# Patient Record
Sex: Female | Born: 1985
Health system: Southern US, Community
[De-identification: ages and names within clinical notes are randomized; demographics above are authoritative.]

## PROBLEM LIST (undated history)

## (undated) DIAGNOSIS — I10 Essential (primary) hypertension: Secondary | ICD-10-CM

## (undated) DIAGNOSIS — Z9851 Tubal ligation status: Secondary | ICD-10-CM

## (undated) DIAGNOSIS — Z331 Pregnant state, incidental: Secondary | ICD-10-CM

## (undated) DIAGNOSIS — F32A Depression, unspecified: Secondary | ICD-10-CM

## (undated) DIAGNOSIS — J189 Pneumonia, unspecified organism: Secondary | ICD-10-CM

## (undated) DIAGNOSIS — L309 Dermatitis, unspecified: Secondary | ICD-10-CM

## (undated) DIAGNOSIS — N611 Abscess of the breast and nipple: Secondary | ICD-10-CM

## (undated) DIAGNOSIS — Z302 Encounter for sterilization: Secondary | ICD-10-CM

## (undated) DIAGNOSIS — Z8489 Family history of other specified conditions: Secondary | ICD-10-CM

## (undated) DIAGNOSIS — F329 Major depressive disorder, single episode, unspecified: Secondary | ICD-10-CM

## (undated) HISTORY — PX: INCISE AND DRAIN ABCESS: PRO64

## (undated) HISTORY — PX: TUBAL LIGATION: SHX77

## (undated) HISTORY — DX: Abscess of the breast and nipple: N61.1

---
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
## 2005-04-04 ENCOUNTER — Ambulatory Visit: Payer: Self-pay | Admitting: Family Medicine

## 2005-05-23 ENCOUNTER — Ambulatory Visit: Payer: Self-pay | Admitting: Family Medicine

## 2005-10-02 ENCOUNTER — Ambulatory Visit: Payer: Self-pay | Admitting: Family Medicine

## 2006-02-09 ENCOUNTER — Emergency Department (HOSPITAL_COMMUNITY): Admission: EM | Admit: 2006-02-09 | Discharge: 2006-02-10 | Payer: Self-pay | Admitting: Emergency Medicine

## 2006-03-01 DIAGNOSIS — L309 Dermatitis, unspecified: Secondary | ICD-10-CM

## 2006-03-01 DIAGNOSIS — J45909 Unspecified asthma, uncomplicated: Secondary | ICD-10-CM

## 2006-03-01 DIAGNOSIS — L2084 Intrinsic (allergic) eczema: Secondary | ICD-10-CM | POA: Insufficient documentation

## 2006-03-02 ENCOUNTER — Encounter (INDEPENDENT_AMBULATORY_CARE_PROVIDER_SITE_OTHER): Payer: Self-pay | Admitting: *Deleted

## 2006-04-04 ENCOUNTER — Ambulatory Visit: Payer: Self-pay | Admitting: Sports Medicine

## 2006-04-04 DIAGNOSIS — M2240 Chondromalacia patellae, unspecified knee: Secondary | ICD-10-CM

## 2006-04-04 DIAGNOSIS — M25569 Pain in unspecified knee: Secondary | ICD-10-CM | POA: Insufficient documentation

## 2006-07-04 ENCOUNTER — Emergency Department: Payer: Self-pay

## 2006-07-28 ENCOUNTER — Emergency Department: Payer: Self-pay

## 2006-10-24 ENCOUNTER — Telehealth (INDEPENDENT_AMBULATORY_CARE_PROVIDER_SITE_OTHER): Payer: Self-pay | Admitting: *Deleted

## 2006-12-14 ENCOUNTER — Telehealth: Payer: Self-pay | Admitting: *Deleted

## 2007-06-18 ENCOUNTER — Emergency Department (HOSPITAL_COMMUNITY): Admission: EM | Admit: 2007-06-18 | Discharge: 2007-06-18 | Payer: Self-pay | Admitting: Emergency Medicine

## 2007-07-19 ENCOUNTER — Telehealth: Payer: Self-pay | Admitting: Family Medicine

## 2007-08-05 ENCOUNTER — Ambulatory Visit: Payer: Self-pay | Admitting: Family Medicine

## 2008-08-22 ENCOUNTER — Emergency Department (HOSPITAL_COMMUNITY): Admission: EM | Admit: 2008-08-22 | Discharge: 2008-08-22 | Payer: Self-pay | Admitting: Emergency Medicine

## 2009-03-07 ENCOUNTER — Emergency Department (HOSPITAL_COMMUNITY): Admission: EM | Admit: 2009-03-07 | Discharge: 2009-03-07 | Payer: Self-pay | Admitting: Emergency Medicine

## 2009-09-01 ENCOUNTER — Encounter: Payer: Self-pay | Admitting: Family Medicine

## 2010-02-03 ENCOUNTER — Encounter: Payer: Self-pay | Admitting: *Deleted

## 2010-02-03 NOTE — Miscellaneous (Signed)
   Clinical Lists Changes  Problems: Changed problem from ASTHMA, UNSPECIFIED (ICD-493.90) to ASTHMA, INTERMITTENT (ICD-493.90) 

## 2010-03-21 ENCOUNTER — Ambulatory Visit (INDEPENDENT_AMBULATORY_CARE_PROVIDER_SITE_OTHER): Payer: Self-pay | Admitting: Family Medicine

## 2010-03-21 ENCOUNTER — Encounter: Payer: Self-pay | Admitting: Family Medicine

## 2010-03-21 DIAGNOSIS — J45909 Unspecified asthma, uncomplicated: Secondary | ICD-10-CM

## 2010-03-21 MED ORDER — ALBUTEROL SULFATE HFA 108 (90 BASE) MCG/ACT IN AERS: 2.0000 | INHALATION_SPRAY | Freq: Four times a day (QID) | RESPIRATORY_TRACT | Status: DC | PRN

## 2010-03-21 NOTE — Patient Instructions (Addendum)
If you are using your inhaler every day you should call us for a prescription for a controller You need a Pap smear to check for cervical cancer.  Often hospitals have free screenings You can lower your risk of heart disease by controlling your weight and with regular exercise

## 2010-03-21 NOTE — Assessment & Plan Note (Signed)
Currently seems to be mild intermittent,  Use rescue inhaler prn.   Discussed when might need prevention meds

## 2010-03-21 NOTE — Progress Notes (Signed)
  Subjective:    Patient ID: [name] [name], female    DOB: 03/12/1985, 25 y.o.   MRN: [id number]  HPI  Asthma Using Primatene mist a few times a week.  Has topped smoking.  Symptoms are mainly in early AM.  No exercise intolerance or problems sleeping No edema   Review of Systems see above      Objective:   Physical Exam   Lungs:  Normal respiratory effort, chest expands symmetrically. Lungs are clear to auscultation, no crackles or wheezes. Heart:  Normal rate and regular rhythm. S1 and S2 normal without gallop, murmur, click, rub or other extra sounds Extremities:  No cyanosis, edema, or deformity noted with good range of motion of all major joints.       Assessment & Plan:

## 2010-04-09 LAB — POCT I-STAT, CHEM 8
BUN: <3 mg/dL — ABNORMAL LOW (ref 6–23)
Chloride: 103 mEq/L (ref 96–112)
Creatinine, Ser: 0.7 mg/dL (ref 0.4–1.2)
Sodium: 139 mEq/L (ref 135–145)
TCO2: 24 mmol/L (ref 0–100)

## 2010-04-09 LAB — URINALYSIS, ROUTINE W REFLEX MICROSCOPIC
Glucose, UA: NEGATIVE mg/dL
Ketones, ur: NEGATIVE mg/dL
Specific Gravity, Urine: 1.011 (ref 1.005–1.030)
pH: 7 (ref 5.0–8.0)

## 2010-04-09 LAB — URINE MICROSCOPIC-ADD ON

## 2010-05-20 NOTE — Discharge Summary (Signed)
[name] [name], [name] [name]              ACCOUNT NO.:  [id number]   MEDICAL RECORD NO.:  [id number]          PATIENT TYPE:  INP   LOCATION:  5707                         FACILITY:  [facility]   PHYSICIAN:  Santiago Bumpers. Hensel, M.D.DATE OF BIRTH:  Oct 23, 1985   DATE OF ADMISSION:  11/19/2004  DATE OF DISCHARGE:  11/21/2004                                 DISCHARGE SUMMARY   DISCHARGE DIAGNOSES:  1.  Community acquired pneumonia.  2.  Asthma.   DISCHARGE MEDICATIONS:  1.  Avelox 400 mg one tablet p.o. daily for 10 days.  2.  Prednisone 50 mg one tablet p.o. daily for 6 days.  3.  Albuterol metered dose inhaler 2 puffs every 4 hours for two days, then      two puffs every 6 hours for one day and then two puffs q.4h. PRN.  4.  Tussionex 5 mL q.12h.  5.  Motrin 600 mg one tablet p.o. q.6h. PRN.  6.  Singulair 10 mg one tablet p.o. daily.  7.  Flovent 110 mcg/spray, one puff b.i.d.   FOLLOW UP:  The patient has a follow up appointment with Dr. Deirdre Priest of  [facility].   BRIEF HISTORY, PHYSICAL EXAMINATION AND LABORATORY DATA:  On admission, 25-  year-old white female with history of asthma, atopic dermatitis who came to  the emergency department complaining of increased shortness of breath/work  of breathing and wheezing.  The patient stated that she started with upper  respiratory tract symptoms, (sore throat, runny nose) on November 15, 2004.  By November 16, 2004 the patient was requiring albuterol every 2 to 3 hours  with no improvement of shortness of breath and wheezing.  The patient went  to Urgent Care and received respiratory treatments, oral prednisone was  prescribed and she was discharged hoe.  24 hours later, symptoms worsened.  The patient returned to Urgent Care and a chest x-ray showed right lower  lobe pneumonia.  The patient was placed on Cipro. On the day of admission  the patient had not improved and was complaining of increased shortness of  breath/work  of breathing.  The patient stated on the last admission for  acute exacerbation of asthma was approximately 5 years ago.  The patient has  no medical insurance until two months ago that she qualify for [employer] [facility].  For the last 10 months the patient has not been using steroid inhaler  secondary to not being able to afford it.  After she qualified for [employer], the patient was prescribed Singulair but not prescribed Advair which,  per patient and the patient's mother, was the best treatment to control this  patient's asthma.   On physical examination on admission the patient was afebrile, pulse 122,  respiratory rate 28, blood pressure 124/72, oxygen saturation 91% on room  air.  The patient was in no acute distress.  On respiratory examination the  patient presented decreased breath sounds on right lower lobe and left lower  lobe (right greater than left), bibasilar crackles, no wheezing, increased  expiratory time.  Cardiovascular tachycardic.  The rest of  the patient's  physical examination was unremarkable   Laboratory data on admission showed chest x-ray with worsening infiltrates  on right lower lobes and bilateral upper lobes.  White blood cell count was  20.8, hemoglobin 14.6, hematocrit 42.5, platelet count 365,000.  Basic  metabolic profile showed sodium 139, potassium 4.1, chloride 107, pCO2 28,  BUN 8, creatinine 0.8, glucose 79.   HOSPITAL COURSE:  PROBLEM #1:  PNEUMONIA:  Cipro was discontinued.  The  patient was started on Rocephin 1 gram intravenously q.24h. and Zithromax  500 mg intravenously x1, then 250 mg daily for 4 days.  Throughout the rest  of her hospitalization her shortness of breath and increased work of  breathing improved remarkably.  Oxygen saturations were stable ranging from  92% to 94% on room air.  By the day of discharge the patient was walking in  the hall.  Oxygen saturations were checked and peak flow's before and after  respiratory  treatments were recorded.  By November 21, 2004 in the morning  the patient is clinically stable.  Peak flow's will be checked before and  after treatments during the afternoon and the patient will walk in the hall  with checks of oxygen saturation's.  If the patient is stable, discharge  home on Avelox 400 mg one tablet p.o. daily to complete a course of  antibiotics of 10 days.  The patient has a follow up appointment with Dr.  Deirdre Priest on December 01, 2004.  A repeat chest x-ray will be performed in  four to six weeks for follow up.   PROBLEM #2:  ASTHMA, MODERATELY PERSISTENT:  The patient was treated with  albuterol q.4h./q.2h. PRN nebulization's.  The patient received 24 hours of  Solu-Medrol, 60 mg intravenously q.6h. for 24 hours, then intravenously  steroids were discontinued and the patient was started on prednisone 40 mg  p.o. daily. The patient is to complete 10 day course of steroids.  Her  shortness of breath and work of breathing improved remarkably.  On physical  examination still wheezes are noticed.  The patient's oxygen saturation's  were stable.  Peak flow's before and after respiratory treatments were  ordered and the patient will walk in the halls with checks of oxygen  saturation's.  If Peak flow shows improvement and oxygen saturation's are  stable, the patient will be discharged home this afternoon.  The patient  agrees with this plan.  The patient received asthma teaching.  The patient  has a follow up appointment with Dr. Deirdre Priest of [facility].   PROBLEM #3:  SOCIAL:  The patient did not have insurance until finally she  qualified for [employer] two months ago.  Per the patient and the  patient's mother, they state that Flovent or Advair help very well to  control her asthma, but the patient was unable to afford it.  Since the  patient qualified for [employer] [facility] she has not been prescribed either of these medications.  I have ordered  asthma teaching and Flovent 110  mcg/spray.  The patient is going to be able to take home a sample of  Flovent; Social worker for discharge medications.  This issue to be followed  by primary physician.   NOTE:  This patient's asthma is triggered by cold weather.  In general, the  patient has acute exacerbation of  her asthma during the month's of  November, December and January.  Given the fact that this patient is unable  to  afford her medications, she is not being prescribed Flovent or Advair at  [facility] [place], she is not able to afford these medications.  This has been  since the last 10 months.  For the last 10 months the patient has had two  visits to the Urgent Care secondary to asthma exacerbation's.  The patient  left the hospital with a sample of Flovent.  This issue is to be followed by  her primary physician's.      Henri Medal, MD    ______________________________  Santiago Bumpers Leveda Anna, [name]    Lendon Colonel  D:  11/21/2004  T:  11/21/2004  Job:  [id number]   cc:   Pearlean Brownie, [name]  Fax: [phone]

## 2010-06-10 ENCOUNTER — Emergency Department (HOSPITAL_COMMUNITY)
Admission: EM | Admit: 2010-06-10 | Discharge: 2010-06-11 | Disposition: A | Discharge status: Home / Self Care | Payer: Medicaid Other | Attending: Emergency Medicine | Admitting: Emergency Medicine

## 2010-06-10 DIAGNOSIS — J45909 Unspecified asthma, uncomplicated: Secondary | ICD-10-CM | POA: Insufficient documentation

## 2010-06-10 DIAGNOSIS — R05 Cough: Secondary | ICD-10-CM | POA: Insufficient documentation

## 2010-06-10 DIAGNOSIS — O99891 Other specified diseases and conditions complicating pregnancy: Secondary | ICD-10-CM | POA: Insufficient documentation

## 2010-06-10 DIAGNOSIS — R059 Cough, unspecified: Secondary | ICD-10-CM | POA: Insufficient documentation

## 2010-06-10 DIAGNOSIS — R000 Tachycardia, unspecified: Secondary | ICD-10-CM | POA: Insufficient documentation

## 2010-06-10 LAB — POCT PREGNANCY, URINE: Preg Test, Ur: POSITIVE

## 2010-06-11 ENCOUNTER — Emergency Department (HOSPITAL_COMMUNITY)
Admission: EM | Admit: 2010-06-11 | Discharge: 2010-06-12 | Disposition: A | Discharge status: Home / Self Care | Payer: Medicaid Other | Attending: Emergency Medicine | Admitting: Emergency Medicine

## 2010-06-11 DIAGNOSIS — J45909 Unspecified asthma, uncomplicated: Secondary | ICD-10-CM | POA: Insufficient documentation

## 2010-06-11 DIAGNOSIS — R0609 Other forms of dyspnea: Secondary | ICD-10-CM | POA: Insufficient documentation

## 2010-06-11 DIAGNOSIS — J40 Bronchitis, not specified as acute or chronic: Secondary | ICD-10-CM | POA: Insufficient documentation

## 2010-06-11 DIAGNOSIS — R0989 Other specified symptoms and signs involving the circulatory and respiratory systems: Secondary | ICD-10-CM | POA: Insufficient documentation

## 2010-06-11 DIAGNOSIS — R05 Cough: Secondary | ICD-10-CM | POA: Insufficient documentation

## 2010-06-11 DIAGNOSIS — R079 Chest pain, unspecified: Secondary | ICD-10-CM | POA: Insufficient documentation

## 2010-06-11 DIAGNOSIS — O9989 Other specified diseases and conditions complicating pregnancy, childbirth and the puerperium: Secondary | ICD-10-CM | POA: Insufficient documentation

## 2010-06-11 DIAGNOSIS — R0682 Tachypnea, not elsewhere classified: Secondary | ICD-10-CM | POA: Insufficient documentation

## 2010-06-11 DIAGNOSIS — R059 Cough, unspecified: Secondary | ICD-10-CM | POA: Insufficient documentation

## 2010-06-11 LAB — URINALYSIS, ROUTINE W REFLEX MICROSCOPIC
Ketones, ur: NEGATIVE mg/dL
Leukocytes, UA: NEGATIVE
Nitrite: NEGATIVE
Protein, ur: NEGATIVE mg/dL
Urobilinogen, UA: 1 mg/dL (ref 0.0–1.0)
pH: 6 (ref 5.0–8.0)

## 2010-08-09 LAB — ABO/RH

## 2010-08-09 LAB — HIV ANTIBODY (ROUTINE TESTING W REFLEX): HIV: NONREACTIVE

## 2010-08-09 LAB — GC/CHLAMYDIA PROBE AMP, GENITAL: Gonorrhea: NEGATIVE

## 2010-08-09 LAB — STREP B DNA PROBE: GBS: POSITIVE

## 2010-08-09 LAB — RPR: RPR: NONREACTIVE

## 2010-08-09 LAB — ANTIBODY SCREEN: Antibody Screen: NEGATIVE

## 2010-08-10 ENCOUNTER — Other Ambulatory Visit (HOSPITAL_COMMUNITY): Payer: Self-pay | Admitting: Obstetrics and Gynecology

## 2010-08-10 DIAGNOSIS — Z3682 Encounter for antenatal screening for nuchal translucency: Secondary | ICD-10-CM

## 2010-08-15 ENCOUNTER — Ambulatory Visit (HOSPITAL_COMMUNITY)
Admission: RE | Admit: 2010-08-15 | Discharge: 2010-08-15 | Disposition: A | Discharge status: Home / Self Care | Payer: Medicaid Other | Source: Ambulatory Visit | Attending: Obstetrics and Gynecology | Admitting: Obstetrics and Gynecology

## 2010-08-15 ENCOUNTER — Encounter (HOSPITAL_COMMUNITY): Payer: Self-pay

## 2010-08-15 DIAGNOSIS — Z3682 Encounter for antenatal screening for nuchal translucency: Secondary | ICD-10-CM

## 2010-08-15 DIAGNOSIS — O351XX0 Maternal care for (suspected) chromosomal abnormality in fetus, not applicable or unspecified: Secondary | ICD-10-CM | POA: Insufficient documentation

## 2010-08-15 DIAGNOSIS — Z3689 Encounter for other specified antenatal screening: Secondary | ICD-10-CM | POA: Insufficient documentation

## 2010-08-15 DIAGNOSIS — O3510X0 Maternal care for (suspected) chromosomal abnormality in fetus, unspecified, not applicable or unspecified: Secondary | ICD-10-CM | POA: Insufficient documentation

## 2010-08-31 ENCOUNTER — Other Ambulatory Visit: Payer: Self-pay | Admitting: Maternal and Fetal Medicine

## 2010-08-31 ENCOUNTER — Ambulatory Visit (HOSPITAL_COMMUNITY)
Admission: RE | Admit: 2010-08-31 | Discharge: 2010-08-31 | Disposition: A | Discharge status: Home / Self Care | Payer: Medicaid Other | Source: Ambulatory Visit | Attending: Obstetrics and Gynecology | Admitting: Obstetrics and Gynecology

## 2010-08-31 DIAGNOSIS — O3510X0 Maternal care for (suspected) chromosomal abnormality in fetus, unspecified, not applicable or unspecified: Secondary | ICD-10-CM | POA: Insufficient documentation

## 2010-08-31 DIAGNOSIS — O351XX0 Maternal care for (suspected) chromosomal abnormality in fetus, not applicable or unspecified: Secondary | ICD-10-CM | POA: Insufficient documentation

## 2010-10-05 ENCOUNTER — Ambulatory Visit (INDEPENDENT_AMBULATORY_CARE_PROVIDER_SITE_OTHER): Payer: Medicaid Other | Admitting: Family Medicine

## 2010-10-05 ENCOUNTER — Encounter: Payer: Self-pay | Admitting: Family Medicine

## 2010-10-05 ENCOUNTER — Telehealth: Payer: Self-pay | Admitting: Family Medicine

## 2010-10-05 ENCOUNTER — Other Ambulatory Visit: Payer: Self-pay | Admitting: Family Medicine

## 2010-10-05 DIAGNOSIS — J45909 Unspecified asthma, uncomplicated: Secondary | ICD-10-CM

## 2010-10-05 MED ORDER — ALBUTEROL 90 MCG/ACT IN AERS: 2.0000 | INHALATION_SPRAY | Freq: Four times a day (QID) | RESPIRATORY_TRACT | Status: DC | PRN

## 2010-10-05 MED ORDER — PREDNISONE 20 MG PO TABS: 40.0000 mg | ORAL_TABLET | Freq: Every day | ORAL | Status: AC

## 2010-10-05 NOTE — Telephone Encounter (Signed)
Pt's cough/asthma was getting worse so she called back and is coming in at 1:30.

## 2010-10-05 NOTE — Telephone Encounter (Signed)
Refill request

## 2010-10-05 NOTE — Patient Instructions (Signed)
Great job on quitting smoking!!!!!! Benadryl or claritin or zyrtec are safe allergy medicines to usei n pregnancy  Asthma, Adult Asthma is caused by narrowing of the air passages in the lungs. It may be triggered by pollen, dust, animal dander, molds, some foods, respiratory infections, exposure to smoke, exercise, emotional stress or other allergens (things that cause allergic reactions or allergies). Repeat attacks are common. HOME CARE INSTRUCTIONS  Use prescription medications as ordered by your caregiver.   Avoid pollen, dust, animal dander, molds, smoke and other things that cause attacks at home and at work.   You may have fewer attacks if you decrease dust in your home. Electrostatic air cleaners may help.   It may help to replace your pillows or mattress with materials less likely to cause allergies.   Talk to your caregiver about an action plan for managing asthma attacks at home, including, the use of a peak flow meter which measures the severity of your asthma attack. An action plan can help minimize or stop the attack without having to seek medical care.   If you are not on a fluid restriction, drink 8 to 10 glasses of water each day.   Always have a plan prepared for seeking medical attention, including, calling your physician, accessing local emergency care, and calling 911 (in the [place] for a severe attack.   Discuss possible exercise routines with your caregiver.   If animal dander is the cause of asthma, you may need to get rid of pets.  SEEK MEDICAL CARE IF:  You have wheezing and shortness of breath even if taking medicine to prevent attacks.   An oral temperature above 101 develops.   You have muscle aches, chest pain or thickening of sputum.   Your sputum changes from clear or white to yellow, green, gray or bloody.   You have any problems that may be related to the medicine you are taking (such as a rash, itching, swelling or trouble breathing).  SEEK  IMMEDIATE MEDICAL CARE IF:  Your usual medicines do not stop your wheezing or there is increased coughing and/or shortness of breath.   You have increased difficulty breathing.   You have an oral temperature above 101, not controlled by medicine.  MAKE SURE YOU:  Understand these instructions.   Will watch your condition.   Will get help right away if you are not doing well or get worse.  Document Released: 12/19/2004 Document Re-Released: 01/10/2009 [facility] Patient Information 2011 [place], [place].

## 2010-10-05 NOTE — Progress Notes (Signed)
  Subjective:    Patient ID: Dominik Lauricella, female    DOB: 04/28/1985, 25 y.o.   MRN: [id number]  HPI  25 yo @ 20 weeks of gestation here for work in appt for 1-2 days of dyspnea and wheezing.  Has asthma, intermittent.  This is her typical season for flares.  Last one 5 months ago- got prednisone.  No intubation, hospitalizations.  Has not needed any albuterol in between.  For past 1-2 days,has been usuing albuterol every few hours.  No fever, no sputum.  + cough, rhinorrhea, dyspnea. Review of Systems     Objective:   Physical Exam GEN: NAD, gravid. CV:  Regular Rate & Rhythm, no murmur Respiratory:  Normal work of breathing, wheezes throughout.  Fair air movement. HEENT: [place]/AT. EOMI, PERRLA, no conjunctival injection or scleral icterus.  Bilateral tympanic membranes intact without erythema or effusion.  .  Nares without edema or rhinorrhea.  Oropharynx is without erythema or exudates.  No anterior or posterior cervical lymphadenopathy.         Assessment & Plan:

## 2010-10-05 NOTE — Assessment & Plan Note (Signed)
Will refill albuterol, 5 day course of prednisone.   Pregnancy has not had much impact on her asthma thus far.  Discussed need for return if no improvement from this acute episode (likely triggered by viral uri) or if notes increase in baseline dyspnea and needing to use albuterol more frequently.

## 2010-10-05 NOTE — Telephone Encounter (Signed)
Is in need of her inhaler (albuterol) and when she got it last time, she could only afford 1 weeks worth and now they are telling her that she will need a new rx.  She is having hard time breathing today and is asking for it asap. [employer], [address] [employer]

## 2010-10-06 ENCOUNTER — Ambulatory Visit (INDEPENDENT_AMBULATORY_CARE_PROVIDER_SITE_OTHER): Payer: Medicaid Other | Admitting: Family Medicine

## 2010-10-06 ENCOUNTER — Telehealth: Payer: Self-pay | Admitting: Family Medicine

## 2010-10-06 VITALS — BP 120/76 | HR 110 | Temp 98.7°F | Wt 158.6 lb

## 2010-10-06 DIAGNOSIS — J45901 Unspecified asthma with (acute) exacerbation: Secondary | ICD-10-CM | POA: Insufficient documentation

## 2010-10-06 MED ORDER — CETIRIZINE HCL 10 MG PO TABS: 10.0000 mg | ORAL_TABLET | Freq: Every day | ORAL | Status: DC

## 2010-10-06 NOTE — Assessment & Plan Note (Addendum)
Although patient states that her symptoms do not seem to be improving, lungs clearer on physical exam. Patient not in any acute distress. Pulse ox 98% on room air. Good air movement to bases bilateral-with a few scattered wheezes on expiration. Patient to continue prednisone as directed, patient to use albuterol as directed-every 2-4 hours for the first 24 hours, then every 6 hours for the next 24 hours, then as needed. Patient also to take Zyrtec daily. Heart rate 110 today-this is most likely from albuterol. Patient to drink lots of fluids. Patient to return tomorrow for recheck before the weekend.  Discuss with patient that sometimes asthma is worsened while pregnant. We'll need to watch patient closely. Patient states understanding of plan.

## 2010-10-06 NOTE — Telephone Encounter (Signed)
Advised patient to come to office now.

## 2010-10-06 NOTE — Progress Notes (Addendum)
  Subjective:    Patient ID: [name] [name], female    DOB: 1985/03/21, 25 y.o.   MRN: [id number]  HPI Asthma exacerbation: Patient states she was seen yesterday for asthma exacerbation, she states she is not feeling much better. Came in today for recheck. Patient states she is using albuterol inhaler every 2 hours. Was waking up at night every 2-3 hours to use albuterol inhaler. Symptoms seem to be worse at night. Also took a dose of Benadryl last night and this helps nasal congestion. Took first dose of prednisone yesterday at 1:30 PM, therefore has had less than 24 hours since first dose of prednisone. No fever. Runny nose improved. No nausea vomiting. Positive sore throat. No ear pain. Positive body aches.  Review of Systems As per above. Baby moving well. No vaginal bleeding. No abdominal pain    Objective:   Physical Exam  Constitutional: She is oriented to person, place, and time. She appears well-developed and well-nourished. No distress.  HENT:  Head: Normocephalic and atraumatic.  Eyes: Right eye exhibits no discharge. Left eye exhibits no discharge.  Pulmonary/Chest: Effort normal. No respiratory distress. She has wheezes.       Good air movement bilateral, a few scattered wheezes on expiration, but good air movement to bases.  Musculoskeletal: She exhibits no edema.  Neurological: She is alert and oriented to person, place, and time.  Skin: No rash noted.  Psychiatric: She has a normal mood and affect. Her behavior is normal.          Assessment & Plan:

## 2010-10-06 NOTE — Telephone Encounter (Signed)
Pt was here yesterday and states that her condition is worse.  Wants to know what she should do.

## 2010-10-06 NOTE — Patient Instructions (Addendum)
Take zyrtec daily- you can by over the counter. Albuterol every 2-4 hours for the next 24 hours, then every 6 hours for the next 24 hours, then as needed.  Continue to use prednisone. Drink lots of fluids.  Come back tomorrow for recheck

## 2010-10-07 ENCOUNTER — Ambulatory Visit: Payer: Medicaid Other | Admitting: Family Medicine

## 2010-10-08 ENCOUNTER — Telehealth: Payer: Self-pay | Admitting: Family Medicine

## 2010-10-08 ENCOUNTER — Inpatient Hospital Stay (HOSPITAL_COMMUNITY)
Admission: RE | Admit: 2010-10-08 | Discharge: 2010-10-08 | Disposition: A | Discharge status: Home / Self Care | Payer: Medicaid Other | Source: Ambulatory Visit | Attending: Family Medicine | Admitting: Family Medicine

## 2010-10-08 NOTE — Telephone Encounter (Signed)
Called because not feeling better from recent asthma exacerbation. Total 6 days of symptoms still feeling quite SOB when walking across room. Using albuterol q2 hours. Finished prednisone 2 days ago. Nonproductive cough, wheezing. Denies fever, CP, sputum. Last hospitalization for asthma was 10 years ago. Discussed options of being evaluated at [facility] or waiting for work in appointment Monday morning, but since I am unable to evaluate her lung exam, I advised being seen. May need a prolonged course of prednisone vs antibiotic depending on exam (?bronchitis, atypical pna?). [facility]. Advised her to follow up on Monday even if she goes to urgent care today.

## 2010-11-07 ENCOUNTER — Emergency Department (INDEPENDENT_AMBULATORY_CARE_PROVIDER_SITE_OTHER)
Admission: EM | Admit: 2010-11-07 | Discharge: 2010-11-07 | Disposition: A | Discharge status: Home / Self Care | Payer: Medicaid Other | Source: Home / Self Care | Attending: Emergency Medicine | Admitting: Emergency Medicine

## 2010-11-07 ENCOUNTER — Encounter (HOSPITAL_COMMUNITY): Payer: Self-pay

## 2010-11-07 DIAGNOSIS — L309 Dermatitis, unspecified: Secondary | ICD-10-CM

## 2010-11-07 DIAGNOSIS — L259 Unspecified contact dermatitis, unspecified cause: Secondary | ICD-10-CM

## 2010-11-07 DIAGNOSIS — H01139 Eczematous dermatitis of unspecified eye, unspecified eyelid: Secondary | ICD-10-CM

## 2010-11-07 HISTORY — DX: Pregnant state, incidental: Z33.1

## 2010-11-07 MED ORDER — PREDNISONE (PAK) 10 MG PO TABS: 10.0000 mg | ORAL_TABLET | ORAL | Status: DC

## 2010-11-07 MED ORDER — PREDNISONE (PAK) 10 MG PO TABS: 10.0000 mg | ORAL_TABLET | Freq: Four times a day (QID) | ORAL | Status: DC

## 2010-11-07 MED ORDER — PREDNISONE (PAK) 10 MG PO TABS: 10.0000 mg | ORAL_TABLET | Freq: Three times a day (TID) | ORAL | Status: DC

## 2010-11-07 MED ORDER — TRIAMCINOLONE ACETONIDE 0.1 % EX CREA: TOPICAL_CREAM | Freq: Two times a day (BID) | CUTANEOUS | Status: DC

## 2010-11-07 MED ORDER — PREDNISONE (PAK) 10 MG PO TABS: 20.0000 mg | ORAL_TABLET | Freq: Every evening | ORAL | Status: DC

## 2010-11-07 MED ORDER — PREDNISONE 10 MG PO TABS: 20.0000 mg | ORAL_TABLET | Freq: Every day | ORAL | Status: AC

## 2010-11-07 MED ORDER — PREDNISONE 20 MG PO TABS: 20.0000 mg | ORAL_TABLET | Freq: Two times a day (BID) | ORAL | Status: DC

## 2010-11-07 MED ORDER — PREDNISONE (PAK) 10 MG PO TABS: 20.0000 mg | ORAL_TABLET | Freq: Every morning | ORAL | Status: DC

## 2010-11-07 NOTE — ED Provider Notes (Addendum)
History     CSN: [id number] Arrival date & time: 11/07/2010  3:01 PM   First MD Initiated Contact with Patient 11/07/10 1608      Chief Complaint  Patient presents with  . Rash    (Consider location/radiation/quality/duration/timing/severity/associated sxs/prior treatment) Patient is a 25 y.o. female presenting with rash.  Rash  The current episode started more than 1 week ago. The problem has not changed since onset.The problem is associated with an unknown factor. There has been no fever. The rash is present on the face, right arm and left arm. The patient is experiencing no pain. The pain has been worsening since onset. Associated symptoms include itching. Pertinent negatives include no blisters. She has tried antihistamines for the symptoms. The treatment provided no relief.    Past Medical History  Diagnosis Date  . Asthma   . Pregnant state, incidental     History reviewed. No pertinent past surgical history.  Family History  Problem Relation Age of Onset  . Healthy Mother   . Healthy Father     History  Substance Use Topics  . Smoking status: Former Games developer  . Smokeless tobacco: Not on file  . Alcohol Use: No    OB History    Grav Para Term Preterm Abortions TAB SAB Ect Mult Living   1               Review of Systems  Constitutional: Negative for activity change.  HENT: Negative for sore throat.   Eyes: Positive for redness and itching.  Respiratory: Negative for cough and shortness of breath.   Skin: Positive for itching and rash.    Allergies  Review of patient's allergies indicates no known allergies.  Home Medications   Current Outpatient Rx  Name Route Sig Dispense Refill  . ALBUTEROL 90 MCG/ACT IN AERS Inhalation Inhale 2 puffs into the lungs every 6 (six) hours as needed for wheezing. 17 g 2  . CETIRIZINE HCL 10 MG PO TABS Oral Take 1 tablet (10 mg total) by mouth daily. 30 tablet   . PREDNISONE 10 MG PO TABS Oral Take 2 tablets (20 mg  total) by mouth daily. 15 tablet 0  . PRENATAL VITAMINS (DIS) PO TABS Oral Take by mouth.      . TRIAMCINOLONE ACETONIDE 0.1 % EX CREA Topical Apply topically 2 (two) times daily. 30 g 0    BP 130/59  Pulse 102  Temp(Src) 98.7 F (37.1 C) (Oral)  Resp 16  SpO2 99%  LMP 04/26/2010  Physical Exam  Skin: Skin is dry. Rash noted. No abrasion noted. Rash is papular and urticarial. There is erythema.       ED Course  Procedures (including critical care time)  Labs Reviewed - No data to display No results found.   1. Eczema of eyelid   2. Eczematous dermatitis       MDM  Marked eczema- patioent with history and predominanatly periorbital rash        Jimmie Molly 11/07/10 1645  Jakelin Taussig 11/07/10 1648

## 2010-11-07 NOTE — ED Notes (Signed)
C/o rash inner lip for 1 week, now has rash legs, face; c/o itching all over, minimal relief w benadryl ; [redacted] wk PREGNANT

## 2010-11-19 ENCOUNTER — Other Ambulatory Visit: Payer: Self-pay | Admitting: Family Medicine

## 2010-11-21 NOTE — Telephone Encounter (Signed)
Refill request

## 2010-11-30 ENCOUNTER — Ambulatory Visit (INDEPENDENT_AMBULATORY_CARE_PROVIDER_SITE_OTHER): Payer: Medicaid Other | Admitting: Family Medicine

## 2010-11-30 ENCOUNTER — Telehealth: Payer: Self-pay | Admitting: *Deleted

## 2010-11-30 ENCOUNTER — Encounter: Payer: Self-pay | Admitting: Family Medicine

## 2010-11-30 VITALS — BP 126/75 | HR 125 | Temp 98.4°F | Ht 60.0 in | Wt 167.8 lb

## 2010-11-30 DIAGNOSIS — J45901 Unspecified asthma with (acute) exacerbation: Secondary | ICD-10-CM

## 2010-11-30 MED ORDER — BUDESONIDE 180 MCG/ACT IN AEPB: 1.0000 | INHALATION_SPRAY | Freq: Two times a day (BID) | RESPIRATORY_TRACT | Status: DC

## 2010-11-30 NOTE — Telephone Encounter (Signed)
Ms. Gracelyn Nurse is calling about whether she will be able to get her medication today.

## 2010-11-30 NOTE — Telephone Encounter (Signed)
PA required for [employer]. Form filled out by MD and faxed to Medicaid.

## 2010-11-30 NOTE — Progress Notes (Signed)
  Subjective:    Patient ID: [name] [name], female    DOB: 03-30-1985, 25 y.o.   MRN: [id number]  HPI  URI Asthma For last week dry cough, runny nose, wheezing and mild shortness of breath.  No fever or leg swelling. No pregnancy problems so far.  Using albuterol qid at least.  Had a prednisone burst last time in ER about 2 weeks ago.  Has not been on inhaled steroid for long time.     Review of Symptoms - see HPI  PMH - Smoking status noted.   No recent hospitilizations for asthma    Review of Systems     Objective:   Physical Exam  Alert no acute distress Lungs:  Normal respiratory effort, chest expands symmetrically bilateral mild wheeze No dullness Heart - Regular rate and rhythm.  No murmurs, gallops or rubs.    Abdomen - gravid Extremities:  No cyanosis, edema, or deformity noted with good range of motion of all major joints.   Skin - dry no rash       Assessment & Plan:

## 2010-11-30 NOTE — Patient Instructions (Signed)
Use the new inhaler 2 puffs twice daily with a spacer for 1 week then 1 puff twice daily Wash mouth well after using  If are feeling worse then call   If not better in one week call  Use the albuterol as needed

## 2010-11-30 NOTE — Assessment & Plan Note (Signed)
Recurrent but not severe.  Will start inhaled steroid at high dose then resume normal dose. Budesonide is pregnancy category B.   Watch closely for worsening.

## 2010-11-30 NOTE — Telephone Encounter (Signed)
Advised that it will probably be a couple of days before we hear back from [facility].  Possibly tomorrow. She asks if Dr. Deirdre Priest will give her prednisone to get her thru until this is approved.  Will check with Dr. Deirdre Priest tomorrow AM.

## 2010-12-01 MED ORDER — PREDNISONE 10 MG PO TABS: 30.0000 mg | ORAL_TABLET | Freq: Every day | ORAL | Status: AC

## 2010-12-01 NOTE — Telephone Encounter (Signed)
LMOVM for a callback. [name] [name], [name] [name]

## 2010-12-01 NOTE — Telephone Encounter (Signed)
Risk of worsening asthma outweighs risk of prednisone in pregnancy so sent in po prednisone  Please call her and let her know to start the oral prednisone and get the inhaler asap  Thanks  LC

## 2010-12-01 NOTE — Telephone Encounter (Signed)
Pt informed. [name] [name]  

## 2010-12-02 NOTE — Telephone Encounter (Addendum)
Approval received for Pulmicort , pharmacy and patient  notified.

## 2010-12-07 ENCOUNTER — Ambulatory Visit: Payer: Medicaid Other | Admitting: Family Medicine

## 2010-12-14 ENCOUNTER — Ambulatory Visit: Payer: Medicaid Other | Admitting: Family Medicine

## 2010-12-18 ENCOUNTER — Emergency Department (HOSPITAL_COMMUNITY)
Admission: EM | Admit: 2010-12-18 | Discharge: 2010-12-18 | Disposition: A | Discharge status: Home / Self Care | Payer: Medicaid Other | Attending: Emergency Medicine | Admitting: Emergency Medicine

## 2010-12-18 ENCOUNTER — Encounter (HOSPITAL_COMMUNITY): Payer: Self-pay | Admitting: *Deleted

## 2010-12-18 DIAGNOSIS — J45909 Unspecified asthma, uncomplicated: Secondary | ICD-10-CM | POA: Insufficient documentation

## 2010-12-18 DIAGNOSIS — O26899 Other specified pregnancy related conditions, unspecified trimester: Secondary | ICD-10-CM | POA: Insufficient documentation

## 2010-12-18 DIAGNOSIS — N644 Mastodynia: Secondary | ICD-10-CM | POA: Insufficient documentation

## 2010-12-18 LAB — POCT I-STAT, CHEM 8
Chloride: 107 mEq/L (ref 96–112)
Glucose, Bld: 80 mg/dL (ref 70–99)
HCT: 38 % (ref 36.0–46.0)
Hemoglobin: 12.9 g/dL (ref 12.0–15.0)
Potassium: 4.1 mEq/L (ref 3.5–5.1)
Sodium: 138 mEq/L (ref 135–145)

## 2010-12-18 LAB — CBC
Hemoglobin: 13.1 g/dL (ref 12.0–15.0)
MCH: 33.7 pg (ref 26.0–34.0)
MCHC: 34.5 g/dL (ref 30.0–36.0)
Platelets: 357 10*3/uL (ref 150–400)
RDW: 13.5 % (ref 11.5–15.5)

## 2010-12-18 LAB — DIFFERENTIAL
Basophils Absolute: 0.1 10*3/uL (ref 0.0–0.1)
Basophils Relative: 1 % (ref 0–1)
Eosinophils Absolute: 0.6 10*3/uL (ref 0.0–0.7)
Neutro Abs: 12.5 10*3/uL — ABNORMAL HIGH (ref 1.7–7.7)
Neutrophils Relative %: 74 % (ref 43–77)

## 2010-12-18 MED ORDER — CEFTRIAXONE SODIUM 1 G IJ SOLR
1.0000 g | Freq: Once | INTRAMUSCULAR | Status: AC
  Administered 2010-12-18: 1 g via INTRAMUSCULAR
  Filled 2010-12-18: qty 10

## 2010-12-18 MED ORDER — OXYCODONE-ACETAMINOPHEN 5-325 MG PO TABS
2.0000 | ORAL_TABLET | Freq: Once | ORAL | Status: AC
  Administered 2010-12-18: 2 via ORAL
  Filled 2010-12-18: qty 2

## 2010-12-18 MED ORDER — OXYCODONE-ACETAMINOPHEN 5-325 MG PO TABS: 2.0000 | ORAL_TABLET | Freq: Four times a day (QID) | ORAL | Status: DC | PRN

## 2010-12-18 MED ORDER — OXYCODONE-ACETAMINOPHEN 5-325 MG PO TABS: 2.0000 | ORAL_TABLET | ORAL | Status: AC | PRN

## 2010-12-18 MED ORDER — LIDOCAINE HCL (PF) 1 % IJ SOLN
INTRAMUSCULAR | Status: AC
  Filled 2010-12-18: qty 5

## 2010-12-18 NOTE — ED Notes (Signed)
MD at bedside. 

## 2010-12-18 NOTE — ED Provider Notes (Signed)
History     CSN: [id number] Arrival date & time: 12/18/2010  1:01 PM   First MD Initiated Contact with Patient 12/18/10 1408      Chief Complaint  Patient presents with  . Breast Pain    (Consider location/radiation/quality/duration/timing/severity/associated sxs/prior treatment) HPI Reports having left nipple pain x 4 days, went to pcp and started on antibiotics with no relief.  Past Medical History  Diagnosis Date  . Asthma   . Pregnant state, incidental     History reviewed. No pertinent past surgical history.  Family History  Problem Relation Age of Onset  . Healthy Mother   . Healthy Father     History  Substance Use Topics  . Smoking status: Former Games developer  . Smokeless tobacco: Not on file  . Alcohol Use: No    OB History    Grav Para Term Preterm Abortions TAB SAB Ect Mult Living   1               Review of Systems  All other systems reviewed and are negative.    Allergies  Review of patient's allergies indicates no known allergies.  Home Medications   Current Outpatient Rx  Name Route Sig Dispense Refill  . ALBUTEROL SULFATE HFA 108 (90 BASE) MCG/ACT IN AERS Inhalation Inhale 2 puffs into the lungs every 6 (six) hours as needed. For wheezing and shortness of breath.     . BUDESONIDE 180 MCG/ACT IN AEPB Inhalation Inhale 1 puff into the lungs 2 (two) times daily.      Marland Kitchen PRENATAL VITAMINS (DIS) PO TABS Oral Take 1 tablet by mouth daily.     . TRIAMCINOLONE ACETONIDE 0.1 % EX CREA Topical Apply 1 application topically 2 (two) times daily as needed. For eczema.       BP 138/57  Pulse 120  Temp(Src) 98.1 F (36.7 C) (Oral)  Resp 18  SpO2 99%  LMP 04/26/2010  Physical Exam  Nursing note and vitals reviewed. Constitutional: She is oriented to person, place, and time. She appears well-developed and well-nourished. No distress.  HENT:  Head: Normocephalic and atraumatic.  Eyes: Pupils are equal, round, and reactive to light.  Neck: Normal  range of motion.  Cardiovascular: Normal rate and intact distal pulses.   Pulmonary/Chest: No respiratory distress.    Abdominal: Normal appearance. She exhibits no distension.  Musculoskeletal: Normal range of motion.  Neurological: She is alert and oriented to person, place, and time. No cranial nerve deficit.  Skin: Skin is warm and dry. No rash noted.  Psychiatric: She has a normal mood and affect. Her behavior is normal.    ED Course  Procedures (including critical care time)  Labs Reviewed  CBC - Abnormal; Notable for the following:    WBC 16.9 (*)    All other components within normal limits  DIFFERENTIAL - Abnormal; Notable for the following:    Neutro Abs 12.5 (*)    All other components within normal limits  POCT I-STAT, CHEM 8 - Abnormal; Notable for the following:    BUN 5 (*)    All other components within normal limits  I-STAT, CHEM 8   No results found.   1. Breast pain in pregnancy       MDM  Family Practice consulted for possible admission.  Seen by FP.  Will order ceftriaxone and have close f/u in AM       Nelia Shi, MD 12/18/10 1615

## 2010-12-18 NOTE — ED Notes (Signed)
Reports having left nipple pain x 4 days, went to pcp and started on antibiotics with no relief.

## 2010-12-18 NOTE — Discharge Summary (Signed)
Physician Discharge Summary  Patient ID: [name] [name] MRN: [id number] DOB/AGE: May 29, 1985 25 y.o.  Emergency Department Discharge date: 12/18/2010  Diagnoses:  Active Problems:  Breast pain in pregnancy   Discharged Condition: good  Emergency Department Course: 25 year-old female presents to the ED with 2 day history of worsening of left nipple pain. She presented to her OB agrees her OB/GYN on Friday, December 14. She started on Keflex 500 mg by mouth every 6 hours. Instructed to take Tylenol as needed for pain. Is compliant with her Keflex. She's been afebrile. She she states that her left. He nipple pain is worse. Associated with the white and yellow nipple discharge she does express during showers. The pain is slightly improved with Tylenol and warm compresses. She states that the pain is 8/10 in severity. She denies any fevers, chills, sweats, malaise.  Consults: none  Significant Diagnostic Studies: labs: White Blood cell 16.9, ANC 12.5  Treatments: Patient given 1 g of IM ceftriaxone. And Percocet 10/325 mg by mouth times one.   Discharge Exam: Blood pressure 138/57, pulse 120, temperature 98.1 F (36.7 C), temperature source Oral, resp. rate 18, last menstrual period 04/26/2010, SpO2 99.00%. General appearance: alert, cooperative and mild distress Resp: clear to auscultation bilaterally Breasts: normal appearance, no masses or tenderness, No nipple retraction or dimpling, No nipple discharge or bleeding, No axillary or supraclavicular adenopathy, positive findings: 2 cm of circumferential area with induration around left nipple. There is no skin dimpling or cellulitis. Retired left breast is slightly tender to palpation with the worse tenderness around the nipple. I am unable to express nipple discharge. The right breast is normal Cardio: regular rate and rhythm, S1, S2 normal, no murmur, click, rub or gallop GI: Gravid. Lymph nodes: Cervical, supraclavicular, and axillary  nodes normal.   Assessment and plan: A: Nipple mass of unknown etiology. Suspect mammary ducts obstruction. Physical exam not consistent with cellulitis or mastitis. Malignancy unlikely, but possible.   P:After discussing the case with the on-call midwife at [facility] and my attending physician,  the plan is to discharge patient to home with pain medication. Reviewed with Dr. Pattricia Boss return to care including, fever, worsening pain, malaise. The patient agrees with this plan and is understanding. Patient is  to followup at [facility] tomorrow. She is instructed to call for a work in appointment for referral for diagnostic ultrasound, and referral to Gen. Surgery.   Disposition: Home or Self Care  Discharge Orders    Future Appointments: Provider: Department: Dept Phone: Center:   01/09/2011 2:15 PM Carney Living, MD [facility] [phone] [facility]     Discharge Medication List as of 12/18/2010  4:24 PM    CONTINUE these medications which have CHANGED   Details  oxyCODONE-acetaminophen (PERCOCET) 5-325 MG per tablet Take 2 tablets by mouth every 6 (six) hours as needed for pain., Starting 12/18/2010, Until Wed 12/28/10, Print      CONTINUE these medications which have NOT CHANGED   Details  albuterol (PROVENTIL HFA;VENTOLIN HFA) 108 (90 BASE) MCG/ACT inhaler Inhale 2 puffs into the lungs every 6 (six) hours as needed. For wheezing and shortness of breath. , Until Discontinued, Historical Med    budesonide (PULMICORT) 180 MCG/ACT inhaler Inhale 1 puff into the lungs 2 (two) times daily.  , Starting 11/30/2010, Until Thu 11/30/11, Historical Med    Prenatal Vitamins (DIS) TABS Take 1 tablet by mouth daily. , Until Discontinued, Historical Med    triamcinolone cream (KENALOG)  0.1 % Apply 1 application topically 2 (two) times daily as needed. For eczema. , Starting 11/07/2010, Until Tue 11/07/11, Historical Med       Follow-up Information    [facility] Call on 12/19/2010. (Call for same day ED f/u appointment for referral to General Surgery and  breast  diagnositic ultrasound)    Contact information:   [address] [place] [address]       Follow up with Carney Living, MD .         Signed: Dessa Phi 12/18/2010, 10:59 PM

## 2010-12-19 NOTE — Discharge Summary (Signed)
FMTS Attending Note: Denny Levy MD [phone] pager office [phone] I have discussed this patient with the resident and reviewed the assessment and plan as documented above at the the time of her Emergency Room visit. I agree wit the resident's findings and plan.

## 2010-12-20 ENCOUNTER — Ambulatory Visit (INDEPENDENT_AMBULATORY_CARE_PROVIDER_SITE_OTHER): Payer: Medicaid Other | Admitting: Family Medicine

## 2010-12-20 ENCOUNTER — Other Ambulatory Visit: Payer: Self-pay | Admitting: Family Medicine

## 2010-12-20 ENCOUNTER — Encounter: Payer: Self-pay | Admitting: Family Medicine

## 2010-12-20 DIAGNOSIS — N61 Mastitis without abscess: Secondary | ICD-10-CM

## 2010-12-20 DIAGNOSIS — N611 Abscess of the breast and nipple: Secondary | ICD-10-CM

## 2010-12-20 DIAGNOSIS — L0291 Cutaneous abscess, unspecified: Secondary | ICD-10-CM

## 2010-12-20 DIAGNOSIS — L0390 Cellulitis, unspecified: Secondary | ICD-10-CM

## 2010-12-20 NOTE — Patient Instructions (Addendum)
Please go to the [facility] for imaging and treatment. Continue antibiotic and Percocet.  May take OTC Tylenol 500 as needed for pain. Follow up with OB/GYN as scheduled for prenatal care. Call MD or go to MAU if you develop fevers, chills, night sweats, nausea/vomiting, or symptoms become worse.  Abscess An abscess (boil or furuncle) is an infected area that contains a collection of pus.  SYMPTOMS Signs and symptoms of an abscess include pain, tenderness, redness, or hardness. You may feel a moveable soft area under your skin. An abscess can occur anywhere in the body.  TREATMENT  A surgical cut (incision) may be made over your abscess to drain the pus. Gauze may be packed into the space or a drain may be looped through the abscess cavity (pocket). This provides a drain that will allow the cavity to heal from the inside outwards. The abscess may be painful for a few days, but should feel much better if it was drained.  Your abscess, if seen early, may not have localized and may not have been drained. If not, another appointment may be required if it does not get better on its own or with medications. HOME CARE INSTRUCTIONS   Only take over-the-counter or prescription medicines for pain, discomfort, or fever as directed by your caregiver.   Take your antibiotics as directed if they were prescribed. Finish them even if you start to feel better.   Keep the skin and clothes clean around your abscess.   If the abscess was drained, you will need to use gauze dressing to collect any draining pus. Dressings will typically need to be changed 3 or more times a day.   The infection may spread by skin contact with others. Avoid skin contact as much as possible.   Practice good hygiene. This includes regular hand washing, cover any draining skin lesions, and do not share personal care items.   If you participate in sports, do not share athletic equipment, towels, whirlpools, or  personal care items. Shower after every practice or tournament.   If a draining area cannot be adequately covered:   Do not participate in sports.   Children should not participate in day care until the wound has healed or drainage stops.   If your caregiver has given you a follow-up appointment, it is very important to keep that appointment. Not keeping the appointment could result in a much worse infection, chronic or permanent injury, pain, and disability. If there is any problem keeping the appointment, you must call back to this facility for assistance.  SEEK MEDICAL CARE IF:   You develop increased pain, swelling, redness, drainage, or bleeding in the wound site.   You develop signs of generalized infection including muscle aches, chills, fever, or a general ill feeling.   You have an oral temperature above 102 F (38.9 C).  MAKE SURE YOU:   Understand these instructions.   Will watch your condition.   Will get help right away if you are not doing well or get worse.  Document Released: 09/28/2004 Document Revised: 08/31/2010 Document Reviewed: 07/23/2007 [facility] Patient Information 2012 [place], [place].

## 2010-12-20 NOTE — Progress Notes (Signed)
  Subjective:    Patient ID: [name] [name], female    DOB: 04/24/1985, 25 y.o.   MRN: [id number]  HPI  Patient presents to clinic with left breast pain. Pain started 2 weeks ago but has become progressively worse in the last 2 days. Patient was seen by her OB physician 5 days ago and was started on cephalexin. 2 days ago, patient went to the ED because pain was unbearable. She was given 20 tablets of Percocet and was advised to followup with her PCP for general surgery referral. Patient complains of tenderness around the areola, and at times will notice pus draining from lesion in the shower.   Of note, patient is [redacted] weeks pregnant and goes to [facility] for prenatal care. Denies any fevers at home, chills, nausea or vomiting.   Review of Systems Per history of present illness    Objective:   Physical Exam  Constitutional: No distress.       tearful  Cardiovascular: Normal heart sounds.   Pulmonary/Chest: Effort normal and breath sounds normal.  Skin:       Left breast appears more red and swollen compared to right.  Induration along left areola and inferior areola.  Mild erythema located below areola.  Tender on palpation of left breast and areola.  No active bleeding or drainage appreciated.          Assessment & Plan:

## 2010-12-20 NOTE — Assessment & Plan Note (Signed)
Breast pain likely secondary to breast abscess. Will send patient to [facility] Center [facility] for imaging and possible drainage. Continue to take antibiotics for ten-day course. Followup with OB/GYN as scheduled.

## 2010-12-21 ENCOUNTER — Other Ambulatory Visit (INDEPENDENT_AMBULATORY_CARE_PROVIDER_SITE_OTHER): Payer: Self-pay | Admitting: Surgery

## 2010-12-21 ENCOUNTER — Ambulatory Visit (INDEPENDENT_AMBULATORY_CARE_PROVIDER_SITE_OTHER): Payer: Medicaid Other | Admitting: Surgery

## 2010-12-21 ENCOUNTER — Ambulatory Visit
Admission: RE | Admit: 2010-12-21 | Discharge: 2010-12-21 | Disposition: A | Discharge status: Home / Self Care | Payer: Medicaid Other | Source: Ambulatory Visit | Attending: Family Medicine | Admitting: Family Medicine

## 2010-12-21 ENCOUNTER — Encounter (INDEPENDENT_AMBULATORY_CARE_PROVIDER_SITE_OTHER): Payer: Self-pay | Admitting: Surgery

## 2010-12-21 VITALS — BP 122/68 | HR 66 | Temp 97.6°F | Resp 18 | Ht 60.0 in | Wt 176.0 lb

## 2010-12-21 DIAGNOSIS — L0291 Cutaneous abscess, unspecified: Secondary | ICD-10-CM

## 2010-12-21 DIAGNOSIS — N611 Abscess of the breast and nipple: Secondary | ICD-10-CM

## 2010-12-21 DIAGNOSIS — N61 Mastitis without abscess: Secondary | ICD-10-CM

## 2010-12-21 NOTE — Progress Notes (Signed)
The patient was referred down from the breast center. She's [redacted] weeks pregnant and for the last 10 days has had a painful left breast. She was started on Keflex by her OB and continues to take. She was also given oxycodone to take for pain. She is almost out of those we will refill less. The indurated area in the left breast was in the one to 3:00 positions. I went ahead and went into the center of this with a needle and then had numbness with 1% lidocaine with Neut and after using the needle guide and he went and cut down on this with 11 blade getting into the breast and this abscess whereupon a flow of purulent material came forth. This was not foul-smelling and was more yellow creamy pus. Cultures were obtained.  Plan I promote breast drainage and return to see [place] in 7-10 days. Check on culture as if this is MRSA he may need to switch to doxycycline.

## 2010-12-21 NOTE — Patient Instructions (Signed)
Take warm showers and with compression try to expel this purulence from your left breast. Call the office Friday to check culture Followup in 7-10 days

## 2010-12-21 NOTE — Assessment & Plan Note (Signed)
I & D with culture 12/21/10

## 2010-12-24 LAB — WOUND CULTURE: Organism ID, Bacteria: NO GROWTH

## 2011-01-03 NOTE — L&D Delivery Note (Signed)
Delivery Note Pt pushed great about 3 times and at 8:08 PM a viable female was delivered via Vaginal, Spontaneous Delivery (Presentation: LOA).  APGAR: 8,8 weight 5#11oz   Placenta status:delivered spontaneously .  Cord:  with the following complications: none  Anesthesia: epidural  Episiotomy: n/a Lacerations:none  Suture Repair: n/a Est. Blood Loss (mL): 350cc  Mom to postpartum.  Baby to nursery-stable.  Oliver Pila 01/23/2011, 8:22 PM

## 2011-01-09 ENCOUNTER — Ambulatory Visit: Payer: Medicaid Other | Admitting: Family Medicine

## 2011-01-11 ENCOUNTER — Ambulatory Visit: Payer: Self-pay | Admitting: Family Medicine

## 2011-01-22 ENCOUNTER — Encounter (HOSPITAL_COMMUNITY): Payer: Self-pay | Admitting: *Deleted

## 2011-01-22 ENCOUNTER — Inpatient Hospital Stay (HOSPITAL_COMMUNITY)
Admission: AD | Admit: 2011-01-22 | Discharge: 2011-01-22 | Disposition: A | Discharge status: Home / Self Care | Payer: Medicaid Other | Source: Ambulatory Visit | Attending: Obstetrics and Gynecology | Admitting: Obstetrics and Gynecology

## 2011-01-22 DIAGNOSIS — Z36 Encounter for antenatal screening of mother: Secondary | ICD-10-CM

## 2011-01-22 DIAGNOSIS — O368190 Decreased fetal movements, unspecified trimester, not applicable or unspecified: Secondary | ICD-10-CM | POA: Insufficient documentation

## 2011-01-22 DIAGNOSIS — Z3689 Encounter for other specified antenatal screening: Secondary | ICD-10-CM

## 2011-01-22 NOTE — ED Provider Notes (Signed)
History     Chief Complaint  Patient presents with  . Decreased Fetal Movement   HPI  Pt states, " I only felt my baby move two times on Saturday. I called Dr Ambrose Mantle and he sent me in."  Pt denies vaginal bleeding or contractions.     Past Medical History  Diagnosis Date  . Asthma   . Pregnant state, incidental   . Breast abscess     Past Surgical History  Procedure Date  . No past surgeries     Family History  Problem Relation Age of Onset  . Healthy Mother   . Healthy Father     History  Substance Use Topics  . Smoking status: Former Smoker    Quit date: 06/22/2010  . Smokeless tobacco: Never Used  . Alcohol Use: No    Allergies: No Known Allergies  Prescriptions prior to admission  Medication Sig Dispense Refill  . acetaminophen (TYLENOL) 325 MG tablet Take 650 mg by mouth every 6 (six) hours as needed. For headache or pain      . albuterol (PROVENTIL HFA;VENTOLIN HFA) 108 (90 BASE) MCG/ACT inhaler Inhale 2 puffs into the lungs every 6 (six) hours as needed. For wheezing and shortness of breath.       . budesonide (PULMICORT) 180 MCG/ACT inhaler Inhale 1 puff into the lungs 2 (two) times daily.        . Calcium Carbonate Antacid (TUMS PO) Take 1 tablet by mouth daily.      . Prenatal Vit-Fe Fumarate-FA (PRENATAL MULTIVITAMIN) TABS Take 1 tablet by mouth daily.        Review of Systems  Constitutional:       Decreased fetal movement  All other systems reviewed and are negative.   Physical Exam   Blood pressure 132/73, pulse 127, temperature 98.9 F (37.2 C), temperature source Oral, resp. rate 20, height 5' (1.524 m), weight 82.101 kg (181 lb), last menstrual period 04/26/2010.  Physical Exam  Constitutional: She is oriented to person, place, and time. She appears well-developed and well-nourished. No distress.  HENT:  Head: Normocephalic.  Neck: Normal range of motion. Neck supple.  Cardiovascular: Normal rate, regular rhythm and normal heart  sounds.   Respiratory: Effort normal and breath sounds normal.  GI: Soft. There is no tenderness.  Genitourinary: No bleeding around the vagina. Vaginal discharge (mucusy) found.  Neurological: She is alert and oriented to person, place, and time.  Skin: Skin is warm and dry.  FHR 130's, +accel, reactive Toco - irregular (not felt by pt) Cervix - 1-2/50/-1  MAU Course  Procedures  Consult with Dr. Ambrose Mantle regarding pt exam and NST > may discharge home Assessment and Plan  Reactive Non-Stress Test  Plan: Discharge to home Reviewed fetal movement Keep scheduled appointment for this week  [facility] 01/22/2011, 1:49 AM

## 2011-01-22 NOTE — Progress Notes (Signed)
Pt states, " I only felt my baby move two times on Saturday. I called Dr Ambrose Mantle and he sent me in."

## 2011-01-23 ENCOUNTER — Encounter (INDEPENDENT_AMBULATORY_CARE_PROVIDER_SITE_OTHER): Payer: Medicaid Other | Admitting: Surgery

## 2011-01-23 ENCOUNTER — Inpatient Hospital Stay (HOSPITAL_COMMUNITY)
Admission: AD | Admit: 2011-01-23 | Discharge: 2011-01-25 | DRG: 774 | Disposition: A | Discharge status: Home / Self Care | Payer: Medicaid Other | Source: Ambulatory Visit | Attending: Obstetrics and Gynecology | Admitting: Obstetrics and Gynecology

## 2011-01-23 ENCOUNTER — Encounter (HOSPITAL_COMMUNITY): Payer: Self-pay | Admitting: Anesthesiology

## 2011-01-23 ENCOUNTER — Encounter (HOSPITAL_COMMUNITY): Payer: Self-pay | Admitting: *Deleted

## 2011-01-23 ENCOUNTER — Inpatient Hospital Stay (HOSPITAL_COMMUNITY): Payer: Medicaid Other | Admitting: Anesthesiology

## 2011-01-23 DIAGNOSIS — Z22330 Carrier of Group B streptococcus: Secondary | ICD-10-CM

## 2011-01-23 DIAGNOSIS — O91019 Infection of nipple associated with pregnancy, unspecified trimester: Secondary | ICD-10-CM | POA: Diagnosis present

## 2011-01-23 DIAGNOSIS — O99892 Other specified diseases and conditions complicating childbirth: Principal | ICD-10-CM | POA: Diagnosis present

## 2011-01-23 LAB — CBC
HCT: 38.4 % (ref 36.0–46.0)
Hemoglobin: 13.1 g/dL (ref 12.0–15.0)
MCHC: 34.1 g/dL (ref 30.0–36.0)
RDW: 14 % (ref 11.5–15.5)
WBC: 28.1 10*3/uL — ABNORMAL HIGH (ref 4.0–10.5)

## 2011-01-23 MED ORDER — PENICILLIN G POTASSIUM 5000000 UNITS IJ SOLR
2.5000 10*6.[IU] | INTRAMUSCULAR | Status: DC
  Administered 2011-01-23 (×2): 2.5 10*6.[IU] via INTRAVENOUS
  Filled 2011-01-23 (×5): qty 2.5

## 2011-01-23 MED ORDER — EPHEDRINE 5 MG/ML INJ
10.0000 mg | INTRAVENOUS | Status: DC | PRN
  Filled 2011-01-23: qty 4

## 2011-01-23 MED ORDER — CITRIC ACID-SODIUM CITRATE 334-500 MG/5ML PO SOLN: 30.0000 mL | ORAL | Status: DC | PRN

## 2011-01-23 MED ORDER — IBUPROFEN 600 MG PO TABS: 600.0000 mg | ORAL_TABLET | Freq: Four times a day (QID) | ORAL | Status: DC | PRN

## 2011-01-23 MED ORDER — OXYTOCIN BOLUS FROM INFUSION
500.0000 mL | Freq: Once | INTRAVENOUS | Status: DC
  Filled 2011-01-23: qty 500

## 2011-01-23 MED ORDER — LACTATED RINGERS IV SOLN: 500.0000 mL | INTRAVENOUS | Status: DC | PRN

## 2011-01-23 MED ORDER — OXYTOCIN 20 UNITS IN LACTATED RINGERS INFUSION - SIMPLE: 125.0000 mL/h | Freq: Once | INTRAVENOUS | Status: DC

## 2011-01-23 MED ORDER — PRENATAL MULTIVITAMIN CH
1.0000 | ORAL_TABLET | Freq: Every day | ORAL | Status: DC
  Administered 2011-01-24 – 2011-01-25 (×2): 1 via ORAL
  Filled 2011-01-23 (×2): qty 1

## 2011-01-23 MED ORDER — SODIUM BICARBONATE 8.4 % IV SOLN
INTRAVENOUS | Status: DC | PRN
  Administered 2011-01-23: 4 mL via EPIDURAL

## 2011-01-23 MED ORDER — PHENYLEPHRINE 40 MCG/ML (10ML) SYRINGE FOR IV PUSH (FOR BLOOD PRESSURE SUPPORT)
80.0000 ug | PREFILLED_SYRINGE | INTRAVENOUS | Status: DC | PRN
  Filled 2011-01-23: qty 5

## 2011-01-23 MED ORDER — EPHEDRINE 5 MG/ML INJ: 10.0000 mg | INTRAVENOUS | Status: DC | PRN

## 2011-01-23 MED ORDER — FLUTICASONE PROPIONATE HFA 44 MCG/ACT IN AERO
2.0000 | INHALATION_SPRAY | Freq: Two times a day (BID) | RESPIRATORY_TRACT | Status: DC
  Administered 2011-01-23: 2 via RESPIRATORY_TRACT
  Filled 2011-01-23: qty 10.6

## 2011-01-23 MED ORDER — LACTATED RINGERS IV SOLN
500.0000 mL | Freq: Once | INTRAVENOUS | Status: AC
  Administered 2011-01-23: 600 mL via INTRAVENOUS

## 2011-01-23 MED ORDER — FENTANYL 2.5 MCG/ML BUPIVACAINE 1/10 % EPIDURAL INFUSION (WH - ANES)
INTRAMUSCULAR | Status: DC | PRN
  Administered 2011-01-23: 12 mL/h via EPIDURAL

## 2011-01-23 MED ORDER — TERBUTALINE SULFATE 1 MG/ML IJ SOLN: 0.2500 mg | Freq: Once | INTRAMUSCULAR | Status: DC | PRN

## 2011-01-23 MED ORDER — ONDANSETRON HCL 4 MG/2ML IJ SOLN: 4.0000 mg | Freq: Four times a day (QID) | INTRAMUSCULAR | Status: DC | PRN

## 2011-01-23 MED ORDER — OXYCODONE-ACETAMINOPHEN 5-325 MG PO TABS
1.0000 | ORAL_TABLET | ORAL | Status: DC | PRN
  Administered 2011-01-25 (×2): 1 via ORAL
  Filled 2011-01-23 (×2): qty 1

## 2011-01-23 MED ORDER — ACETAMINOPHEN 325 MG PO TABS: 650.0000 mg | ORAL_TABLET | ORAL | Status: DC | PRN

## 2011-01-23 MED ORDER — PHENYLEPHRINE 40 MCG/ML (10ML) SYRINGE FOR IV PUSH (FOR BLOOD PRESSURE SUPPORT): 80.0000 ug | PREFILLED_SYRINGE | INTRAVENOUS | Status: DC | PRN

## 2011-01-23 MED ORDER — OXYTOCIN 20 UNITS IN LACTATED RINGERS INFUSION - SIMPLE
1.0000 m[IU]/min | INTRAVENOUS | Status: DC
  Administered 2011-01-23: 2 m[IU]/min via INTRAVENOUS
  Filled 2011-01-23: qty 1000

## 2011-01-23 MED ORDER — LANOLIN HYDROUS EX OINT: TOPICAL_OINTMENT | CUTANEOUS | Status: DC | PRN

## 2011-01-23 MED ORDER — PENICILLIN G POTASSIUM 5000000 UNITS IJ SOLR
5.0000 10*6.[IU] | Freq: Once | INTRAVENOUS | Status: AC
  Administered 2011-01-23: 5 10*6.[IU] via INTRAVENOUS
  Filled 2011-01-23: qty 5

## 2011-01-23 MED ORDER — FENTANYL 2.5 MCG/ML BUPIVACAINE 1/10 % EPIDURAL INFUSION (WH - ANES)
14.0000 mL/h | INTRAMUSCULAR | Status: DC
  Administered 2011-01-23 (×3): 14 mL/h via EPIDURAL
  Filled 2011-01-23 (×4): qty 60

## 2011-01-23 MED ORDER — WITCH HAZEL-GLYCERIN EX PADS: 1.0000 "application " | MEDICATED_PAD | CUTANEOUS | Status: DC | PRN

## 2011-01-23 MED ORDER — LIDOCAINE HCL (PF) 1 % IJ SOLN
30.0000 mL | INTRAMUSCULAR | Status: DC | PRN
  Filled 2011-01-23: qty 30

## 2011-01-23 MED ORDER — TETANUS-DIPHTH-ACELL PERTUSSIS 5-2.5-18.5 LF-MCG/0.5 IM SUSP: 0.5000 mL | Freq: Once | INTRAMUSCULAR | Status: DC

## 2011-01-23 MED ORDER — SENNOSIDES-DOCUSATE SODIUM 8.6-50 MG PO TABS
2.0000 | ORAL_TABLET | Freq: Every day | ORAL | Status: DC
  Administered 2011-01-24: 2 via ORAL

## 2011-01-23 MED ORDER — ONDANSETRON HCL 4 MG/2ML IJ SOLN: 4.0000 mg | INTRAMUSCULAR | Status: DC | PRN

## 2011-01-23 MED ORDER — BENZOCAINE-MENTHOL 20-0.5 % EX AERO
1.0000 "application " | INHALATION_SPRAY | CUTANEOUS | Status: DC | PRN
  Administered 2011-01-24: 1 via TOPICAL

## 2011-01-23 MED ORDER — IBUPROFEN 600 MG PO TABS
600.0000 mg | ORAL_TABLET | Freq: Four times a day (QID) | ORAL | Status: DC
  Administered 2011-01-23 – 2011-01-25 (×7): 600 mg via ORAL
  Filled 2011-01-23 (×8): qty 1

## 2011-01-23 MED ORDER — FLEET ENEMA 7-19 GM/118ML RE ENEM: 1.0000 | ENEMA | RECTAL | Status: DC | PRN

## 2011-01-23 MED ORDER — ZOLPIDEM TARTRATE 5 MG PO TABS: 5.0000 mg | ORAL_TABLET | Freq: Every evening | ORAL | Status: DC | PRN

## 2011-01-23 MED ORDER — ONDANSETRON HCL 4 MG PO TABS: 4.0000 mg | ORAL_TABLET | ORAL | Status: DC | PRN

## 2011-01-23 MED ORDER — DIPHENHYDRAMINE HCL 25 MG PO CAPS: 25.0000 mg | ORAL_CAPSULE | Freq: Four times a day (QID) | ORAL | Status: DC | PRN

## 2011-01-23 MED ORDER — ALBUTEROL SULFATE HFA 108 (90 BASE) MCG/ACT IN AERS: 2.0000 | INHALATION_SPRAY | Freq: Four times a day (QID) | RESPIRATORY_TRACT | Status: DC | PRN

## 2011-01-23 MED ORDER — OXYCODONE-ACETAMINOPHEN 5-325 MG PO TABS
2.0000 | ORAL_TABLET | ORAL | Status: DC | PRN
Start: 2011-01-23 — End: 2011-01-23

## 2011-01-23 MED ORDER — SIMETHICONE 80 MG PO CHEW: 80.0000 mg | CHEWABLE_TABLET | ORAL | Status: DC | PRN

## 2011-01-23 MED ORDER — LACTATED RINGERS IV SOLN
INTRAVENOUS | Status: DC
  Administered 2011-01-23: 125 mL/h via INTRAVENOUS
  Administered 2011-01-23: 14:00:00 via INTRAVENOUS

## 2011-01-23 MED ORDER — DIPHENHYDRAMINE HCL 50 MG/ML IJ SOLN: 12.5000 mg | INTRAMUSCULAR | Status: DC | PRN

## 2011-01-23 MED ORDER — DIBUCAINE 1 % RE OINT: 1.0000 "application " | TOPICAL_OINTMENT | RECTAL | Status: DC | PRN

## 2011-01-23 NOTE — ED Notes (Signed)
Dr. Ambrose Mantle notified patient c/o of contractions all night that have worsened in the last several hours. Contractions about every 4 minutes on the monitor, fetal tracing reassuring however not reactive. SVE 2/90/-3. Orders to continue to monitor.

## 2011-01-23 NOTE — Anesthesia Preprocedure Evaluation (Addendum)
Anesthesia Evaluation  Patient identified by MRN, date of birth, ID band Patient awake    Reviewed: Allergy & Precautions, H&P , Patient's Chart, lab work & pertinent test results  Airway Mallampati: II TM Distance: >3 FB Neck ROM: full    Dental  (+) Teeth Intact   Pulmonary asthma (no steroids for several weeksd, uses inhaler, chest clear) ,  clear to auscultation        Cardiovascular regular Normal    Neuro/Psych    GI/Hepatic   Endo/Other  Morbid obesity  Renal/GU      Musculoskeletal   Abdominal   Peds  Hematology   Anesthesia Other Findings       Reproductive/Obstetrics (+) Pregnancy                          Anesthesia Physical Anesthesia Plan  ASA: III  Anesthesia Plan: Epidural   Post-op Pain Management:    Induction:   Airway Management Planned:   Additional Equipment:   Intra-op Plan:   Post-operative Plan:   Informed Consent: I have reviewed the patients History and Physical, chart, labs and discussed the procedure including the risks, benefits and alternatives for the proposed anesthesia with the patient or authorized representative who has indicated his/her understanding and acceptance.   Dental Advisory Given  Plan Discussed with:   Anesthesia Plan Comments: (Labs checked- platelets confirmed with RN in room. Fetal heart tracing, per RN, reported to be stable enough for sitting procedure. Discussed epidural, and patient consents to the procedure:  included risk of possible headache,backache, failed block, allergic reaction, and nerve injury. This patient was asked if she had any questions or concerns before the procedure started. )        Anesthesia Quick Evaluation

## 2011-01-23 NOTE — Progress Notes (Signed)
MD notified FHR reactive since SVE, pt very uncomfortable, requesting pain medication... MD states she will put in admit orders.

## 2011-01-23 NOTE — Anesthesia Procedure Notes (Signed)

## 2011-01-23 NOTE — Progress Notes (Signed)
Contractions all day, worsening about 6 hours ago, spotting. Reports good fetal movement. Denies problems with preg.

## 2011-01-23 NOTE — Progress Notes (Signed)
   Subjective: Pt comfortable with epidural  Objective: BP 118/77  Pulse 115  Temp(Src) 98.2 F (36.8 C) (Oral)  Resp 20  Ht 5' (1.524 m)  Wt 83.008 kg (183 lb)  BMI 35.74 kg/m2  SpO2 97%  LMP 04/26/2010      FHT:  FHR: 135 bpm, variability: moderate,  accelerations:  Present,  decelerations:  Absent UC:   regular, every 3-5 minutes SVE: 90/4/-1  AROM clear  IUPC placed to adjust pitocin Labs: Lab Results  Component Value Date   WBC 28.1* 01/23/2011   HGB 13.1 01/23/2011   HCT 38.4 01/23/2011   MCV 97.0 01/23/2011   PLT 420* 01/23/2011    Assessment / Plan: Pt will be augmented with ptiocin as needed, making slow steady progress c/w early labor Tyrae Alcoser W 01/23/2011, 1:41 PM

## 2011-01-23 NOTE — H&P (Signed)
[name] [name] is a 26 y.o. female G1P0 at 36+ weeks (EDD 02/17/11 by 6 week [place]) presented to MAU with painful contractions.  Has been observed for 3 hours with cervical change to 90/3/-1.  In office last week was 50/1/-2.  Prenatal care uneventful except GBS positive.  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  She was having some tension HA's and saw neuro--has been ok off meds.  She has had a breast abscess treated with keflex and heat.    Maternal Medical History:  Reason for admission: Reason for admission: contractions.  Contractions: Onset was 3-5 hours ago.   Frequency: regular.   Perceived severity is strong.    Fetal activity: Perceived fetal activity is normal.      OB History    Grav Para Term Preterm Abortions TAB SAB Ect Mult Living   1              Past Medical History  Diagnosis Date  . Asthma   . Pregnant state, incidental   . Breast abscess    Past Surgical History  Procedure Date  . No past surgeries    Family History: family history includes Healthy in her father and mother. Social History:  reports that she quit smoking about 7 months ago. She has never used smokeless tobacco. She reports that she does not drink alcohol or use illicit drugs.  ROS  Dilation: 2.5 Effacement (%): 90 Station: -1 Exam by:: Dorrene German RN Blood pressure 126/59, pulse 114, temperature 98.4 F (36.9 C), resp. rate 20, height 5' (1.524 m), weight 83.008 kg (183 lb), last menstrual period 04/26/2010. Maternal Exam:  Uterine Assessment: Contraction strength is firm.  Contraction frequency is regular.   Abdomen: Patient reports no abdominal tenderness. Fetal presentation: vertex  Introitus: Normal vulva. Normal vagina.    Physical Exam  Constitutional: She is oriented to person, place, and time. She appears well-developed and well-nourished.  Cardiovascular: Normal rate and regular rhythm.   Respiratory: Effort normal and breath sounds normal.  GI: Soft. Bowel sounds are normal.    Genitourinary: Vagina normal and uterus normal.       90/3/-1 per RN  Neurological: She is alert and oriented to person, place, and time.  Psychiatric: She has a normal mood and affect. Her behavior is normal.    Prenatal labs: ABO, Rh:  A positive Antibody:  negative Rubella:  Immune RPR:   NR HBsAg:   Neg HIV:   NR GBS:   Positive One hour GTT 105 Integrated screen WNL per pt Assessment/Plan: Pt miserable in early labor.  Will admit and begin PCN for +GBS and allow epidural. AROM after PCN on board.   Oliver Pila 01/23/2011, 8:34 AM

## 2011-01-24 LAB — CBC
HCT: 34.1 % — ABNORMAL LOW (ref 36.0–46.0)
MCHC: 33.4 g/dL (ref 30.0–36.0)
Platelets: 329 10*3/uL (ref 150–400)
RDW: 14.3 % (ref 11.5–15.5)
WBC: 30.9 10*3/uL — ABNORMAL HIGH (ref 4.0–10.5)

## 2011-01-24 LAB — RPR: RPR Ser Ql: NONREACTIVE

## 2011-01-24 MED ORDER — BENZOCAINE-MENTHOL 20-0.5 % EX AERO
INHALATION_SPRAY | CUTANEOUS | Status: AC
  Administered 2011-01-24: 1 via TOPICAL
  Filled 2011-01-24: qty 56

## 2011-01-24 NOTE — Consult Note (Signed)
LACTATION CONSULT FOR LEFT BREAST ABSCESS under left portion of aerolo . Please see Lactation note on Baby's chart , ( Epic would not allow me to copy on to mom's chart ) .

## 2011-01-24 NOTE — Progress Notes (Signed)
UR chart review completed.  

## 2011-01-24 NOTE — Progress Notes (Signed)
Post Partum Day 1 Subjective: no complaints and tolerating PO  Objective: Blood pressure 95/66, pulse 102, temperature 97.5 F (36.4 C), temperature source Oral, resp. rate 18, height 5' (1.524 m), weight 83.008 kg (183 lb), last menstrual period 04/26/2010, SpO2 97.00%, unknown if currently breastfeeding.  Physical Exam:  General: alert Lochia: appropriate Uterine Fundus: firm I  Assessment/Plan: Plan for discharge tomorrow   LOS: 1 day   [name] [name] W 01/24/2011, 8:32 AM

## 2011-01-25 MED ORDER — OXYCODONE-ACETAMINOPHEN 5-325 MG PO TABS: 1.0000 | ORAL_TABLET | ORAL | Status: AC | PRN

## 2011-01-25 MED ORDER — IBUPROFEN 600 MG PO TABS: 600.0000 mg | ORAL_TABLET | Freq: Four times a day (QID) | ORAL | Status: AC

## 2011-01-25 NOTE — Progress Notes (Signed)
Post Partum Day 2 Subjective: Pt had breast abscess drained antepartum and has started to recur.  No fevers, just tender about 3-4 cm and fluctuant to left of right nipple.  Saw [facility] for it and was scheduled to go in the day she delivered to recheck.  Bottlefeeding now per her wishes.  Other pain controlled  Objective: Blood pressure 103/68, pulse 89, temperature 97.8 F (36.6 C), temperature source Oral, resp. rate 18, height 5' (1.524 m), weight 83.008 kg (183 lb), last menstrual period 04/26/2010, SpO2 97.00%, unknown if currently breastfeeding.  Physical Exam:  General: alert Lochia: appropriate Uterine Fundus: firm Left breast with 3-4 cm indurated and fluctuant abscess to left of nipple, slight erythema   Basename 01/24/11 0530 01/23/11 0858  HGB 11.4* 13.1  HCT 34.1* 38.4    Assessment/Plan: Discharge home Motrin, percocet Pt has h/o recurrent skin abscesses and this one on left nipple recurring.  Advised to call [facility] today and be seen in next 2 days to see if needs to be re-drained.  No fevers or systemic symptoms and seems fairly localized.     LOS: 2 days   Terel Bann W 01/25/2011, 8:44 AM

## 2011-01-25 NOTE — Discharge Summary (Signed)
Obstetric Discharge Summary Reason for Admission: onset of labor Prenatal Procedures: none Intrapartum Procedures: spontaneous vaginal delivery Postpartum Procedures: none Complications-Operative and Postpartum: pt has recurrent breast abscess (localized) Hemoglobin  Date Value Range Status  01/24/2011 11.4* 12.0-15.0 (g/dL) Final     HCT  Date Value Range Status  01/24/2011 34.1* 36.0-46.0 (%) Final    Discharge Diagnoses: Preterm pregnancy delivered at 36+ weeks  Discharge Information: Date: 01/25/2011 Activity: pelvic rest Diet: routine Medications: Ibuprofen and Percocet Condition: stable Instructions: refer to practice specific booklet Discharge to: home   Newborn Data: Live born female  Birth Weight: 5 lb 11 oz (2580 g) APGAR: 8, 8  Home with mother.  Oliver Pila 01/25/2011, 8:49 AM

## 2011-01-25 NOTE — Addendum Note (Signed)
Addendum  created 01/25/11 0718 by Jiles Garter, MD   Modules edited:Notes Section

## 2011-01-25 NOTE — Consult Note (Signed)
Lactation note : Per report from RN and central [facility] pt has switched to bottle , confirmed with mom at visit . Per mom plans to call [facility] for F/U of recurrent breast abscess under the left aerolo . Discussed with mom the importance of F/u in the next 2 days ( per Dr. Berenda Morale recommendation ) ,also due to potential challenges when her milk comes in . Also discussed using cold cabbage leaves on the right breast to dry up milk , and to wait for instructions from the surgeon after the aspiration on the left breast dur to the abscess.

## 2011-01-25 NOTE — Anesthesia Postprocedure Evaluation (Signed)
  Anesthesia Post-op Note  Patient: [name] [name] This patient has recovered from her labor epidural, and I am not aware of any complications or problems.

## 2011-01-31 ENCOUNTER — Other Ambulatory Visit: Payer: Self-pay | Admitting: Family Medicine

## 2011-01-31 NOTE — Telephone Encounter (Signed)
Refill request

## 2011-02-01 ENCOUNTER — Encounter (INDEPENDENT_AMBULATORY_CARE_PROVIDER_SITE_OTHER): Payer: Self-pay | Admitting: Surgery

## 2011-02-14 ENCOUNTER — Encounter (INDEPENDENT_AMBULATORY_CARE_PROVIDER_SITE_OTHER): Payer: Self-pay | Admitting: General Surgery

## 2011-02-14 ENCOUNTER — Ambulatory Visit (INDEPENDENT_AMBULATORY_CARE_PROVIDER_SITE_OTHER): Payer: Medicaid Other | Admitting: General Surgery

## 2011-02-14 VITALS — BP 120/72 | HR 94 | Temp 98.0°F | Resp 16 | Ht 60.0 in | Wt 160.0 lb

## 2011-02-14 DIAGNOSIS — N61 Mastitis without abscess: Secondary | ICD-10-CM

## 2011-02-14 DIAGNOSIS — N611 Abscess of the breast and nipple: Secondary | ICD-10-CM

## 2011-02-14 MED ORDER — DOXYCYCLINE HYCLATE 100 MG PO TABS: 100.0000 mg | ORAL_TABLET | Freq: Two times a day (BID) | ORAL | Status: DC

## 2011-02-14 NOTE — Progress Notes (Signed)
Subjective:     Patient ID: [name] [name], female   DOB: 1985-03-09, 26 y.o.   MRN: [id number]  HPI 2 yof who was seen by Dr. Daphine Deutscher in December for breast abscess when she was [redacted] weeks pregnant.  She underwent I and D after that.  She still had a knot there after this healed and called our office.  It has increased in size for the last month and has become increasingly tender.  She denies fevers or drainage.  She has not breast fed due to this.  She comes in today to have this evaluated.  Review of Systems LEFT BREAST ULTRASOUND  Comparison: None  On physical exam, the areola and nipple are erythematous, firm, and  tender. I palpate a firm thickening from the 9 o'clock through the  3 o'clock location of the left breast, inclusive of the nipple.  The left nipple and areola are erythematous compared to the right.  No ulcerations or visible lesions.  Findings: Ultrasound is performed, showing a superficial fluid  collection in the 1 o'clock location of the left breast, 2 cm from  the nipple. This measures 3.0 x 1.3 x 3.1 cm. The periphery of  the collection shows increased blood flow. There is mobile  internal debris within the collection.  IMPRESSION:  Superficial breast abscess in the 1 o'clock location of the left  breast, 2 cm from the nipple. Clinically, this is not improving on  antibiotics by history. Surgical consultation has been arranged  with [facility] today.  BI-RADS CATEGORY 2: Benign finding(s).     Objective:   Physical Exam  Vitals reviewed. Constitutional: She appears well-developed and well-nourished.  Pulmonary/Chest: Right breast exhibits mass and tenderness. Right breast exhibits no inverted nipple and no nipple discharge.         Assessment:     Chronic right breast abscess   Plan:     This appears to be a chronic abscess.  I discussed options including antibiotics, attempt at aspiration versus operative drainage.  I think this is so  chronic at this time that it will likely have to go to the OR and be evacuated.  She would like to attempt a course of antibiotics which I think is ok as there is not really an active infection there.  I will put her on doxy and have her return in 2 weeks.  We discussed wearing sports bra, anti-inflammatories, hot compresses.

## 2011-02-17 ENCOUNTER — Encounter (INDEPENDENT_AMBULATORY_CARE_PROVIDER_SITE_OTHER): Payer: Self-pay | Admitting: General Surgery

## 2011-02-17 ENCOUNTER — Ambulatory Visit (INDEPENDENT_AMBULATORY_CARE_PROVIDER_SITE_OTHER): Payer: Medicaid Other | Admitting: General Surgery

## 2011-02-17 VITALS — BP 120/78 | HR 98 | Temp 97.8°F | Resp 12 | Ht 60.0 in | Wt 161.0 lb

## 2011-02-17 DIAGNOSIS — N611 Abscess of the breast and nipple: Secondary | ICD-10-CM

## 2011-02-17 DIAGNOSIS — N61 Mastitis without abscess: Secondary | ICD-10-CM

## 2011-02-17 MED ORDER — TRAMADOL HCL 50 MG PO TABS: 50.0000 mg | ORAL_TABLET | Freq: Four times a day (QID) | ORAL | Status: DC | PRN

## 2011-02-17 NOTE — Progress Notes (Signed)
Patient ID: [name] [name], female   DOB: 06/27/1985, 26 y.o.   MRN: [id number]  Chief Complaint  Patient presents with  . Abscess    eval left breast abscess    HPI [name] [name] is a 26 y.o. female.   HPI 26-year-old white female comes in because of worsening left breast pain. She has a history of a left breast abscess. She underwent incision and drainage in our office in December 2012. She just saw Dr. Wakefield a few days ago. They discussed several options. They agreed on a trial of antibiotics. She states that she has been taking the antibiotics. However, she states that she is having more tenderness and soreness in her left breast just above her nipple at the 1:00 position. She denies any fevers or chills. She denies any drainage from her nipple. She states that she is not smoking. She is currently not breast-feeding.   Past Medical History  Diagnosis Date  . Asthma   . Pregnant state, incidental   . Breast abscess     Past Surgical History  Procedure Date  . No past surgeries   . Incise and drain abcess     left breast    Family History  Problem Relation Age of Onset  . Healthy Mother   . Healthy Father   . Stroke Maternal Grandmother   . Stroke Paternal Grandfather     Social History History  Substance Use Topics  . Smoking status: Former Smoker    Quit date: 06/22/2010  . Smokeless tobacco: Never Used  . Alcohol Use: No    No Known Allergies  Current Outpatient Prescriptions  Medication Sig Dispense Refill  . albuterol (PROVENTIL HFA;VENTOLIN HFA) 108 (90 BASE) MCG/ACT inhaler Inhale 2 puffs into the lungs every 6 (six) hours as needed. For wheezing and shortness of breath.       . budesonide (PULMICORT) 180 MCG/ACT inhaler Inhale 1 puff into the lungs 2 (two) times daily.        . Calcium Carbonate Antacid (TUMS PO) Take 1 tablet by mouth daily.      . doxycycline (ADOXA) 100 MG tablet Take 100 mg by mouth 2 (two) times daily.      . Prenatal Vit-Fe  Fumarate-FA (PRENATAL MULTIVITAMIN) TABS Take 1 tablet by mouth daily.      . triamcinolone cream (KENALOG) 0.1 % APPLY TOPICALLY TWICE DAILY  30 g  0  . traMADol (ULTRAM) 50 MG tablet Take 1 tablet (50 mg total) by mouth every 6 (six) hours as needed for pain.  30 tablet  0    Review of Systems Review of Systems  Constitutional: Negative for fever, activity change, appetite change and unexpected weight change.  HENT: Negative for neck pain and neck stiffness.   Eyes: Negative for photophobia and visual disturbance.  Respiratory: Negative for apnea, chest tightness and shortness of breath.   Cardiovascular: Negative for chest pain.  Gastrointestinal: Negative for abdominal pain.  Genitourinary: Negative for difficulty urinating.  Musculoskeletal: Negative.   Neurological: Negative for light-headedness and headaches.  Hematological: Negative.   Psychiatric/Behavioral: Negative.     Blood pressure 120/78, pulse 98, temperature 97.8 F (36.6 C), temperature source Temporal, resp. rate 12, height 5' (1.524 m), weight 161 lb (73.029 kg).  Physical Exam Physical Exam  Vitals reviewed. Constitutional: She is oriented to person, place, and time. She appears well-developed and well-nourished. No distress.       obese  HENT:  Head: Normocephalic and atraumatic.  Right Ear:   External ear normal.  Left Ear: External ear normal.  Nose: Nose normal.  Eyes: Conjunctivae are normal. No scleral icterus.  Neck: Normal range of motion. Neck supple. No tracheal deviation present. No thyromegaly present.  Cardiovascular: Normal rate, regular rhythm and normal heart sounds.   Pulmonary/Chest: Effort normal and breath sounds normal. No respiratory distress. She has no wheezes. Right breast exhibits no inverted nipple, no nipple discharge, no skin change and no tenderness. Left breast exhibits tenderness. Left breast exhibits no inverted nipple, no nipple discharge and no skin change.         No  cellulitis. Some TTP in area. Chronic induration. Area is about 3 x3 cm about 2cm from nipple  Abdominal: Soft. Bowel sounds are normal. She exhibits no distension. There is no tenderness.  Musculoskeletal: Normal range of motion.  Neurological: She is alert and oriented to person, place, and time.  Skin: Skin is warm and dry. She is not diaphoretic.  Psychiatric: She has a normal mood and affect. Her behavior is normal. Judgment and thought content normal.    Data Reviewed Dr Wakefield's note Most recent breast ultrasound  LEFT BREAST ULTRASOUND 12/20/10 Comparison: None  On physical exam, the areola and nipple are erythematous, firm, and  tender. I palpate a firm thickening from the 9 o'clock through the  3 o'clock location of the left breast, inclusive of the nipple.  The left nipple and areola are erythematous compared to the right.  No ulcerations or visible lesions.  Findings: Ultrasound is performed, showing a superficial fluid  collection in the 1 o'clock location of the left breast, 2 cm from  the nipple. This measures 3.0 x 1.3 x 3.1 cm. The periphery of  the collection shows increased blood flow. There is mobile  internal debris within the collection.  IMPRESSION:  Superficial breast abscess in the 1 o'clock location of the left  breast, 2 cm from the nipple. Clinically, this is not improving on  antibiotics by history.    Assessment    Chronic left breast abscess    Plan    It appears the patient has failed nonsurgical management for her chronic left breast abscess. I do not believe there were be any benefit from aspiration. I recommended that the area be surgically incised and debrided in the operating room. We discussed the risk and benefits of surgery including but not limited to bleeding, infection, hematoma formation, seroma formation, scarring, cosmetic issues such as dimpling and, etc.; blood clot formation, nipple sensitivity issues, recurrence, and the  probable need to leave the wound open.  She has elected to proceed to the operating room. She was given a prescription for Ultram.  Rand Boller M. Lindy Pennisi, MD, FACS General, Bariatric, & Minimally Invasive Surgery [facility]        Shakirah Kirkey M 02/17/2011, 4:30 PM    

## 2011-02-22 ENCOUNTER — Encounter (HOSPITAL_COMMUNITY): Payer: Self-pay | Admitting: Pharmacy Technician

## 2011-02-23 ENCOUNTER — Ambulatory Visit (HOSPITAL_COMMUNITY)
Admission: RE | Admit: 2011-02-23 | Discharge: 2011-02-23 | Disposition: A | Discharge status: Home / Self Care | Payer: Medicaid Other | Source: Ambulatory Visit | Attending: General Surgery | Admitting: General Surgery

## 2011-02-23 ENCOUNTER — Encounter (HOSPITAL_COMMUNITY): Payer: Self-pay

## 2011-02-23 ENCOUNTER — Telehealth (INDEPENDENT_AMBULATORY_CARE_PROVIDER_SITE_OTHER): Payer: Self-pay

## 2011-02-23 ENCOUNTER — Encounter (HOSPITAL_COMMUNITY)
Admission: RE | Admit: 2011-02-23 | Discharge: 2011-02-23 | Disposition: A | Discharge status: Home / Self Care | Payer: Medicaid Other | Source: Ambulatory Visit | Attending: General Surgery | Admitting: General Surgery

## 2011-02-23 DIAGNOSIS — J45909 Unspecified asthma, uncomplicated: Secondary | ICD-10-CM | POA: Insufficient documentation

## 2011-02-23 DIAGNOSIS — Z01812 Encounter for preprocedural laboratory examination: Secondary | ICD-10-CM | POA: Insufficient documentation

## 2011-02-23 DIAGNOSIS — Z01818 Encounter for other preprocedural examination: Secondary | ICD-10-CM | POA: Insufficient documentation

## 2011-02-23 DIAGNOSIS — N61 Mastitis without abscess: Secondary | ICD-10-CM | POA: Insufficient documentation

## 2011-02-23 LAB — DIFFERENTIAL
Eosinophils Absolute: 0.7 10*3/uL (ref 0.0–0.7)
Eosinophils Relative: 6 % — ABNORMAL HIGH (ref 0–5)
Lymphs Abs: 4.1 10*3/uL — ABNORMAL HIGH (ref 0.7–4.0)
Monocytes Relative: 5 % (ref 3–12)

## 2011-02-23 LAB — CBC
Hemoglobin: 14 g/dL (ref 12.0–15.0)
MCH: 32.3 pg (ref 26.0–34.0)
MCHC: 33.7 g/dL (ref 30.0–36.0)
MCV: 95.9 fL (ref 78.0–100.0)
RBC: 4.34 MIL/uL (ref 3.87–5.11)

## 2011-02-23 NOTE — Patient Instructions (Signed)
20 [name] [name]  02/23/2011   Your procedure is scheduled on:  02/28/11 0730am-0830am  Report to [facility] at 0530 AM.  Call this number if you have problems the morning of surgery: [phone]   Remember:   Do not eat food:After Midnight.  May have clear liquids:until Midnight .    Take these medicines the morning of surgery with A SIP OF WATER:    Do not wear jewelry, make-up or nail polish.  Do not wear lotions, powders, or perfumes.   Do not shave 48 hours prior to surgery.  Do not bring valuables to the hospital.  Contacts, dentures or bridgework may not be worn into surgery.  Leave suitcase in the car. After surgery it may be brought to your room.  For patients admitted to the hospital, checkout time is 11:00 AM the day of discharge.      Special Instructions: CHG Shower Use Special Wash: 1/2 bottle night before surgery and 1/2 bottle morning of surgery. Shower chin to toes with CHG.  Wash face and private parts with regular soap.    Please read over the following fact sheets that you were given: MRSA Information, coughing and deep breathing exercises, leg exercises

## 2011-02-23 NOTE — Telephone Encounter (Signed)
Norco 5/325 #30 was called in to [facility] and ok'd by Dr. Gaynelle Adu.

## 2011-02-28 ENCOUNTER — Encounter (HOSPITAL_COMMUNITY): Payer: Self-pay | Admitting: Anesthesiology

## 2011-02-28 ENCOUNTER — Ambulatory Visit (HOSPITAL_COMMUNITY)
Admission: RE | Admit: 2011-02-28 | Discharge: 2011-02-28 | Disposition: A | Discharge status: Home / Self Care | Payer: Medicaid Other | Source: Ambulatory Visit | Attending: General Surgery | Admitting: General Surgery

## 2011-02-28 ENCOUNTER — Encounter (HOSPITAL_COMMUNITY)
Admission: RE | Disposition: A | Discharge status: Home / Self Care | Payer: Self-pay | Source: Ambulatory Visit | Attending: General Surgery

## 2011-02-28 ENCOUNTER — Ambulatory Visit (HOSPITAL_COMMUNITY): Payer: Medicaid Other | Admitting: Anesthesiology

## 2011-02-28 ENCOUNTER — Encounter (HOSPITAL_COMMUNITY): Payer: Self-pay | Admitting: *Deleted

## 2011-02-28 DIAGNOSIS — Z79899 Other long term (current) drug therapy: Secondary | ICD-10-CM | POA: Insufficient documentation

## 2011-02-28 DIAGNOSIS — N61 Mastitis without abscess: Secondary | ICD-10-CM

## 2011-02-28 DIAGNOSIS — J45909 Unspecified asthma, uncomplicated: Secondary | ICD-10-CM | POA: Insufficient documentation

## 2011-02-28 SURGERY — INCISION AND DRAINAGE, ABSCESS
Anesthesia: General | Site: Breast | Laterality: Left | Wound class: Dirty or Infected

## 2011-02-28 MED ORDER — ONDANSETRON HCL 4 MG/2ML IJ SOLN: 4.0000 mg | Freq: Four times a day (QID) | INTRAMUSCULAR | Status: DC | PRN

## 2011-02-28 MED ORDER — SODIUM CHLORIDE 0.9 % IJ SOLN: 3.0000 mL | INTRAMUSCULAR | Status: DC | PRN

## 2011-02-28 MED ORDER — SODIUM CHLORIDE 0.9 % IV SOLN: 250.0000 mL | INTRAVENOUS | Status: DC | PRN

## 2011-02-28 MED ORDER — BUPIVACAINE LIPOSOME 1.3 % IJ SUSP
20.0000 mL | Freq: Once | INTRAMUSCULAR | Status: AC
  Administered 2011-02-28: 20 mL
  Filled 2011-02-28: qty 20

## 2011-02-28 MED ORDER — OXYCODONE HCL 5 MG PO TABS: 5.0000 mg | ORAL_TABLET | ORAL | Status: DC | PRN

## 2011-02-28 MED ORDER — LACTATED RINGERS IV SOLN
INTRAVENOUS | Status: DC
  Administered 2011-02-28: 09:00:00 via INTRAVENOUS

## 2011-02-28 MED ORDER — HYDROMORPHONE HCL PF 1 MG/ML IJ SOLN
0.2500 mg | INTRAMUSCULAR | Status: DC | PRN
  Administered 2011-02-28 (×2): 0.5 mg via INTRAVENOUS

## 2011-02-28 MED ORDER — ACETAMINOPHEN 650 MG RE SUPP
650.0000 mg | RECTAL | Status: DC | PRN
  Filled 2011-02-28: qty 1

## 2011-02-28 MED ORDER — PROMETHAZINE HCL 25 MG/ML IJ SOLN: 6.2500 mg | INTRAMUSCULAR | Status: DC | PRN

## 2011-02-28 MED ORDER — OXYCODONE HCL 5 MG PO TABS
ORAL_TABLET | ORAL | Status: AC
  Administered 2011-02-28: 5 mg
  Filled 2011-02-28: qty 1

## 2011-02-28 MED ORDER — CEFAZOLIN SODIUM 1-5 GM-% IV SOLN
INTRAVENOUS | Status: AC
  Filled 2011-02-28: qty 50

## 2011-02-28 MED ORDER — 0.9 % SODIUM CHLORIDE (POUR BTL) OPTIME
TOPICAL | Status: DC | PRN
  Administered 2011-02-28: 1000 mL

## 2011-02-28 MED ORDER — PROMETHAZINE HCL 25 MG/ML IJ SOLN: 12.5000 mg | Freq: Four times a day (QID) | INTRAMUSCULAR | Status: DC | PRN

## 2011-02-28 MED ORDER — ONDANSETRON HCL 4 MG/2ML IJ SOLN
INTRAMUSCULAR | Status: DC | PRN
  Administered 2011-02-28: 4 mg via INTRAVENOUS

## 2011-02-28 MED ORDER — FENTANYL CITRATE 0.05 MG/ML IJ SOLN
INTRAMUSCULAR | Status: DC | PRN
  Administered 2011-02-28 (×5): 50 ug via INTRAVENOUS

## 2011-02-28 MED ORDER — LIDOCAINE HCL (CARDIAC) 20 MG/ML IV SOLN
INTRAVENOUS | Status: DC | PRN
  Administered 2011-02-28: 50 mg via INTRAVENOUS

## 2011-02-28 MED ORDER — OXYCODONE-ACETAMINOPHEN 5-325 MG PO TABS: 1.0000 | ORAL_TABLET | ORAL | Status: AC | PRN

## 2011-02-28 MED ORDER — LACTATED RINGERS IV SOLN
INTRAVENOUS | Status: DC | PRN
  Administered 2011-02-28: 07:00:00 via INTRAVENOUS

## 2011-02-28 MED ORDER — PHENYLEPHRINE HCL 10 MG/ML IJ SOLN
INTRAMUSCULAR | Status: DC | PRN
  Administered 2011-02-28 (×3): 50 ug via INTRAVENOUS

## 2011-02-28 MED ORDER — HYDROMORPHONE HCL PF 1 MG/ML IJ SOLN
INTRAMUSCULAR | Status: AC
  Filled 2011-02-28: qty 2

## 2011-02-28 MED ORDER — MIDAZOLAM HCL 5 MG/5ML IJ SOLN
INTRAMUSCULAR | Status: DC | PRN
  Administered 2011-02-28: 2 mg via INTRAVENOUS

## 2011-02-28 MED ORDER — SODIUM CHLORIDE 0.9 % IJ SOLN: 3.0000 mL | Freq: Two times a day (BID) | INTRAMUSCULAR | Status: DC

## 2011-02-28 MED ORDER — ACETAMINOPHEN 325 MG PO TABS: 650.0000 mg | ORAL_TABLET | ORAL | Status: DC | PRN

## 2011-02-28 MED ORDER — MORPHINE SULFATE 10 MG/ML IJ SOLN: 1.0000 mg | INTRAMUSCULAR | Status: DC | PRN

## 2011-02-28 MED ORDER — CEFAZOLIN SODIUM-DEXTROSE 2-3 GM-% IV SOLR
2.0000 g | INTRAVENOUS | Status: AC
  Administered 2011-02-28: 1 g via INTRAVENOUS

## 2011-02-28 MED ORDER — PROPOFOL 10 MG/ML IV BOLUS
INTRAVENOUS | Status: DC | PRN
  Administered 2011-02-28: 150 mg via INTRAVENOUS
  Administered 2011-02-28: 30 mg via INTRAVENOUS
  Administered 2011-02-28 (×4): 20 mg via INTRAVENOUS

## 2011-02-28 SURGICAL SUPPLY — 35 items
BANDAGE GAUZE ELAST BULKY 4 IN (GAUZE/BANDAGES/DRESSINGS) IMPLANT
BLADE SURG 15 STRL LF DISP TIS (BLADE) ×1 IMPLANT
BLADE SURG 15 STRL SS (BLADE) ×2
CANISTER SUCTION 2500CC (MISCELLANEOUS) ×2 IMPLANT
CLOTH BEACON ORANGE TIMEOUT ST (SAFETY) ×2 IMPLANT
COVER SURGICAL LIGHT HANDLE (MISCELLANEOUS) ×2 IMPLANT
DECANTER SPIKE VIAL GLASS SM (MISCELLANEOUS) IMPLANT
DRAPE LAPAROSCOPIC ABDOMINAL (DRAPES) IMPLANT
DRAPE LAPAROTOMY TRNSV 102X78 (DRAPE) ×1 IMPLANT
DRSG PAD ABDOMINAL 8X10 ST (GAUZE/BANDAGES/DRESSINGS) IMPLANT
ELECT CAUTERY BLADE 6.4 (BLADE) ×2 IMPLANT
ELECT REM PT RETURN 9FT ADLT (ELECTROSURGICAL) ×2
ELECTRODE REM PT RTRN 9FT ADLT (ELECTROSURGICAL) ×1 IMPLANT
GAUZE PACKING IODOFORM 1/4X5 (PACKING) ×1 IMPLANT
GLOVE BIO SURGEON STRL SZ7.5 (GLOVE) ×2 IMPLANT
GOWN STRL NON-REIN LRG LVL3 (GOWN DISPOSABLE) ×4 IMPLANT
KIT BASIN OR (CUSTOM PROCEDURE TRAY) ×2 IMPLANT
NDL HYPO 25X1 1.5 SAFETY (NEEDLE) IMPLANT
NEEDLE HYPO 25X1 1.5 SAFETY (NEEDLE) IMPLANT
NS IRRIG 1000ML POUR BTL (IV SOLUTION) ×2 IMPLANT
PACK GENERAL/GYN (CUSTOM PROCEDURE TRAY) ×1 IMPLANT
PENCIL BUTTON HOLSTER BLD 10FT (ELECTRODE) ×2 IMPLANT
SPONGE GAUZE 4X4 12PLY (GAUZE/BANDAGES/DRESSINGS) ×1 IMPLANT
SPONGE LAP 18X18 X RAY DECT (DISPOSABLE) ×2 IMPLANT
SUT MNCRL AB 4-0 PS2 18 (SUTURE) IMPLANT
SUT VIC AB 3-0 SH 27 (SUTURE)
SUT VIC AB 3-0 SH 27XBRD (SUTURE) IMPLANT
SWAB COLLECTION DEVICE MRSA (MISCELLANEOUS) ×1 IMPLANT
SYR BULB 3OZ (MISCELLANEOUS) ×2 IMPLANT
SYR CONTROL 10ML LL (SYRINGE) ×1 IMPLANT
TAPE CLOTH SURG 4X10 WHT LF (GAUZE/BANDAGES/DRESSINGS) ×1 IMPLANT
TOWEL OR 17X26 10 PK STRL BLUE (TOWEL DISPOSABLE) ×2 IMPLANT
TUBE ANAEROBIC SPECIMEN COL (MISCELLANEOUS) IMPLANT
WATER STERILE IRR 1000ML POUR (IV SOLUTION) IMPLANT
YANKAUER SUCT BULB TIP NO VENT (SUCTIONS) ×2 IMPLANT

## 2011-02-28 NOTE — Anesthesia Postprocedure Evaluation (Signed)
  Anesthesia Post-op Note  Patient: [name] [name]  Procedure(s) Performed: Procedure(s) (LRB): INCISION AND DRAINAGE ABSCESS (Left)  Patient Location: PACU  Anesthesia Type: General  Level of Consciousness: oriented and sedated  Airway and Oxygen Therapy: Patient Spontanous Breathing  Post-op Pain: mild  Post-op Assessment: Post-op Vital signs reviewed, Patient's Cardiovascular Status Stable, Respiratory Function Stable and Patent Airway  Post-op Vital Signs: stable  Complications: No apparent anesthesia complications 

## 2011-02-28 NOTE — H&P (View-Only) (Signed)
Patient ID: [name] [name], female   DOB: 05-Mar-1985, 25 y.o.   MRN: [id number]  Chief Complaint  Patient presents with  . Abscess    eval left breast abscess    HPI [name] [name] is a 26 y.o. female.   HPI 26 year old white female comes in because of worsening left breast pain. She has a history of a left breast abscess. She underwent incision and drainage in our office in December 2012. She just saw Dr. Dwain Sarna a few days ago. They discussed several options. They agreed on a trial of antibiotics. She states that she has been taking the antibiotics. However, she states that she is having more tenderness and soreness in her left breast just above her nipple at the 1:00 position. She denies any fevers or chills. She denies any drainage from her nipple. She states that she is not smoking. She is currently not breast-feeding.   Past Medical History  Diagnosis Date  . Asthma   . Pregnant state, incidental   . Breast abscess     Past Surgical History  Procedure Date  . No past surgeries   . Incise and drain abcess     left breast    Family History  Problem Relation Age of Onset  . Healthy Mother   . Healthy Father   . Stroke Maternal Grandmother   . Stroke Paternal Grandfather     Social History History  Substance Use Topics  . Smoking status: Former Smoker    Quit date: 06/22/2010  . Smokeless tobacco: Never Used  . Alcohol Use: No    No Known Allergies  Current Outpatient Prescriptions  Medication Sig Dispense Refill  . albuterol (PROVENTIL HFA;VENTOLIN HFA) 108 (90 BASE) MCG/ACT inhaler Inhale 2 puffs into the lungs every 6 (six) hours as needed. For wheezing and shortness of breath.       . budesonide (PULMICORT) 180 MCG/ACT inhaler Inhale 1 puff into the lungs 2 (two) times daily.        . Calcium Carbonate Antacid (TUMS PO) Take 1 tablet by mouth daily.      Marland Kitchen doxycycline (ADOXA) 100 MG tablet Take 100 mg by mouth 2 (two) times daily.      . Prenatal Vit-Fe  Fumarate-FA (PRENATAL MULTIVITAMIN) TABS Take 1 tablet by mouth daily.      Marland Kitchen triamcinolone cream (KENALOG) 0.1 % APPLY TOPICALLY TWICE DAILY  30 g  0  . traMADol (ULTRAM) 50 MG tablet Take 1 tablet (50 mg total) by mouth every 6 (six) hours as needed for pain.  30 tablet  0    Review of Systems Review of Systems  Constitutional: Negative for fever, activity change, appetite change and unexpected weight change.  HENT: Negative for neck pain and neck stiffness.   Eyes: Negative for photophobia and visual disturbance.  Respiratory: Negative for apnea, chest tightness and shortness of breath.   Cardiovascular: Negative for chest pain.  Gastrointestinal: Negative for abdominal pain.  Genitourinary: Negative for difficulty urinating.  Musculoskeletal: Negative.   Neurological: Negative for light-headedness and headaches.  Hematological: Negative.   Psychiatric/Behavioral: Negative.     Blood pressure 120/78, pulse 98, temperature 97.8 F (36.6 C), temperature source Temporal, resp. rate 12, height 5' (1.524 m), weight 161 lb (73.029 kg).  Physical Exam Physical Exam  Vitals reviewed. Constitutional: She is oriented to person, place, and time. She appears well-developed and well-nourished. No distress.       obese  HENT:  Head: Normocephalic and atraumatic.  Right Ear:  External ear normal.  Left Ear: External ear normal.  Nose: Nose normal.  Eyes: Conjunctivae are normal. No scleral icterus.  Neck: Normal range of motion. Neck supple. No tracheal deviation present. No thyromegaly present.  Cardiovascular: Normal rate, regular rhythm and normal heart sounds.   Pulmonary/Chest: Effort normal and breath sounds normal. No respiratory distress. She has no wheezes. Right breast exhibits no inverted nipple, no nipple discharge, no skin change and no tenderness. Left breast exhibits tenderness. Left breast exhibits no inverted nipple, no nipple discharge and no skin change.         No  cellulitis. Some TTP in area. Chronic induration. Area is about 3 x3 cm about 2cm from nipple  Abdominal: Soft. Bowel sounds are normal. She exhibits no distension. There is no tenderness.  Musculoskeletal: Normal range of motion.  Neurological: She is alert and oriented to person, place, and time.  Skin: Skin is warm and dry. She is not diaphoretic.  Psychiatric: She has a normal mood and affect. Her behavior is normal. Judgment and thought content normal.    Data Reviewed Dr Doreen Salvage note Most recent breast ultrasound  LEFT BREAST ULTRASOUND 12/20/10 Comparison: None  On physical exam, the areola and nipple are erythematous, firm, and  tender. I palpate a firm thickening from the 9 o'clock through the  3 o'clock location of the left breast, inclusive of the nipple.  The left nipple and areola are erythematous compared to the right.  No ulcerations or visible lesions.  Findings: Ultrasound is performed, showing a superficial fluid  collection in the 1 o'clock location of the left breast, 2 cm from  the nipple. This measures 3.0 x 1.3 x 3.1 cm. The periphery of  the collection shows increased blood flow. There is mobile  internal debris within the collection.  IMPRESSION:  Superficial breast abscess in the 1 o'clock location of the left  breast, 2 cm from the nipple. Clinically, this is not improving on  antibiotics by history.    Assessment    Chronic left breast abscess    Plan    It appears the patient has failed nonsurgical management for her chronic left breast abscess. I do not believe there were be any benefit from aspiration. I recommended that the area be surgically incised and debrided in the operating room. We discussed the risk and benefits of surgery including but not limited to bleeding, infection, hematoma formation, seroma formation, scarring, cosmetic issues such as dimpling and, etc.; blood clot formation, nipple sensitivity issues, recurrence, and the  probable need to leave the wound open.  She has elected to proceed to the operating room. She was given a prescription for Ultram.  Mary Sella. Andrey Campanile, MD, FACS General, Bariatric, & Minimally Invasive Surgery [facility], [place]        [facility] M 02/17/2011, 4:30 PM

## 2011-02-28 NOTE — Anesthesia Preprocedure Evaluation (Signed)
Anesthesia Evaluation  Patient identified by MRN, date of birth, ID band Patient awake    Reviewed: Allergy & Precautions, H&P , NPO status , Patient's Chart, lab work & pertinent test results, reviewed documented beta blocker date and time   Airway Mallampati: II  Neck ROM: Full    Dental  (+) Teeth Intact   Pulmonary asthma ,  clear to auscultation        Cardiovascular neg cardio ROS Regular Normal    Neuro/Psych Negative Neurological ROS  Negative Psych ROS   GI/Hepatic negative GI ROS, Neg liver ROS,   Endo/Other  Negative Endocrine ROS  Renal/GU negative Renal ROS  Genitourinary negative   Musculoskeletal negative musculoskeletal ROS (+)   Abdominal   Peds negative pediatric ROS (+)  Hematology negative hematology ROS (+)   Anesthesia Other Findings   Reproductive/Obstetrics negative OB ROS                           Anesthesia Physical Anesthesia Plan  ASA: II  Anesthesia Plan: General   Post-op Pain Management:    Induction: Intravenous  Airway Management Planned: LMA  Additional Equipment:   Intra-op Plan:   Post-operative Plan: Extubation in OR  Informed Consent: I have reviewed the patients History and Physical, chart, labs and discussed the procedure including the risks, benefits and alternatives for the proposed anesthesia with the patient or authorized representative who has indicated his/her understanding and acceptance.     Plan Discussed with: CRNA and Surgeon  Anesthesia Plan Comments:         Anesthesia Quick Evaluation

## 2011-02-28 NOTE — Brief Op Note (Signed)
02/28/2011  8:15 AM  PATIENT:  [name] [name]  26 y.o. female  PRE-OPERATIVE DIAGNOSIS:  chronic left breast abscess  POST-OPERATIVE DIAGNOSIS:  chronic left breast abscess  PROCEDURE:  Procedure(s) (LRB): INCISION AND DRAINAGE BREAST ABSCESS (Left)  SURGEON:  Surgeon(s) and Role:    * Atilano Ina, MD,FACS - Primary  PHYSICIAN ASSISTANT: none  ASSISTANTS: none   ANESTHESIA:   general  EBL:     BLOOD ADMINISTERED:none  DRAINS: none   LOCAL MEDICATIONS USED:  OTHER 20cc exparel  SPECIMEN:  Source of Specimen:  left breast culture  DISPOSITION OF SPECIMEN:  micro  COUNTS:  YES  TOURNIQUET:  * No tourniquets in log *  DICTATION: .Other Dictation: Dictation Number [id number]  PLAN OF CARE: Discharge to home after PACU  PATIENT DISPOSITION:  PACU - hemodynamically stable.   Delay start of Pharmacological VTE agent (>24hrs) due to surgical blood loss or risk of bleeding: not applicable  Mary Sella. Andrey Campanile, MD, FACS General, Bariatric, & Minimally Invasive Surgery [facility], [place]

## 2011-02-28 NOTE — Interval H&P Note (Signed)
History and Physical Interval Note:  02/28/2011 7:15 AM  [name] [name]  has presented today for surgery, with the diagnosis of chronic left breast abscess  The various methods of treatment have been discussed with the patient and family. After consideration of risks, benefits and other options for treatment, the patient has consented to  Procedure(s) (LRB): INCISION AND DRAINAGE ABSCESS (Left) as a surgical intervention .  The patients' history has been reviewed, patient examined, no change in status, stable for surgery.  I have reviewed the patients' chart and labs.  Questions were answered to the patient's satisfaction.    Mary Sella. Andrey Campanile, MD, FACS General, Bariatric, & Minimally Invasive Surgery [facility], [place]   [facility] M

## 2011-02-28 NOTE — Transfer of Care (Signed)
Immediate Anesthesia Transfer of Care Note  Patient: [name] [name]  Procedure(s) Performed: Procedure(s) (LRB): INCISION AND DRAINAGE ABSCESS (Left)  Patient Location: PACU  Anesthesia Type: General  Level of Consciousness: sedated, patient cooperative and responds to stimulaton  Airway & Oxygen Therapy: Patient Spontanous Breathing and Patient connected to face mask oxgen  Post-op Assessment: Report given to PACU RN and Post -op Vital signs reviewed and stable  Post vital signs: Reviewed and stable  Complications: No apparent anesthesia complications

## 2011-02-28 NOTE — Discharge Instructions (Signed)
Abscess Care After An abscess (also called a boil or furuncle) is an infected area that contains a collection of pus. Signs and symptoms of an abscess include pain, tenderness, redness, or hardness, or you may feel a moveable soft area under your skin. An abscess can occur anywhere in the body. The infection may spread to surrounding tissues causing cellulitis. A cut (incision) by the surgeon was made over your abscess and the pus was drained out. Gauze may have been packed into the space to provide a drain that will allow the cavity to heal from the inside outwards. The boil may be painful for 5 to 7 days. Most people with a boil do not have high fevers. Your abscess, if seen early, may not have localized, and may not have been lanced. If not, another appointment may be required for this if it does not get better on its own or with medications.  HOME CARE INSTRUCTIONS   Only take over-the-counter or prescription medicines for pain, discomfort, or fever as directed by your caregiver.   The wound is packed with a narrow strip of packing gauze then covered with gauze and tape  Leave the bandage on today  On Wednesday when you take a shower, let the warm water loosen the bandage then remove gauze and packing strip. You may then wash the wound gently with mild soapy water. Cover with gauze and some tape.   Change gauze and clean the wound at least once daily.  Expect some bloody drainage today and over the next several days.   SEEK IMMEDIATE MEDICAL CARE IF:   You develop increased pain, swelling, redness, drainage, or bleeding in the wound site.   You develop signs of generalized infection including muscle aches, chills, fever, or a general ill feeling.   An oral temperature above 102 F (38.9 C) develops, not controlled by medication.  See your caregiver for a recheck if you develop any of the symptoms described above. If medications (antibiotics) were prescribed, take them as  directed.  [facility] Office Phone Number [phone]  POST OP INSTRUCTIONS  Always review your discharge instruction sheet given to you by the facility where your surgery was performed.  The clinic staff is available to answer your questions during regular business hours.  Please don't hesitate to call and ask to speak to one of the nurses for clinical concerns.  If you have a medical emergency, go to the nearest emergency room or call 911.  A surgeon from [facility] is always on call at the hospital.  For further questions, please visit [email or web address]

## 2011-02-28 NOTE — Anesthesia Postprocedure Evaluation (Signed)
  Anesthesia Post-op Note  Patient: [name] [name]  Procedure(s) Performed: Procedure(s) (LRB): INCISION AND DRAINAGE ABSCESS (Left)  Patient Location: PACU  Anesthesia Type: General  Level of Consciousness: oriented and sedated  Airway and Oxygen Therapy: Patient Spontanous Breathing  Post-op Pain: mild  Post-op Assessment: Post-op Vital signs reviewed, Patient's Cardiovascular Status Stable, Respiratory Function Stable and Patent Airway  Post-op Vital Signs: stable  Complications: No apparent anesthesia complications

## 2011-03-01 ENCOUNTER — Telehealth (INDEPENDENT_AMBULATORY_CARE_PROVIDER_SITE_OTHER): Payer: Self-pay | Admitting: General Surgery

## 2011-03-01 NOTE — Telephone Encounter (Signed)
Called patient re: po appt. Appt made for 03/08/11 @ 10:15am

## 2011-03-01 NOTE — Telephone Encounter (Signed)
Message copied by Liliana Cline on Wed Mar 01, 2011 11:48 AM ------      Message from: Andrey Campanile, ERIC M      Created: Tue Feb 28, 2011  5:47 PM       That's fine. Just remind me.            Wilson            ----- Message -----         From: Liliana Cline, CMA         Sent: 02/28/2011   3:46 PM           To: Atilano Ina, MD,FACS            Want to start at 8:45 next Wednesday?            ----- Message -----         From: Atilano Ina, MD,FACS         Sent: 02/28/2011   3:35 PM           To: Liliana Cline, CMA            Have pt see me in 1 weeks or so.            Thanks,            Minerva Areola

## 2011-03-01 NOTE — Op Note (Signed)
[name], [name] [name] NO.:  [id number]  MEDICAL RECORD NO.:  [id number]  LOCATION:  [facility]                         FACILITY:  [facility]  PHYSICIAN:  Mary Sella. Andrey Campanile, MD, FACSDATE OF BIRTH:  May 16, 1985  DATE OF PROCEDURE:  02/28/2011 DATE OF DISCHARGE:  02/28/2011                              OPERATIVE REPORT   PREOPERATIVE DIAGNOSIS:  Chronic left breast abscess.  POSTOPERATIVE DIAGNOSIS:  Chronic left breast abscess.  PROCEDURE:  Incision and drainage of chronic left breast abscess.  SURGEON:  Mary Sella. Andrey Campanile, MD, FACS.  ANESTHESIA:  General plus 20 cc of Exparel.  SPECIMEN:  Left breast abscess culture including Gram stain.  ESTIMATED BLOOD LOSS:  Minimal.  INDICATIONS FOR PROCEDURE:  The patient is a very pleasant 26 year old Caucasian female, who had a history of a left breast abscess.  She underwent a small incision and drainage in our office in December 2012. She had had an ultrasound of her breast in mid December, which showed a superficial breast abscess at the 1 o'clock position approximately 2 cm from the nipple.  After she underwent incision and drainage in the office, it appeared that she healed well, but she re-presented to our office in early February 2013, complaining of worsening tenderness and soreness in her left breast around her nipple at the 1 o'clock position. She had palpable induration because this was felt to be a chronic abscess and she was very tender in the office.  I did not feel that she could tolerate an incision and drainage in the office, so I recommended proceeding to the operating room.  We discussed the risks and benefits of surgery, including, but not limited to, bleeding, infection, injury to surrounding structures, decrease in nipple sensitivity, scarring, hematoma formation, seroma formation, breast abscess recurrence, and anesthesia complications.  We discussed typical postoperative care including leaving the incision  open to be healed by secondary intention.  She elected to proceed to the operating room.  DESCRIPTION OF PROCEDURE:  After obtaining informed consent and marking the left breast in the holding area with the patient confirming the operative site, she was taken back to the operating room, placed supine on the operating table.  General endotracheal anesthesia was established.  Sequential compression devices were placed.  Her left chest and breast were prepped in usual standard surgical fashion.  A surgical time-out was performed.  She received antibiotics prior to skin incision.  She had an area of fluctuance and induration in the left nipple-areolar complex.  From the 12 o'clock to 2 o'clock position using a #15 blade, I made a curvilinear incision from the 12 o'clock to 2 o'clock position.  There was gross spillage of pus.  Cultures were obtained.  This pocket of pus was superficial.  However, I did incise the underlying breast tissue to ensure that it did not track deeply and it did not.  I probed the wound to make sure it did not track any further laterally or inferiorly and it did not.  Hemostasis was achieved with electrocautery.  The wound was irrigated.  20 cc of Exparel was injected in the field block.  Quarter inch iodoform gauze was packed into the cavity covered  by 4x4's and paper tape.  The patient tolerated the procedure well.  There were no immediate complications.  All needle, instrument, and sponge counts were correct x2.     Mary Sella. Andrey Campanile, MD, FACS     EMW/MEDQ  D:  02/28/2011  T:  03/01/2011  Job:  [id number]  cc:   Pearlean Brownie, [name]

## 2011-03-03 LAB — CULTURE, ROUTINE-ABSCESS

## 2011-03-06 ENCOUNTER — Other Ambulatory Visit: Payer: Self-pay | Admitting: Family Medicine

## 2011-03-07 ENCOUNTER — Encounter (INDEPENDENT_AMBULATORY_CARE_PROVIDER_SITE_OTHER): Payer: Medicaid Other | Admitting: General Surgery

## 2011-03-07 NOTE — Telephone Encounter (Signed)
Refill request

## 2011-03-08 ENCOUNTER — Encounter (INDEPENDENT_AMBULATORY_CARE_PROVIDER_SITE_OTHER): Payer: Medicaid Other | Admitting: General Surgery

## 2011-03-10 ENCOUNTER — Ambulatory Visit (INDEPENDENT_AMBULATORY_CARE_PROVIDER_SITE_OTHER): Payer: Medicaid Other | Admitting: General Surgery

## 2011-03-10 ENCOUNTER — Encounter (INDEPENDENT_AMBULATORY_CARE_PROVIDER_SITE_OTHER): Payer: Self-pay | Admitting: General Surgery

## 2011-03-10 VITALS — BP 116/76 | HR 92 | Temp 97.8°F | Resp 18 | Ht 60.0 in | Wt 157.6 lb

## 2011-03-10 DIAGNOSIS — Z09 Encounter for follow-up examination after completed treatment for conditions other than malignant neoplasm: Secondary | ICD-10-CM

## 2011-03-10 NOTE — Patient Instructions (Signed)
Place the corner of a gauze between the skin edges and cover with a dry gauze. Changed daily. Wash and clean the wound daily with soap and water.

## 2011-03-10 NOTE — Progress Notes (Signed)
Chief complaint: Postop  Procedure: Status post incision and drainage of left breast superficial abscess February 26  History of Present Ilness: 26 year old Caucasian female comes in today for her first postoperative appointment.  She reports that she is doing much better after surgery. She denies any fevers or chills. She states that she still has some tenderness in the breast. She reports some intermittent yellow tinge drainage. She is not packing the wound.  Physical Exam: BP 116/76  Pulse 92  Temp(Src) 97.8 F (36.6 C) (Temporal)  Resp 18  Ht 5' (1.524 m)  Wt 157 lb 9.6 oz (71.487 kg)  BMI 30.78 kg/m2  L breast - skin incision from 12-2, no cellulitis, no induration, no necrotic tissue, no fluctuance. Depth about 4mm.  Pathology: No growth from cultures  Assessment and Plan: Status post incision and drainage of left breast superficial abscess  I think overall the wound looks fine. There is no sign of infection. I encouraged her to wick the skin edges with a corner of a gauze. She was given wound care instructions. Followup 4 weeks  Mary Sella. Andrey Campanile, MD, FACS General, Bariatric, & Minimally Invasive Surgery [facility] [place]

## 2011-03-15 ENCOUNTER — Telehealth: Payer: Self-pay | Admitting: Family Medicine

## 2011-03-15 NOTE — Telephone Encounter (Addendum)
PATIENTS NEEDS A WRITTEN SCRIPT FOR HER INHALER TO SEND IN TO THE MEDICINE ASSISTANCE COMP.

## 2011-03-17 ENCOUNTER — Encounter (INDEPENDENT_AMBULATORY_CARE_PROVIDER_SITE_OTHER): Payer: Medicaid Other | Admitting: General Surgery

## 2011-03-21 ENCOUNTER — Other Ambulatory Visit: Payer: Self-pay | Admitting: Family Medicine

## 2011-03-21 MED ORDER — ALBUTEROL SULFATE HFA 108 (90 BASE) MCG/ACT IN AERS: 2.0000 | INHALATION_SPRAY | Freq: Four times a day (QID) | RESPIRATORY_TRACT | Status: DC | PRN

## 2011-03-21 NOTE — Telephone Encounter (Signed)
LMOVM informing pt...............................Olivette Beckmann, CMA  

## 2011-03-21 NOTE — Telephone Encounter (Signed)
I printed RX and left in envelope up front Please let her know Thanks LC

## 2011-03-24 ENCOUNTER — Encounter: Payer: Self-pay | Admitting: Family Medicine

## 2011-03-24 ENCOUNTER — Ambulatory Visit (INDEPENDENT_AMBULATORY_CARE_PROVIDER_SITE_OTHER): Payer: Medicaid Other | Admitting: Family Medicine

## 2011-03-24 VITALS — BP 110/72 | HR 118 | Temp 98.1°F | Ht 60.0 in | Wt 155.0 lb

## 2011-03-24 DIAGNOSIS — M654 Radial styloid tenosynovitis [de Quervain]: Secondary | ICD-10-CM | POA: Insufficient documentation

## 2011-03-24 DIAGNOSIS — F32A Depression, unspecified: Secondary | ICD-10-CM | POA: Insufficient documentation

## 2011-03-24 DIAGNOSIS — F329 Major depressive disorder, single episode, unspecified: Secondary | ICD-10-CM | POA: Insufficient documentation

## 2011-03-24 DIAGNOSIS — F53 Postpartum depression: Secondary | ICD-10-CM

## 2011-03-24 DIAGNOSIS — IMO0002 Reserved for concepts with insufficient information to code with codable children: Secondary | ICD-10-CM

## 2011-03-24 MED ORDER — MISC. DEVICES MISC: Status: DC

## 2011-03-24 MED ORDER — CITALOPRAM HYDROBROMIDE 20 MG PO TABS: 20.0000 mg | ORAL_TABLET | Freq: Every day | ORAL | Status: DC

## 2011-03-24 NOTE — Patient Instructions (Addendum)
For your thumb--try the splint and motrin 2-3 times a day You should also try icing it for about 10 mins a few times a day for the next few days  Let Dr. Deirdre Priest reexamine it at your next appt  For the mood-I am giving you a handout about [facility] resources. [facility] also has some resources. We will start celexa today CALL or go to the ER if you have thoughts of hurting yourself or others

## 2011-03-26 NOTE — Assessment & Plan Note (Signed)
Pt with 2 days of s/s consistent with de quervains 8 weeks postpartum. Advised thumb spica, motrin.  See PCP in 2 weeks for recheck.  Consider injection if not improved.

## 2011-03-26 NOTE — Assessment & Plan Note (Signed)
2 weeks of increasingly worse mood, irritability.  phq 9 score is 19.  Denies SI/HI.  8 weeks postpartum.  Gave info for [facility] support group.  Start paxil today.  Gave red flags to call or go to ED.  See back with PCP in 2 weeks.

## 2011-03-26 NOTE — Progress Notes (Signed)
  Subjective:    Patient ID: [name] [name], female    DOB: 02/02/1985, 26 y.o.   MRN: [id number]  HPI Thumb pain- pt with 2 days of increasing pain in her left thumb and wrist.  She is 8 weeks postpartum and frequently lifts the baby.  She has noticed pain with opening jars or any movement on that hand especially picking up baby. No swelling.  No injury.  1 year ago had similar pain lasting 2 weeks resolved without treatment. No other joint pain, no fever.   Mood- pt with 2 weeks of increasing irritability, depressed mood.  Snaps at fiance and child.  Was fine for first 6 weeks pp.  Sister had episode of major depression after childbirth 7 years ago.  No personal hx of depression.  Feels she has support at home.  No new BC method.  No SI/HI.  Feels safe at home.     Review of Systems Denies CP, SOB, HA, N/V/D, fever   Objective:   Physical Exam  Vital signs reviewed General appearance - alert, well appearing, and in no distress MSK- finkelstein positive on left. No swelling or erythema.  Full strength at fingers and wrist.  Neurovascularly intact PSYCH- normal speech, thought patterns.  Well groomed.  Not tearful or anxious appearing.      Assessment & Plan:

## 2011-03-29 ENCOUNTER — Ambulatory Visit: Payer: Medicaid Other | Admitting: Family Medicine

## 2011-04-04 ENCOUNTER — Encounter (INDEPENDENT_AMBULATORY_CARE_PROVIDER_SITE_OTHER): Payer: Medicaid Other | Admitting: General Surgery

## 2011-04-05 ENCOUNTER — Encounter (INDEPENDENT_AMBULATORY_CARE_PROVIDER_SITE_OTHER): Payer: Self-pay | Admitting: General Surgery

## 2011-04-10 ENCOUNTER — Telehealth (INDEPENDENT_AMBULATORY_CARE_PROVIDER_SITE_OTHER): Payer: Self-pay | Admitting: General Surgery

## 2011-04-10 NOTE — Telephone Encounter (Signed)
Pt calling for appt.  She had breast abscess removed in February by Dr. Andrey Campanile.  The abscess has reoccurred and she needs to be worked into his schedule, please.

## 2011-04-10 NOTE — Telephone Encounter (Signed)
If she has an active abscess, she needs to be worked into urgent office.

## 2011-04-11 ENCOUNTER — Encounter (INDEPENDENT_AMBULATORY_CARE_PROVIDER_SITE_OTHER): Payer: Self-pay | Admitting: General Surgery

## 2011-04-11 ENCOUNTER — Ambulatory Visit
Admission: RE | Admit: 2011-04-11 | Discharge: 2011-04-11 | Disposition: A | Discharge status: Home / Self Care | Payer: Medicaid Other | Source: Ambulatory Visit | Attending: General Surgery | Admitting: General Surgery

## 2011-04-11 ENCOUNTER — Ambulatory Visit (INDEPENDENT_AMBULATORY_CARE_PROVIDER_SITE_OTHER): Payer: Medicaid Other | Admitting: General Surgery

## 2011-04-11 VITALS — BP 116/74 | HR 68 | Temp 97.4°F | Resp 18 | Ht 60.0 in | Wt 157.2 lb

## 2011-04-11 DIAGNOSIS — N644 Mastodynia: Secondary | ICD-10-CM

## 2011-04-11 DIAGNOSIS — N611 Abscess of the breast and nipple: Secondary | ICD-10-CM

## 2011-04-11 DIAGNOSIS — N61 Mastitis without abscess: Secondary | ICD-10-CM

## 2011-04-11 NOTE — Progress Notes (Signed)
Subjective:     Patient ID: [name] [name], female   DOB: 02-13-1985, 26 y.o.   MRN: [id number]  HPI This 26 year old Caucasian female returns for evaluation of recurrent pain and swelling in the left breast.  She has a past history of left breast abscess, including some difficulty with eradication. Dr. Andrey Campanile took her to the operating room on February 28, 2011 and incision and drainage of left breast abscess through a circumareolar incision in the upper outer quadrant. He drained gross pus. Unfortunately, the culture showed no growth. She did get better and the skin healed over.  She saw Dr. Andrey Campanile on March 8 and she was doing well. She now presents with a two-week history of tenderness and one week history of swelling. She has not had any spontaneous draining at this time. She has never had spontaneous drainage from her breast skin.  Review of Systems     Objective:   Physical Exam A pleasant young woman. She has her newborn infant with her.  Breast: Left breast is examined. The incision in the upper outer circumareolar area is well healed. There is tenderness and fullness inferiorly in the subareolar area but minimal erythema. This is not ready fluctuant.    Assessment:     Left breast pain. Suspect she is developing another bacterial infection.  History recent incision and drainage left breast abscess, February 28, 2011.    Plan:     I discussed my clinical impression with her.  Start clindamycin 600 mg p.o. q.6 hours  Ultrasound left breast today  Return to office tomorrow to see Dr. Andrey Campanile for definitive management. I told her that there was a fluid collection that another drainage procedure would  Probably be required.   Angelia Mould. Derrell Lolling, [name], [facility] General and Minimally invasive Surgery Breast and Colorectal Surgery Office:   [phone] Pager:   [phone]

## 2011-04-11 NOTE — Patient Instructions (Signed)
I suspect you are developing recurrent infection in the left breast. Take the antibiotics that I gave you starting tonight. You will be scheduled for an ultrasound of the left breast.  He will be scheduled to see Dr. Gaynelle Adu tomorrow in followup.

## 2011-04-12 ENCOUNTER — Telehealth (INDEPENDENT_AMBULATORY_CARE_PROVIDER_SITE_OTHER): Payer: Self-pay

## 2011-04-12 ENCOUNTER — Telehealth (INDEPENDENT_AMBULATORY_CARE_PROVIDER_SITE_OTHER): Payer: Self-pay | Admitting: General Surgery

## 2011-04-12 ENCOUNTER — Ambulatory Visit (INDEPENDENT_AMBULATORY_CARE_PROVIDER_SITE_OTHER): Payer: Medicaid Other | Admitting: General Surgery

## 2011-04-12 ENCOUNTER — Encounter (INDEPENDENT_AMBULATORY_CARE_PROVIDER_SITE_OTHER): Payer: Self-pay | Admitting: General Surgery

## 2011-04-12 VITALS — BP 118/70 | HR 100 | Resp 16 | Ht 60.0 in | Wt 155.6 lb

## 2011-04-12 DIAGNOSIS — N61 Mastitis without abscess: Secondary | ICD-10-CM

## 2011-04-12 MED ORDER — IBUPROFEN 800 MG PO TABS: 800.0000 mg | ORAL_TABLET | Freq: Three times a day (TID) | ORAL | Status: DC | PRN

## 2011-04-12 MED ORDER — HYDROCODONE-ACETAMINOPHEN 5-500 MG PO TABS: 1.0000 | ORAL_TABLET | Freq: Every day | ORAL | Status: DC

## 2011-04-12 NOTE — Telephone Encounter (Signed)
Appt made 04/26/11 @ 12:00 pm.

## 2011-04-12 NOTE — Telephone Encounter (Signed)
The patient called and stated the Vicodin is causing nausea and vomiting.  She would like something different like Percocet.  Dr Andrey Campanile is off so I got an order from Dr Biagio Quint for Percocet 5/325 1po q4-6hrs prn pain #30 no refill.  The pt will pick up the rx.  I told her about her follow up appt on 4/24 at 12pm.

## 2011-04-12 NOTE — Progress Notes (Signed)
Subjective:     Patient ID: [name] [name], female   DOB: 02-17-1985, 26 y.o.   MRN: [id number]  HPI 26 yo WF s/p I & D Of left breast nipple areolar abscess on February 26. I last saw her on March 8. She missed her followup appointment with me last week. She saw one of my partners yesterday in our urgent office. Apparently, she has had 2 weeks of left breast pain and one week of left breast swelling. The tenderness and soreness is mainly in the inferior aspect of her breast. She denies any fevers. She does endorse some chills. She denies any drainage. She has not been breast-feeding or lactating. She had a left breast ultrasound performed yesterday.  Review of Systems     Objective:   Physical Exam BP 118/70  Pulse 100  Resp 16  Ht 5' (1.524 m)  Wt 155 lb 9.6 oz (70.58 kg)  BMI 30.39 kg/m2  LMP 03/24/2011 Wd, wn WF in nad Obese Left breast - scar in left breast NAC  From 1-3; some scattered splotchy erythema on left breast mainly in upper quad. Has tenderness, firmness, induration of NAC from 5-7. No cellulitis. No fluctuance.     Assessment:     Left breast mastitis    Plan:     U/s showed no abscess just skin thickening, induration. Dr Derrell Lolling gave her a 10 rx of clindamycin. She hasn't started that yet. I told her to get rx filled. She also states motrin 800 is helping with the discomfort. I gave her a rx for vicodin and a refill on motrin. i encouraged her to use the motrin for anti-inflammation. She was given educational material regarding mastitis. F/u 2 weeks.  Mary Sella. Andrey Campanile, MD, FACS General, Bariatric, & Minimally Invasive Surgery [facility], [place]

## 2011-04-12 NOTE — Patient Instructions (Signed)
Mastitis   Mastitis is a bacterial infection of the breast tissue.  CAUSES   Bacteria causes infection by entering the breast tissue through cuts or openings in the skin. Typically, this occurs with breastfeeding due to cracked or irritated skin. It can be associated with plugged ducts. Nipple piercing can also lead to mastitis.  SYMPTOMS   In mastitis, an area of the breast becomes swollen, red, tender, and painful. You may notice you have a fever and swelling of the glands under your arm on that side. If the infection is allowed to progress, a collection of pus (abscess) may develop.  DIAGNOSIS   Your caregiver can diagnose mastitis based on your symptoms and upon examination. The diagnosis can be confirmed if pus can be expressed from the breast. This pus can be examined in the lab to determine which bacteria are present. If an abscess has developed, the fluid in the abscess can be removed with a needle. This is used to confirm the diagnosis and determine the bacteria present. In most cases, pus will not be present. Blood tests can be done to determine if your body is fighting a bacterial infection. Sometimes, a mammogram or ultrasound will be recommended to exclude other breast diseases including cancer.  Other rare forms of mastitis:   Tuberculosis mastitis is rare. The TB germ can affect the breast if it is present in some other part of the body. The breast may be slightly tender with a mass, but not tender or painful.   Syphilis of the nipple usually has an ulcer that is not tender.   Actinomycosis is a very rare bacterial infection of the breast that presents as a mass in the breast that is not tender or painful.   Phlebitis (inflammation of blood vessels) of the breast is an inflammation of the veins in the breast. It may be caused by tight fitting bras, surgery, or trauma to the breast.   Inflammatory carcinoma of the breast looks like mastitis because the breasts are red, swollen, or tender, but it  is a rare form of breast cancer.  TREATMENT   Antibiotic medication is used to treat the bacterial infection. Your caregiver will determine which bacteria are most likely to be causing the infection and select an antibiotic. This is sometimes changed based on the results of cultures, or if there is no response to the antibiotic selected. Antibiotics are usually given by mouth. If you are breastfeeding, it is important to continue to empty the breast. Your caregiver can tell you whether or not this milk is safe for your infant, or needs to be thrown away. Pain can usually be treated with medication.  HOME CARE INSTRUCTIONS    Take your antibiotics as directed. Finish them even if you start to feel better.   Only take over-the-counter or prescription medication for pain, discomfort, or fever as directed by your caregiver.   If breastfeeding, keep your nipples clean and dry. Your caregiver may tell you to stop nursing until he or she feels it is safe for your baby. Use a breast pump as instructed if forced to stop nursing.   Do not wear a tight bra. Wear a good support bra.   Empty the first breast completely before going to the other breast. If your baby is not emptying your breasts completely for some reason, use a breast pump to empty your breasts.   If you go back to work, pump your breasts while at work to stay in time   with your nursing schedule.   Increase your fluid intake especially if you have a fever.   Avoid having your breasts get overly filled with milk (engorged).  SEEK MEDICAL CARE IF:    You develop pus-like (purulent) discharge from the breast.   Your symptoms get worse.   You do not seem to be responding to your treatment within 2 days.  SEEK IMMEDIATE MEDICAL CARE IF:    You have a fever.   Your pain and swelling is getting worse.   You develop pain that is not controlled with medicine.   You develop a red line extending from the breast toward your armpit.  Document Released:  12/19/2004 Document Revised: 12/08/2010 Document Reviewed: 08/09/2007  [employer] Patient Information 2012 [employer].

## 2011-04-14 ENCOUNTER — Telehealth (INDEPENDENT_AMBULATORY_CARE_PROVIDER_SITE_OTHER): Payer: Self-pay | Admitting: General Surgery

## 2011-04-14 NOTE — Telephone Encounter (Signed)
SPoke with patient and made aware of below instructions. SHe will keep follow up in two weeks and call with any problems prior.

## 2011-04-14 NOTE — Telephone Encounter (Signed)
Per Dr Andrey Campanile, if incision has opened up she does need to begin repacking, just enough to keep skin edges separated. Change once daily. If nipple discharge we will re-discuss with Dr Andrey Campanile. Called patient, left message for her to call me back.

## 2011-04-14 NOTE — Telephone Encounter (Signed)
Pt calling to report she woke today draining "bloody, white stuff" through her sports bra and nightshirt.  She is on antibiotics, but is asking if she needs to begin packing the area again.  Instructed her to cover with dry dressing for now and will ask physician about the packing.  Call her at [phone].

## 2011-04-26 ENCOUNTER — Encounter (INDEPENDENT_AMBULATORY_CARE_PROVIDER_SITE_OTHER): Payer: Self-pay | Admitting: General Surgery

## 2011-04-26 ENCOUNTER — Ambulatory Visit (INDEPENDENT_AMBULATORY_CARE_PROVIDER_SITE_OTHER): Payer: Medicaid Other | Admitting: General Surgery

## 2011-04-26 VITALS — BP 124/76 | HR 118 | Temp 98.2°F | Ht 60.0 in | Wt 153.6 lb

## 2011-04-26 DIAGNOSIS — Z09 Encounter for follow-up examination after completed treatment for conditions other than malignant neoplasm: Secondary | ICD-10-CM

## 2011-04-26 DIAGNOSIS — N61 Mastitis without abscess: Secondary | ICD-10-CM

## 2011-04-26 DIAGNOSIS — N611 Abscess of the breast and nipple: Secondary | ICD-10-CM

## 2011-04-26 NOTE — Progress Notes (Signed)
Chief complaint: Postop  Procedure: Status post incision and drainage left breast abscess February 26  History of Present Ilness: 26 year old Caucasian female comes in for followup. I last saw her on April 10. At that time her abscess had resolved; however, she had really bad mastitis. She had had a repeat ultrasound which demonstrated just induration and edema without any focal fluid collection. She had been placed on clindamycin. 2 days after her visit, she states that she workup with a large amount of drainage from her old incision. After it drained spontaneously her breast pain dramatically improved and actually resolved. She denies any fevers or chills. She denies any breast tenderness or pain. She is no longer taking any narcotics. She is no longer taking the antibiotics.  Physical Exam: BP 124/76  Pulse 118  Temp(Src) 98.2 F (36.8 C) (Temporal)  Ht 5' (1.524 m)  Wt 153 lb 9.6 oz (69.673 kg)  BMI 30.00 kg/m2  SpO2 97%  Alert, nad, smiling Left breast - healed incision at NAC from 1-3. No induration, cellulitis, or fluctuance.  nontender on exam.   Assessment and Plan: 26 year old female status post incision and drainage of left breast abscess and mastitis  There is no signs of active infection. It appears to have resolved. I advised her that she is at risk for possible recurrence sometime in the future. Followup p.r.n.  Mary Sella. Andrey Campanile, MD, FACS General, Bariatric, & Minimally Invasive Surgery [facility], [place]

## 2011-04-29 ENCOUNTER — Other Ambulatory Visit: Payer: Self-pay | Admitting: Family Medicine

## 2011-05-04 ENCOUNTER — Telehealth: Payer: Self-pay | Admitting: Family Medicine

## 2011-05-04 NOTE — Telephone Encounter (Signed)
Think that the BCP are "messing" with the antidepressant meds that she was given.  Not sure what to do and wants to speak with nurse.  pls call on Friday morning.

## 2011-05-05 NOTE — Telephone Encounter (Signed)
Pt states that since she started the OCP she feels her mood swings are worse.  Spoke with Dr. Deirdre Priest he would like to see her.  LMOVM for pt that I scheduled appt but for her to call back if that appt does not work for her. Haedyn Breau, [name]

## 2011-05-05 NOTE — Telephone Encounter (Signed)
Pt returned call

## 2011-05-05 NOTE — Telephone Encounter (Signed)
LMOVM asking pt to call back. [name] [name], [name]

## 2011-05-08 ENCOUNTER — Ambulatory Visit (INDEPENDENT_AMBULATORY_CARE_PROVIDER_SITE_OTHER): Payer: Medicaid Other | Admitting: Family Medicine

## 2011-05-08 ENCOUNTER — Encounter: Payer: Self-pay | Admitting: Family Medicine

## 2011-05-08 VITALS — BP 134/75 | HR 93 | Temp 98.4°F | Ht 60.0 in | Wt 154.0 lb

## 2011-05-08 DIAGNOSIS — IMO0002 Reserved for concepts with insufficient information to code with codable children: Secondary | ICD-10-CM

## 2011-05-08 DIAGNOSIS — O99345 Other mental disorders complicating the puerperium: Secondary | ICD-10-CM

## 2011-05-08 NOTE — Assessment & Plan Note (Signed)
Worsened.  Perhaps due to OCPs.  Discussed options.  Decided to go with increased citalopram and continue the OCP to see if possible related depression improves as she finishes one pack.  Will check TSH since has not had one since delivery

## 2011-05-08 NOTE — Patient Instructions (Signed)
Take 1.5 tablets of celexa every day  Keep taking the sprintec one a day  Come back in 1 month  I will call you if your lab tests are not normal.  Otherwise we will discuss them at your next visit.  If you are feeling a lot worse then call me

## 2011-05-08 NOTE — Progress Notes (Signed)
  Subjective:    Patient ID: [name] [name], female    DOB: 19-Dec-1985, 26 y.o.   MRN: [id number]  HPI  Depression Worsened over the last 2 weeks.  She relates this to starting Sprintec for [facility].   Mainly irritable and fatigued.  No homicidal or suidical ideation.  Appetite down a little.  Sleepign well.  Taking celexa 20 mg daily which really helped when first started.  Now feels like it just stopped working  PMH - has been on multiple forms of BC.  Recently a mirena fell out.  Depo made her irritable and very irregular periods.  Was on OCP years ago does not remember mood effects   Review of Systems     Objective:   Physical Exam  Alert interactive.  Normals thoughts, well groomed      Assessment & Plan:

## 2011-05-09 ENCOUNTER — Encounter: Payer: Self-pay | Admitting: Family Medicine

## 2011-05-24 ENCOUNTER — Other Ambulatory Visit: Payer: Self-pay | Admitting: Family Medicine

## 2011-06-07 ENCOUNTER — Encounter: Payer: Self-pay | Admitting: Family Medicine

## 2011-06-07 ENCOUNTER — Ambulatory Visit (INDEPENDENT_AMBULATORY_CARE_PROVIDER_SITE_OTHER): Payer: Medicaid Other | Admitting: Family Medicine

## 2011-06-07 VITALS — BP 129/81 | HR 108 | Temp 98.4°F | Ht 60.0 in | Wt 153.0 lb

## 2011-06-07 DIAGNOSIS — IMO0002 Reserved for concepts with insufficient information to code with codable children: Secondary | ICD-10-CM

## 2011-06-07 DIAGNOSIS — F53 Postpartum depression: Secondary | ICD-10-CM

## 2011-06-07 NOTE — Assessment & Plan Note (Signed)
Unchanged.  Will continue to increase citalopram since it does seem to help

## 2011-06-07 NOTE — Progress Notes (Signed)
  Subjective:    Patient ID: [name] [name], female    DOB: March 12, 1985, 26 y.o.   MRN: [id number]  HPI Depression Did not notice any improvement with increased dose of citalopram.  Does feel worse when misses it however.  No longer on OCPs had nexaplanon placed 2 weeks or so ago.  No crying spells or suicide thoughts but is more crabby than usual.  No weight losss or skin changes   Review of Symptoms - see HPI  PMH - Smoking status noted.     Review of Systems     Objective:   Physical Exam Alert well groomed Holding Arthur her infant good interactions        Assessment & Plan:

## 2011-06-07 NOTE — Patient Instructions (Signed)
Increase celexa to 40 mg daily  If in 4 weeks (July 5) you are feeling better then take the 40 mg for 2 more months then start to decrease.   If you are not feeling better in 4 weeks then call and we will switch to another medication

## 2011-06-14 ENCOUNTER — Encounter (HOSPITAL_COMMUNITY): Payer: Self-pay | Admitting: Emergency Medicine

## 2011-06-14 ENCOUNTER — Emergency Department (HOSPITAL_COMMUNITY)
Admission: EM | Admit: 2011-06-14 | Discharge: 2011-06-15 | Disposition: A | Discharge status: Home / Self Care | Payer: Medicaid Other | Attending: Emergency Medicine | Admitting: Emergency Medicine

## 2011-06-14 ENCOUNTER — Emergency Department (HOSPITAL_COMMUNITY): Payer: Medicaid Other

## 2011-06-14 ENCOUNTER — Ambulatory Visit: Payer: Medicaid Other | Admitting: Family Medicine

## 2011-06-14 DIAGNOSIS — R509 Fever, unspecified: Secondary | ICD-10-CM | POA: Insufficient documentation

## 2011-06-14 DIAGNOSIS — J45901 Unspecified asthma with (acute) exacerbation: Secondary | ICD-10-CM | POA: Insufficient documentation

## 2011-06-14 DIAGNOSIS — IMO0001 Reserved for inherently not codable concepts without codable children: Secondary | ICD-10-CM | POA: Insufficient documentation

## 2011-06-14 DIAGNOSIS — R000 Tachycardia, unspecified: Secondary | ICD-10-CM | POA: Insufficient documentation

## 2011-06-14 DIAGNOSIS — J069 Acute upper respiratory infection, unspecified: Secondary | ICD-10-CM | POA: Insufficient documentation

## 2011-06-14 DIAGNOSIS — R0602 Shortness of breath: Secondary | ICD-10-CM | POA: Insufficient documentation

## 2011-06-14 MED ORDER — IPRATROPIUM BROMIDE 0.02 % IN SOLN
0.5000 mg | Freq: Once | RESPIRATORY_TRACT | Status: AC
  Administered 2011-06-14: 0.5 mg via RESPIRATORY_TRACT
  Filled 2011-06-14 (×2): qty 2.5

## 2011-06-14 MED ORDER — IPRATROPIUM BROMIDE 0.02 % IN SOLN
0.5000 mg | Freq: Once | RESPIRATORY_TRACT | Status: AC
  Administered 2011-06-14: 0.5 mg via RESPIRATORY_TRACT
  Filled 2011-06-14: qty 2.5

## 2011-06-14 MED ORDER — METHYLPREDNISOLONE SODIUM SUCC 125 MG IJ SOLR
125.0000 mg | Freq: Once | INTRAMUSCULAR | Status: AC
  Administered 2011-06-14: 125 mg via INTRAVENOUS
  Filled 2011-06-14: qty 2

## 2011-06-14 MED ORDER — ALBUTEROL SULFATE (5 MG/ML) 0.5% IN NEBU
5.0000 mg | INHALATION_SOLUTION | Freq: Once | RESPIRATORY_TRACT | Status: AC
  Administered 2011-06-14: 5 mg via RESPIRATORY_TRACT
  Filled 2011-06-14: qty 0.5

## 2011-06-14 MED ORDER — SODIUM CHLORIDE 0.9 % IV BOLUS (SEPSIS)
1000.0000 mL | Freq: Once | INTRAVENOUS | Status: AC
  Administered 2011-06-14: 1000 mL via INTRAVENOUS

## 2011-06-14 MED ORDER — ALBUTEROL SULFATE (5 MG/ML) 0.5% IN NEBU
5.0000 mg | INHALATION_SOLUTION | Freq: Once | RESPIRATORY_TRACT | Status: AC
  Administered 2011-06-14: 5 mg via RESPIRATORY_TRACT
  Filled 2011-06-14: qty 1

## 2011-06-14 NOTE — ED Provider Notes (Signed)
History     CSN: [id number]  Arrival date & time 06/14/11  1953   First MD Initiated Contact with Patient 06/14/11 2118      Chief Complaint  Patient presents with  . Asthma    (Consider location/radiation/quality/duration/timing/severity/associated sxs/prior treatment) HPI Comments: Patient here with a week history of cold symptoms including runny nose, nasal congestion, body aches, sore throat, non-productive cough presents tonight with worsening of asthma symptoms.  She reports that she is having to use her inhaler more frequently than usual, states audible wheezing and dry and hacking cough - has tried OTC medication without relief of symptoms as well as her inhaler.  Patient is a 26 y.o. female presenting with asthma. The history is provided by the patient. No language interpreter was used.  Asthma This is a new problem. The current episode started in the past 7 days. The problem occurs constantly. The problem has been unchanged. Associated symptoms include congestion, coughing, a fever and myalgias. Pertinent negatives include no abdominal pain, anorexia, arthralgias, change in bowel habit, chest pain, chills, diaphoresis, fatigue, headaches, joint swelling, nausea, neck pain, numbness, rash, sore throat, swollen glands, urinary symptoms, vertigo, visual change, vomiting or weakness. Nothing aggravates the symptoms. She has tried nothing for the symptoms. The treatment provided no relief.    Past Medical History  Diagnosis Date  . Asthma   . Pregnant state, incidental   . Breast abscess   . GERD (gastroesophageal reflux disease)     Past Surgical History  Procedure Date  . No past surgeries   . Incise and drain abcess     left breast    Family History  Problem Relation Age of Onset  . Healthy Mother   . Healthy Father   . Stroke Maternal Grandmother   . Stroke Paternal Grandfather     History  Substance Use Topics  . Smoking status: Former Smoker    Quit date:  06/22/2010  . Smokeless tobacco: Never Used  . Alcohol Use: No    OB History    Grav Para Term Preterm Abortions TAB SAB Ect Mult Living   1 1  1      1       Review of Systems  Constitutional: Positive for fever. Negative for chills, diaphoresis and fatigue.  HENT: Positive for congestion. Negative for sore throat and neck pain.   Respiratory: Positive for cough.   Cardiovascular: Negative for chest pain.  Gastrointestinal: Negative for nausea, vomiting, abdominal pain, anorexia and change in bowel habit.  Musculoskeletal: Positive for myalgias. Negative for joint swelling and arthralgias.  Skin: Negative for rash.  Neurological: Negative for vertigo, weakness, numbness and headaches.  All other systems reviewed and are negative.    Allergies  Review of patient's allergies indicates no known allergies.  Home Medications   Current Outpatient Rx  Name Route Sig Dispense Refill  . ALBUTEROL SULFATE HFA 108 (90 BASE) MCG/ACT IN AERS Inhalation Inhale 2 puffs into the lungs every 6 (six) hours as needed. For wheezing    . BUDESONIDE 180 MCG/ACT IN AEPB Inhalation Inhale 1 puff into the lungs 2 (two) times daily.    Marland Kitchen CITALOPRAM HYDROBROMIDE 20 MG PO TABS Oral Take 2 tablets (40 mg total) by mouth daily. 30 tablet 3  . IBUPROFEN 800 MG PO TABS Oral Take 1 tablet (800 mg total) by mouth every 8 (eight) hours as needed. Pain 30 tablet 1  . TRIAMCINOLONE ACETONIDE 0.1 % EX CREA Topical Apply 1 application topically  2 (two) times daily.      BP 106/63  Pulse 122  Temp 99 F (37.2 C) (Oral)  Resp 18  SpO2 97%  Physical Exam  Nursing note and vitals reviewed. Constitutional: She is oriented to person, place, and time. She appears well-developed and well-nourished. No distress.  HENT:  Head: Normocephalic and atraumatic.  Right Ear: External ear normal.  Left Ear: External ear normal.  Mouth/Throat: No oropharyngeal exudate.       Boggy nasal mucosa, clear rhinorrhea, mild  posterior pharyngeal erythema.  Eyes: Conjunctivae are normal. Pupils are equal, round, and reactive to light. No scleral icterus.  Neck: Normal range of motion. Neck supple.  Cardiovascular: Regular rhythm and normal heart sounds.  Exam reveals no gallop and no friction rub.   No murmur heard.      tachycardia  Pulmonary/Chest: Effort normal. No respiratory distress. She has wheezes. She has no rales. She exhibits no tenderness.       Mild expiratory RUL wheeze  Abdominal: Soft. Bowel sounds are normal. She exhibits no distension. There is no tenderness.  Musculoskeletal: Normal range of motion. She exhibits no edema and no tenderness.  Lymphadenopathy:    She has no cervical adenopathy.  Neurological: She is alert and oriented to person, place, and time. No cranial nerve deficit.  Skin: Skin is warm and dry. No rash noted. No erythema. No pallor.  Psychiatric: She has a normal mood and affect. Her behavior is normal. Judgment and thought content normal.    ED Course  Procedures (including critical care time)  Labs Reviewed - No data to display Dg Chest 2 View  06/14/2011  *RADIOLOGY REPORT*  Clinical Data: History of asthma with shortness of breath.  CHEST - 2 VIEW  Comparison: 02/23/2011  Findings: Midline trachea.  Normal heart size and mediastinal contours. No pleural effusion or pneumothorax.  Mild interstitial thickening is slightly lower lobe predominant. No lobar consolidation.  IMPRESSION: Interstitial thickening is likely related to the clinical history of asthma. No acute superimposed process.  Original Report Authenticated By: Consuello Bossier, [name]     URI Asthma exacerbation    MDM  Patient with acute exacerbation of asthma after likely viral URI.  Marked improvement in symptoms after second breathing treatment with complete resolution of expiratory wheezing and symptomatic relief of chest tightness.  Will give an inhaler here and start of steroids.       Izola Price Nov[place], [place] 06/15/11 [phone]

## 2011-06-14 NOTE — ED Notes (Signed)
Pt st's she had cold symptoms 1 week ago st's now she feels like it is effecting her asthma.  Pt st's  She uses inhalers but has been using them to often and is almost out.  Also c/o cough

## 2011-06-14 NOTE — ED Notes (Signed)
Received report, pt. Resting on stretcher, NAD noted 

## 2011-06-15 MED ORDER — PREDNISONE 10 MG PO TABS: 40.0000 mg | ORAL_TABLET | Freq: Every day | ORAL | Status: DC

## 2011-06-15 MED ORDER — ALBUTEROL SULFATE HFA 108 (90 BASE) MCG/ACT IN AERS
2.0000 | INHALATION_SPRAY | Freq: Once | RESPIRATORY_TRACT | Status: AC
  Administered 2011-06-15: 2 via RESPIRATORY_TRACT
  Filled 2011-06-15: qty 6.7

## 2011-06-15 MED ORDER — HYDROCOD POLST-CHLORPHEN POLST 10-8 MG/5ML PO LQCR: 5.0000 mL | Freq: Two times a day (BID) | ORAL | Status: DC

## 2011-06-15 NOTE — ED Notes (Signed)
Pt. Discharged to home, pt. Alert and oriented, gait steady, respirations even and regular

## 2011-06-15 NOTE — Discharge Instructions (Signed)
Asthma, Adult Asthma is caused by narrowing of the air passages in the lungs. It may be triggered by pollen, dust, animal dander, molds, some foods, respiratory infections, exposure to smoke, exercise, emotional stress or other allergens (things that cause allergic reactions or allergies). Repeat attacks are common. HOME CARE INSTRUCTIONS   Use prescription medications as ordered by your caregiver.   Avoid pollen, dust, animal dander, molds, smoke and other things that cause attacks at home and at work.   You may have fewer attacks if you decrease dust in your home. Electrostatic air cleaners may help.   It may help to replace your pillows or mattress with materials less likely to cause allergies.   Talk to your caregiver about an action plan for managing asthma attacks at home, including, the use of a peak flow meter which measures the severity of your asthma attack. An action plan can help minimize or stop the attack without having to seek medical care.   If you are not on a fluid restriction, drink 8 to 10 glasses of water each day.   Always have a plan prepared for seeking medical attention, including, calling your physician, accessing local emergency care, and calling 911 (in the [place] for a severe attack.   Discuss possible exercise routines with your caregiver.   If animal dander is the cause of asthma, you may need to get rid of pets.  SEEK MEDICAL CARE IF:   You have wheezing and shortness of breath even if taking medicine to prevent attacks.   You have muscle aches, chest pain or thickening of sputum.   Your sputum changes from clear or white to yellow, green, gray, or bloody.   You have any problems that may be related to the medicine you are taking (such as a rash, itching, swelling or trouble breathing).  SEEK IMMEDIATE MEDICAL CARE IF:   Your usual medicines do not stop your wheezing or there is increased coughing and/or shortness of breath.   You have increased  difficulty breathing.   You have a fever.  MAKE SURE YOU:   Understand these instructions.   Will watch your condition.   Will get help right away if you are not doing well or get worse.  Document Released: 12/19/2004 Document Revised: 12/08/2010 Document Reviewed: 08/07/2007 ExitCare Patient Information 2012 [employer].Asthma, F.L.A.R.E. Most people with asthma do not get so sick that they need emergency care. The fact that you had to get emergency care may mean:  You are not taking your long-term control medicine the right way.   You have not been prescribed any or enough long-term control medicine.   You are still exposed to triggers that start your asthma symptoms.  You can avoid asthma flare-ups by using this F.L.A.R.E. plan until you see your primary doctor. F FOLLOW UP WITH YOUR PRIMARY DOCTOR - CALL TO MAKE AN APPOINTMENT TO BE SEEN.  If you have trouble making an appointment, ask to speak to the office nurse.   If you do not have a primary care doctor, call to get one.  At the follow-up appointment:  Bring all of your medications and this plan with you.   Make an asthma action plan with your doctor that you can follow every day to keep your asthma under control.   Write down your questions and your doctor's answers.  This will make your emergency visits rare. L LEARN ABOUT YOUR ASTHMA MEDICINES. TAKE ALL OF THESE MEDICINES JUST AS THE DOCTOR TELLS YOU,   EVEN IF YOU ARE FEELING MUCH BETTER. Quick-relief or Rescue  Kind of medicine   Name of medicine   How much   How often & how long you need to take it  Long-term control  Kind of medicine   Name of medicine   How much   How often & how long you need to take it  Steroid pills or syrup  Kind of medicine   Name of medicine   How much   How often & how long you need to take it  A ASTHMA IS A LIFE-LONG (CHRONIC) DISEASE. Even though your breathing is better after getting emergency care, you  still need to get long-term control of your asthma. If you do not, you are at risk for more severe flare-ups and even death.  If you use quick-relief medicine more than 2 times per week, your asthma is not under control. You need to see your doctor or an asthma specialist to make a plan to get control of your asthma.   Take long-term control medicine every day as ordered by your doctor.   Figure out what things make your asthma flare up and try to stay away from these "triggers."  R RESPOND TO THESE WARNING SIGNS THAT YOUR ASTHMA IS GETTING WORSE:  Your chest feels tight.   You are short of breath.   You are wheezing.   You are coughing.   Your peak flow is getting low.  Keep taking your medicines as prescribed and call your doctor. E EMERGENCY CARE MAY BE NEEDED IF YOU:  Have trouble talking.   Are working hard to breathe (may see skin sucking in at rib cage or above breast bone).   Need to use quick-relief medicine more often than every 4 hours.   See your peak flow dropping.  Take your quick-relief medicine and wait 20 minutes. If you do not feel better, take it again and wait 20 minutes. If you still do not feel better, take it again and call your doctor or 911 right away! LEARN ABOUT ASTHMA Asthma is a life-long disease that can make it hard for you to get air in and out of your lungs. Your asthma triggers make the air tubes in your lungs get smaller. These are the tubes that carry air in and out of your lungs. Here is what happens:  Small breathing tubes in your lungs swell and make extra mucus.   Muscles around the small breathing tubes get tight and make them smaller.   Smaller breathing tubes then get clogged with the extra mucus.   Swelling, muscle tightness, and mucus make it harder for you to breathe. You start to cough and wheeze and your chest might feel tight.  Not all asthma flare-ups are the same. Some are worse than others. In a severe asthma flare-up, the  breathing tubes get so small that air cannot get in and out of the lungs. People can die if their asthma flare-up is severe. ASTHMA MEDICINES  Quick-relief or Rescue medicine: should help for about 4 hours, relaxes the muscles around your breathing tubes so air can get in and out. If you need to take quick-relief medicine more than 2 times per week, your asthma is not under control, and you should ask your doctor about long-term control medicine.   Long-term control medicine: must be taken every day in order to work right. It keeps your breathing tubes from swelling, and can prevent most asthma flare-ups. This medicine cannot   stop a flare-up once it starts. During flare-ups, use quick-relief medicine right away and take your long-term control medicine as usual.   Steroid pills or syrup: can help the swelling in your breathing tubes go away. You must take this medicine just as the doctor tells you to. Do not skip a dose, and do not stop taking it unless a doctor tells you to stop.  TRIGGERS: TELL YOUR DOCTOR ABOUT THE THINGS THAT MAKE YOUR ASTHMA WORSE. What started or triggered your asthma flare-up this time? COMMON ASTHMA TRIGGERS:  Breathing in chemicals, dusts, or fumes at work.   Colds or flu.   Animals.   Dust.   Pollen and mold.   Strong odors.   Weather.   Exercise.   Cigarette and other smoke.   Medicines.  Smoking and secondhand smoke are asthma triggers. If you smoke, choose to quit. Never let others smoke near you or your children. Call your doctor or your health plan for help quitting. FOR MORE INFORMATION Asthma Initiative of [place]: www.GetAsthmaHelp.org American Lung Association: www.lungusa.org Asthma and Allergy Foundation of America: www.aafa.org This plan and asthma information are based on the NAEPP Guidelines for the Diagnosis and Management of Asthma, (http://www.nhlbi.nig.gov/guidelines/asthma/asthgdln.htm). Document Released: 07/27/2005 Document Revised:  12/08/2010 Document Reviewed: 10/05/2005 ExitCare Patient Information 2012 [employer]. 

## 2011-06-16 NOTE — ED Provider Notes (Signed)
Medical screening examination/treatment/procedure(s) were performed by non-physician practitioner and as supervising physician I was immediately available for consultation/collaboration.    Nelia Shi, MD 06/16/11 1556

## 2011-06-19 ENCOUNTER — Encounter: Payer: Self-pay | Admitting: Family Medicine

## 2011-06-19 ENCOUNTER — Ambulatory Visit (INDEPENDENT_AMBULATORY_CARE_PROVIDER_SITE_OTHER): Payer: Medicaid Other | Admitting: Family Medicine

## 2011-06-19 VITALS — BP 118/77 | HR 82 | Temp 98.1°F | Ht 60.0 in | Wt 156.0 lb

## 2011-06-19 DIAGNOSIS — J45901 Unspecified asthma with (acute) exacerbation: Secondary | ICD-10-CM

## 2011-06-19 MED ORDER — METHYLPREDNISOLONE SODIUM SUCC 1000 MG IJ SOLR: 80.0000 mg | Freq: Once | INTRAMUSCULAR | Status: DC

## 2011-06-19 MED ORDER — PREDNISONE 10 MG PO TABS: ORAL_TABLET | ORAL | Status: AC

## 2011-06-19 MED ORDER — DOXYCYCLINE HYCLATE 100 MG PO TABS: 100.0000 mg | ORAL_TABLET | Freq: Two times a day (BID) | ORAL | Status: AC

## 2011-06-19 MED ORDER — METHYLPREDNISOLONE ACETATE 80 MG/ML IJ SUSP
80.0000 mg | Freq: Once | INTRAMUSCULAR | Status: AC
  Administered 2011-06-19: 80 mg via INTRAMUSCULAR

## 2011-06-19 NOTE — Assessment & Plan Note (Addendum)
A: persistent symptoms associated with asthma exacerbation. Additionally, patient has more pronounced R sided wheezing concerning for pneumonia vs. bronchiits. She is afebrile. Normal pulse and BP.  Chest tightness but no pain to suggest PE.  P: -80 mg IM solumedrol today. -steroid taper per AVS x 10 days -doxy per AVS x 10 days  -continue albuterol q 6 prn with instructions to space as tolerated.  -reviewed s/s that should prompt patient to return to care as per AVS.

## 2011-06-19 NOTE — Patient Instructions (Addendum)
Adore,  Thank you for coming in to see me today. For your asthma exacerbation we will do the following: 1. Doxycycline 100 mg BID x 10 days 2. Prednisone taper  80 mg IM today, then  Day 1: 50 mg.  Day 2-3: 40 mg  Day 4-6: 30 mg  Day 7-10: 20 mg   3. Continue albuterol every 6 hrs as needed, space as tolerated.   Watch out for increased albuterol need > 4 hrs, fever, worsening SOB. As these are all signs that you are heading in the wrong direction and will need to come back.   Dr. Armen Pickup

## 2011-06-19 NOTE — Progress Notes (Signed)
Subjective:     Patient ID: [name] [name], female   DOB: 1985-04-25, 26 y.o.   MRN: [id number]  HPI 26 yo F presents for ED F/u and persistent symptoms associated with URI/Asthma exacerbation. She was evaluated [facility] 06[facility] for  7 days of persistent URI symptoms of shortness breath. She received neb treatment x 2 and had a CXR that revealed signs of chronic bronchial thickening consistent with asthma but no focal infiltrated. She was sent home with instructions to continue albuterol and pulmicort inhalers and on a 5 day 40 mg prednisone taper. Since her ED visit she reports persistent symptoms of mild sore throat, shortness of breath, non-productive cough and intermittent wheezing. She feels very fatigued. She denies fever. She has completed her course of steroids. She is using her albuterol inhaler every 6 hours.   History reviewed: asthma since childhood, last hospitalized at age 75, never had to be intubated. Quit smoking one year ago.   Review of Systems As per HPI   Objective:   Physical Exam BP 118/77  Pulse 82  Temp 98.1 F (36.7 C) (Oral)  Ht 5' (1.524 m)  Wt 156 lb (70.761 kg)  BMI 30.47 kg/m2  SpO2 95% General appearance: alert, cooperative and mild distress Head: Normocephalic, without obvious abnormality, atraumatic Eyes: conjunctivae/corneas clear. PERRL, EOM's intact. Fundi benign. Ears: normal TM's and external ear canals both ears Nose: Nares normal. Septum midline. Mucosa normal. No drainage or sinus tenderness. Throat: lips, mucosa, and tongue normal; teeth and gums normal Back: symmetrical.  Lungs: decreased BS bilaterally. Wheezing appreciated greater on R side. No crackles, rales or rhonchi.  Heart: regular rate and rhythm, S1, S2 normal, no murmur, click, rub or gallop Skin: Skin color, texture, turgor normal. No rashes or lesions Neurologic: Grossly normal  Assessment and Plan:  See problem list

## 2011-07-18 ENCOUNTER — Ambulatory Visit: Payer: Medicaid Other | Admitting: Family Medicine

## 2011-07-19 ENCOUNTER — Encounter: Payer: Self-pay | Admitting: Family Medicine

## 2011-07-19 ENCOUNTER — Ambulatory Visit (INDEPENDENT_AMBULATORY_CARE_PROVIDER_SITE_OTHER): Payer: Medicaid Other | Admitting: Family Medicine

## 2011-07-19 VITALS — BP 112/78 | HR 105 | Temp 98.9°F

## 2011-07-19 DIAGNOSIS — N912 Amenorrhea, unspecified: Secondary | ICD-10-CM

## 2011-07-19 DIAGNOSIS — IMO0002 Reserved for concepts with insufficient information to code with codable children: Secondary | ICD-10-CM

## 2011-07-19 DIAGNOSIS — R42 Dizziness and giddiness: Secondary | ICD-10-CM

## 2011-07-19 DIAGNOSIS — F53 Postpartum depression: Secondary | ICD-10-CM

## 2011-07-19 LAB — POCT URINE PREGNANCY: Preg Test, Ur: NEGATIVE

## 2011-07-19 MED ORDER — MECLIZINE HCL 25 MG PO TABS: 12.5000 mg | ORAL_TABLET | Freq: Three times a day (TID) | ORAL | Status: AC | PRN

## 2011-07-19 MED ORDER — MECLIZINE HCL 25 MG PO CHEW: 12.5000 mg | CHEWABLE_TABLET | Freq: Four times a day (QID) | ORAL | Status: DC | PRN

## 2011-07-19 NOTE — Assessment & Plan Note (Signed)
Improved on celexa 40 mg daily

## 2011-07-19 NOTE — Progress Notes (Signed)
  Subjective:    Patient ID: VONDELL SOWELL, female    DOB: 12-Jun-1985, 26 y.o.   MRN: [id number]  HPI  Dizziness For last 3 weeks or so episodes of lightheadness vs vertigo.  Was first with just standing but now gets when sitting or lying. Possibly things spin.  No loc or focal weakness or visual changes or chest pain or shortness of breath.  No ear fullness or ringing or URI symptoms  Did have mild left foot lateral tingling.  No new medications.  Her recent cbc are normal.     Post Partum Depression Much improved  Asthma Not using budesonide but no symptoms currently  Review of Symptoms - see HPI  PMH - Smoking status noted.    Review of Systems     Objective:   Physical Exam Alert no acute distress Neurologic exam : Cn 2-7 intact Strength equal & normal in upper & lower extremities Able to walk on heels and toes.   Balance normal  Romberg normal, finger to nose  PERRL EOMI  Ears:  External ear exam shows no significant lesions or deformities.  Otoscopic examination reveals clear canals, tympanic membranes are intact bilaterally without bulging, retraction, inflammation or discharge. Hearing is grossly normal bilaterall        Assessment & Plan:

## 2011-07-19 NOTE — Assessment & Plan Note (Signed)
New.  No specific cause identified.  No evidence of cardiac or CNS lesion or hypovolemia or neurologic disease.  Perhaps labyrinth disorder.  Trial of antivert

## 2011-07-19 NOTE — Patient Instructions (Addendum)
If the lightheadness becomes severe or if you have chest pain or weakness in an arm or leg or trouble seeing or if not much better in 2 weeks then come back  Try the meclizine if you have a bad attack  Take your budesonide twice daily EVERY DAY

## 2011-09-14 ENCOUNTER — Ambulatory Visit
Admission: RE | Admit: 2011-09-14 | Discharge: 2011-09-14 | Disposition: A | Discharge status: Home / Self Care | Payer: Medicaid Other | Source: Ambulatory Visit | Attending: Family Medicine | Admitting: Family Medicine

## 2011-09-14 ENCOUNTER — Telehealth: Payer: Self-pay | Admitting: Family Medicine

## 2011-09-14 ENCOUNTER — Ambulatory Visit (INDEPENDENT_AMBULATORY_CARE_PROVIDER_SITE_OTHER): Payer: Medicaid Other | Admitting: Family Medicine

## 2011-09-14 ENCOUNTER — Encounter: Payer: Self-pay | Admitting: Family Medicine

## 2011-09-14 VITALS — BP 120/66 | HR 100 | Temp 98.4°F | Wt 153.2 lb

## 2011-09-14 DIAGNOSIS — S59909A Unspecified injury of unspecified elbow, initial encounter: Secondary | ICD-10-CM

## 2011-09-14 DIAGNOSIS — S6991XA Unspecified injury of right wrist, hand and finger(s), initial encounter: Secondary | ICD-10-CM

## 2011-09-14 MED ORDER — TRAMADOL HCL 50 MG PO TABS: 50.0000 mg | ORAL_TABLET | Freq: Three times a day (TID) | ORAL | Status: DC | PRN

## 2011-09-14 NOTE — Telephone Encounter (Signed)
Notes Recorded by Hilarie Fredrickson, MD on 09/14/2011 at 4:11 PM Called patient to let her know there is no fracture. She will follow up in 2 weeks. Romeo Zielinski M. Harutyun Monteverde, [name]

## 2011-09-14 NOTE — Assessment & Plan Note (Signed)
S/p fall on open hands on 09/13/11. Decreased ROM but no noticeable deformity or red flags. Will immobilize with wrist splint. Right hand and wrist Xrays have been ordered at [facility]. If these show an acute fracture, will arrange appt with Ortho. If negative, [name] [name] should use ice, brace and Tramadol for pain. RTC in 2 weeks for follow up. Would consider reimaging in 2 weeks since some fractures are not evident immediately.

## 2011-09-14 NOTE — Patient Instructions (Addendum)
I am sorry you have injured your wrist. For now, continue to use the splint, ice and the Tramadol for pain. Please have your wrist X-rayed. If it shows a fracture, we will call you to arrange an appointment with the orthopedist.  Please make an appointment to come back in 2 weeks for follow up.  Let me know if you need anything else! Take care [employer]. Kioni Stahl, [name]

## 2011-09-14 NOTE — Progress Notes (Signed)
Subjective:     Patient ID: [name] [name], female   DOB: Jan 15, 1985, 26 y.o.   MRN: [id number]  HPI WRIST PAIN  Location: Right Wrist; mostly on top of hand and ulnar surface of wrist  Quality: Constant Onset: 15 hours ago Worse with: Turning wrist or picking up daughter Better with: Nothing. Tried IBU Radiation: Up arm.  Trauma: Patient states she has a new puppy and she tripped over dog last night around 11:00pm. She had been drinking alcohol but remembers the event with no LOC. Larey Seat forward and tried to catch herself. Her right wrist hurt some last night, but more unbearable this morning.   Red Flags Unable to move wrist: limited movement  Numbness/Weakness: no  Fever/chills/sweats: no  Pain out of proportion to exam: no  h/o cancer/immunosuppression: no  IV drug use: no   History reviewed: Former Smoker  Review of Systems See HPI above    Objective:   Physical Exam General: Awake and alert, NAD HEENT: AT, [place] Heart: RRR Lungs: CTAB Ext: LEFT hand with mild bruising of palm. RIGHT hand without obvious bruising, no swelling. Good pulses. Good cap refill. Fingers warm and able to move fingers well. No sensory loss. ROM severely limited. Cannot flex/extend/rotate without significant pain.    Assessment:     26 yo F with wrist injury    Plan:     See Problem List

## 2011-09-15 ENCOUNTER — Telehealth: Payer: Self-pay | Admitting: Family Medicine

## 2011-09-15 NOTE — Telephone Encounter (Signed)
Patient states she has ben taking tramadol 2 tabs every eight hours and still not helping pain. Paged Dr. Mikel Cella.

## 2011-09-15 NOTE — Telephone Encounter (Signed)
Spoke with Dr. Mikel Cella and she states patient can take 600 mg Ibuprofen every 8 hours. She is instructed to take only one tramadol and Dr. Mikel Cella will not give any other pain meds.  Patient notified . States she is already taking ibuprofen for the inflammation.

## 2011-09-15 NOTE — Telephone Encounter (Signed)
Pt was seen yesterday for wrist pain - she states that the pain meds is not working and wants to speak to nurse about what else she can do.,

## 2011-09-19 ENCOUNTER — Other Ambulatory Visit: Payer: Self-pay | Admitting: Family Medicine

## 2011-09-20 ENCOUNTER — Encounter (INDEPENDENT_AMBULATORY_CARE_PROVIDER_SITE_OTHER): Payer: Self-pay | Admitting: General Surgery

## 2011-09-25 ENCOUNTER — Ambulatory Visit: Payer: Medicaid Other | Admitting: Family Medicine

## 2011-09-27 ENCOUNTER — Ambulatory Visit: Payer: Medicaid Other | Admitting: Family Medicine

## 2011-10-25 ENCOUNTER — Ambulatory Visit: Payer: Medicaid Other | Admitting: Family Medicine

## 2011-10-30 ENCOUNTER — Ambulatory Visit (INDEPENDENT_AMBULATORY_CARE_PROVIDER_SITE_OTHER): Payer: Medicaid Other | Admitting: Family Medicine

## 2011-10-30 ENCOUNTER — Encounter: Payer: Self-pay | Admitting: Family Medicine

## 2011-10-30 VITALS — BP 120/78 | HR 97 | Temp 98.1°F | Ht 60.0 in | Wt 159.0 lb

## 2011-10-30 DIAGNOSIS — F329 Major depressive disorder, single episode, unspecified: Secondary | ICD-10-CM

## 2011-10-30 DIAGNOSIS — J45909 Unspecified asthma, uncomplicated: Secondary | ICD-10-CM

## 2011-10-30 DIAGNOSIS — L2089 Other atopic dermatitis: Secondary | ICD-10-CM

## 2011-10-30 DIAGNOSIS — F53 Postpartum depression: Secondary | ICD-10-CM

## 2011-10-30 DIAGNOSIS — O99345 Other mental disorders complicating the puerperium: Secondary | ICD-10-CM

## 2011-10-30 MED ORDER — PREDNISONE 20 MG PO TABS: 40.0000 mg | ORAL_TABLET | Freq: Every day | ORAL | Status: DC

## 2011-10-30 MED ORDER — TRIAMCINOLONE ACETONIDE 0.1 % EX OINT: TOPICAL_OINTMENT | Freq: Two times a day (BID) | CUTANEOUS | Status: DC

## 2011-10-30 NOTE — Progress Notes (Signed)
  Subjective:    Patient ID: [name] [name], female    DOB: 01-24-1985, 26 y.o.   MRN: [id number]  HPI  Asthma Using her albuterol multiple times a day for the last few days associated with wheezing and mild shortness of breath with exertion.  Has not been using her pulmicort regularly.  No fever or edema  Exzema Really bad for the last 3 days itchy dry skin on extrem and abdomen.  Using hydrocortisone but has nothing stronger.  No fever or vesicles  Depression Feeling tired and down - celexa not working any more.  Has tried to stop it but feels very lightheaded No suicidal ideation and is not severely down but just fatigued and wants to stop celexa.  Review of Symptoms - see HPI  PMH - Smoking status noted.      Review of Systems     Objective:   Physical Exam Alert no acute distress normal conversation Heart - Regular rate and rhythm.  No murmurs, gallops or rubs.    Lung - diffuse mild expiratory wheeze throughout no dullness Skin - diffuse dry scaly skin with excoriations worse on flexure surfaces of arm and across abdomen No wheepign or tenderness        Assessment & Plan:

## 2011-10-30 NOTE — Patient Instructions (Addendum)
Take the prednisone until gone  Use the pulmicort twice daily every single day  Use the ointment twice daily until rash is better then once a day and the as needed  Once asthma is good and you are off the prednisone for 7 days then try to taper the citalopram  1/4 tablet every 3-4 days  Come back in 3-4 weeks  Call if the asthma gets any worse

## 2011-10-30 NOTE — Assessment & Plan Note (Signed)
Worsened - oral and topical steroid and humidification

## 2011-10-30 NOTE — Assessment & Plan Note (Signed)
Worsened.  Treat with prednisone and emphasize need for preventer every day

## 2011-10-30 NOTE — Assessment & Plan Note (Signed)
Worsened slightly.  Once over asthma flare will attempt to taper celexa and consider another agent

## 2011-11-08 ENCOUNTER — Telehealth: Payer: Self-pay | Admitting: Family Medicine

## 2011-11-08 DIAGNOSIS — J45909 Unspecified asthma, uncomplicated: Secondary | ICD-10-CM

## 2011-11-08 MED ORDER — PREDNISONE 20 MG PO TABS: 40.0000 mg | ORAL_TABLET | Freq: Every day | ORAL | Status: DC

## 2011-11-08 NOTE — Telephone Encounter (Signed)
Unfortunately I think she needs an office visit to make sure she does not have an infection and is using her inhalers correctly If she can not get an appointment today then please call in Prednisone Rx  Prednisone 20 mg tabs 2 oral daily #6 tabs until she can get an appointment tomorrow or Friday  Thanks Lc

## 2011-11-08 NOTE — Telephone Encounter (Signed)
Spoke with her.  Not feeling worse but not back to normal Using pulmicort twice daily.  Will treat with 3 more days of steroids and recheck on Monday if not back to normal

## 2011-11-08 NOTE — Telephone Encounter (Signed)
Pt is agreeable to appointment, but would really like to see you.  Double booked for Monday @ 830am.  Please et me know if that needs to change.  Pt did not ask for refills on steroids and is ok with waiting till Monday. Delbert Darley, [name]

## 2011-11-08 NOTE — Telephone Encounter (Signed)
Is asking for another round of steroids - still not better

## 2011-11-13 ENCOUNTER — Ambulatory Visit: Payer: Medicaid Other | Admitting: Family Medicine

## 2011-11-15 ENCOUNTER — Telehealth (INDEPENDENT_AMBULATORY_CARE_PROVIDER_SITE_OTHER): Payer: Self-pay

## 2011-11-15 NOTE — Telephone Encounter (Signed)
Pt called stating she is having soreness and swelling in breast. She states she was seen in past for breast infection/ abscess by Dr Andrey Campanile. Pt concerned the breast abscess is returning. Pt given appt to be seen in our add on clinic.

## 2011-11-16 ENCOUNTER — Encounter (INDEPENDENT_AMBULATORY_CARE_PROVIDER_SITE_OTHER): Payer: Self-pay | Admitting: Surgery

## 2011-11-16 ENCOUNTER — Ambulatory Visit (INDEPENDENT_AMBULATORY_CARE_PROVIDER_SITE_OTHER): Payer: Medicaid Other | Admitting: Surgery

## 2011-11-16 VITALS — BP 124/78 | HR 100 | Temp 97.4°F | Resp 18 | Ht 60.0 in | Wt 156.0 lb

## 2011-11-16 DIAGNOSIS — N61 Mastitis without abscess: Secondary | ICD-10-CM

## 2011-11-16 DIAGNOSIS — N611 Abscess of the breast and nipple: Secondary | ICD-10-CM

## 2011-11-16 MED ORDER — OXYCODONE-ACETAMINOPHEN 5-325 MG PO TABS: 1.0000 | ORAL_TABLET | ORAL | Status: DC | PRN

## 2011-11-16 MED ORDER — DOXYCYCLINE HYCLATE 100 MG PO TABS: 100.0000 mg | ORAL_TABLET | Freq: Two times a day (BID) | ORAL | Status: DC

## 2011-11-16 MED ORDER — HYDROCODONE-ACETAMINOPHEN 5-325 MG PO TABS: 1.0000 | ORAL_TABLET | ORAL | Status: DC | PRN

## 2011-11-16 NOTE — Progress Notes (Signed)
NAME: [name] [name] DOB: 10/13/1985 MRN: [id number]                                                                                      DATE: 11/16/2011  PCP: Carney Living, MD Referring Provider: Carney Living, *  IMPRESSION:  Mastitis, left breast, possible developing abscess 1:30 position  PLAN:   Will start warm compresses, doxycycline 100 twice a day, obtain ultrasound to see if there is a drainable collection, and gave her prescription for some Percocet 5/325 #30                 CC:  Chief Complaint  Patient presents with  . Abscess    breast (left)    HPI:  [name] [name] is a 26 y.o.  female who presents for evaluation of another left breast abscess. She wanted was drained in the operating room about 6 months ago. She has recurring areas of inflammation but this is the worst it has been developed last couple of days. She is to the left breast is swollen red and tender especially in the area of the nipple areolar. She comes to the urgent office for evaluation.  PMH:  has a past medical history of Asthma; Pregnant state, incidental; Breast abscess; and GERD (gastroesophageal reflux disease).  PSH:   has past surgical history that includes No past surgeries and Incise and drain abcess.  ALLERGIES:  No Known Allergies  MEDICATIONS: Current outpatient prescriptions:albuterol (PROVENTIL HFA;VENTOLIN HFA) 108 (90 BASE) MCG/ACT inhaler, Inhale 2 puffs into the lungs every 6 (six) hours as needed. For wheezing, Disp: , Rfl: ;  budesonide (PULMICORT) 180 MCG/ACT inhaler, Inhale 1 puff into the lungs 2 (two) times daily., Disp: , Rfl: ;  citalopram (CELEXA) 20 MG tablet, , Disp: , Rfl:  ibuprofen (ADVIL,MOTRIN) 800 MG tablet, Take 1 tablet (800 mg total) by mouth every 8 (eight) hours as needed. Pain, Disp: 30 tablet, Rfl: 1;  triamcinolone ointment (KENALOG) 0.1 %, Apply topically 2 (two) times daily., Disp: 90 g, Rfl: 2;  doxycycline (VIBRA-TABS) 100 MG tablet, Take  1 tablet (100 mg total) by mouth 2 (two) times daily. For infection, Disp: 28 tablet, Rfl: 0 oxyCODONE-acetaminophen (ROXICET) 5-325 MG per tablet, Take 1 tablet by mouth every 4 (four) hours as needed for pain., Disp: 30 tablet, Rfl: 0 . EXAM:   Vital signs:BP 124/78  Pulse 100  Temp 97.4 F (36.3 C) (Temporal)  Resp 18  Ht 5' (1.524 m)  Wt 156 lb (70.761 kg)  BMI 30.47 kg/m2 General: Patient alert oriented but appears uncomfortable. No acute distress. Breasts: Left breast is somewhat larger than the right with some very faint erythema in the central portion. There is some edema of the nipple areolar complex and she is exquisitely tender in the area of the old scar at the areola margin upper outer quadrant. However the area soft and it does not appear fluctuant and I do not believe there is a drainable abscess at this time.  DATA REVIEWED:  I reviewed the prior office notes    Serrina Minogue J 11/16/2011  CC: Sydnee Levans, MD

## 2011-11-16 NOTE — Patient Instructions (Signed)
Use warm compresses on the left breast 4 times a day Start antibiotics as soon as possible We need to get an ultrasound tomorrow to see if there is an abscess that'll require surgical drainage or aspiration.  Come back next week for a recheck to see how this is doing.

## 2011-11-17 ENCOUNTER — Telehealth (INDEPENDENT_AMBULATORY_CARE_PROVIDER_SITE_OTHER): Payer: Self-pay | Admitting: General Surgery

## 2011-11-17 ENCOUNTER — Ambulatory Visit
Admission: RE | Admit: 2011-11-17 | Discharge: 2011-11-17 | Disposition: A | Discharge status: Home / Self Care | Payer: Medicaid Other | Source: Ambulatory Visit | Attending: Surgery | Admitting: Surgery

## 2011-11-17 DIAGNOSIS — N611 Abscess of the breast and nipple: Secondary | ICD-10-CM

## 2011-11-17 NOTE — Telephone Encounter (Signed)
Called patient and made her aware of left breast ultrasound scheduled at [facility] today at 3:30pm. She will try to keep appt. Number given for [facility] if patient can not make it and needs to reschedule.

## 2011-11-27 ENCOUNTER — Encounter: Payer: Self-pay | Admitting: Family Medicine

## 2011-11-27 ENCOUNTER — Ambulatory Visit (INDEPENDENT_AMBULATORY_CARE_PROVIDER_SITE_OTHER): Payer: Medicaid Other | Admitting: Family Medicine

## 2011-11-27 VITALS — BP 138/88 | HR 89 | Temp 98.4°F | Ht 60.0 in | Wt 162.0 lb

## 2011-11-27 DIAGNOSIS — N61 Mastitis without abscess: Secondary | ICD-10-CM | POA: Insufficient documentation

## 2011-11-27 DIAGNOSIS — O99345 Other mental disorders complicating the puerperium: Secondary | ICD-10-CM

## 2011-11-27 DIAGNOSIS — F53 Postpartum depression: Secondary | ICD-10-CM

## 2011-11-27 DIAGNOSIS — N611 Abscess of the breast and nipple: Secondary | ICD-10-CM

## 2011-11-27 DIAGNOSIS — F329 Major depressive disorder, single episode, unspecified: Secondary | ICD-10-CM

## 2011-11-27 MED ORDER — DOXYCYCLINE HYCLATE 100 MG PO TABS: 100.0000 mg | ORAL_TABLET | Freq: Every day | ORAL | Status: DC

## 2011-11-27 NOTE — Progress Notes (Signed)
  Subjective:    Patient ID: [name] [name], female    DOB: 06-11-1985, 26 y.o.   MRN: [id number]  HPI  Post Partum Depression Becoming more moody and snappy since off her celexa.  She had significant dizziness when stopped it but now has resolved.  No suicidal ideation.  Not sure if she want to start another medicaiton.  No fhx of depression  Recurrent Mastitis Has had several episodes treated at [facility] since developed mastitis when pregnant.  Currently finishing course of antibiotics.  Has improved significantly.  It is always her left breast.  She has tried antibacterial soaps but never antibiotics prophylaxis.  She wonders what else she can do.  Asthma Reports good control with only qod albuterol use  Review of Symptoms - see HPI  PMH - Smoking status noted.     Review of Systems     Objective:   Physical Exam  Alert no acute distress       Assessment & Plan:

## 2011-11-27 NOTE — Assessment & Plan Note (Signed)
Recurring after she weaned herself off of celexa because it did not seem to be working.  She is considering trial of another antidepressant

## 2011-11-27 NOTE — Assessment & Plan Note (Signed)
Will put on once daily doxy for one month.  Investigate if any other therapies

## 2011-11-27 NOTE — Patient Instructions (Addendum)
I will check to see if any referrals for recurrent mastitis  Take the doxy 1 tab every day with food for 30 days  If you want to try another mood medication then call me  Make an appointment for your Pap smear

## 2011-12-07 ENCOUNTER — Telehealth: Payer: Self-pay | Admitting: Family Medicine

## 2011-12-07 MED ORDER — BUPROPION HCL ER (XL) 150 MG PO TB24: 150.0000 mg | ORAL_TABLET | Freq: Every day | ORAL | Status: DC

## 2011-12-07 NOTE — Telephone Encounter (Signed)
Patient is calling to let Dr. Deirdre Priest know that the Celexa isn't working and she would like to try Wellbutrin.

## 2011-12-07 NOTE — Telephone Encounter (Signed)
Please let her know i sent rx thanks

## 2011-12-08 NOTE — Telephone Encounter (Signed)
LMOVM informing pt that "Dr. Deirdre Priest sent in that Rx that she requested". Fredna Stricker, [name]

## 2011-12-12 ENCOUNTER — Other Ambulatory Visit: Payer: Self-pay | Admitting: Family Medicine

## 2011-12-20 ENCOUNTER — Ambulatory Visit (INDEPENDENT_AMBULATORY_CARE_PROVIDER_SITE_OTHER): Payer: Medicaid Other | Admitting: Family Medicine

## 2011-12-20 ENCOUNTER — Encounter: Payer: Self-pay | Admitting: Family Medicine

## 2011-12-20 VITALS — BP 126/77 | HR 99 | Temp 98.4°F | Ht 60.0 in | Wt 159.1 lb

## 2011-12-20 DIAGNOSIS — F329 Major depressive disorder, single episode, unspecified: Secondary | ICD-10-CM

## 2011-12-20 DIAGNOSIS — H53143 Visual discomfort, bilateral: Secondary | ICD-10-CM | POA: Insufficient documentation

## 2011-12-20 DIAGNOSIS — H53149 Visual discomfort, unspecified: Secondary | ICD-10-CM

## 2011-12-20 DIAGNOSIS — O99345 Other mental disorders complicating the puerperium: Secondary | ICD-10-CM

## 2011-12-20 NOTE — Assessment & Plan Note (Signed)
Improved with Wellbutrin.

## 2011-12-20 NOTE — Patient Instructions (Addendum)
See the eye doctor today  Stop the doxycyline until he examines you  Come back for a Pap after the holidays

## 2011-12-20 NOTE — Assessment & Plan Note (Signed)
Uncertain cause - could possibly be new medications but need rule out iritis or deeper eye inflammation Will refer to ophthalmology today Stop doxycycline for now

## 2011-12-20 NOTE — Progress Notes (Signed)
  Subjective:    Patient ID: [name] [name], female    DOB: [birth year], 26 y.o.   MRN: [id number]  HPI  Photophobia For last 2 weeks has been very sensitive to lights in both eyes.  Had to stop driving and stay in dark.  Her eyes water frequently but no discharge.  No change in acuity.  No trauma or foreign body or contacts or exposures.  Did start wellbutrin about a week before and has been on daily doxycycline for mastitis prophylaxis.   No rashes or joint swelling or fevers  PMH - no history of collagen vascular disease  Review of Systems     Objective:   Physical Exam Sitting in dark room Visual acuity noted  can barely keep her eyes open with light for exam No discharge but many tears R eye has mild diffuse conjunctival injection perhaps more central than peripherall Eye - Pupils Equal Round Reactive to light, Extraocular movements intact, Fundi without hemorrhage or visible lesions,  Anterior chamber appeers clear.  No pupil irregularity Pain and photophobia is not changed significantly by adding tetracaine topical anesthetic  Skin - clear except for dry areas on her forearms No joint swelling        Assessment & Plan:

## 2011-12-21 ENCOUNTER — Telehealth: Payer: Self-pay | Admitting: Family Medicine

## 2011-12-21 NOTE — Telephone Encounter (Signed)
Went to eye doctor [facility]) yesterday and was told she was having an allergic reaction and did nothing.  Wants to know what to do now.

## 2011-12-21 NOTE — Telephone Encounter (Signed)
Saw ophtalmology - did exam said was an allergy - gave her an ointment  Do change so far  Told to stop the doxy and see how does over the next 1-2 weeks  Call if any worsening or any vision change

## 2011-12-29 ENCOUNTER — Emergency Department (HOSPITAL_COMMUNITY): Payer: Medicaid Other

## 2011-12-29 ENCOUNTER — Encounter (HOSPITAL_COMMUNITY): Payer: Self-pay | Admitting: Emergency Medicine

## 2011-12-29 ENCOUNTER — Emergency Department (HOSPITAL_COMMUNITY)
Admission: EM | Admit: 2011-12-29 | Discharge: 2011-12-29 | Disposition: A | Discharge status: Home / Self Care | Payer: Medicaid Other | Attending: Emergency Medicine | Admitting: Emergency Medicine

## 2011-12-29 DIAGNOSIS — R059 Cough, unspecified: Secondary | ICD-10-CM | POA: Insufficient documentation

## 2011-12-29 DIAGNOSIS — J45901 Unspecified asthma with (acute) exacerbation: Secondary | ICD-10-CM | POA: Insufficient documentation

## 2011-12-29 DIAGNOSIS — Z79899 Other long term (current) drug therapy: Secondary | ICD-10-CM | POA: Insufficient documentation

## 2011-12-29 DIAGNOSIS — Z87891 Personal history of nicotine dependence: Secondary | ICD-10-CM | POA: Insufficient documentation

## 2011-12-29 DIAGNOSIS — J45909 Unspecified asthma, uncomplicated: Secondary | ICD-10-CM

## 2011-12-29 DIAGNOSIS — R079 Chest pain, unspecified: Secondary | ICD-10-CM | POA: Insufficient documentation

## 2011-12-29 DIAGNOSIS — R0602 Shortness of breath: Secondary | ICD-10-CM | POA: Insufficient documentation

## 2011-12-29 DIAGNOSIS — Z8719 Personal history of other diseases of the digestive system: Secondary | ICD-10-CM | POA: Insufficient documentation

## 2011-12-29 DIAGNOSIS — R05 Cough: Secondary | ICD-10-CM | POA: Insufficient documentation

## 2011-12-29 DIAGNOSIS — Z8619 Personal history of other infectious and parasitic diseases: Secondary | ICD-10-CM | POA: Insufficient documentation

## 2011-12-29 MED ORDER — IPRATROPIUM BROMIDE 0.02 % IN SOLN
0.5000 mg | Freq: Once | RESPIRATORY_TRACT | Status: AC
  Administered 2011-12-29: 0.5 mg via RESPIRATORY_TRACT
  Filled 2011-12-29: qty 2.5

## 2011-12-29 MED ORDER — ALBUTEROL SULFATE (5 MG/ML) 0.5% IN NEBU
5.0000 mg | INHALATION_SOLUTION | Freq: Once | RESPIRATORY_TRACT | Status: AC
  Administered 2011-12-29: 5 mg via RESPIRATORY_TRACT
  Filled 2011-12-29: qty 1

## 2011-12-29 MED ORDER — PREDNISONE 20 MG PO TABS
60.0000 mg | ORAL_TABLET | Freq: Once | ORAL | Status: AC
  Administered 2011-12-29: 60 mg via ORAL
  Filled 2011-12-29: qty 3

## 2011-12-29 MED ORDER — PREDNISONE 20 MG PO TABS: 60.0000 mg | ORAL_TABLET | Freq: Every day | ORAL | Status: DC

## 2011-12-29 MED ORDER — ALBUTEROL SULFATE (2.5 MG/3ML) 0.083% IN NEBU: 2.5000 mg | INHALATION_SOLUTION | Freq: Four times a day (QID) | RESPIRATORY_TRACT | Status: DC | PRN

## 2011-12-29 MED ORDER — ALBUTEROL SULFATE HFA 108 (90 BASE) MCG/ACT IN AERS
2.0000 | INHALATION_SPRAY | RESPIRATORY_TRACT | Status: DC | PRN
  Administered 2011-12-29: 2 via RESPIRATORY_TRACT
  Filled 2011-12-29: qty 6.7

## 2011-12-29 NOTE — ED Notes (Signed)
Pt returned from Xray, placed back on monitor.

## 2011-12-29 NOTE — ED Provider Notes (Signed)
History     CSN: [id number]  Arrival date & time 12/29/11  1058   First MD Initiated Contact with Patient 12/29/11 1152      Chief Complaint  Patient presents with  . Asthma    (Consider location/radiation/quality/duration/timing/severity/associated sxs/prior treatment) Patient is a 26 y.o. female presenting with asthma. The history is provided by the patient.  Asthma This is a recurrent problem. The current episode started in the past 7 days. Associated symptoms include chest pain and coughing. Pertinent negatives include no abdominal pain, chills, fever, myalgias, rash or sore throat. Associated symptoms comments: Patient with a history of asthma presents with increased wheezing, cough and shortness of breath that seems recurrently worse this same time every year. No fever. She is currently out of her inhaler..    Past Medical History  Diagnosis Date  . Asthma   . Pregnant state, incidental   . Breast abscess   . GERD (gastroesophageal reflux disease)     Past Surgical History  Procedure Date  . No past surgeries   . Incise and drain abcess     left breast    Family History  Problem Relation Age of Onset  . Healthy Mother   . Healthy Father   . Stroke Maternal Grandmother   . Stroke Paternal Grandfather     History  Substance Use Topics  . Smoking status: Former Smoker    Quit date: 06/22/2010  . Smokeless tobacco: Never Used  . Alcohol Use: No    OB History    Grav Para Term Preterm Abortions TAB SAB Ect Mult Living   1 1  1      1       Review of Systems  Constitutional: Negative for fever and chills.  HENT: Negative.  Negative for sore throat and rhinorrhea.   Respiratory: Positive for cough, shortness of breath and wheezing.   Cardiovascular: Positive for chest pain.  Gastrointestinal: Negative.  Negative for abdominal pain.  Musculoskeletal: Negative.  Negative for myalgias.  Skin: Negative.  Negative for rash.  Neurological: Negative.      Allergies  Doxycycline  Home Medications   Current Outpatient Rx  Name  Route  Sig  Dispense  Refill  . BUDESONIDE 180 MCG/ACT IN AEPB   Inhalation   Inhale 1 puff into the lungs 2 (two) times daily.         . BUPROPION HCL ER (XL) 150 MG PO TB24   Oral   Take 1 tablet (150 mg total) by mouth daily.   30 tablet   3   . PROAIR HFA 108 (90 BASE) MCG/ACT IN AERS      USE 2 PUFFS BY MOUTH EVERY 6 HOURS AS NEEDED FOR WHEEZING   8.5 each   6   . TRIAMCINOLONE ACETONIDE 0.1 % EX OINT   Topical   Apply topically 2 (two) times daily.   90 g   2     BP 102/54  Pulse 119  Temp 99.2 F (37.3 C) (Oral)  Resp 21  SpO2 98%  LMP 11/30/2011  Breastfeeding? No  Physical Exam  Constitutional: She appears well-developed and well-nourished.  HENT:  Head: Normocephalic.  Neck: Normal range of motion. Neck supple.  Cardiovascular: Normal rate and regular rhythm.   Pulmonary/Chest: Effort normal and breath sounds normal. She has no wheezes. She has no rales.       Inspiratory and expiratory wheezing, greater after first nebulizer treatment. Symptoms improved with subsequent treatment.  Abdominal: Soft.  Bowel sounds are normal. There is no tenderness. There is no rebound and no guarding.  Musculoskeletal: Normal range of motion.  Neurological: She is alert. No cranial nerve deficit.  Skin: Skin is warm and dry. No rash noted.  Psychiatric: She has a normal mood and affect.    ED Course  Procedures (including critical care time)  Labs Reviewed - No data to display Dg Chest 2 View  12/29/2011  *RADIOLOGY REPORT*  Clinical Data: Asthma.  CHEST - 2 VIEW  Comparison: 06/14/2011  Findings: Heart and mediastinal contours are within normal limits. No focal opacities or effusions.  No acute bony abnormality.  IMPRESSION: No active cardiopulmonary disease.   Original Report Authenticated By: [name], [name]      No diagnosis found.  1. Asthma   MDM  Multiple nebulizers  with improvement but not resolution of wheezing. Ambulatory with no decompensation, O2 sats stable, patient does not experience SOB. Stable for discharge.        Arnoldo Hooker, PA-C 01/01/12 1214

## 2011-12-29 NOTE — ED Notes (Signed)
Melvenia Beam, PA notified about pts condition after second nebulizer treatment.

## 2011-12-29 NOTE — ED Notes (Signed)
Pt states that she is dizzy which is not normal and has been going on for a few days. Pt states she is out of asthma medication. Pt mentating appropriately.

## 2011-12-29 NOTE — ED Notes (Signed)
Pt speaking in clear sentences and does not appear to be in acute distress upon assessment.

## 2011-12-29 NOTE — ED Notes (Addendum)
Pt c/o increased SOB from asthma with cough x 3 days; pt sts out of meds; pt sts some dizziness today

## 2011-12-29 NOTE — ED Notes (Signed)
Pt states she still feels dizzy; pt mentating appropriately and speaking in clear sentences; pt continues to cough and has nasal congestion; pt states mild pain from coughing.

## 2011-12-29 NOTE — ED Notes (Signed)
Pt states "I still feel like crap." Pt mentating appropriately. Pt denies pain currently. Pt speaking in clear sentences.

## 2011-12-29 NOTE — ED Notes (Signed)
Pt states she still feels bad and still feels dizzy; pt continues to have cough and appears to have nasal congestion; pt does not appear to be in acute distress and is speaking in clear sentences. Pt menating appropriately.

## 2011-12-29 NOTE — ED Notes (Signed)
Pt ambulatory leaving ED by self; pt given d/c teaching and prescription; pt verbalized understanding of d/c teaching and prescriptions; pt has no further questions upon d/c. Pt does not appear to be in acute distress upon d/c.

## 2011-12-29 NOTE — ED Notes (Signed)
Pts ticket to ride placed in room.

## 2011-12-30 ENCOUNTER — Inpatient Hospital Stay (HOSPITAL_COMMUNITY)
Admission: EM | Admit: 2011-12-30 | Discharge: 2011-12-31 | DRG: 203 | Disposition: A | Discharge status: Home / Self Care | Payer: Medicaid Other | Attending: Family Medicine | Admitting: Family Medicine

## 2011-12-30 ENCOUNTER — Encounter (HOSPITAL_COMMUNITY): Payer: Self-pay | Admitting: *Deleted

## 2011-12-30 DIAGNOSIS — J45901 Unspecified asthma with (acute) exacerbation: Principal | ICD-10-CM | POA: Diagnosis present

## 2011-12-30 DIAGNOSIS — F329 Major depressive disorder, single episode, unspecified: Secondary | ICD-10-CM | POA: Diagnosis present

## 2011-12-30 DIAGNOSIS — F3289 Other specified depressive episodes: Secondary | ICD-10-CM | POA: Diagnosis present

## 2011-12-30 DIAGNOSIS — Z87891 Personal history of nicotine dependence: Secondary | ICD-10-CM

## 2011-12-30 DIAGNOSIS — K219 Gastro-esophageal reflux disease without esophagitis: Secondary | ICD-10-CM | POA: Diagnosis present

## 2011-12-30 DIAGNOSIS — J45909 Unspecified asthma, uncomplicated: Secondary | ICD-10-CM

## 2011-12-30 LAB — CBC WITH DIFFERENTIAL/PLATELET
Basophils Absolute: 0 10*3/uL (ref 0.0–0.1)
Lymphocytes Relative: 13 % (ref 12–46)
Neutro Abs: 4.7 10*3/uL (ref 1.7–7.7)
Platelets: 298 10*3/uL (ref 150–400)
RBC: 4.51 MIL/uL (ref 3.87–5.11)
RDW: 13.7 % (ref 11.5–15.5)
WBC: 6.1 10*3/uL (ref 4.0–10.5)

## 2011-12-30 LAB — POCT I-STAT, CHEM 8
BUN: 5 mg/dL — ABNORMAL LOW (ref 6–23)
Chloride: 104 mEq/L (ref 96–112)
Potassium: 3.6 mEq/L (ref 3.5–5.1)
Sodium: 140 mEq/L (ref 135–145)
TCO2: 25 mmol/L (ref 0–100)

## 2011-12-30 MED ORDER — GUAIFENESIN ER 600 MG PO TB12
600.0000 mg | ORAL_TABLET | Freq: Two times a day (BID) | ORAL | Status: DC
  Administered 2011-12-30 – 2011-12-31 (×2): 600 mg via ORAL
  Filled 2011-12-30 (×3): qty 1

## 2011-12-30 MED ORDER — DEXTROMETHORPHAN POLISTIREX 30 MG/5ML PO LQCR
10.0000 mL | Freq: Two times a day (BID) | ORAL | Status: DC | PRN
  Administered 2011-12-30: 60 mg via ORAL
  Filled 2011-12-30 (×2): qty 10

## 2011-12-30 MED ORDER — FLUTICASONE PROPIONATE HFA 44 MCG/ACT IN AERO
2.0000 | INHALATION_SPRAY | Freq: Two times a day (BID) | RESPIRATORY_TRACT | Status: DC
  Administered 2011-12-31: 2 via RESPIRATORY_TRACT

## 2011-12-30 MED ORDER — SODIUM CHLORIDE 0.9 % IV BOLUS (SEPSIS)
500.0000 mL | Freq: Once | INTRAVENOUS | Status: AC
  Administered 2011-12-30: 500 mL via INTRAVENOUS

## 2011-12-30 MED ORDER — ALBUTEROL SULFATE (5 MG/ML) 0.5% IN NEBU
2.5000 mg | INHALATION_SOLUTION | RESPIRATORY_TRACT | Status: DC
  Administered 2011-12-30 – 2011-12-31 (×5): 2.5 mg via RESPIRATORY_TRACT
  Filled 2011-12-30 (×3): qty 0.5
  Filled 2011-12-30: qty 1
  Filled 2011-12-30: qty 0.5

## 2011-12-30 MED ORDER — PREDNISONE 50 MG PO TABS
60.0000 mg | ORAL_TABLET | Freq: Every day | ORAL | Status: DC
  Administered 2011-12-31: 60 mg via ORAL
  Filled 2011-12-30: qty 1

## 2011-12-30 MED ORDER — ALBUTEROL SULFATE (5 MG/ML) 0.5% IN NEBU
2.5000 mg | INHALATION_SOLUTION | RESPIRATORY_TRACT | Status: DC | PRN
  Administered 2011-12-30: 2.5 mg via RESPIRATORY_TRACT
  Filled 2011-12-30 (×2): qty 0.5

## 2011-12-30 MED ORDER — FLUTICASONE PROPIONATE HFA 44 MCG/ACT IN AERO
2.0000 | INHALATION_SPRAY | Freq: Two times a day (BID) | RESPIRATORY_TRACT | Status: DC
  Filled 2011-12-30: qty 10.6

## 2011-12-30 MED ORDER — BUPROPION HCL ER (XL) 150 MG PO TB24
150.0000 mg | ORAL_TABLET | Freq: Every day | ORAL | Status: DC
  Administered 2011-12-30 – 2011-12-31 (×2): 150 mg via ORAL
  Filled 2011-12-30 (×2): qty 1

## 2011-12-30 MED ORDER — ENOXAPARIN SODIUM 40 MG/0.4ML ~~LOC~~ SOLN
40.0000 mg | SUBCUTANEOUS | Status: DC
  Administered 2011-12-30: 40 mg via SUBCUTANEOUS
  Filled 2011-12-30 (×2): qty 0.4

## 2011-12-30 NOTE — ED Notes (Signed)
Pt was here yesterday with similar complaint; pt states she has been doing round the clock nebulizer treatments and has not felt any better; pt coughing; pt states she is hot and cold; pt sounds congested. Pt mentating appropriately.

## 2011-12-30 NOTE — ED Notes (Signed)
Pt reports having asthma, was here yesterday for same. Doing breathing tx at home with no relief. spo2 96% on room air, speaking in full setences. Having cough and dizziness. Reports negative chest xray yesterday. Airway intact.

## 2011-12-30 NOTE — ED Provider Notes (Signed)
History     CSN: [id number]  Arrival date & time 12/30/11  1605   First MD Initiated Contact with Patient 12/30/11 1632      Chief Complaint  Patient presents with  . Asthma    (Consider location/radiation/quality/duration/timing/severity/associated sxs/prior treatment) Patient is a 26 y.o. female presenting with asthma. The history is provided by the patient.  Asthma This is a recurrent problem. Associated symptoms include shortness of breath. Pertinent negatives include no chest pain, no abdominal pain and no headaches.   patient with shortness of breath and cough for the last few days. She seen in ER yesterday for the same. She's given steroids. She's been doing breathing treatments at home and orally. She has had nasal drainage. She has had some lightheadedness. X-ray was negative for pneumonia yesterday. She has sick contacts at home. Her baseline is she uses her rescue inhaler once a day. Last admission for asthma was 5 years ago.  Past Medical History  Diagnosis Date  . Asthma   . Pregnant state, incidental   . Breast abscess   . GERD (gastroesophageal reflux disease)     Past Surgical History  Procedure Date  . No past surgeries   . Incise and drain abcess     left breast    Family History  Problem Relation Age of Onset  . Healthy Mother   . Healthy Father   . Stroke Maternal Grandmother   . Stroke Paternal Grandfather     History  Substance Use Topics  . Smoking status: Former Smoker    Quit date: 06/22/2010  . Smokeless tobacco: Never Used  . Alcohol Use: No    OB History    Grav Para Term Preterm Abortions TAB SAB Ect Mult Living   1 1  1      1       Review of Systems  Constitutional: Negative for activity change and appetite change.  HENT: Positive for congestion. Negative for trouble swallowing, neck stiffness and voice change.   Eyes: Negative for pain.  Respiratory: Positive for cough, shortness of breath and wheezing. Negative for chest  tightness.   Cardiovascular: Negative for chest pain and leg swelling.  Gastrointestinal: Positive for nausea. Negative for vomiting, abdominal pain and diarrhea.  Genitourinary: Negative for flank pain.  Musculoskeletal: Positive for myalgias. Negative for back pain.  Skin: Negative for rash.  Neurological: Negative for weakness, numbness and headaches.  Psychiatric/Behavioral: Negative for behavioral problems.    Allergies  Doxycycline  Home Medications   Current Outpatient Rx  Name  Route  Sig  Dispense  Refill  . ALBUTEROL SULFATE HFA 108 (90 BASE) MCG/ACT IN AERS   Inhalation   Inhale 2 puffs into the lungs every 6 (six) hours as needed. For wheezing         . ALBUTEROL SULFATE (2.5 MG/3ML) 0.083% IN NEBU   Nebulization   Take 3 mLs (2.5 mg total) by nebulization every 6 (six) hours as needed for wheezing.   75 mL   12   . BUDESONIDE 180 MCG/ACT IN AEPB   Inhalation   Inhale 1 puff into the lungs 2 (two) times daily.         . BUPROPION HCL ER (XL) 150 MG PO TB24   Oral   Take 1 tablet (150 mg total) by mouth daily.   30 tablet   3   . PREDNISONE 20 MG PO TABS   Oral   Take 3 tablets (60 mg total) by mouth  daily.   12 tablet   0   . TRIAMCINOLONE ACETONIDE 0.1 % EX OINT   Topical   Apply topically 2 (two) times daily.   90 g   2     BP 135/60  Pulse 121  Temp 100.1 F (37.8 C) (Oral)  Resp 26  SpO2 96%  LMP 11/30/2011  Physical Exam  Nursing note and vitals reviewed. Constitutional: She is oriented to person, place, and time. She appears well-developed and well-nourished.  HENT:  Head: Normocephalic and atraumatic.  Eyes: EOM are normal. Pupils are equal, round, and reactive to light.  Neck: Normal range of motion. Neck supple.  Cardiovascular:  No murmur heard.      Tachycardia  Pulmonary/Chest: She is in respiratory distress. She has wheezes. She has no rales.       Mild respiratory distress. Diffuse wheezes and prolonged expirations.  Mild tachypnea  Abdominal: Soft. Bowel sounds are normal. She exhibits no distension. There is no tenderness. There is no rebound and no guarding.  Musculoskeletal: Normal range of motion.  Neurological: She is alert and oriented to person, place, and time. No cranial nerve deficit.  Skin: Skin is warm and dry.  Psychiatric: She has a normal mood and affect. Her speech is normal.    ED Course  Procedures (including critical care time)   Labs Reviewed  CBC WITH DIFFERENTIAL   Dg Chest 2 View  12/29/2011  *RADIOLOGY REPORT*  Clinical Data: Asthma.  CHEST - 2 VIEW  Comparison: 06/14/2011  Findings: Heart and mediastinal contours are within normal limits. No focal opacities or effusions.  No acute bony abnormality.  IMPRESSION: No active cardiopulmonary disease.   Original Report Authenticated By: [name], [name]      No diagnosis found.    MDM  Patient with shortness of breath and cough. History of asthma. Low-grade temperature. Seen yesterday for the same. Negative x-ray at that time. Patient's resting heart rate here was 120. With ambulation it went to 140. She is oriented steroids started doing breathing treatments at home. Family practice will be consulted for possible admission.        Juliet Rude. Rubin Payor, MD 12/31/11 [phone]

## 2011-12-30 NOTE — H&P (Signed)
[name] [name] is an 26 y.o. female.   Assessment/Plan 26 yo female with history of asthma here with increased shortness of breath and wheezing likely from asthma exacerbation  Pulm #Asthma exacerbation:  History of persistent asthma since childhood.  She was seen in ED yesterday and given albuterol neb and steroids.  She has been doing albuterol nebs q4 hours for the past 24 hours without much improvement in her symptoms.  She still has diffuse wheezes with poor air movement upon examination today.  CXR from yesterday without infiltrate and normal WBC count, so unlikely PNA. Will admit for asthma exacerbation.  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  Currently stable on wellbutrin -Continue wellbutrin  FEN/GI:   -Regular diet -SLIV  PPx Lovenox  Dispo: Pending improvement in respiratory status  Chief Complaint: Cough, wheezing, shortness of breath  HPI: 26 yo female with history of persistent asthma since childhood here with complaint of cough, wheezing and shortness of breath.  States that symptoms began 3-4 days ago and feels like they have been worsening.  She was seen in the ED yesterday and given nebulizer treatment and prednisone.  She returns today because she has not had any improvement with q4 hours albuterol and prednisone.  She does endorse some chills but no fever at home.  She does have some chest discomfort from coughing.  Other family members have had similar symptoms at home, although not as severe.  She quit smoking prior to her last pregnancy.   Past Medical History  Diagnosis Date  . Asthma   . Pregnant state, incidental   . Breast abscess   . GERD (gastroesophageal reflux disease)     Past Surgical History  Procedure Date  . No past surgeries   .  Incise and drain abcess     left breast    Family History  Problem Relation Age of Onset  . Healthy Mother   . Healthy Father   . Stroke Maternal Grandmother   . Stroke Paternal Grandfather    Social History:  reports that she quit smoking about 18 months ago. She has never used smokeless tobacco. She reports that she does not drink alcohol or use illicit drugs.  Allergies:  Allergies  Allergen Reactions  . Doxycycline     Photo sensitivity     (Not in a hospital admission)  Results for orders placed during the hospital encounter of 12/30/11 (from the past 48 hour(s))  CBC WITH DIFFERENTIAL     Status: Abnormal   Collection Time   12/30/11  4:41 PM      Component Value Range Comment   WBC 6.1  4.0 - 10.5 K/uL    RBC 4.51  3.87 - 5.11 MIL/uL    Hemoglobin 13.9  12.0 - 15.0 g/dL    HCT 16.1  09.6 - 04.5 %    MCV 94.2  78.0 - 100.0 fL    MCH 30.8  26.0 - 34.0 pg    MCHC 32.7  30.0 - 36.0 g/dL    RDW 40.9  81.1 - 91.4 %    Platelets 298  150 - 400 K/uL    Neutrophils Relative 78 (*) 43 - 77 %    Neutro Abs 4.7  1.7 - 7.7 K/uL    Lymphocytes Relative 13  12 -  46 %    Lymphs Abs 0.8  0.7 - 4.0 K/uL    Monocytes Relative 9  3 - 12 %    Monocytes Absolute 0.6  0.1 - 1.0 K/uL    Eosinophils Relative 0  0 - 5 %    Eosinophils Absolute 0.0  0.0 - 0.7 K/uL    Basophils Relative 0  0 - 1 %    Basophils Absolute 0.0  0.0 - 0.1 K/uL   POCT I-STAT, CHEM 8     Status: Abnormal   Collection Time   12/30/11  4:58 PM      Component Value Range Comment   Sodium 140  135 - 145 mEq/L    Potassium 3.6  3.5 - 5.1 mEq/L    Chloride 104  96 - 112 mEq/L    BUN 5 (*) 6 - 23 mg/dL    Creatinine, Ser 4.09  0.50 - 1.10 mg/dL    Glucose, Bld 94  70 - 99 mg/dL    Calcium, Ion 8.11 (*) 1.12 - 1.23 mmol/L    TCO2 25  0 - 100 mmol/L    Hemoglobin 15.3 (*) 12.0 - 15.0 g/dL    HCT 91.4  78.2 - 95.6 %    Dg Chest 2 View  12/29/2011  *RADIOLOGY REPORT*  Clinical Data: Asthma.  CHEST - 2 VIEW   Comparison: 06/14/2011  Findings: Heart and mediastinal contours are within normal limits. No focal opacities or effusions.  No acute bony abnormality.  IMPRESSION: No active cardiopulmonary disease.   Original Report Authenticated By: [name], [name]     ROS Per HPI, otherwise negative  Blood pressure 135/60, pulse 121, temperature 100.1 F (37.8 C), temperature source Oral, resp. rate 26, last menstrual period 11/30/2011, SpO2 96.00%. Physical Exam  Constitutional: She appears well-nourished. No distress.  HENT:  Head: Normocephalic and atraumatic.  Mouth/Throat: Oropharynx is clear and moist.  Eyes: Conjunctivae normal are normal. No scleral icterus.  Neck: Neck supple.  Cardiovascular: Regular rhythm and normal heart sounds.        Tachycardic   Respiratory: Effort normal.       Diffuse wheezes and scattered rhonchi.  Poor air movement throughout all lung fields   GI: Soft. There is no tenderness.  Musculoskeletal: She exhibits no edema and no tenderness.  Lymphadenopathy:    She has no cervical adenopathy.  Neurological: She is alert.       [name] 12/30/2011, 6:56 PM

## 2011-12-30 NOTE — ED Notes (Signed)
Dr. Rubin Payor notified about pts request for cough medicine

## 2011-12-30 NOTE — ED Notes (Signed)
Walked around unit with pulse ox on; pt started at 96% and increased to 98% but averaged about 97%; pt HR increased to 134-136 intermittently.

## 2011-12-31 ENCOUNTER — Encounter (HOSPITAL_COMMUNITY): Payer: Self-pay | Admitting: *Deleted

## 2011-12-31 MED ORDER — BENZONATATE 100 MG PO CAPS
200.0000 mg | ORAL_CAPSULE | Freq: Three times a day (TID) | ORAL | Status: DC | PRN
  Administered 2011-12-31: 200 mg via ORAL
  Filled 2011-12-31: qty 2

## 2011-12-31 MED ORDER — ALBUTEROL SULFATE HFA 108 (90 BASE) MCG/ACT IN AERS: 2.0000 | INHALATION_SPRAY | RESPIRATORY_TRACT | Status: DC | PRN

## 2011-12-31 MED ORDER — ALBUTEROL SULFATE HFA 108 (90 BASE) MCG/ACT IN AERS
4.0000 | INHALATION_SPRAY | RESPIRATORY_TRACT | Status: DC
  Administered 2011-12-31: 4 via RESPIRATORY_TRACT
  Filled 2011-12-31: qty 6.7

## 2011-12-31 MED ORDER — BENZONATATE 200 MG PO CAPS: 200.0000 mg | ORAL_CAPSULE | Freq: Three times a day (TID) | ORAL | Status: DC | PRN

## 2011-12-31 NOTE — H&P (Signed)
Family Medicine Teaching Service Attending Note  I interviewed and examined patient [name] [name] and reviewed their tests and x-rays.  I discussed with Dr. Ashley Royalty and reviewed their note for today.  I agree with their assessment and plan.     Additionally  Feeling some better Mild diffuse wheeze on exam Ok to discharge when can ambulate and do ADLs without significant wheezing  Will need MDI albuterol on discharge

## 2011-12-31 NOTE — Discharge Summary (Signed)
Family Medicine Teaching [facility] Discharge Summary  Patient name: [name] [name] Medical record number: [id number] Date of birth: 07-Jun-1985 Age: 26 y.o. Gender: female Date of Admission: 12/30/2011  Date of Discharge: 12/31/2011 Admitting Physician: Carney Living, MD  Primary Care Provider: Carney Living, MD  Indication for Hospitalization: Dyspnea Discharge Diagnoses:  1. Acute Asthma Exacerbation 2. Depression  Consultations: None  Significant Labs and Imaging:   CBC    Component Value Date/Time   WBC 6.1 12/30/2011 1641   RBC 4.51 12/30/2011 1641   HGB 15.3* 12/30/2011 1658   HCT 45.0 12/30/2011 1658   PLT 298 12/30/2011 1641   MCV 94.2 12/30/2011 1641   MCH 30.8 12/30/2011 1641   MCHC 32.7 12/30/2011 1641   RDW 13.7 12/30/2011 1641   LYMPHSABS 0.8 12/30/2011 1641   MONOABS 0.6 12/30/2011 1641   EOSABS 0.0 12/30/2011 1641   BASOSABS 0.0 12/30/2011 1641   BMET    Component Value Date/Time   NA 140 12/30/2011 1658   K 3.6 12/30/2011 1658   CL 104 12/30/2011 1658   GLUCOSE 94 12/30/2011 1658   BUN 5* 12/30/2011 1658   CREATININE 0.80 12/30/2011 1658    DG chest 2 view  IMPRESSION:  No active cardiopulmonary disease   Procedures: None  Brief Hospital Course:   26 yo female with history of asthma here with increased shortness of breath and wheezing likely from asthma exacerbation. She has a Hx of persistent asthma since childhood and was seen in the ED on 12/27  Where she was given albuterol and started on a prednisone coarse. She presented to  The ED with persistent dyspnea resistant to albuterol.   Asthma exacerbation:  She was evaluated on her first presentation with a CXR which did not show any infiltration and she did not have a significantly high WBC so pneumonia was felt to be unlikely. She was admitted to a med-surg floor, placed on continuous pulsox, started on scheduled albuterol nebulizers, home dose of Flovent continued,  and her previously started prednisone was continued. She did not have an oxygen requirement and she transitioned to MDI without a problem the day after admission. She was discharged home to continue scheduled albuterol for 1 more day, finish 2 more days of prednisone to complete a 5 day coarse, and to follow up with her PCP in 1 week.    Depression:  Her depression appears to be post-partum related and remained stable throughout her admission. She was continued on Wellbutrin at her previous dose.    Dispo: Home  Discharge Medications:    Medication List     As of 12/31/2011  4:09 PM    TAKE these medications         albuterol (2.5 MG/3ML) 0.083% nebulizer solution   Commonly known as: PROVENTIL   Take 3 mLs (2.5 mg total) by nebulization every 6 (six) hours as needed for wheezing.      albuterol 108 (90 BASE) MCG/ACT inhaler   Commonly known as: PROVENTIL HFA;VENTOLIN HFA   Inhale 2 puffs into the lungs every 2 (two) hours as needed for wheezing or shortness of breath.      albuterol 108 (90 BASE) MCG/ACT inhaler   Commonly known as: PROVENTIL HFA;VENTOLIN HFA   Inhale 2 puffs into the lungs every 6 (six) hours as needed. For wheezing      benzonatate 200 MG capsule   Commonly known as: TESSALON   Take 1 capsule (200 mg total) by mouth 3 (three)  times daily as needed for cough.      budesonide 180 MCG/ACT inhaler   Commonly known as: PULMICORT   Inhale 1 puff into the lungs 2 (two) times daily.      buPROPion 150 MG 24 hr tablet   Commonly known as: WELLBUTRIN XL   Take 1 tablet (150 mg total) by mouth daily.      predniSONE 20 MG tablet   Commonly known as: DELTASONE   Take 3 tablets (60 mg total) by mouth daily.      triamcinolone ointment 0.1 %   Commonly known as: KENALOG   Apply topically 2 (two) times daily.       Issues for Follow Up: Respiratory status, ensure that she has inhalers at home - was discharged with albuterol, flovent, and prescriptions.    Outstanding Results: None  Discharge Instructions: Please refer to Patient Instructions section of EMR for full details.  Patient was counseled important signs and symptoms that should prompt return to medical care, changes in medications, dietary instructions, activity restrictions, and follow up appointments.       Follow-up Information    Follow up with CHAMBLISS,MARSHALL L, MD. Call in 1 day. (for appt in 1-2 weeks)    Contact information:   [address] [place] [address] [phone]          Discharge Condition: [name], MD 12/31/2011, 4:09 PM

## 2011-12-31 NOTE — Progress Notes (Signed)
Family Medicine Teaching Service Daily Progress Note Service Page: [phone]  Patient Assessment: 26 yo female with history of asthma here with increased shortness of breath and wheezing likely from asthma exacerbation   Subjective:  Pt states that her breathing improves for a few minutes after each treatment but then gets worse quickly. Cough is not improved. States that she is having sharp chest pain with coughing. Wondering when the albuterol will start to help more.   Objective: Temp:  [99.1 F (37.3 C)-100.1 F (37.8 C)] 99.5 F (37.5 C) (12/29 0606) Pulse Rate:  [108-121] 110  (12/29 0606) Resp:  [17-26] 20  (12/29 0606) BP: (108-135)/(46-60) 114/56 mmHg (12/29 0606) SpO2:  [91 %-99 %] 93 % (12/29 0824) Weight:  [157 lb 6 oz (71.385 kg)] 157 lb 6 oz (71.385 kg) (12/29 0039)  Exam:  Gen: NAD, alert, cooperative with exam HEENT: NCAT, EOMI CV: RRR, good S1/S2, no murmur Resp: wheezing throughout, decreased air movement.  Abd: SNTND, BS present, no guarding or organomegaly Ext: No edema, warm Neuro: Alert and oriented, No gross deficits  I have reviewed the patient's medications, labs, imaging, and diagnostic testing.  Notable results are summarized below.  CBC BMET   Lab 12/30/11 1658 12/30/11 1641  WBC -- 6.1  HGB 15.3* 13.9  HCT 45.0 42.5  PLT -- 298    Lab 12/30/11 1658  NA 140  K 3.6  CL 104  CO2 --  BUN 5*  CREATININE 0.80  GLUCOSE 94  CALCIUM --     Imaging/Diagnostic Tests: DG chest 2 view IMPRESSION:  No active cardiopulmonary disease  Plan: 26 yo female with history of asthma here with increased shortness of breath and wheezing likely from asthma exacerbation. She has a Hx of persistent asthma since childhood, she was seen in the ED on 12/27 and given albuterol and started on a prednisone coarse.   Asthma exacerbation:  - Stable, unchanged from yesterday, still no O2 requirement - CXR from 12/27 without infiltrate and normal WBC count, so  unlikely PNA.   - Continuous pulse ox  - Peak flows  - O2 therapy PRN for saturations <92% - Continue prednisone 60mg  daily, day 3 today - Albuterol q4 hours scheduled/q2 hours prn, Will change to MDI - Continue home ICS   Depression:  - Appears to be post partum related.  - Currently stable on wellbutrin, continue   FEN/GI: Regular diet, SLIV  PPx: Lovenox  Dispo: Pending improvement in respiratory status, today vs tomorrow.    Kevin Fenton, MD 12/31/2011, 9:17 AM

## 2011-12-31 NOTE — Progress Notes (Signed)
Family Medicine Teaching Service Attending Note  I discussed patient Gracelyn Nurse  with Dr. Ermalinda Memos and reviewed their note for today.  I agree with their assessment and plan.

## 2012-01-01 ENCOUNTER — Other Ambulatory Visit: Payer: Self-pay | Admitting: Family Medicine

## 2012-01-01 NOTE — ED Provider Notes (Signed)
Medical screening examination/treatment/procedure(s) were performed by non-physician practitioner and as supervising physician I was immediately available for consultation/collaboration.   Gavin Pound. Nialah Saravia, MD 01/01/12 1539

## 2012-01-01 NOTE — Telephone Encounter (Signed)
Pt states she was to continue prednisone and told MD at hospital that she had some at home, but cannot find them. Will forward to Dr Deirdre Priest.

## 2012-01-01 NOTE — Discharge Summary (Signed)
I have reviewed this discharge summary and agree.    

## 2012-01-01 NOTE — Telephone Encounter (Signed)
Patient is calling after being discharged from the hospital yesterday.  She was discharged on Prednisone and she is now out and needs some more sent to [facility] on [address] and [address].

## 2012-01-02 MED ORDER — PREDNISONE 20 MG PO TABS: 60.0000 mg | ORAL_TABLET | Freq: Every day | ORAL | Status: AC

## 2012-01-02 NOTE — Telephone Encounter (Signed)
Sent in

## 2012-01-08 ENCOUNTER — Inpatient Hospital Stay: Payer: Medicaid Other | Admitting: Family Medicine

## 2012-01-29 ENCOUNTER — Telehealth: Payer: Self-pay | Admitting: Family Medicine

## 2012-01-29 DIAGNOSIS — N61 Mastitis without abscess: Secondary | ICD-10-CM

## 2012-01-29 NOTE — Telephone Encounter (Signed)
Patient is calling because she has a breast abscess that is acting up again and she would like the referral to the doctor at [facility] that was previously discussed.

## 2012-01-30 NOTE — Telephone Encounter (Signed)
Please let her know we can discuss this when she comes in for her asthma follow up appointment.  In the meantime I will look into possible referrals.  The breast center her in [place] would not be a good option Thanks  LC

## 2012-01-31 ENCOUNTER — Telehealth (INDEPENDENT_AMBULATORY_CARE_PROVIDER_SITE_OTHER): Payer: Self-pay

## 2012-01-31 MED ORDER — SULFAMETHOXAZOLE-TRIMETHOPRIM 800-160 MG PO TABS: 2.0000 | ORAL_TABLET | Freq: Two times a day (BID) | ORAL | Status: AC

## 2012-01-31 NOTE — Telephone Encounter (Signed)
Please call to see if she is breast feeding if so will need to use another antibiotics

## 2012-01-31 NOTE — Telephone Encounter (Signed)
Related message,pt voiced understanding. [name] [name] S  

## 2012-01-31 NOTE — Telephone Encounter (Signed)
Pt called stating she was given antibiotics by Dr Deirdre Priest for recurrent breast abscess but has not yet started them. Pt wants to know when she can be seen at our office. Pt offered appt for this afternoon or tomorrow. Pt states she cannot come when appt offered and  request appt for Monday. I reviewed Dr Deirdre Priest note in epic and it states pt has requested referral to [facility] to be evaluated for this problem. Pt states she will take antibiotic and may wait for appt with [facility] If not improving will call back for appt with our office.

## 2012-01-31 NOTE — Telephone Encounter (Signed)
Spoke with her.   No fever or chills but breast is tender like prior episodes.  Will give course of bactrim and asked her to follow up with surgery.  Call if unable or if has fever chills nausea or vomiting.  I put in a new ref for surgery  Asthma is doing well.  She is not on a controlled due to confusion about what medicaid will pay for.  Asked her to contact her pharmacy to send [place] a refill request for the last controlled medication that was covered   Asked her to come in for a follow up for asthma as soon as she can  Please notify her of the above

## 2012-01-31 NOTE — Telephone Encounter (Signed)
Spoke to pt,agrees to be seen. [name] [name], Virgel Bouquet

## 2012-01-31 NOTE — Telephone Encounter (Signed)
Dr Deirdre Priest, pt states she's not breast feeding. Pt would like you to know that she tried scheduling an appt with surgery,she was told all appt need ref even if she has been seen there in the past. Satsuki Zillmer, Virgel Bouquet

## 2012-02-01 ENCOUNTER — Encounter (INDEPENDENT_AMBULATORY_CARE_PROVIDER_SITE_OTHER): Payer: Self-pay | Admitting: Surgery

## 2012-02-01 ENCOUNTER — Ambulatory Visit (INDEPENDENT_AMBULATORY_CARE_PROVIDER_SITE_OTHER): Payer: Medicaid Other | Admitting: Surgery

## 2012-02-01 VITALS — BP 132/64 | HR 80 | Resp 16 | Ht 60.0 in | Wt 157.8 lb

## 2012-02-01 DIAGNOSIS — N611 Abscess of the breast and nipple: Secondary | ICD-10-CM

## 2012-02-01 DIAGNOSIS — N61 Mastitis without abscess: Secondary | ICD-10-CM

## 2012-02-01 NOTE — Progress Notes (Signed)
CC:  Chief Complaint  Patient presents with  . Abscess    left breast    HPI: Patient with history of left breast abscesses presents with another, developing over last few days. Received Rx for antibiotic, not started yet. No systemic sx  EXAM:  VS: BP 132/64  Pulse 80  Resp 16  Ht 5' (1.524 m)  Wt 157 lb 12.8 oz (71.578 kg)  BMI 30.82 kg/m2  Breastfeeding? No General: alert, NAD Breasts: Fluctuant area left areolar margin at 10:30, very tender, c/w small abscess  IMP: Recurrent breast abscess  PLAN: I&D with antibioticsto start. She is agreeable.  Procedure: The area is anesthetized with 1% xylo with Epi, waited 10 minutes for Epi effect. Sterile prep and I&D done, 2 cc purulent fluid drained, wound packed, small cavity.  patient will start antibiotic as already prescribed, and begin dressing changes tomorrow. She has had these packed before and thus familiar with the process.  RTC 10-14 days

## 2012-02-05 ENCOUNTER — Telehealth (INDEPENDENT_AMBULATORY_CARE_PROVIDER_SITE_OTHER): Payer: Self-pay | Admitting: General Surgery

## 2012-02-05 NOTE — Telephone Encounter (Signed)
Left message on machine making patient aware of two week appt date and time. Patient to call back if she can not make it.

## 2012-02-14 ENCOUNTER — Telehealth (INDEPENDENT_AMBULATORY_CARE_PROVIDER_SITE_OTHER): Payer: Self-pay | Admitting: *Deleted

## 2012-02-14 NOTE — Telephone Encounter (Signed)
Patient called to state that abscess is only getting worse since she was seen by Streck MD.  Patient states she is unable to come in to be seen due to work.  Suggested to patient that she been seen in an urgent care or ED but patient states she will just wait it out until her appt on Friday.

## 2012-02-16 ENCOUNTER — Encounter (HOSPITAL_COMMUNITY): Payer: Self-pay | Admitting: *Deleted

## 2012-02-16 ENCOUNTER — Encounter (INDEPENDENT_AMBULATORY_CARE_PROVIDER_SITE_OTHER): Payer: Self-pay | Admitting: Surgery

## 2012-02-16 ENCOUNTER — Ambulatory Visit (INDEPENDENT_AMBULATORY_CARE_PROVIDER_SITE_OTHER): Payer: Medicaid Other | Admitting: Surgery

## 2012-02-16 ENCOUNTER — Encounter (INDEPENDENT_AMBULATORY_CARE_PROVIDER_SITE_OTHER): Payer: Medicaid Other | Admitting: Surgery

## 2012-02-16 VITALS — BP 128/76 | HR 99 | Temp 99.0°F | Resp 18 | Ht 60.0 in | Wt 160.0 lb

## 2012-02-16 DIAGNOSIS — N611 Abscess of the breast and nipple: Secondary | ICD-10-CM

## 2012-02-16 DIAGNOSIS — N61 Mastitis without abscess: Secondary | ICD-10-CM

## 2012-02-16 MED ORDER — OXYCODONE-ACETAMINOPHEN 5-325 MG PO TABS: 1.0000 | ORAL_TABLET | ORAL | Status: DC | PRN

## 2012-02-16 MED ORDER — CEFAZOLIN SODIUM-DEXTROSE 2-3 GM-% IV SOLR
2.0000 g | INTRAVENOUS | Status: AC
  Administered 2012-02-17: 2 g via INTRAVENOUS
  Filled 2012-02-16: qty 50

## 2012-02-16 NOTE — Progress Notes (Addendum)
Subjective:     Patient ID: [name] [name], female   DOB: 07-25-1985, 27 y.o.   MRN: [id number]  HPIShe has not resolved the abscess that I drained two weeks ago and continues to have pain and drainage, despite antibiotic   Review of Systems    No change from last visits Objective:   Physical Exam  Vitals reviewed. Constitutional: She is oriented to person, place, and time. She appears well-developed and well-nourished. No distress.  HENT:  Head: Normocephalic and atraumatic.  Mouth/Throat: Oropharynx is clear and moist.  Eyes: Conjunctivae and EOM are normal. Pupils are equal, round, and reactive to light. No scleral icterus.  Neck: Normal range of motion. Neck supple. No tracheal deviation present. No thyromegaly present.  Cardiovascular: Normal rate, regular rhythm, normal heart sounds and intact distal pulses.  Exam reveals no gallop and no friction rub.   No murmur heard. Pulmonary/Chest: Effort normal and breath sounds normal. No respiratory distress. She has no wheezes. She has no rales. Right breast exhibits no inverted nipple, no mass, no nipple discharge, no skin change and no tenderness. Left breast exhibits tenderness.    Abdominal: Soft. Bowel sounds are normal. She exhibits no distension and no mass. There is no tenderness. There is no rebound and no guarding.  Musculoskeletal: Normal range of motion. She exhibits no edema and no tenderness.  Neurological: She is alert and oriented to person, place, and time.  Skin: Skin is warm and dry. No rash noted. She is not diaphoretic. No erythema.  Psychiatric: She has a normal mood and affect. Her behavior is normal. Judgment and thought content normal.  Vitals: BP 128/76  Pulse 99  Temp(Src) 99 F (37.2 C)  Resp 18  Ht 5' (1.524 m)  Wt 160 lb (72.576 kg)  BMI 31.25 kg/m2      Assessment:     Chronic breast abscess, recurrent with active infection     Plan:     I think this needs to be opened and debrided in the  OR and possible ductal excision   Discussed with her and she wants to proceed. Will add her to the OR schedule for tomorrow Patient seen/examined by Dr Magnus Ivan who is available tomorrow to do the surgery.  Gave he Rx for thirty percocet so she has pain meds for post op

## 2012-02-16 NOTE — Addendum Note (Signed)
Addended by: Currie Paris on: 02/16/2012 01:41 PM   Modules accepted: Orders

## 2012-02-17 ENCOUNTER — Ambulatory Visit (HOSPITAL_COMMUNITY)
Admission: RE | Admit: 2012-02-17 | Discharge: 2012-02-17 | Disposition: A | Discharge status: Home / Self Care | Payer: Medicaid Other | Source: Ambulatory Visit | Attending: Surgery | Admitting: Surgery

## 2012-02-17 ENCOUNTER — Ambulatory Visit (HOSPITAL_COMMUNITY): Payer: Medicaid Other | Admitting: Certified Registered"

## 2012-02-17 ENCOUNTER — Encounter (HOSPITAL_COMMUNITY): Payer: Self-pay | Admitting: Certified Registered"

## 2012-02-17 ENCOUNTER — Encounter (HOSPITAL_COMMUNITY)
Admission: RE | Disposition: A | Discharge status: Home / Self Care | Payer: Self-pay | Source: Ambulatory Visit | Attending: Surgery

## 2012-02-17 DIAGNOSIS — N61 Mastitis without abscess: Secondary | ICD-10-CM

## 2012-02-17 DIAGNOSIS — J45909 Unspecified asthma, uncomplicated: Secondary | ICD-10-CM | POA: Insufficient documentation

## 2012-02-17 HISTORY — DX: Depression, unspecified: F32.A

## 2012-02-17 HISTORY — DX: Family history of other specified conditions: Z84.89

## 2012-02-17 HISTORY — DX: Pneumonia, unspecified organism: J18.9

## 2012-02-17 HISTORY — DX: Major depressive disorder, single episode, unspecified: F32.9

## 2012-02-17 HISTORY — PX: INCISION AND DRAINAGE ABSCESS: SHX5864

## 2012-02-17 HISTORY — DX: Dermatitis, unspecified: L30.9

## 2012-02-17 LAB — CBC
Hemoglobin: 14.6 g/dL (ref 12.0–15.0)
MCH: 33.1 pg (ref 26.0–34.0)
MCHC: 34.9 g/dL (ref 30.0–36.0)
Platelets: 372 10*3/uL (ref 150–400)
RDW: 14.1 % (ref 11.5–15.5)

## 2012-02-17 LAB — SURGICAL PCR SCREEN
MRSA, PCR: NEGATIVE
Staphylococcus aureus: POSITIVE — AB

## 2012-02-17 LAB — HCG, SERUM, QUALITATIVE: Preg, Serum: NEGATIVE

## 2012-02-17 SURGERY — INCISION AND DRAINAGE, ABSCESS
Anesthesia: General | Site: Breast | Laterality: Left | Wound class: Dirty or Infected

## 2012-02-17 MED ORDER — 0.9 % SODIUM CHLORIDE (POUR BTL) OPTIME
TOPICAL | Status: DC | PRN
  Administered 2012-02-17: 1000 mL

## 2012-02-17 MED ORDER — LIDOCAINE HCL (CARDIAC) 20 MG/ML IV SOLN
INTRAVENOUS | Status: DC | PRN
  Administered 2012-02-17: 40 mg via INTRAVENOUS

## 2012-02-17 MED ORDER — ONDANSETRON HCL 4 MG/2ML IJ SOLN
INTRAMUSCULAR | Status: DC | PRN
  Administered 2012-02-17: 4 mg via INTRAVENOUS

## 2012-02-17 MED ORDER — SODIUM CHLORIDE 0.9 % IV SOLN: 250.0000 mL | INTRAVENOUS | Status: DC | PRN

## 2012-02-17 MED ORDER — LACTATED RINGERS IV SOLN
INTRAVENOUS | Status: DC | PRN
  Administered 2012-02-17: 08:00:00 via INTRAVENOUS

## 2012-02-17 MED ORDER — BUPIVACAINE-EPINEPHRINE (PF) 0.5% -1:200000 IJ SOLN
INTRAMUSCULAR | Status: AC
  Filled 2012-02-17: qty 10

## 2012-02-17 MED ORDER — ONDANSETRON HCL 4 MG/2ML IJ SOLN: 4.0000 mg | Freq: Four times a day (QID) | INTRAMUSCULAR | Status: DC | PRN

## 2012-02-17 MED ORDER — MORPHINE SULFATE 4 MG/ML IJ SOLN: 4.0000 mg | INTRAMUSCULAR | Status: DC | PRN

## 2012-02-17 MED ORDER — FENTANYL CITRATE 0.05 MG/ML IJ SOLN
INTRAMUSCULAR | Status: DC | PRN
  Administered 2012-02-17 (×2): 50 ug via INTRAVENOUS
  Administered 2012-02-17: 100 ug via INTRAVENOUS
  Administered 2012-02-17: 50 ug via INTRAVENOUS

## 2012-02-17 MED ORDER — KETOROLAC TROMETHAMINE 30 MG/ML IJ SOLN
INTRAMUSCULAR | Status: DC | PRN
  Administered 2012-02-17: 30 mg via INTRAVENOUS

## 2012-02-17 MED ORDER — OXYCODONE HCL 5 MG/5ML PO SOLN: 5.0000 mg | Freq: Once | ORAL | Status: DC | PRN

## 2012-02-17 MED ORDER — BUPIVACAINE-EPINEPHRINE 0.25% -1:200000 IJ SOLN
INTRAMUSCULAR | Status: AC
  Filled 2012-02-17: qty 1

## 2012-02-17 MED ORDER — MUPIROCIN 2 % EX OINT
TOPICAL_OINTMENT | Freq: Two times a day (BID) | CUTANEOUS | Status: DC
  Administered 2012-02-17: 07:00:00 via NASAL
  Filled 2012-02-17: qty 22

## 2012-02-17 MED ORDER — ACETAMINOPHEN 325 MG PO TABS: 650.0000 mg | ORAL_TABLET | ORAL | Status: DC | PRN

## 2012-02-17 MED ORDER — BUPIVACAINE-EPINEPHRINE PF 0.5-1:200000 % IJ SOLN
INTRAMUSCULAR | Status: DC | PRN
  Administered 2012-02-17: 16 mL

## 2012-02-17 MED ORDER — HYDROMORPHONE HCL PF 1 MG/ML IJ SOLN
INTRAMUSCULAR | Status: AC
  Filled 2012-02-17: qty 2

## 2012-02-17 MED ORDER — OXYCODONE HCL 5 MG PO TABS: 5.0000 mg | ORAL_TABLET | Freq: Once | ORAL | Status: DC | PRN

## 2012-02-17 MED ORDER — HYDROCODONE-ACETAMINOPHEN 7.5-500 MG PO TABS: 1.0000 | ORAL_TABLET | ORAL | Status: DC | PRN

## 2012-02-17 MED ORDER — MUPIROCIN 2 % EX OINT
TOPICAL_OINTMENT | CUTANEOUS | Status: AC
  Filled 2012-02-17: qty 22

## 2012-02-17 MED ORDER — OXYCODONE HCL 5 MG PO TABS: 5.0000 mg | ORAL_TABLET | ORAL | Status: DC | PRN

## 2012-02-17 MED ORDER — ACETAMINOPHEN 650 MG RE SUPP: 650.0000 mg | RECTAL | Status: DC | PRN

## 2012-02-17 MED ORDER — ONDANSETRON HCL 4 MG PO TABS: 4.0000 mg | ORAL_TABLET | Freq: Three times a day (TID) | ORAL | Status: DC | PRN

## 2012-02-17 MED ORDER — SODIUM CHLORIDE 0.9 % IJ SOLN: 3.0000 mL | Freq: Two times a day (BID) | INTRAMUSCULAR | Status: DC

## 2012-02-17 MED ORDER — HYDROMORPHONE HCL PF 1 MG/ML IJ SOLN
0.2500 mg | INTRAMUSCULAR | Status: DC | PRN
  Administered 2012-02-17 (×3): 0.5 mg via INTRAVENOUS

## 2012-02-17 MED ORDER — MIDAZOLAM HCL 5 MG/5ML IJ SOLN
INTRAMUSCULAR | Status: DC | PRN
  Administered 2012-02-17: 2 mg via INTRAVENOUS

## 2012-02-17 MED ORDER — PROPOFOL 10 MG/ML IV BOLUS
INTRAVENOUS | Status: DC | PRN
  Administered 2012-02-17: 150 mg via INTRAVENOUS

## 2012-02-17 MED ORDER — SODIUM CHLORIDE 0.9 % IJ SOLN: 3.0000 mL | INTRAMUSCULAR | Status: DC | PRN

## 2012-02-17 SURGICAL SUPPLY — 28 items
BANDAGE GAUZE ELAST BULKY 4 IN (GAUZE/BANDAGES/DRESSINGS) IMPLANT
CANISTER SUCTION 2500CC (MISCELLANEOUS) ×2 IMPLANT
CLOTH BEACON ORANGE TIMEOUT ST (SAFETY) ×2 IMPLANT
CONT SPEC STER OR (MISCELLANEOUS) ×1 IMPLANT
COVER SURGICAL LIGHT HANDLE (MISCELLANEOUS) ×2 IMPLANT
DRAPE LAPAROSCOPIC ABDOMINAL (DRAPES) ×2 IMPLANT
DRAPE UTILITY 15X26 W/TAPE STR (DRAPE) ×4 IMPLANT
DRSG PAD ABDOMINAL 8X10 ST (GAUZE/BANDAGES/DRESSINGS) IMPLANT
ELECT CAUTERY BLADE 6.4 (BLADE) ×2 IMPLANT
ELECT REM PT RETURN 9FT ADLT (ELECTROSURGICAL) ×2
ELECTRODE REM PT RTRN 9FT ADLT (ELECTROSURGICAL) ×1 IMPLANT
GAUZE PACKING FOLDED 2  STR (GAUZE/BANDAGES/DRESSINGS) ×1
GAUZE PACKING FOLDED 2 STR (GAUZE/BANDAGES/DRESSINGS) IMPLANT
GLOVE SURG SIGNA 7.5 PF LTX (GLOVE) ×2 IMPLANT
GOWN PREVENTION PLUS XLARGE (GOWN DISPOSABLE) ×2 IMPLANT
GOWN STRL NON-REIN LRG LVL3 (GOWN DISPOSABLE) ×2 IMPLANT
KIT BASIN OR (CUSTOM PROCEDURE TRAY) ×2 IMPLANT
KIT ROOM TURNOVER OR (KITS) ×2 IMPLANT
NS IRRIG 1000ML POUR BTL (IV SOLUTION) ×2 IMPLANT
PACK GENERAL/GYN (CUSTOM PROCEDURE TRAY) ×2 IMPLANT
PAD ARMBOARD 7.5X6 YLW CONV (MISCELLANEOUS) ×4 IMPLANT
SPECIMEN JAR SMALL (MISCELLANEOUS) IMPLANT
SPONGE GAUZE 4X4 12PLY (GAUZE/BANDAGES/DRESSINGS) ×1 IMPLANT
SWAB COLLECTION DEVICE MRSA (MISCELLANEOUS) IMPLANT
TAPE CLOTH SURG 4X10 WHT LF (GAUZE/BANDAGES/DRESSINGS) ×1 IMPLANT
TOWEL OR 17X24 6PK STRL BLUE (TOWEL DISPOSABLE) ×2 IMPLANT
TOWEL OR 17X26 10 PK STRL BLUE (TOWEL DISPOSABLE) ×2 IMPLANT
TUBE ANAEROBIC SPECIMEN COL (MISCELLANEOUS) ×1 IMPLANT

## 2012-02-17 NOTE — Interval H&P Note (Signed)
History and Physical Interval Note: no change in H and P  02/17/2012 7:00 AM  [name] [name]  has presented today for surgery, with the diagnosis of breast abscess LEFT  The various methods of treatment have been discussed with the patient and family. After consideration of risks, benefits and other options for treatment, the patient has consented to  Procedure(s):  DRAINAGEof recurrent breast abscess (Left) as a surgical intervention .  The patient's history has been reviewed, patient examined, no change in status, stable for surgery.  I have reviewed the patient's chart and labs.  Questions were answered to the patient's satisfaction.     [name]

## 2012-02-17 NOTE — Progress Notes (Signed)
Consent held re: signature due to site not being addressed.  Or staff informed(Shalonda).  Pt also has earring that is not movable.

## 2012-02-17 NOTE — Anesthesia Postprocedure Evaluation (Signed)
  Anesthesia Post-op Note  Patient: [name] [name]  Procedure(s) Performed: Procedure(s):  DRAINAGEof recurrent breast abscess (Left)  Patient Location: PACU  Anesthesia Type:General  Level of Consciousness: awake, alert  and oriented  Airway and Oxygen Therapy: Patient Spontanous Breathing  Post-op Pain: moderate  Post-op Assessment: Post-op Vital signs reviewed, Patient's Cardiovascular Status Stable, Respiratory Function Stable, Patent Airway, No signs of Nausea or vomiting and Pain level controlled  Post-op Vital Signs: Reviewed and stable  Complications: No apparent anesthesia complications

## 2012-02-17 NOTE — Op Note (Signed)
DRAINAGEof recurrent breast abscess  Procedure Note  [name] [name] 02/17/2012   Pre-op Diagnosis: breast abscess LEFT     Post-op Diagnosis: same  Procedure(s):  INCISION, DRAINAGE, AND DEBRIDEMENT LEFT BREAST RECURRENT ABSCESS  Surgeon(s): Shelly Rubenstein, MD  Anesthesia: General  Staff:  Circulator: Carlean Purl, RN Scrub Person: Illene Bolus, CST  Estimated Blood Loss: Minimal               Specimens: left breast tissue sent to path  Indications: This is [name] 27 year old female who presents with chronic recurrent left breast abscess. She was seen by Dr. Jamey Ripa in our office who suggested debridement in the operating room.  Findings: There was no further purulence identified. There was [name] lot of chronic granulation tissue underneath the areola which was excised. This was sent to pathology for evaluation. Cultures were also obtained of the cavity.  Procedure: The patient was brought to the operating room and identified as the correct patient. She was placed supine on the operating room table and general anesthesia was induced. Her left breast was then prepped and draped in the usual sterile fashion. I anesthetized the skin around the chronic wound at the superior portion of the areola with half percent Marcaine with epinephrine. I then performed an elliptical incision excising the chronic scar and open wound. I did this down into the breast tissue with electrocautery. [name] cavity was entered and there was no further purulence. Cultures were obtained of the cavity. I then excised all chronic granulation tissue going underneath the areola with the electrocautery. This was sent with the surrounding normal breast tissue to pathology. Hemostasis was achieved with the cautery. I irrigated the wound thoroughly with saline. I placed [name] 2-0 nylon suture at the lateral and medial portions of the incision to attempt to leave less of [name] cosmetic defect. I then packed the wound with wet-to-dry  saline gauze. Dry gauze was placed over the top. The patient tolerated the procedure well. All the counts were correct at the end of the procedure. The patient was then extubated in the operating room and taken in [name] stable condition to the recovery room.          [name] [name]   Date: 02/17/2012  Time: 8:36 AM

## 2012-02-17 NOTE — H&P (Signed)
  Subjective:   Patient ID: [name] [name], female DOB: [birth year], 27 y.o. MRN: [id number]  HPIShe has not resolved the abscess that I drained two weeks ago and continues to have pain and drainage, despite antibiotic.  She is followed closely by Dr. Jamey Ripa.  Review of Systems  No change from last visits  Objective:   Physical Exam  Vitals reviewed.  Constitutional: She is oriented to person, place, and time. She appears well-developed and well-nourished. No distress.  HENT:  Head: Normocephalic and atraumatic.  Mouth/Throat: Oropharynx is clear and moist.  Eyes: Conjunctivae and EOM are normal. Pupils are equal, round, and reactive to light. No scleral icterus.  Neck: Normal range of motion. Neck supple. No tracheal deviation present. No thyromegaly present.  Cardiovascular: Normal rate, regular rhythm, normal heart sounds and intact distal pulses. Exam reveals no gallop and no friction rub.  No murmur heard.  Pulmonary/Chest: Effort normal and breath sounds normal. No respiratory distress. She has no wheezes. She has no rales. Right breast exhibits no inverted nipple, no mass, no nipple discharge, no skin change and no tenderness. Left breast exhibits tenderness.    Abdominal: Soft. Bowel sounds are normal. She exhibits no distension and no mass. There is no tenderness. There is no rebound and no guarding.  Musculoskeletal: Normal range of motion. She exhibits no edema and no tenderness.  Neurological: She is alert and oriented to person, place, and time.  Skin: Skin is warm and dry. No rash noted. She is not diaphoretic. No erythema.  Psychiatric: She has a normal mood and affect. Her behavior is normal. Judgment and thought content normal.  Vitals: BP 128/76  Pulse 99  Temp(Src) 99 F (37.2 C)  Resp 18  Ht 5' (1.524 m)  Wt 160 lb (72.576 kg)  BMI 31.25 kg/m2  Assessment:   Chronic breast abscess, recurrent with active infection  Plan:   I think this needs to be opened and  debrided in the OR and possible ductal excision  Discussed with her and she wants to proceed. Will add her to the OR schedule for today.   Gave he Rx for thirty percocet so she has pain meds for post op

## 2012-02-17 NOTE — Preoperative (Signed)
Beta Blockers   Reason not to administer Beta Blockers:Not Applicable 

## 2012-02-17 NOTE — Transfer of Care (Signed)
Immediate Anesthesia Transfer of Care Note  Patient: [name] [name]  Procedure(s) Performed: Procedure(s):  DRAINAGEof recurrent breast abscess (Left)  Patient Location: PACU  Anesthesia Type:General  Level of Consciousness: awake, alert  and oriented  Airway & Oxygen Therapy: Patient Spontanous Breathing and Patient connected to nasal cannula oxygen  Post-op Assessment: Report given to PACU RN, Post -op Vital signs reviewed and stable and Patient moving all extremities  Post vital signs: Reviewed and stable  Complications: No apparent anesthesia complications

## 2012-02-17 NOTE — Anesthesia Preprocedure Evaluation (Addendum)
Anesthesia Evaluation  Patient identified by MRN, date of birth, ID band Patient awake    Reviewed: Allergy & Precautions, H&P , NPO status , Patient's Chart, lab work & pertinent test results  Airway Mallampati: II TM Distance: >3 FB Neck ROM: Full    Dental no notable dental hx. (+) Teeth Intact and Dental Advisory Given   Pulmonary asthma ,  breath sounds clear to auscultation  Pulmonary exam normal       Cardiovascular negative cardio ROS  Rhythm:Regular Rate:Normal     Neuro/Psych PSYCHIATRIC DISORDERS negative neurological ROS     GI/Hepatic negative GI ROS, Neg liver ROS,   Endo/Other  negative endocrine ROS  Renal/GU negative Renal ROS  negative genitourinary   Musculoskeletal   Abdominal   Peds  Hematology negative hematology ROS (+)   Anesthesia Other Findings   Reproductive/Obstetrics negative OB ROS                          Anesthesia Physical Anesthesia Plan  ASA: II  Anesthesia Plan: General   Post-op Pain Management:    Induction: Intravenous  Airway Management Planned: LMA  Additional Equipment:   Intra-op Plan:   Post-operative Plan: Extubation in OR  Informed Consent: I have reviewed the patients History and Physical, chart, labs and discussed the procedure including the risks, benefits and alternatives for the proposed anesthesia with the patient or authorized representative who has indicated his/her understanding and acceptance.   Dental advisory given  Plan Discussed with: CRNA  Anesthesia Plan Comments:         Anesthesia Quick Evaluation

## 2012-02-17 NOTE — Progress Notes (Signed)
Pt's fiancee went to rx filled.  Pharmicist told fiancee that they no longer stock 7.5-500mg  Hydrocodone-Acetaminophen.  Dr. Abigail Miyamoto called & notified and orders received.  Hydrocodone-Acetaminophen 7.5-325mg  called in to [facility] per Dr. Eliberto Ivory order.

## 2012-02-19 ENCOUNTER — Telehealth (INDEPENDENT_AMBULATORY_CARE_PROVIDER_SITE_OTHER): Payer: Self-pay | Admitting: *Deleted

## 2012-02-19 NOTE — Progress Notes (Signed)
Post op call patient stated that she removed packing to breast because it was causing pressure which was causing pain.Instructed  patient to call MD's office and inform them that packing was removed and that pain med was not relieving pain. Patient stated that she would.

## 2012-02-19 NOTE — Telephone Encounter (Signed)
Patient called to state that she is having severe pain.  Patient denies any signs of infection at this time.  Patient states she is using her pain medication as prescribed but it is not helping.  Suggested to patient to use NSAIDs for break through pain.  Also suggested using ice for swelling but patient states ice only increases pain.  Patient states understanding at this time and will call if there are any signs of infection.

## 2012-02-20 LAB — CULTURE, ROUTINE-ABSCESS: Culture: NO GROWTH

## 2012-02-21 ENCOUNTER — Encounter: Payer: Self-pay | Admitting: Family Medicine

## 2012-02-21 ENCOUNTER — Ambulatory Visit (INDEPENDENT_AMBULATORY_CARE_PROVIDER_SITE_OTHER): Payer: Medicaid Other | Admitting: Family Medicine

## 2012-02-21 VITALS — BP 117/68 | HR 118 | Temp 98.4°F | Ht 65.0 in | Wt 159.0 lb

## 2012-02-21 DIAGNOSIS — J45909 Unspecified asthma, uncomplicated: Secondary | ICD-10-CM

## 2012-02-21 MED ORDER — FLUTICASONE PROPIONATE HFA 220 MCG/ACT IN AERO: 1.0000 | INHALATION_SPRAY | Freq: Two times a day (BID) | RESPIRATORY_TRACT | Status: DC

## 2012-02-21 NOTE — Progress Notes (Signed)
  Subjective:    Patient ID: [name] [name], female    DOB: 1985/05/15, 27 y.o.   MRN: [id number]  HPI  Asthma Using albuterol every other day or so.  No wheezing or nocturnal cough.  Not using any preventer since did not think Medicaid would pay for.  Breast abscess Status post drainage thinks is improving  Review of Systems     Objective:   Physical Exam  Lungs:  Normal respiratory effort, chest expands symmetrically. Lungs are clear to auscultation, no crackles or wheezes. Extremities:  No cyanosis, edema, or deformity noted with good range of motion of all major joints.  ,       Assessment & Plan:

## 2012-02-21 NOTE — Patient Instructions (Addendum)
Use the Fluticasone inhaler twice daily every day Call if any problems getting it  Come back in March for a Pap smear

## 2012-02-21 NOTE — Assessment & Plan Note (Signed)
Will start fluticasone and see if Medicare will pay for.  Discussed need for continuous preventer inhaler

## 2012-02-22 ENCOUNTER — Ambulatory Visit (INDEPENDENT_AMBULATORY_CARE_PROVIDER_SITE_OTHER): Payer: Medicaid Other | Admitting: Surgery

## 2012-02-22 ENCOUNTER — Encounter (INDEPENDENT_AMBULATORY_CARE_PROVIDER_SITE_OTHER): Payer: Self-pay | Admitting: Surgery

## 2012-02-22 VITALS — BP 128/64 | HR 68 | Resp 12 | Ht 60.0 in | Wt 160.0 lb

## 2012-02-22 DIAGNOSIS — N61 Mastitis without abscess: Secondary | ICD-10-CM

## 2012-02-22 LAB — ANAEROBIC CULTURE

## 2012-02-22 NOTE — Progress Notes (Signed)
Urgent Office:  S:  Ms. Gracelyn Nurse had a wide excision of infected breast tissue of the left breast by Dr. Magnus Ivan on 02/17/2012.  Her cultures showed MRSA.  Her path was benign.  I gave her a copy of the path.  BP 128/64  Pulse 68  Resp 12  Ht 5' (1.524 m)  Wt 160 lb (72.576 kg)  BMI 31.25 kg/m2  LMP 01/29/2012  She has an open wound at the 12 o'clock aspect of her left breast.  There are two sutures (one on each side).  And I think that this is the source of some of her pain.  I removed the sutures and probed the wound with a q tip.  There was no trapped infection.  And the wound looks reasonably clean to me.  Plan: Continue dressing changes 2 to 3 times per day. See Dr. Magnus Ivan back next week.  Ovidio Kin, MD, [facility] Pager: [phone] Office phone:  [phone]

## 2012-02-27 ENCOUNTER — Encounter (INDEPENDENT_AMBULATORY_CARE_PROVIDER_SITE_OTHER): Payer: Medicaid Other | Admitting: Surgery

## 2012-02-29 ENCOUNTER — Encounter (INDEPENDENT_AMBULATORY_CARE_PROVIDER_SITE_OTHER): Payer: Self-pay | Admitting: Surgery

## 2012-03-04 ENCOUNTER — Encounter (INDEPENDENT_AMBULATORY_CARE_PROVIDER_SITE_OTHER): Payer: Medicaid Other | Admitting: Surgery

## 2012-03-27 ENCOUNTER — Telehealth: Payer: Self-pay | Admitting: Family Medicine

## 2012-03-27 DIAGNOSIS — N61 Mastitis without abscess: Secondary | ICD-10-CM

## 2012-03-27 NOTE — Telephone Encounter (Signed)
Pt had surgery and it is still not better needs referral to [facility] - was told to call about this

## 2012-03-28 NOTE — Telephone Encounter (Signed)
Called and spoke with her.  Since still under care of surgery would let her wound heal and discuss it with her surgeons.  If still having difficulties tc referral to [facility] Center at [facility]

## 2012-04-10 ENCOUNTER — Other Ambulatory Visit (HOSPITAL_COMMUNITY): Payer: Self-pay | Admitting: Family Medicine

## 2012-04-15 ENCOUNTER — Telehealth: Payer: Self-pay | Admitting: Family Medicine

## 2012-04-15 NOTE — Telephone Encounter (Signed)
Pt is asking why her inhalers look different and why she only received one inhaler instead of two like the previous times.  Advised where I do not see where anything was changed on our end. She will check with the pharmacy and give [place] a call back if there is something we need to change. Trevor Wilkie, [name]

## 2012-04-15 NOTE — Telephone Encounter (Signed)
Pt is asking to speak to nurse about her inhalers - has questions about both

## 2012-05-15 ENCOUNTER — Telehealth: Payer: Self-pay | Admitting: Family Medicine

## 2012-05-15 NOTE — Telephone Encounter (Signed)
Need to discuss referral for breast.  Had surgery and was doing fine until recently.  Now having problems with the same breast.  Please call patient asap to discuss next option.

## 2012-05-15 NOTE — Telephone Encounter (Signed)
LMOVM for pt to return call.  She may either call her surgeon back (I believe it is [facility]) or she may make an appt here. [name], [name]

## 2012-05-16 NOTE — Telephone Encounter (Signed)
See Phone note 3-26.   She should contact [facility] they may want to refer her to [facility] Thanks [facility]

## 2012-05-17 ENCOUNTER — Encounter: Payer: Self-pay | Admitting: Family Medicine

## 2012-05-17 ENCOUNTER — Ambulatory Visit (INDEPENDENT_AMBULATORY_CARE_PROVIDER_SITE_OTHER): Payer: Medicaid Other | Admitting: Family Medicine

## 2012-05-17 VITALS — BP 124/72 | HR 90 | Temp 98.2°F | Ht 60.0 in | Wt 155.4 lb

## 2012-05-17 DIAGNOSIS — N61 Mastitis without abscess: Secondary | ICD-10-CM

## 2012-05-17 MED ORDER — SULFAMETHOXAZOLE-TRIMETHOPRIM 800-160 MG PO TABS: 1.0000 | ORAL_TABLET | Freq: Two times a day (BID) | ORAL | Status: DC

## 2012-05-17 MED ORDER — TRAMADOL HCL 50 MG PO TABS: 50.0000 mg | ORAL_TABLET | Freq: Three times a day (TID) | ORAL | Status: AC | PRN

## 2012-05-17 NOTE — Assessment & Plan Note (Signed)
Rx for bactrim course x 1 week.  Tramadol for pain.   I do not feel an abscess has re-formed based on my exam today, and I told patient this.  She says she would like to go to [facility] and not [facility] again if she needs to see a Careers adviser again, which I think is reasonable.  F/U in one week.

## 2012-05-17 NOTE — Progress Notes (Signed)
  Subjective:    Patient ID: [name] [name], female    DOB: 07-Aug-1985, 27 y.o.   MRN: [id number]  HPI  [name] [name] comes in for worsening L breast pain and drainage.  She had several episodes of recurrent mastitis that were treated with antibiotics.  She has also now had 2 surgeries at [facility] for this problem because an abscess formed.  She says her last surgery was in February, and she has continued to have drainage.  She says if she lets the scab from, she will have swelling again of the breast so she picks the scab off.  She says the pain is "bad enough to go to the ER" but hasn't gone because she knows what it is.  Denies fevers or chills.    Review of Systems See HPI    Objective:   Physical Exam BP 124/72  Pulse 90  Temp(Src) 98.2 F (36.8 C) (Oral)  Ht 5' (1.524 m)  Wt 155 lb 7 oz (70.506 kg)  BMI 30.36 kg/m2  LMP 04/21/2012  Breastfeeding? No General appearance: alert, cooperative and no distress Breast: Left breast with healing incision and scab above nipple, curved incision follows nipple, with some honey color in the scab.  There is some mild induration at the site, but no surrounding erythema, no drainage can be expressed.  No fluctuance noted.       Assessment & Plan:

## 2012-05-17 NOTE — Patient Instructions (Signed)
I am sorry you have had such problems with your breast infection.  I have prescribed a different antibiotic to see if it will help the drainage.  You can use the tramadol as needed for pain.  Please come back in one week if you are not improving.

## 2012-06-04 ENCOUNTER — Telehealth: Payer: Self-pay | Admitting: Family Medicine

## 2012-06-04 NOTE — Telephone Encounter (Signed)
Pt will schedule appt with Dr. Deirdre Priest on 06/11/2012.  Instructed pt if she gets worse or has feelings of HI/SI to go to [facility] ED to be evaluated.  Pt verbalized understanding and said she could wait until appt. Caterin Tabares, Darlyne Russian, CMA

## 2012-06-04 NOTE — Telephone Encounter (Signed)
Pt needs to come in to discuss depression.  Has been on medication and was weaned off. Now needs to go back on them. Please advise

## 2012-06-11 ENCOUNTER — Encounter: Payer: Self-pay | Admitting: Family Medicine

## 2012-06-11 ENCOUNTER — Ambulatory Visit (INDEPENDENT_AMBULATORY_CARE_PROVIDER_SITE_OTHER): Payer: Medicaid Other | Admitting: Family Medicine

## 2012-06-11 VITALS — BP 117/75 | HR 102 | Temp 98.4°F | Wt 152.0 lb

## 2012-06-11 DIAGNOSIS — J45909 Unspecified asthma, uncomplicated: Secondary | ICD-10-CM

## 2012-06-11 DIAGNOSIS — IMO0001 Reserved for inherently not codable concepts without codable children: Secondary | ICD-10-CM | POA: Insufficient documentation

## 2012-06-11 DIAGNOSIS — N912 Amenorrhea, unspecified: Secondary | ICD-10-CM

## 2012-06-11 DIAGNOSIS — Z309 Encounter for contraceptive management, unspecified: Secondary | ICD-10-CM

## 2012-06-11 DIAGNOSIS — F329 Major depressive disorder, single episode, unspecified: Secondary | ICD-10-CM

## 2012-06-11 DIAGNOSIS — F3289 Other specified depressive episodes: Secondary | ICD-10-CM

## 2012-06-11 MED ORDER — NORGESTIMATE-ETH ESTRADIOL 0.25-35 MG-MCG PO TABS: 1.0000 | ORAL_TABLET | Freq: Every day | ORAL | Status: DC

## 2012-06-11 NOTE — Progress Notes (Signed)
  Subjective:    Patient ID: [name] [name], female    DOB: 02-Jan-1986, 27 y.o.   MRN: [id number]  HPI  Depression For last month has felt down, very little energy, sleeping more.  No suicidal ideation.  Last antidepressant was 6 mo ago.  Recently married - no major stressors PHQ - 18 No history of bipolar symptoms.  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  No bleeding since  Took 2 home preg tests one was + one was -.   Does not feel exactly pregnant.  On no contraception and does not desire pregnancy.  Asthma Using inhalers regularly - no symptoms  Review of Symptoms - see HPI  PMH - Smoking status noted.     Review of Systems     Objective:   Physical Exam  Alert no acute distress Psych:  Cognition and judgment appear intact. Alert, communicative  and cooperative with normal attention span and concentration. No apparent delusions, illusions, hallucinations Well groomed good eye contact Lungs:  Normal respiratory effort, chest expands symmetrically. Lungs are clear to auscultation, no crackles or wheezes.       Assessment & Plan:

## 2012-06-11 NOTE — Patient Instructions (Addendum)
Check a pregnancy test if you do not have your next menstrual periods  Contact a counselor asap  If you are feeling worse from depression and would like to start back the citalopram call me  Start the BCP the day your next menstrual period starts  Come back in 2-3 weeks unless everything is fine

## 2012-06-11 NOTE — Assessment & Plan Note (Signed)
Would like contraception until her husband gets vasectomy.   Will start BCPs with next menstrual periods and monitor

## 2012-06-11 NOTE — Assessment & Plan Note (Signed)
Recurrent after PPD.   She does not feel wants to restart medication until sure not pregnant.  I agree.  She would like to see a counselor - given list of ones who take Medicaid.  She will call them.  See back soon

## 2012-06-11 NOTE — Assessment & Plan Note (Signed)
Stable using medications correctly

## 2012-06-20 ENCOUNTER — Telehealth: Payer: Self-pay | Admitting: Family Medicine

## 2012-06-20 NOTE — Telephone Encounter (Signed)
Addendum: no better.  Will fwd to md.  Radene Ou, CMA

## 2012-06-20 NOTE — Telephone Encounter (Signed)
Pt seen 06/11/12 by Dr. Deirdre Priest.  In discharge note, states he would restart citaprolam if pt's depression

## 2012-06-20 NOTE — Telephone Encounter (Signed)
Patient calls stating that her depression has not improved, and she would like to be re-prescribed the meds she was originally taking for her depression.

## 2012-06-21 MED ORDER — CITALOPRAM HYDROBROMIDE 20 MG PO TABS: 20.0000 mg | ORAL_TABLET | Freq: Every day | ORAL | Status: DC

## 2012-06-21 NOTE — Telephone Encounter (Signed)
Pt notified.  Tristain Daily L, CMA  

## 2012-06-21 NOTE — Telephone Encounter (Signed)
I sent in Rx for citalopram Please ask if she has set up an appointment with a counselor and remind her is she has not I should see her back in 2-3 weeks  Thanks  LC

## 2012-08-06 ENCOUNTER — Emergency Department (HOSPITAL_COMMUNITY)
Admission: EM | Admit: 2012-08-06 | Discharge: 2012-08-06 | Disposition: A | Discharge status: Home / Self Care | Payer: Medicaid Other | Attending: Emergency Medicine | Admitting: Emergency Medicine

## 2012-08-06 ENCOUNTER — Encounter (HOSPITAL_COMMUNITY): Payer: Self-pay | Admitting: *Deleted

## 2012-08-06 DIAGNOSIS — Z8701 Personal history of pneumonia (recurrent): Secondary | ICD-10-CM | POA: Insufficient documentation

## 2012-08-06 DIAGNOSIS — Z8742 Personal history of other diseases of the female genital tract: Secondary | ICD-10-CM | POA: Insufficient documentation

## 2012-08-06 DIAGNOSIS — F329 Major depressive disorder, single episode, unspecified: Secondary | ICD-10-CM | POA: Insufficient documentation

## 2012-08-06 DIAGNOSIS — Z872 Personal history of diseases of the skin and subcutaneous tissue: Secondary | ICD-10-CM | POA: Insufficient documentation

## 2012-08-06 DIAGNOSIS — Z79899 Other long term (current) drug therapy: Secondary | ICD-10-CM | POA: Insufficient documentation

## 2012-08-06 DIAGNOSIS — Z331 Pregnant state, incidental: Secondary | ICD-10-CM | POA: Insufficient documentation

## 2012-08-06 DIAGNOSIS — IMO0002 Reserved for concepts with insufficient information to code with codable children: Secondary | ICD-10-CM | POA: Insufficient documentation

## 2012-08-06 DIAGNOSIS — R51 Headache: Secondary | ICD-10-CM | POA: Insufficient documentation

## 2012-08-06 DIAGNOSIS — Z87891 Personal history of nicotine dependence: Secondary | ICD-10-CM | POA: Insufficient documentation

## 2012-08-06 DIAGNOSIS — F3289 Other specified depressive episodes: Secondary | ICD-10-CM | POA: Insufficient documentation

## 2012-08-06 DIAGNOSIS — J029 Acute pharyngitis, unspecified: Secondary | ICD-10-CM | POA: Insufficient documentation

## 2012-08-06 DIAGNOSIS — J45909 Unspecified asthma, uncomplicated: Secondary | ICD-10-CM | POA: Insufficient documentation

## 2012-08-06 LAB — MONONUCLEOSIS SCREEN: Mono Screen: NEGATIVE

## 2012-08-06 LAB — RAPID STREP SCREEN (MED CTR MEBANE ONLY): Streptococcus, Group A Screen (Direct): NEGATIVE

## 2012-08-06 MED ORDER — LIDOCAINE VISCOUS 2 % MT SOLN
20.0000 mL | Freq: Once | OROMUCOSAL | Status: AC
  Administered 2012-08-06: 20 mL via OROMUCOSAL
  Filled 2012-08-06: qty 15

## 2012-08-06 MED ORDER — DEXAMETHASONE SODIUM PHOSPHATE 10 MG/ML IJ SOLN
10.0000 mg | Freq: Once | INTRAMUSCULAR | Status: AC
  Administered 2012-08-06: 10 mg via INTRAMUSCULAR
  Filled 2012-08-06: qty 1

## 2012-08-06 MED ORDER — LIDOCAINE VISCOUS 2 % MT SOLN: 15.0000 mL | OROMUCOSAL | Status: DC | PRN

## 2012-08-06 NOTE — ED Provider Notes (Signed)
CSN: [id number]     Arrival date & time 08/06/12  1707 History     First MD Initiated Contact with Patient 08/06/12 2042     Chief Complaint  Patient presents with  . Sore Throat  . Headache   (Consider location/radiation/quality/duration/timing/severity/associated sxs/prior Treatment) HPI  [name] [name] is a 27 y.o. female complaining of pharyngitis, headache and cervicalgia starting yesterday initiated with shortness of breath. Patient denies fever, cough, rhinorrhea, pain, nausea vomiting, sick contacts. Through. It is 8/10, located in the right temporal region, consistent with prior headache episodes.  Past Medical History  Diagnosis Date  . Asthma   . Pregnant state, incidental   . Breast abscess   . Family history of anesthesia complication     Grandmother has difficulty waskin up.  . Depression     HX of - not on med n, doing well.  . Pneumonia     years ago has had a few times.  . Eczema    Past Surgical History  Procedure Laterality Date  . No past surgeries    . Incise and drain abcess      left breast  . Incision and drainage abscess Left 02/17/2012    Procedure:  DRAINAGEof recurrent breast abscess;  Surgeon: Shelly Rubenstein, MD;  Location: [facility] OR;  Service: General;  Laterality: Left;   Family History  Problem Relation Age of Onset  . Healthy Mother   . Healthy Father   . Stroke Maternal Grandmother   . Stroke Paternal Grandfather    History  Substance Use Topics  . Smoking status: Former Smoker    Quit date: 06/22/2010  . Smokeless tobacco: Never Used  . Alcohol Use: No   OB History   Grav Para Term Preterm Abortions TAB SAB Ect Mult Living   1 1  1      1      Review of Systems 10 systems reviewed and found to be negative, except as noted in the HPI   Allergies  Doxycycline  Home Medications   Current Outpatient Rx  Name  Route  Sig  Dispense  Refill  . albuterol (PROVENTIL HFA;VENTOLIN HFA) 108 (90 BASE) MCG/ACT inhaler   Inhalation   Inhale 2 puffs into the lungs every 6 (six) hours as needed for wheezing.         . citalopram (CELEXA) 20 MG tablet   Oral   Take 20 mg by mouth daily.         . diphenhydrAMINE (BENADRYL) 25 mg capsule   Oral   Take 25 mg by mouth daily as needed for itching or allergies.         . fluticasone (FLOVENT HFA) 220 MCG/ACT inhaler   Inhalation   Inhale 1 puff into the lungs 2 (two) times daily.         Marland Kitchen ibuprofen (ADVIL,MOTRIN) 200 MG tablet   Oral   Take 400 mg by mouth 2 (two) times daily as needed for pain.          BP 130/70  Pulse 101  Temp(Src) 98.6 F (37 C) (Oral)  Resp 16  SpO2 99% Physical Exam  Nursing note and vitals reviewed. Constitutional: She is oriented to person, place, and time. She appears well-developed and well-nourished. No distress.  HENT:  Head: Normocephalic.  Mouth/Throat: Oropharynx is clear and moist. No oropharyngeal exudate.  posterior pharynx is mildly injected  Eyes: Conjunctivae and EOM are normal. Pupils are equal, round, and reactive to light.  Neck: Normal range of motion. Neck supple.  Cardiovascular: Normal rate, regular rhythm and intact distal pulses.   Pulmonary/Chest: Effort normal and breath sounds normal. No stridor. No respiratory distress. She has no wheezes. She has no rales. She exhibits no tenderness.  Abdominal: Soft. Bowel sounds are normal. She exhibits no distension and no mass. There is no tenderness. There is no rebound and no guarding.  Musculoskeletal: Normal range of motion.  Lymphadenopathy:    She has cervical adenopathy.  Neurological: She is alert and oriented to person, place, and time.  Psychiatric: She has a normal mood and affect.    ED Course   Procedures (including critical care time)  Labs Reviewed  RAPID STREP SCREEN  CULTURE, GROUP A STREP  MONONUCLEOSIS SCREEN   No results found. 1. Pharyngitis     MDM   Filed Vitals:   08/06/12 1728  BP: 130/70  Pulse: 101  Temp: 98.6 F  (37 C)  TempSrc: Oral  Resp: 16  SpO2: 99%     [name] [name] is a 27 y.o. female with pharyngitis and headache. Strep and mono are negative. Patient will be given shot of Decadron and lidocaine viscous  Medications  dexamethasone (DECADRON) injection 10 mg (10 mg Intramuscular Given 08/06/12 2132)  lidocaine (XYLOCAINE) 2 % viscous mouth solution 20 mL (20 mLs Mouth/Throat Given 08/06/12 2131)    Pt is hemodynamically stable, appropriate for, and amenable to discharge at this time. Pt verbalized understanding and agrees with care plan. All questions answered. Outpatient follow-up and specific return precautions discussed.    Discharge Medication List as of 08/06/2012 11:11 PM    START taking these medications   Details  lidocaine (XYLOCAINE) 2 % solution Take 15 mLs by mouth every 3 (three) hours as needed for pain., Starting 08/06/2012, Until Discontinued, Print        Note: Portions of this report may have been transcribed using voice recognition software. Every effort was made to ensure accuracy; however, inadvertent computerized transcription errors may be present    Wynetta Emery, PA-C 08/08/12 0033

## 2012-08-06 NOTE — ED Notes (Signed)
Pt states yesterday started getting sore throat, then headache and neck pain.  PT states that her airway feels restricted, but talking in complete sentences and no evidence of distress

## 2012-08-08 ENCOUNTER — Telehealth: Payer: Self-pay | Admitting: Family Medicine

## 2012-08-08 LAB — CULTURE, GROUP A STREP

## 2012-08-08 NOTE — Telephone Encounter (Signed)
Please reauthorize her referral.  Let me know if I need to put in a new one   Thanks  LC

## 2012-08-08 NOTE — Telephone Encounter (Signed)
Patient is calling because she has been to the [facility] Abscess and has always been able to schedule her own appts, but now they are requesting a referral for her to come there.  Please call her when the referral has been sent.

## 2012-08-10 NOTE — ED Provider Notes (Signed)
Medical screening examination/treatment/procedure(s) were performed by non-physician practitioner and as supervising physician I was immediately available for consultation/collaboration.   Shelda Jakes, MD 08/10/12 1051

## 2012-08-15 ENCOUNTER — Telehealth: Payer: Self-pay | Admitting: Family Medicine

## 2012-08-15 NOTE — Telephone Encounter (Signed)
Pt called back for the status of her referral. She called the center today and they said they have not gotten a referral from [place]. JW

## 2012-08-15 NOTE — Telephone Encounter (Signed)
Appointment scheduled with Dr. Daphine Deutscher at [facility] for 08/16/2012 at 3:30pm.  Toni Amend notified. Unsure if she will be able to go tomorrow.  I advised she could call [facility] back and reschedule for what works best for her.  [name] [name]

## 2012-08-16 ENCOUNTER — Encounter (INDEPENDENT_AMBULATORY_CARE_PROVIDER_SITE_OTHER): Payer: Medicaid Other | Admitting: Surgery

## 2012-08-16 NOTE — Telephone Encounter (Signed)
Patient has appt with Dr. Daphine Deutscher today.  Office calling to get NPI # to authorize appt.  NPI # given for 3 visits.  Gaylene Brooks, RN

## 2012-08-20 NOTE — Telephone Encounter (Signed)
Has this been addressed?

## 2012-08-20 NOTE — Telephone Encounter (Signed)
Yes there is an appt coming up in September for her to see [facility].  Tried calling pt and had to leave a message for her.  Please give her appt time when she calls back.  Thanks [employer]

## 2012-09-04 ENCOUNTER — Encounter (INDEPENDENT_AMBULATORY_CARE_PROVIDER_SITE_OTHER): Payer: Self-pay | Admitting: Surgery

## 2012-09-04 ENCOUNTER — Ambulatory Visit (INDEPENDENT_AMBULATORY_CARE_PROVIDER_SITE_OTHER): Payer: Medicaid Other | Admitting: Surgery

## 2012-09-04 VITALS — BP 134/78 | HR 94 | Temp 97.8°F | Resp 16 | Ht 60.0 in | Wt 159.8 lb

## 2012-09-04 DIAGNOSIS — N61 Mastitis without abscess: Secondary | ICD-10-CM

## 2012-09-04 NOTE — Progress Notes (Signed)
[name] [name] 27 y.o.  Body mass index is 31.21 kg/(m^2).  Patient Active Problem List   Diagnosis Date Noted  . Contraception 06/11/2012  . Mastitis, Left. 11/27/2011  . Depression 03/24/2011  . ASTHMA, PERSISTENT 03/01/2006  . ECZEMA, ATOPIC DERMATITIS 03/01/2006    Allergies  Allergen Reactions  . Doxycycline     Photo sensitivity    Past Surgical History  Procedure Laterality Date  . No past surgeries    . Incise and drain abcess      left breast  . Incision and drainage abscess Left 02/17/2012    Procedure:  DRAINAGEof recurrent breast abscess;  Surgeon: Shelly Rubenstein, MD;  Location: [facility] OR;  Service: General;  Laterality: Left;   CHAMBLISS,MARSHALL L, MD No diagnosis found.  Patient returns to me after a long absence having seen multiple mild partners. I initially saw her when she was pregnant and she had a left breast abscess and we were in a followup with her but her followup is delayed. She certainly had a couple of areas in the left breast that required open drainage. She's continued to have recurrent pain and abscess with drainage. A time she's been cultured and she's MRSA positive.   She had a recurrent swelling of the left breast that drained spontaneously.  It is nontender to the touch now.  There is a small opening with crusty material at the opening.  No active draining.    I discussed her with Dr. Jamey Ripa who had  seen her in the past .  I offered her repeat wide reexcision to see if we can eradicate a nidus for continued infection. She asked about a second opinion and I offered to send her to [facility] to see into breast surgeon, Dr. Deretha Emory. I will give her the information and she will attempt to make an appointment with Dr. Deretha Emory who I don't know if she accepts new Medicaid patients. If she decides to come back to [facility] I would be  happy to see her back here in care for her.  Matt B. Daphine Deutscher, MD, [facility] [phone]  beeper [phone]  09/04/2012 2:43 PM

## 2012-09-04 NOTE — Patient Instructions (Signed)
Mastitis   Mastitis is a bacterial infection of the breast tissue.  CAUSES   Bacteria causes infection by entering the breast tissue through cuts or openings in the skin. Typically, this occurs with breastfeeding due to cracked or irritated skin. It can be associated with plugged ducts. Nipple piercing can also lead to mastitis.  SYMPTOMS   In mastitis, an area of the breast becomes swollen, red, tender, and painful. You may notice you have a fever and swelling of the glands under your arm on that side. If the infection is allowed to progress, a collection of pus (abscess) may develop.  DIAGNOSIS   Your caregiver can diagnose mastitis based on your symptoms and upon examination. The diagnosis can be confirmed if pus can be expressed from the breast. This pus can be examined in the lab to determine which bacteria are present. If an abscess has developed, the fluid in the abscess can be removed with a needle. This is used to confirm the diagnosis and determine the bacteria present. In most cases, pus will not be present. Blood tests can be done to determine if your body is fighting a bacterial infection. Sometimes, a mammogram or ultrasound will be recommended to exclude other breast diseases including cancer.  Other rare forms of mastitis:  · Tuberculosis mastitis is rare. The TB germ can affect the breast if it is present in some other part of the body. The breast may be slightly tender with a mass, but not tender or painful.  · Syphilis of the nipple usually has an ulcer that is not tender.  · Actinomycosis is a very rare bacterial infection of the breast that presents as a mass in the breast that is not tender or painful.  · Phlebitis (inflammation of blood vessels) of the breast is an inflammation of the veins in the breast. It may be caused by tight fitting bras, surgery, or trauma to the breast.  · Inflammatory carcinoma of the breast looks like mastitis because the breasts are red, swollen, or tender, but it  is a rare form of breast cancer.  TREATMENT   Antibiotic medication is used to treat the bacterial infection. Your caregiver will determine which bacteria are most likely to be causing the infection and select an antibiotic. This is sometimes changed based on the results of cultures, or if there is no response to the antibiotic selected. Antibiotics are usually given by mouth. If you are breastfeeding, it is important to continue to empty the breast. Your caregiver can tell you whether or not this milk is safe for your infant, or needs to be thrown away. Pain can usually be treated with medication.  HOME CARE INSTRUCTIONS   · Take your antibiotics as directed. Finish them even if you start to feel better.  · Only take over-the-counter or prescription medication for pain, discomfort, or fever as directed by your caregiver.  · If breastfeeding, keep your nipples clean and dry. Your caregiver may tell you to stop nursing until he or she feels it is safe for your baby. Use a breast pump as instructed if forced to stop nursing.  · Do not wear a tight bra. Wear a good support bra.  · Empty the first breast completely before going to the other breast. If your baby is not emptying your breasts completely for some reason, use a breast pump to empty your breasts.  · If you go back to work, pump your breasts while at work to stay in time   with your nursing schedule.  · Increase your fluid intake especially if you have a fever.  · Avoid having your breasts get overly filled with milk (engorged).  SEEK MEDICAL CARE IF:   · You develop pus-like (purulent) discharge from the breast.  · Your symptoms get worse.  · You do not seem to be responding to your treatment within 2 days.  SEEK IMMEDIATE MEDICAL CARE IF:   · You have a fever.  · Your pain and swelling is getting worse.  · You develop pain that is not controlled with medicine.  · You develop a red line extending from the breast toward your armpit.  Document Released:  12/19/2004 Document Revised: 03/13/2011 Document Reviewed: 08/09/2007  [employer]® Patient Information ©2014 [employer].

## 2012-09-19 ENCOUNTER — Telehealth: Payer: Self-pay | Admitting: Family Medicine

## 2012-09-19 ENCOUNTER — Encounter (HOSPITAL_COMMUNITY): Payer: Self-pay | Admitting: *Deleted

## 2012-09-19 ENCOUNTER — Emergency Department (HOSPITAL_COMMUNITY): Payer: Medicaid Other

## 2012-09-19 ENCOUNTER — Emergency Department (HOSPITAL_COMMUNITY)
Admission: EM | Admit: 2012-09-19 | Discharge: 2012-09-19 | Disposition: A | Discharge status: Home / Self Care | Payer: Medicaid Other | Attending: Emergency Medicine | Admitting: Emergency Medicine

## 2012-09-19 DIAGNOSIS — R079 Chest pain, unspecified: Secondary | ICD-10-CM | POA: Insufficient documentation

## 2012-09-19 DIAGNOSIS — J45901 Unspecified asthma with (acute) exacerbation: Secondary | ICD-10-CM | POA: Insufficient documentation

## 2012-09-19 DIAGNOSIS — Z8701 Personal history of pneumonia (recurrent): Secondary | ICD-10-CM | POA: Insufficient documentation

## 2012-09-19 DIAGNOSIS — Z8742 Personal history of other diseases of the female genital tract: Secondary | ICD-10-CM | POA: Insufficient documentation

## 2012-09-19 DIAGNOSIS — Z79899 Other long term (current) drug therapy: Secondary | ICD-10-CM | POA: Insufficient documentation

## 2012-09-19 DIAGNOSIS — Z87891 Personal history of nicotine dependence: Secondary | ICD-10-CM | POA: Insufficient documentation

## 2012-09-19 DIAGNOSIS — Z872 Personal history of diseases of the skin and subcutaneous tissue: Secondary | ICD-10-CM | POA: Insufficient documentation

## 2012-09-19 DIAGNOSIS — R630 Anorexia: Secondary | ICD-10-CM | POA: Insufficient documentation

## 2012-09-19 DIAGNOSIS — IMO0002 Reserved for concepts with insufficient information to code with codable children: Secondary | ICD-10-CM | POA: Insufficient documentation

## 2012-09-19 DIAGNOSIS — J3489 Other specified disorders of nose and nasal sinuses: Secondary | ICD-10-CM | POA: Insufficient documentation

## 2012-09-19 MED ORDER — ALBUTEROL SULFATE (5 MG/ML) 0.5% IN NEBU
5.0000 mg | INHALATION_SOLUTION | Freq: Once | RESPIRATORY_TRACT | Status: AC
  Administered 2012-09-19: 5 mg via RESPIRATORY_TRACT
  Filled 2012-09-19: qty 1

## 2012-09-19 MED ORDER — IPRATROPIUM BROMIDE 0.02 % IN SOLN
0.5000 mg | Freq: Once | RESPIRATORY_TRACT | Status: AC
  Administered 2012-09-19: 0.5 mg via RESPIRATORY_TRACT
  Filled 2012-09-19: qty 2.5

## 2012-09-19 MED ORDER — PREDNISONE 10 MG PO TABS: ORAL_TABLET | ORAL | Status: DC

## 2012-09-19 MED ORDER — ALBUTEROL (5 MG/ML) CONTINUOUS INHALATION SOLN
10.0000 mg/h | INHALATION_SOLUTION | Freq: Once | RESPIRATORY_TRACT | Status: AC
  Administered 2012-09-19: 10 mg/h via RESPIRATORY_TRACT
  Filled 2012-09-19: qty 20

## 2012-09-19 MED ORDER — ALBUTEROL SULFATE HFA 108 (90 BASE) MCG/ACT IN AERS: 1.0000 | INHALATION_SPRAY | Freq: Four times a day (QID) | RESPIRATORY_TRACT | Status: DC | PRN

## 2012-09-19 MED ORDER — PREDNISONE 20 MG PO TABS
60.0000 mg | ORAL_TABLET | Freq: Once | ORAL | Status: AC
  Administered 2012-09-19: 60 mg via ORAL
  Filled 2012-09-19: qty 3

## 2012-09-19 MED ORDER — ALBUTEROL SULFATE HFA 108 (90 BASE) MCG/ACT IN AERS
2.0000 | INHALATION_SPRAY | Freq: Once | RESPIRATORY_TRACT | Status: AC
  Administered 2012-09-19: 2 via RESPIRATORY_TRACT
  Filled 2012-09-19: qty 6.7

## 2012-09-19 MED ORDER — BENZONATATE 100 MG PO CAPS: 100.0000 mg | ORAL_CAPSULE | Freq: Three times a day (TID) | ORAL | Status: DC

## 2012-09-19 NOTE — Telephone Encounter (Signed)
To MD. Fleeger, Jessica Dawn  

## 2012-09-19 NOTE — ED Notes (Signed)
Patient transported to X-ray 

## 2012-09-19 NOTE — Telephone Encounter (Signed)
Pt call ed because she was if she wanted to get back on prednisone to let Dr. Deirdre Priest know and he would put her back on this. So she is asking to have a prescription called in. JW

## 2012-09-19 NOTE — Telephone Encounter (Signed)
Patient calls again. States she is in dire need of medicine. States that she will have to go to ED w/o medicine.

## 2012-09-19 NOTE — Telephone Encounter (Signed)
Spoke with pt and she is aware that her pcp is not in the office.  Told her that we have been getting responses to messages but hadn't heard from him today.  Pt is ok with this.  States that she knows that she needs medication today and will end up in ER tonight.  She is going go to ED and informed if she doesn't end up going to please make an appt for tomorrow.  [name] [name],CMA

## 2012-09-19 NOTE — ED Provider Notes (Signed)
.  Medical screening examination/treatment/procedure(s) were performed by non-physician practitioner and as supervising physician I was immediately available for consultation/collaboration.  Salvador Coupe N Ross Hefferan, DO 09/19/12 2258 

## 2012-09-19 NOTE — ED Provider Notes (Signed)
CSN: [id number]     Arrival date & time 09/19/12  1801 History  This chart was scribed for non-physician practitioner Jaynie Crumble, PA-C, working with Layla Maw Ward, DO by Dorothey Baseman, ED Scribe. This patient was seen in room TR11C/TR11C and the patient's care was started at 7:48 PM.    Chief Complaint  Patient presents with  . Asthma  . URI   The history is provided by the patient. No language interpreter was used.   HPI Comments: [name] [name] is a 27 y.o. female with a history of severe asthma and allergies who presents to the Emergency Department complaining of congestion and rhinorrhea onset 3 days ago with a dry cough onset yesterday. She states that she has used her nebulizer several times and completed 1 breathing treatment at home without relief. She states that she called her PCP (Dr. Deirdre Priest) to receive a refill of her nebulizer and steroids, but reports that they were unable to see her today. She reports associated chest pain and shortness of breath secondary to the cough. She reports feeling feverish at home, but did not measure her temperature. She reports a decrease in appetite, but reports that she has been drinking normally. She states that her child is currently sick with URI-like symptoms.  Past Medical History  Diagnosis Date  . Asthma   . Pregnant state, incidental   . Breast abscess   . Family history of anesthesia complication     Grandmother has difficulty waskin up.  . Depression     HX of - not on med n, doing well.  . Pneumonia     years ago has had a few times.  . Eczema    Past Surgical History  Procedure Laterality Date  . No past surgeries    . Incise and drain abcess      left breast  . Incision and drainage abscess Left 02/17/2012    Procedure:  DRAINAGEof recurrent breast abscess;  Surgeon: Shelly Rubenstein, MD;  Location: [facility] OR;  Service: General;  Laterality: Left;   Family History  Problem Relation Age of Onset  . Healthy Mother   .  Healthy Father   . Stroke Maternal Grandmother   . Stroke Paternal Grandfather    History  Substance Use Topics  . Smoking status: Former Smoker    Quit date: 06/22/2010  . Smokeless tobacco: Never Used  . Alcohol Use: No   OB History   Grav Para Term Preterm Abortions TAB SAB Ect Mult Living   1 1  1      1      Review of Systems  HENT: Positive for congestion and rhinorrhea.   Respiratory: Positive for cough and shortness of breath.   Cardiovascular: Positive for chest pain.  All other systems reviewed and are negative.    Allergies  Doxycycline  Home Medications   Current Outpatient Rx  Name  Route  Sig  Dispense  Refill  . albuterol (PROVENTIL HFA;VENTOLIN HFA) 108 (90 BASE) MCG/ACT inhaler   Inhalation   Inhale 2 puffs into the lungs every 6 (six) hours as needed for wheezing.         . citalopram (CELEXA) 20 MG tablet   Oral   Take 20 mg by mouth daily.         . fluticasone (FLOVENT HFA) 220 MCG/ACT inhaler   Inhalation   Inhale 1 puff into the lungs 2 (two) times daily.  Triage Vitals: BP 116/49  Pulse 120  Temp(Src) 98.5 F (36.9 C) (Oral)  Resp 18  SpO2 98%  LMP 08/21/2012  Physical Exam  Nursing note and vitals reviewed. Constitutional: She is oriented to person, place, and time. She appears well-developed and well-nourished. No distress.  HENT:  Head: Normocephalic and atraumatic.  Eyes: Conjunctivae are normal.  Neck: Normal range of motion. Neck supple.  Cardiovascular: Regular rhythm and normal heart sounds.   Tachycardic.   Pulmonary/Chest: Effort normal. No respiratory distress. She has wheezes.  Musculoskeletal: Normal range of motion.  Neurological: She is alert and oriented to person, place, and time.  Skin: Skin is warm and dry.  Psychiatric: She has a normal mood and affect. Her behavior is normal.    ED Course  Procedures (including critical care time)  Medications  albuterol (PROVENTIL) (5 MG/ML) 0.5%  nebulizer solution 5 mg (5 mg Nebulization Given 09/19/12 1919)  ipratropium (ATROVENT) nebulizer solution 0.5 mg (0.5 mg Nebulization Given 09/19/12 1919)  predniSONE (DELTASONE) tablet 60 mg (60 mg Oral Given 09/19/12 1935)  albuterol (PROVENTIL) (5 MG/ML) 0.5% nebulizer solution 5 mg (5 mg Nebulization Given 09/19/12 2007)   DIAGNOSTIC STUDIES: Oxygen Saturation is 98% on room air, normal by my interpretation.    COORDINATION OF CARE: 7:50PM- Ordered chest x-ray. Following the breathing treatment, the patient still had inspiratory and respiratory wheezes and was still tachycardic. Discussed treatment plan with patient at bedside and patient verbalized agreement.   8:48PM- Discussed that x-rays show bronchitis. Upon recheck, patient reports that she still feels some chest tightness, so will order another breathing treatment. Discussed treatment plan with patient at bedside and patient verbalized agreement.   Will discharge patient with Prednisone and a nebulizer to manage symptoms. Discussed treatment plan with patient at bedside and patient verbalized agreement.    Labs Review Labs Reviewed - No data to display  Imaging Review Dg Chest 2 View  09/19/2012   CLINICAL DATA:  Upper respiratory tract infection. Asthma.  EXAM: CHEST  2 VIEW  COMPARISON:  CHEST x-ray 12/29/2011.  FINDINGS: Lung volumes are normal. No acute consolidative airspace disease. No pleural effusions. Mild diffuse peribronchial cuffing. No evidence of pulmonary edema. No suspicious appearing pulmonary nodules or masses are identified. Heart size is normal. Mediastinal contours are unremarkable.  IMPRESSION: 1. Mild diffuse peribronchial cuffing, presumably related to the patient's history of reactive airway disease. No frank hyperexpansion at this time to strongly suggest an Asthma exacerbation.   Electronically Signed   By: Trudie Reed [name]   On: 09/19/2012 20:41    MDM   1. Asthma exacerbation     Patient with  history of asthma and is a smoker. Here with increased shortness of breath, wheezing, cough. She ran out of her inhaler. She came in with inspiratory and expiratory wheezes. X-ray obtained and results are as above. Patient is not in any respiratory distress, she is speaking full sinces. She was started on nebulized treatments. She was reassessed after each treatment and felt better each time but shortly after states that she started feeling tight again. She was started on the continuous neb for one hour and after which she did state that she felt better. Patient's lungs reassessed and she was moving air much better and only had minimal wheezing on expiration. Her vital signs improved with oxygen saturation maintaining consistently at 98-100% on room air. Patient's heart rate is elevated but I suspect this could be do to nebulize treatments. Think at this time  patient is stable for discharge home. She was instructed to start taking allergy medications daily. She was given an inhaler to and a prescription for refill. She was also given a short taper of prednisone to take. Patient instructed to followup closely with her primary care doctor.  Filed Vitals:   09/19/12 1934 09/19/12 2105 09/19/12 2110 09/19/12 2155  BP: 109/68     Pulse: 105 106  113  Temp: 98.2 F (36.8 C)     TempSrc: Oral     Resp: 18     SpO2: 100% 98% 98% 100%   I personally performed the services described in this documentation, which was scribed in my presence. The recorded information has been reviewed and is accurate.    Lottie Mussel, PA-C 09/19/12 2256

## 2012-09-19 NOTE — ED Notes (Signed)
Pt reports having asthma, is out of inhaler. Started getting cold symptoms two days ago and now increase in sob. Airway intact at triage, speaking in full sentences.

## 2012-09-19 NOTE — ED Notes (Signed)
Notified respiratory therapy for continuous neb treatment

## 2012-09-21 ENCOUNTER — Observation Stay (HOSPITAL_COMMUNITY)
Admission: EM | Admit: 2012-09-21 | Discharge: 2012-09-22 | Disposition: A | Discharge status: Home / Self Care | Payer: Medicaid Other | Attending: Family Medicine | Admitting: Family Medicine

## 2012-09-21 ENCOUNTER — Encounter (HOSPITAL_COMMUNITY): Payer: Self-pay | Admitting: Emergency Medicine

## 2012-09-21 ENCOUNTER — Emergency Department (HOSPITAL_COMMUNITY): Payer: Medicaid Other

## 2012-09-21 DIAGNOSIS — Z23 Encounter for immunization: Secondary | ICD-10-CM | POA: Insufficient documentation

## 2012-09-21 DIAGNOSIS — J45909 Unspecified asthma, uncomplicated: Secondary | ICD-10-CM

## 2012-09-21 DIAGNOSIS — J189 Pneumonia, unspecified organism: Secondary | ICD-10-CM | POA: Insufficient documentation

## 2012-09-21 DIAGNOSIS — N61 Mastitis without abscess: Secondary | ICD-10-CM | POA: Diagnosis present

## 2012-09-21 DIAGNOSIS — O91219 Nonpurulent mastitis associated with pregnancy, unspecified trimester: Secondary | ICD-10-CM | POA: Insufficient documentation

## 2012-09-21 DIAGNOSIS — Z3201 Encounter for pregnancy test, result positive: Secondary | ICD-10-CM

## 2012-09-21 DIAGNOSIS — J45901 Unspecified asthma with (acute) exacerbation: Secondary | ICD-10-CM

## 2012-09-21 DIAGNOSIS — Z79899 Other long term (current) drug therapy: Secondary | ICD-10-CM | POA: Insufficient documentation

## 2012-09-21 DIAGNOSIS — O99891 Other specified diseases and conditions complicating pregnancy: Principal | ICD-10-CM | POA: Insufficient documentation

## 2012-09-21 DIAGNOSIS — Z3490 Encounter for supervision of normal pregnancy, unspecified, unspecified trimester: Secondary | ICD-10-CM

## 2012-09-21 DIAGNOSIS — F329 Major depressive disorder, single episode, unspecified: Secondary | ICD-10-CM

## 2012-09-21 LAB — URINALYSIS, ROUTINE W REFLEX MICROSCOPIC
Glucose, UA: NEGATIVE mg/dL
Hgb urine dipstick: NEGATIVE
Specific Gravity, Urine: 1.02 (ref 1.005–1.030)
pH: 7.5 (ref 5.0–8.0)

## 2012-09-21 LAB — BASIC METABOLIC PANEL
CO2: 25 mEq/L (ref 19–32)
Glucose, Bld: 112 mg/dL — ABNORMAL HIGH (ref 70–99)
Potassium: 4.1 mEq/L (ref 3.5–5.1)
Sodium: 139 mEq/L (ref 135–145)

## 2012-09-21 LAB — CBC
HCT: 46.1 % — ABNORMAL HIGH (ref 36.0–46.0)
Hemoglobin: 16.4 g/dL — ABNORMAL HIGH (ref 12.0–15.0)
RBC: 4.73 MIL/uL (ref 3.87–5.11)

## 2012-09-21 LAB — POCT PREGNANCY, URINE: Preg Test, Ur: POSITIVE — AB

## 2012-09-21 MED ORDER — ALBUTEROL SULFATE (5 MG/ML) 0.5% IN NEBU
5.0000 mg | INHALATION_SOLUTION | Freq: Once | RESPIRATORY_TRACT | Status: AC
  Administered 2012-09-21: 5 mg via RESPIRATORY_TRACT
  Filled 2012-09-21: qty 1

## 2012-09-21 MED ORDER — DEXTROSE 5 % IV SOLN
500.0000 mg | Freq: Once | INTRAVENOUS | Status: AC
  Administered 2012-09-21: 500 mg via INTRAVENOUS
  Filled 2012-09-21: qty 500

## 2012-09-21 MED ORDER — IPRATROPIUM BROMIDE 0.02 % IN SOLN
0.5000 mg | Freq: Once | RESPIRATORY_TRACT | Status: AC
  Administered 2012-09-21: 0.5 mg via RESPIRATORY_TRACT
  Filled 2012-09-21: qty 2.5

## 2012-09-21 MED ORDER — DEXTROSE 5 % IV SOLN
1.0000 g | Freq: Once | INTRAVENOUS | Status: AC
  Administered 2012-09-21: 1 g via INTRAVENOUS
  Filled 2012-09-21: qty 10

## 2012-09-21 MED ORDER — ALBUTEROL (5 MG/ML) CONTINUOUS INHALATION SOLN
10.0000 mg/h | INHALATION_SOLUTION | Freq: Once | RESPIRATORY_TRACT | Status: AC
  Administered 2012-09-21: 10 mg/h via RESPIRATORY_TRACT
  Filled 2012-09-21: qty 20

## 2012-09-21 MED ORDER — METHYLPREDNISOLONE SODIUM SUCC 125 MG IJ SOLR
125.0000 mg | Freq: Once | INTRAMUSCULAR | Status: AC
  Administered 2012-09-21: 125 mg via INTRAVENOUS
  Filled 2012-09-21: qty 2

## 2012-09-21 NOTE — ED Notes (Signed)
Oximetry while ambulating: 94% lowest, 96% highest. Two laps around Pod A completed. 93% at rest after ambulating.

## 2012-09-21 NOTE — ED Provider Notes (Signed)
CSN: [id number]     Arrival date & time 09/21/12  1234 History   First MD Initiated Contact with Patient 09/21/12 1340     Chief Complaint  Patient presents with  . Shortness of Breath   (Consider location/radiation/quality/duration/timing/severity/associated sxs/prior Treatment) The history is provided by the patient and a parent. No language interpreter was used.  [name] [name] is a 28 y/o F with PMHx of asthma, pneumonia, depression presenting to the ED with asthma exacerbation. Patient reported that she was seen here in the ED on Thursday, was given nebulizer treatments and then discharged with steroids, albuterol inhaler, and cough medication that she could not afford. Patient reported that she has been having difficulty breathing and shortness of breath - reported that she has been wheezing continuously, reported that the wheezing has gotten worse. Patient reported that she has chest pain when she coughs. Reported that she shortness of breath increase when she does any particular activity. Reported that she has been coughing up sputum that is clear. Reported that she has been having nasal congestion. Patient reported that she has a nebulizer at home and has been using treatments throughout the day, patient stated that she has used a nebulizer treatment every 4 hours as needed and uses her inhaler inbetween. Denied fever, chills, nausea, vomiting, diarrhea, syncope, neck pain, back pain, urinary and BM changes, changes to appetite.  PCP Dr. Jeanie Cooks  Past Medical History  Diagnosis Date  . Asthma   . Pregnant state, incidental   . Breast abscess   . Family history of anesthesia complication     Grandmother has difficulty waskin up.  . Depression     HX of - not on med n, doing well.  . Pneumonia     years ago has had a few times.  . Eczema    Past Surgical History  Procedure Laterality Date  . No past surgeries    . Incise and drain abcess      left breast  . Incision and  drainage abscess Left 02/17/2012    Procedure:  DRAINAGEof recurrent breast abscess;  Surgeon: Shelly Rubenstein, MD;  Location: [facility] OR;  Service: General;  Laterality: Left;   Family History  Problem Relation Age of Onset  . Healthy Mother   . Healthy Father   . Stroke Maternal Grandmother   . Stroke Paternal Grandfather    History  Substance Use Topics  . Smoking status: Former Smoker    Quit date: 06/22/2010  . Smokeless tobacco: Never Used  . Alcohol Use: No   OB History   Grav Para Term Preterm Abortions TAB SAB Ect Mult Living   1 1  1      1      Review of Systems  Constitutional: Negative for fever and chills.  HENT: Positive for congestion. Negative for trouble swallowing, neck pain and neck stiffness.   Eyes: Negative for visual disturbance.  Respiratory: Positive for chest tightness, shortness of breath and wheezing.   Cardiovascular: Negative for chest pain.  Gastrointestinal: Negative for nausea, vomiting and abdominal pain.  Neurological: Positive for light-headedness. Negative for dizziness and weakness.  All other systems reviewed and are negative.    Allergies  Doxycycline  Home Medications   Current Outpatient Rx  Name  Route  Sig  Dispense  Refill  . albuterol (PROVENTIL HFA;VENTOLIN HFA) 108 (90 BASE) MCG/ACT inhaler   Inhalation   Inhale 2 puffs into the lungs every 6 (six) hours as needed for wheezing.         Marland Kitchen  albuterol (PROVENTIL HFA;VENTOLIN HFA) 108 (90 BASE) MCG/ACT inhaler   Inhalation   Inhale 1-2 puffs into the lungs every 6 (six) hours as needed for wheezing.   1 Inhaler   0   . benzonatate (TESSALON) 100 MG capsule   Oral   Take 1 capsule (100 mg total) by mouth every 8 (eight) hours.   21 capsule   0   . citalopram (CELEXA) 20 MG tablet   Oral   Take 20 mg by mouth daily.         . fluticasone (FLOVENT HFA) 220 MCG/ACT inhaler   Inhalation   Inhale 1 puff into the lungs 2 (two) times daily.         . predniSONE  (DELTASONE) 10 MG tablet      Take 5 tab day 1, take 4 tab day 2, take 3 tab day 3, take 2 tab day 4, and take 1 tab day 5   15 tablet   0    BP 123/52  Pulse 87  Temp(Src) 98.3 F (36.8 C) (Oral)  Resp 18  SpO2 94%  LMP 08/29/2012 Physical Exam  Nursing note and vitals reviewed. Constitutional: She is oriented to person, place, and time. She appears well-developed and well-nourished. No distress.  HENT:  Head: Normocephalic and atraumatic.  Eyes: Conjunctivae and EOM are normal. Pupils are equal, round, and reactive to light. Right eye exhibits no discharge. Left eye exhibits no discharge.  Neck: Normal range of motion. Neck supple.  Negative neck stiffness  Negative nuchal rigidity Negative pain upon palpation to the cervical spine Negative lypmhadenopathy  Cardiovascular: Normal rate, regular rhythm and normal heart sounds.  Exam reveals no friction rub.   No murmur heard. Pulses:      Radial pulses are 2+ on the right side, and 2+ on the left side.       Dorsalis pedis pulses are 2+ on the right side, and 2+ on the left side.  Cap refill < 3 seconds  Pulmonary/Chest: Effort normal. No respiratory distress. She has wheezes.  Inspiratory and expiratory wheezes noted to upper and lower lobes bilaterally. Negative rales or crackles auscultated.   Lymphadenopathy:    She has no cervical adenopathy.  Neurological: She is alert and oriented to person, place, and time. She exhibits normal muscle tone. Coordination normal.  Skin: Skin is warm and dry. No rash noted. She is not diaphoretic. No erythema.  Psychiatric: She has a normal mood and affect. Her behavior is normal. Thought content normal.    ED Course  Procedures (including critical care time)  This provider reviewed the patient's chart. Patient was recently seen in the ED on 09/19/2012 with similar complaint regarding nasal congestion, productive cough, and difficulty breathing. Patient was given multiple nebulizer  treatments of albuterol and ipratropium. Chest xray was performed with findings consistent with reactive airway disease. Patient was discharged with prednisone, albuterol inhaler and tessalon.   4:45 PM Patient reported that she is still having difficulty breathing, Inspiratory and expiratory wheezing noted. Continuous nebulizer treatment ordered.    Labs Review Labs Reviewed - No data to display Imaging Review Dg Chest 2 View  09/19/2012   CLINICAL DATA:  Upper respiratory tract infection. Asthma.  EXAM: CHEST  2 VIEW  COMPARISON:  CHEST x-ray 12/29/2011.  FINDINGS: Lung volumes are normal. No acute consolidative airspace disease. No pleural effusions. Mild diffuse peribronchial cuffing. No evidence of pulmonary edema. No suspicious appearing pulmonary nodules or masses are identified. Heart size is  normal. Mediastinal contours are unremarkable.  IMPRESSION: 1. Mild diffuse peribronchial cuffing, presumably related to the patient's history of reactive airway disease. No frank hyperexpansion at this time to strongly suggest an Asthma exacerbation.   Electronically Signed   By: Trudie Reed [name]   On: 09/19/2012 20:41    MDM  No diagnosis found.  Patient presenting to the ED with asthma exacerbation, reported that she was seen here on Thursday - 09/19/2012 - and was discharged with steroids, cough medication, and albuterol inhaler. Patient reported that the breathing has gotten worse over the past couple of days, stated that the shortness of breath is worse with activity.  Alert and oriented. Heart rate and rhythm normal. Pulses palpable and strong, distal and proximal. Cap refill < 3 seconds. Lungs with expiratory and inspiratory wheezes noted to upper and lower lobes. Negative chest pain upon palpation. Negative signs of respiratory distress.  CBC elevated with WBC of 20.1. BMP negative findings. UA negative findings. Urine pregnancy pending. Chest xray performed from 09/19/2012 noted mild diffuse  peribronchial cuffing related to reactive airway disease. Patient stable, afebrile. Negative sign of sepsis. Negative acute distress identified, negative use of accessory muscles. Discussed case with Charlestine Night, PA-C. Transfer of care to [employer], PA-C. Due to elevation in WBC and worsening of symptoms patient will most likely need a chest xray to be repeated. Patient placed on continuous nebulizer treatment.     Raymon Mutton, PA-C 09/21/12 1947  Raymon Mutton, PA-C 09/21/12 1950

## 2012-09-21 NOTE — H&P (Signed)
Family Medicine Teaching [facility] ([facility]) Admission History and Physical Service Pager: [phone]  Patient name: [name] [name] Medical record number: [id number] Date of birth: [birth year] Age: 27 y.o. Gender: female  Primary Care Provider: Carney Living, MD Consultants: None Code Status: Full  Chief Complaint: Wheezing and Inc WOB  Assessment and Plan: Jeraldine Primeau is a 27 y.o. female presenting with asthma exacerbation vs CAP. PMH is significant for Asthma, Mastitis, and Depression. Pregnancy test positive, patients previous unaware.   # Asthma exacerbation vs CAP - Asthma uncontrolled on flovent and albuterol with nightly symptoms. Severe intermittent. Previous exacerbations requiring hospitalizations, but NO intubations. Possible CAP: Afebrile; WBC 20.1 (on steroids x 2 days); CXR: possible early LUL PNA vs atelectasis. Will treat with Abx given failed outpatient therapy for asthma flare. Received Duonebs and Solumedrol in ED. Tachycardia in ED (likely due to Albuterol) - Will repeat CBC and BMP in am - Continue  flovent vs consider switching to Qvar add/or adding Singulair/claritin at @ d/c - discuss cost with patient, it appears QVAR is medicaid preferred unless she is already getting flovent affordably  - Albuterol 4 puffs q 4 w/ 4 puffs q 1 prn - Started Salmeterol 1 puff BID - Prednisone 40 mg x 5 days (last dose of steroids 9/24) - Azithro started 9/20 (last dose 9/24); CTX 1g given in ED - Will give Flu vaccination - Oxygen to maintain stats > 92% - Pt should go home with spacer and encourage its use  # Positive Preg test - Patient unaware of pregnancy on admission, and reports false positive test prior.  - Will obtain quantitative bHCG per pt preference - Have taken pregnancy status into account with current treatment - Will need to provide some basic prenatal counseling on discharge and advise patient to f/u with prenatal provider if pregnancy continuation is  desired - did not discuss on admission. # Chronic conditions Depressoin: Celexa 20mg  qd  # Mastitis - Currently afebrile, no drainage, patient reports [place] surgery is managing but she thinks another 'flare' may be developing. - Will re-evaluate breast tomorrow to see if needs further management.  FEN/GI:  Diet: reg Saline Lock Prophylaxis: Lovenox  Disposition: Admit to floor, Attending [place], Discharge home pending improved respiratory status  History of Present Illness: [name] [name] is a 27 y.o. female presenting with increased shortness of breath, wheezing, and cough. Her PMH includes Asthma, Mastitis, and Depression. She had just been treated in the ED 2 days prior for the same symptoms was treated with nebulizer and then discharged with steroids taper and albuterol inhaler. Since returning home she has required her Albuterol every 2-3 hours (often w/o spacer), and her symptoms have not improved and is experiencing daily and nightly wheezing. She reports that this began with URI / allergies (sneezing nasal congestion), that her asthma is often worse during this season, and sick contact (her 41 month old daughter with runny nose). She has been hospitalized previously (last dec) and every couple of years from childhood, but no intubations. Reports compliance with home meds Flovent BID and albuterol; she is not currently on antihistamine or Singular but has taken them in the past. She denies: fevers, muscle aches, rashes. She also reports recurrent Mastitis (Not on antibiotics).   ED Course: Albuterol Neb 15mg  total; Atrovent Neb; Solumedrol 125mg ; Azithro 500mg  & CTX 1g  Review Of Systems: Per HPI with the following additions:  Otherwise 12 point review of systems was performed and was unremarkable.  Patient Active Problem List  Diagnosis Date Noted  . Contraception 06/11/2012  . Mastitis, Left. 11/27/2011  . Depression 03/24/2011  . ASTHMA, PERSISTENT 03/01/2006  .  ECZEMA, ATOPIC DERMATITIS 03/01/2006   Past Medical History: Past Medical History  Diagnosis Date  . Asthma   . Pregnant state, incidental   . Breast abscess   . Family history of anesthesia complication     Grandmother has difficulty waskin up.  . Depression     HX of - not on med n, doing well.  . Pneumonia     years ago has had a few times.  . Eczema    Past Surgical History: Past Surgical History  Procedure Laterality Date  . No past surgeries    . Incise and drain abcess      left breast  . Incision and drainage abscess Left 02/17/2012    Procedure:  DRAINAGEof recurrent breast abscess;  Surgeon: Shelly Rubenstein, MD;  Location: [facility] OR;  Service: General;  Laterality: Left;   Social History: History  Substance Use Topics  . Smoking status: Former Smoker    Quit date: 06/22/2010  . Smokeless tobacco: Never Used  . Alcohol Use: No   Additional social history: Quit smoking 20 months ago, but husband smokes (outside)  Please also refer to relevant sections of EMR.  Family History: Family History  Problem Relation Age of Onset  . Healthy Mother   . Healthy Father   . Stroke Maternal Grandmother   . Stroke Paternal Grandfather    Allergies and Medications: Allergies  Allergen Reactions  . Doxycycline Other (See Comments)    Photo sensitivity   No current facility-administered medications on file prior to encounter.   Current Outpatient Prescriptions on File Prior to Encounter  Medication Sig Dispense Refill  . albuterol (PROVENTIL HFA;VENTOLIN HFA) 108 (90 BASE) MCG/ACT inhaler Inhale 2 puffs into the lungs every 6 (six) hours as needed for wheezing.      . citalopram (CELEXA) 20 MG tablet Take 20 mg by mouth daily.      . fluticasone (FLOVENT HFA) 220 MCG/ACT inhaler Inhale 1 puff into the lungs 2 (two) times daily.      . predniSONE (DELTASONE) 10 MG tablet Take 5 tab day 1, take 4 tab day 2, take 3 tab day 3, take 2 tab day 4, and take 1 tab day 5  15 tablet   0    Objective: BP 122/66  Pulse 93  Temp(Src) 98.2 F (36.8 C) (Oral)  Resp 17  Ht 5' (1.524 m)  Wt 156 lb 15.5 oz (71.2 kg)  BMI 30.66 kg/m2  SpO2 96%  LMP 08/29/2012 Exam: Gen: WD/WN female in NAD; Dry coughing during exam Neuro: A&Ox4, no focal deficits, normal speech Eyes:Sclera white; Conjunctiva pink; PERRLA; EOMI;  Throat: Oral mucosa pink, Pharynx w/o exudates, MMM CV: RRR, No m/r/g Lungs: diffuse wheezing b/l; normal raspatory effort with mild increase when patient ends a sentence  Labs and Imaging: Results for orders placed during the hospital encounter of 09/21/12 (from the past 24 hour(s))  CBC     Status: Abnormal   Collection Time    09/21/12  2:51 PM      Result Value Range   WBC 20.1 (*) 4.0 - 10.5 K/uL   RBC 4.73  3.87 - 5.11 MIL/uL   Hemoglobin 16.4 (*) 12.0 - 15.0 g/dL   HCT 16.1 (*) 09.6 - 04.5 %   MCV 97.5  78.0 - 100.0 fL   MCH 34.7 (*) 26.0 -  34.0 pg   MCHC 35.6  30.0 - 36.0 g/dL   RDW 16.1  09.6 - 04.5 %   Platelets 407 (*) 150 - 400 K/uL  BASIC METABOLIC PANEL     Status: Abnormal   Collection Time    09/21/12  2:51 PM      Result Value Range   Sodium 139  135 - 145 mEq/L   Potassium 4.1  3.5 - 5.1 mEq/L   Chloride 102  96 - 112 mEq/L   CO2 25  19 - 32 mEq/L   Glucose, Bld 112 (*) 70 - 99 mg/dL   BUN 9  6 - 23 mg/dL   Creatinine, Ser 4.09  0.50 - 1.10 mg/dL   Calcium 9.3  8.4 - 81.1 mg/dL   GFR calc non Af Amer >90  >90 mL/min   GFR calc Af Amer >90  >90 mL/min  URINALYSIS, ROUTINE W REFLEX MICROSCOPIC     Status: None   Collection Time    09/21/12  3:50 PM      Result Value Range   Color, Urine YELLOW  YELLOW   APPearance CLEAR  CLEAR   Specific Gravity, Urine 1.020  1.005 - 1.030   pH 7.5  5.0 - 8.0   Glucose, UA NEGATIVE  NEGATIVE mg/dL   Hgb urine dipstick NEGATIVE  NEGATIVE   Bilirubin Urine NEGATIVE  NEGATIVE   Ketones, ur NEGATIVE  NEGATIVE mg/dL   Protein, ur NEGATIVE  NEGATIVE mg/dL   Urobilinogen, UA 0.2  0.0 - 1.0  mg/dL   Nitrite NEGATIVE  NEGATIVE   Leukocytes, UA NEGATIVE  NEGATIVE  POCT PREGNANCY, URINE     Status: Abnormal   Collection Time    09/21/12  8:30 PM      Result Value Range   Preg Test, Ur POSITIVE (*) NEGATIVE   CXR IMPRESSION:  New left upper lobe atelectasis. This finding may represent earliest  changes of pneumonia. Lungs otherwise clear.  Wenda Low, MD 09/21/2012, 11:11 PM PGY-1, [facility] Family Medicine FPTS Intern pager: [phone], text pages welcome   I have seen and examined patient with PGY-1 and agree with assessment and plan, with my additions in pink. Leona Singleton, MD PGY-2 09/22/2012 12:00 PM

## 2012-09-21 NOTE — ED Notes (Signed)
Pt. Stated I have asthma.  I was here on Thursday and given a breathing treatment, some Prednisone but it still comes back.  I have a bad cough so much it makes my chest hurt.

## 2012-09-22 ENCOUNTER — Encounter (HOSPITAL_COMMUNITY): Payer: Self-pay | Admitting: *Deleted

## 2012-09-22 DIAGNOSIS — F329 Major depressive disorder, single episode, unspecified: Secondary | ICD-10-CM

## 2012-09-22 DIAGNOSIS — J45909 Unspecified asthma, uncomplicated: Secondary | ICD-10-CM

## 2012-09-22 DIAGNOSIS — J45901 Unspecified asthma with (acute) exacerbation: Secondary | ICD-10-CM | POA: Diagnosis present

## 2012-09-22 DIAGNOSIS — Z3490 Encounter for supervision of normal pregnancy, unspecified, unspecified trimester: Secondary | ICD-10-CM

## 2012-09-22 DIAGNOSIS — J189 Pneumonia, unspecified organism: Secondary | ICD-10-CM | POA: Diagnosis present

## 2012-09-22 DIAGNOSIS — Z3201 Encounter for pregnancy test, result positive: Secondary | ICD-10-CM

## 2012-09-22 DIAGNOSIS — N61 Mastitis without abscess: Secondary | ICD-10-CM

## 2012-09-22 LAB — COMPREHENSIVE METABOLIC PANEL
ALT: 7 U/L (ref 0–35)
Alkaline Phosphatase: 73 U/L (ref 39–117)
BUN: 10 mg/dL (ref 6–23)
CO2: 21 mEq/L (ref 19–32)
Chloride: 99 mEq/L (ref 96–112)
Creatinine, Ser: 0.64 mg/dL (ref 0.50–1.10)
GFR calc Af Amer: >90 mL/min (ref >90)
Glucose, Bld: 108 mg/dL — ABNORMAL HIGH (ref 70–99)
Potassium: 4.1 mEq/L (ref 3.5–5.1)
Sodium: 136 mEq/L (ref 135–145)
Total Bilirubin: 0.1 mg/dL — ABNORMAL LOW (ref 0.3–1.2)
Total Protein: 6.9 g/dL (ref 6.0–8.3)

## 2012-09-22 LAB — HCG, QUANTITATIVE, PREGNANCY: hCG, Beta Chain, Quant, S: 1085 m[IU]/mL — ABNORMAL HIGH (ref <5)

## 2012-09-22 MED ORDER — ALBUTEROL SULFATE HFA 108 (90 BASE) MCG/ACT IN AERS: 4.0000 | INHALATION_SPRAY | RESPIRATORY_TRACT | Status: DC | PRN

## 2012-09-22 MED ORDER — FLUTICASONE PROPIONATE HFA 220 MCG/ACT IN AERO: 2.0000 | INHALATION_SPRAY | Freq: Two times a day (BID) | RESPIRATORY_TRACT | Status: DC

## 2012-09-22 MED ORDER — ALBUTEROL SULFATE HFA 108 (90 BASE) MCG/ACT IN AERS
4.0000 | INHALATION_SPRAY | RESPIRATORY_TRACT | Status: DC | PRN
  Administered 2012-09-22: 4 via RESPIRATORY_TRACT

## 2012-09-22 MED ORDER — AZITHROMYCIN 250 MG PO TABS
250.0000 mg | ORAL_TABLET | Freq: Every day | ORAL | Status: DC
  Administered 2012-09-22: 250 mg via ORAL
  Filled 2012-09-22: qty 1

## 2012-09-22 MED ORDER — ALBUTEROL SULFATE HFA 108 (90 BASE) MCG/ACT IN AERS
4.0000 | INHALATION_SPRAY | RESPIRATORY_TRACT | Status: DC
  Administered 2012-09-22 (×4): 4 via RESPIRATORY_TRACT
  Filled 2012-09-22: qty 6.7

## 2012-09-22 MED ORDER — SALMETEROL XINAFOATE 50 MCG/DOSE IN AEPB
1.0000 | INHALATION_SPRAY | Freq: Two times a day (BID) | RESPIRATORY_TRACT | Status: DC
  Administered 2012-09-22 (×2): 1 via RESPIRATORY_TRACT
  Filled 2012-09-22: qty 0

## 2012-09-22 MED ORDER — FLUTICASONE PROPIONATE HFA 220 MCG/ACT IN AERO
2.0000 | INHALATION_SPRAY | Freq: Two times a day (BID) | RESPIRATORY_TRACT | Status: DC
  Filled 2012-09-22: qty 12

## 2012-09-22 MED ORDER — CITALOPRAM HYDROBROMIDE 20 MG PO TABS
20.0000 mg | ORAL_TABLET | Freq: Every day | ORAL | Status: DC
  Administered 2012-09-22: 20 mg via ORAL
  Filled 2012-09-22: qty 1

## 2012-09-22 MED ORDER — INFLUENZA VAC SPLIT QUAD 0.5 ML IM SUSP
0.5000 mL | INTRAMUSCULAR | Status: AC
  Administered 2012-09-22: 0.5 mL via INTRAMUSCULAR
  Filled 2012-09-22: qty 0.5

## 2012-09-22 MED ORDER — PREDNISONE 20 MG PO TABS
40.0000 mg | ORAL_TABLET | Freq: Every day | ORAL | Status: DC
  Administered 2012-09-22: 40 mg via ORAL
  Filled 2012-09-22 (×2): qty 2

## 2012-09-22 MED ORDER — FLUTICASONE PROPIONATE HFA 220 MCG/ACT IN AERO
1.0000 | INHALATION_SPRAY | Freq: Two times a day (BID) | RESPIRATORY_TRACT | Status: DC
  Administered 2012-09-22 (×2): 1 via RESPIRATORY_TRACT
  Filled 2012-09-22: qty 12

## 2012-09-22 MED ORDER — BECLOMETHASONE DIPROPIONATE 80 MCG/ACT IN AERS
1.0000 | INHALATION_SPRAY | Freq: Two times a day (BID) | RESPIRATORY_TRACT | Status: DC
  Filled 2012-09-22: qty 8.7

## 2012-09-22 MED ORDER — ACETAMINOPHEN 325 MG PO TABS: 325.0000 mg | ORAL_TABLET | Freq: Four times a day (QID) | ORAL | Status: DC | PRN

## 2012-09-22 MED ORDER — ENOXAPARIN SODIUM 40 MG/0.4ML ~~LOC~~ SOLN
40.0000 mg | SUBCUTANEOUS | Status: DC
  Administered 2012-09-22: 40 mg via SUBCUTANEOUS
  Filled 2012-09-22 (×2): qty 0.4

## 2012-09-22 MED ORDER — AZITHROMYCIN 250 MG PO TABS: ORAL_TABLET | ORAL | Status: DC

## 2012-09-22 NOTE — Progress Notes (Signed)
FMTS Attending Note  I personally saw and evaluated the patient. The plan of care was discussed with the resident team. I agree with the assessment and plan as documented by the resident.   Lazaro Isenhower MD 

## 2012-09-22 NOTE — Progress Notes (Signed)
New Admission Note:   Arrival Method: Via stretcher from ED  Mental Orientation: A+Ox4 Telemetry: N/A Assessment: Completed  Skin: Intact with many tattoos  IV: IV infusing with ABT from ED. NSL once complete  Pain: No complaints  Tubes: None Safety Measures: Discussed Fall Risk Safety Plan  Admission: Completed  [address] Orientation: Oriented to the room, unit and the staff.  Family: No family at the bedside   Orders have been reviewed and implemented. Will continue to monitor the patient. Call light has been placed within reach and bed alarm has been activated.   Patient had a positive pregnancy test and was unaware. Patient was advised of her results and became very upset. MD notified at that time and pharmacy to make sure that ABT were still safe for the fetus. Patient eventually became calm and requested a blood test to see how far along she was. MD ordered.   Doristine Devoid, RN  Phone number: [phone]

## 2012-09-22 NOTE — Progress Notes (Signed)
Benson Norway to be D/C'd Home per MD order.  Discussed prescriptions and follow up appointments with the patient. Prescriptions and medications given to patient, medication list explained in detail. Pt verbalized understanding.    Medication List         acetaminophen 500 MG tablet  Commonly known as:  TYLENOL  Take 1,000 mg by mouth every 6 (six) hours as needed for pain.     albuterol 108 (90 BASE) MCG/ACT inhaler  Commonly known as:  PROVENTIL HFA;VENTOLIN HFA  Inhale 2 puffs into the lungs every 6 (six) hours as needed for wheezing.     azithromycin 250 MG tablet  Commonly known as:  ZITHROMAX  Take taper pack as directed     citalopram 20 MG tablet  Commonly known as:  CELEXA  Take 20 mg by mouth daily.     fluticasone 220 MCG/ACT inhaler  Commonly known as:  FLOVENT HFA  Inhale 2 puffs into the lungs 2 (two) times daily.     ibuprofen 200 MG tablet  Commonly known as:  ADVIL,MOTRIN  Take 400 mg by mouth every 6 (six) hours as needed for pain.     predniSONE 10 MG tablet  Commonly known as:  DELTASONE  Take 5 tab day 1, take 4 tab day 2, take 3 tab day 3, take 2 tab day 4, and take 1 tab day 5        Filed Vitals:   09/22/12 1700  BP: 100/55  Pulse: 87  Temp: 97.2 F (36.2 C)  Resp: 18    Skin clean, dry and intact without evidence of skin break down, no evidence of skin tears noted. IV catheter discontinued intact. Site without signs and symptoms of complications. Dressing and pressure applied. Pt denies pain at this time. No complaints noted. Given Flu vaccine before discharge and paperwork. Pt verbalized understanding. Discussed MyChart with the patient as well and how to access.   An After Visit Summary was printed and given to the patient. Patient escorted via WC, and D/C home via private auto.  HILL, Jermine Bibbee 09/22/2012 8:59 PM

## 2012-09-22 NOTE — Progress Notes (Signed)
Family Medicine Teaching Service Daily Progress Note Intern Pager: [phone]  Patient name: [name] [name] Medical record number: [id number] Date of birth: 1985-05-15 Age: 27 y.o. Gender: female  Primary Care Provider: Carney Living, MD Consultants: None Code Status: Full  Pt Overview and Major Events to Date:   9/20: ED Course: Albuterol Neb 15mg  total; Atrovent Neb; Solumedrol 125mg ; Azithro 500mg  & CTX 1g  Assessment and Plan: [name] [name] is a 27 y.o. female presenting with asthma exacerbation vs CAP. PMH is significant for Asthma, Mastitis, and Depression. Pregnancy test positive, patients previous unaware.   # Asthma exacerbation vs CAP  - Asthma uncontrolled on flovent and albuterol with nightly symptoms. Previous exacerbations requiring hospitalizations, but NO intubations. Possible CAP: Afebrile; WBC 20.1 (on steroids x 2 days); CXR: possible early LUL PNA vs atelectasis. Will treat with Abx given failed outpatient therapy for asthma flare. Received Duonebs and Solumedrol in ED. Tachycardia in ED (likely due to Albuterol)  - BMP 9/21 wnl  - Flovent increased to 2 puffs BID; consider adding Singularat @ d/c  - Albuterol 4 puffs q 4 w/ 4 puffs q 2 prn; Instructed spacer use with each treatment - Continue Salmeterol 1 puff BID  - Prednisone 40 mg- day 2/5 (last dose 9/24)  - Azithro day 2/5 (last dose 9/24); CTX 1g given in ED  - Will give Flu vaccination  - Oxygen to maintain stats > 92%   # Positive Preg test  - Patient unaware of pregnancy on admission, and reports false positive test prior.  - Quantitative bHCG 1085 - Have taken pregnancy status into account with current treatment   # Chronic conditions  Depressoin: Celexa 20mg  qd   FEN/GI:  Diet: reg  Saline Lock  Prophylaxis: Lovenox   Disposition: Discharge home pending improved respiratory status  Subjective: She reports WOB slightly improved. Continues to cough which kept her up last eat.    Objective: Temp:  [98.2 F (36.8 C)-98.3 F (36.8 C)] 98.2 F (36.8 C) (09/20 2133) Pulse Rate:  [76-125] 93 (09/20 2133) Resp:  [16-20] 17 (09/20 2133) BP: (104-128)/(50-80) 122/66 mmHg (09/20 2133) SpO2:  [92 %-99 %] 96 % (09/20 2133) Weight:  [156 lb 15.5 oz (71.2 kg)] 156 lb 15.5 oz (71.2 kg) (09/20 2133)  Physical Exam: Gen: WD/WN female in NAD Neuro: A&Ox4  Heent: MMM, Nasal congestion CV: RRR, No m/r/g  Lungs: diffuse inspiratory & expiratory wheezing b/l; normal raspatory effort  Laboratory: Results for orders placed during the hospital encounter of 09/21/12 (from the past 24 hour(s))  CBC     Status: Abnormal   Collection Time    09/21/12  2:51 PM      Result Value Range   WBC 20.1 (*) 4.0 - 10.5 K/uL   RBC 4.73  3.87 - 5.11 MIL/uL   Hemoglobin 16.4 (*) 12.0 - 15.0 g/dL   HCT 69.6 (*) 29.5 - 28.4 %   MCV 97.5  78.0 - 100.0 fL   MCH 34.7 (*) 26.0 - 34.0 pg   MCHC 35.6  30.0 - 36.0 g/dL   RDW 13.2  44.0 - 10.2 %   Platelets 407 (*) 150 - 400 K/uL  BASIC METABOLIC PANEL     Status: Abnormal   Collection Time    09/21/12  2:51 PM      Result Value Range   Sodium 139  135 - 145 mEq/L   Potassium 4.1  3.5 - 5.1 mEq/L   Chloride 102  96 - 112 mEq/L  CO2 25  19 - 32 mEq/L   Glucose, Bld 112 (*) 70 - 99 mg/dL   BUN 9  6 - 23 mg/dL   Creatinine, Ser 1.61  0.50 - 1.10 mg/dL   Calcium 9.3  8.4 - 09.6 mg/dL   GFR calc non Af Amer >90  >90 mL/min   GFR calc Af Amer >90  >90 mL/min  URINALYSIS, ROUTINE W REFLEX MICROSCOPIC     Status: None   Collection Time    09/21/12  3:50 PM      Result Value Range   Color, Urine YELLOW  YELLOW   APPearance CLEAR  CLEAR   Specific Gravity, Urine 1.020  1.005 - 1.030   pH 7.5  5.0 - 8.0   Glucose, UA NEGATIVE  NEGATIVE mg/dL   Hgb urine dipstick NEGATIVE  NEGATIVE   Bilirubin Urine NEGATIVE  NEGATIVE   Ketones, ur NEGATIVE  NEGATIVE mg/dL   Protein, ur NEGATIVE  NEGATIVE mg/dL   Urobilinogen, UA 0.2  0.0 - 1.0 mg/dL    Nitrite NEGATIVE  NEGATIVE   Leukocytes, UA NEGATIVE  NEGATIVE  POCT PREGNANCY, URINE     Status: Abnormal   Collection Time    09/21/12  8:30 PM      Result Value Range   Preg Test, Ur POSITIVE (*) NEGATIVE  COMPREHENSIVE METABOLIC PANEL     Status: Abnormal   Collection Time    09/22/12  6:02 AM      Result Value Range   Sodium 136  135 - 145 mEq/L   Potassium 4.1  3.5 - 5.1 mEq/L   Chloride 99  96 - 112 mEq/L   CO2 21  19 - 32 mEq/L   Glucose, Bld 108 (*) 70 - 99 mg/dL   BUN 10  6 - 23 mg/dL   Creatinine, Ser 0.45  0.50 - 1.10 mg/dL   Calcium 9.4  8.4 - 40.9 mg/dL   Total Protein 6.9  6.0 - 8.3 g/dL   Albumin 3.7  3.5 - 5.2 g/dL   AST 16  0 - 37 U/L   ALT 7  0 - 35 U/L   Alkaline Phosphatase 73  39 - 117 U/L   Total Bilirubin 0.1 (*) 0.3 - 1.2 mg/dL   GFR calc non Af Amer >90  >90 mL/min   GFR calc Af Amer >90  >90 mL/min  HCG, QUANTITATIVE, PREGNANCY     Status: Abnormal   Collection Time    09/22/12  6:02 AM      Result Value Range   hCG, Beta Chain, Quant, S 1085 (*) <5 mIU/mL   Imaging/Diagnostic Tests: CXR IMPRESSION:  New left upper lobe atelectasis. This finding may represent earliest  changes of pneumonia. Lungs otherwise clear.  Wenda Low, MD 09/22/2012, 4:40 AM PGY-1, [facility] Family Medicine FPTS Intern pager: [phone], text pages welcome

## 2012-09-22 NOTE — H&P (Signed)
FMTS Attending Note  I personally saw and evaluated the patient. The plan of care was discussed with the resident team. I agree with the assessment and plan as documented by the resident.   27 y/o female with PMH asthma presents with increased wob/cough/wheezing. Failed outpatient treatment with oral steroids.  Gen: alert, NAD, speaking in full sentences Cardiac: RRR, S1 and S2 present, no murmurs Resp: poor air entry bilaterally, diffuse wheezes Abd: soft, nontender  1. Asthma Exacerbation with ?CAP - clinically improving with steroids/breathing treatments/Azithromycin, agree with increased dose of Flovent/Qvar at discharge 2. Newly diagnosed pregnancy - patient to follow with PCP/Ob physician after discharge.  Anticipate discharge in next 24-48 hours if respiratory status continues to improve.  Donnella Sham MD

## 2012-09-23 ENCOUNTER — Telehealth: Payer: Self-pay | Admitting: Family Medicine

## 2012-09-23 DIAGNOSIS — Z3201 Encounter for pregnancy test, result positive: Secondary | ICD-10-CM

## 2012-09-23 NOTE — Telephone Encounter (Signed)
Pt called and would like a referral to [facility] on [address], she just found out she is pregnant. JW

## 2012-09-23 NOTE — Discharge Summary (Signed)
Family Medicine Teaching [facility] Discharge Summary  Patient name: [name] [name] Medical record number: [id number] Date of birth: Feb 17, 1985 Age: 27 y.o. Gender: female Date of Admission: 09/21/2012  Date of Discharge: 09/22/12 Admitting Physician: Uvaldo Rising, MD  Primary Care Provider: Carney Living, MD Consultants: None  Indication for Hospitalization: Community acquired pneumonia with Asthma exacerbation  Discharge Diagnoses/Problem List:  Asthma excerbation CAP Pregnant   Disposition: Home  Discharge Condition: Stable  Brief Hospital Course:  [name] is a 27 y.o. female who presented with asthma exacerbation vs CAP. PMH is significant for Asthma, Mastitis, and Depression. On admission urine pregnancy test was positive (patients previous unaware) and was confirmed with quantative beta HCG. She was treated wit Albuterol MDI, steroids, and Azithromycin. Her oxygen saturation remained > 92%. Her controller medication Flovent was increased to 2 puffs BID at discharge. Due to her nightly wheezing and asthma symptoms she made Flovent increased more or the addition on Singular  Issues for Follow Up:  1. Pregnancy hCG, Beta Chain, Quant, S 1085 (*)   2. Assess asthma symptoms with resolution of CAP and increase controller medications as needed 3. Continuing to use spacer with MDI? 4. Husbands smoking status as increased risk for exacerbation  Significant Procedures: None  Significant Labs and Imaging:   Recent Labs Lab 09/21/12 1451  WBC 20.1*  HGB 16.4*  HCT 46.1*  PLT 407*    Recent Labs Lab 09/21/12 1451 09/22/12 0602  NA 139 136  K 4.1 4.1  CL 102 99  CO2 25 21  GLUCOSE 112* 108*  BUN 9 10  CREATININE 0.64 0.64  CALCIUM 9.3 9.4  ALKPHOS  --  73  AST  --  16  ALT  --  7  ALBUMIN  --  3.7   hCG, Beta Chain, Quant, S 1085 (*)   Results/Tests Pending at Time of Discharge: None  Discharge Medications:    Medication List         acetaminophen 500 MG tablet  Commonly known as:  TYLENOL  Take 1,000 mg by mouth every 6 (six) hours as needed for pain.     albuterol 108 (90 BASE) MCG/ACT inhaler  Commonly known as:  PROVENTIL HFA;VENTOLIN HFA  Inhale 2 puffs into the lungs every 6 (six) hours as needed for wheezing.     azithromycin 250 MG tablet  Commonly known as:  ZITHROMAX  Take taper pack as directed     citalopram 20 MG tablet  Commonly known as:  CELEXA  Take 20 mg by mouth daily.     fluticasone 220 MCG/ACT inhaler  Commonly known as:  FLOVENT HFA  Inhale 2 puffs into the lungs 2 (two) times daily.     ibuprofen 200 MG tablet  Commonly known as:  ADVIL,MOTRIN  Take 400 mg by mouth every 6 (six) hours as needed for pain.     predniSONE 10 MG tablet  Commonly known as:  DELTASONE  Take 5 tab day 1, take 4 tab day 2, take 3 tab day 3, take 2 tab day 4, and take 1 tab day 5       Discharge Instructions: Please refer to Patient Instructions section of EMR for full details.  Patient was counseled important signs and symptoms that should prompt return to medical care, changes in medications, dietary instructions, activity restrictions, and follow up appointments.   Follow-Up Appointments:     Follow-up Information   Follow up with Carney Living, MD. Schedule an appointment as  soon as possible for a visit in 1 week.   Specialty:  Family Medicine   Contact information:   [address] [place] [address] [phone]      Wenda Low, MD 09/23/2012, 4:34 PM PGY-1, [facility] Family Medicine

## 2012-09-24 NOTE — Discharge Summary (Signed)
I agree with the discharge summary as documented.   Rowdy Guerrini MD  

## 2012-09-25 NOTE — ED Provider Notes (Signed)
Medical screening examination/treatment/procedure(s) were performed by non-physician practitioner and as supervising physician I was immediately available for consultation/collaboration.   Roney Marion, MD 09/25/12 [phone]

## 2012-09-26 ENCOUNTER — Telehealth: Payer: Self-pay | Admitting: Family Medicine

## 2012-09-26 NOTE — Telephone Encounter (Signed)
I put in the order.  I am not sure she really needs it but its there Thanks LC

## 2012-09-26 NOTE — Telephone Encounter (Signed)
Called to check on her. Feeling ok Told to take flovent 2 p twice daily  She is making a follow up appointment with me and with OB

## 2012-09-26 NOTE — Telephone Encounter (Signed)
Related message,pt voiced understanding. [name] [name]  

## 2012-09-26 NOTE — Telephone Encounter (Signed)
LMOVM for pt to call back.  Please inform her that we have faxed over notes to [facility] and they will contact her after thry have been reviewed. Lance Huaracha, [name]

## 2012-10-14 ENCOUNTER — Ambulatory Visit (INDEPENDENT_AMBULATORY_CARE_PROVIDER_SITE_OTHER): Payer: Medicaid Other | Admitting: Family Medicine

## 2012-10-14 ENCOUNTER — Encounter: Payer: Self-pay | Admitting: Family Medicine

## 2012-10-14 VITALS — BP 133/82 | Temp 98.7°F | Wt 161.0 lb

## 2012-10-14 DIAGNOSIS — H1013 Acute atopic conjunctivitis, bilateral: Secondary | ICD-10-CM | POA: Insufficient documentation

## 2012-10-14 DIAGNOSIS — J309 Allergic rhinitis, unspecified: Secondary | ICD-10-CM

## 2012-10-14 DIAGNOSIS — Z3490 Encounter for supervision of normal pregnancy, unspecified, unspecified trimester: Secondary | ICD-10-CM

## 2012-10-14 DIAGNOSIS — Z331 Pregnant state, incidental: Secondary | ICD-10-CM

## 2012-10-14 DIAGNOSIS — H1010 Acute atopic conjunctivitis, unspecified eye: Secondary | ICD-10-CM

## 2012-10-14 DIAGNOSIS — J45909 Unspecified asthma, uncomplicated: Secondary | ICD-10-CM

## 2012-10-14 DIAGNOSIS — H109 Unspecified conjunctivitis: Secondary | ICD-10-CM | POA: Insufficient documentation

## 2012-10-14 MED ORDER — SALINE SPRAY 0.65 % NA SOLN: 1.0000 | NASAL | Status: DC | PRN

## 2012-10-14 MED ORDER — MONTELUKAST SODIUM 10 MG PO TABS: 10.0000 mg | ORAL_TABLET | Freq: Every day | ORAL | Status: DC

## 2012-10-14 MED ORDER — PRENATAL VITAMINS 28-0.8 MG PO TABS: 1.0000 | ORAL_TABLET | Freq: Every day | ORAL | Status: DC

## 2012-10-14 MED ORDER — LORATADINE 10 MG PO TABS: 10.0000 mg | ORAL_TABLET | Freq: Every day | ORAL | Status: DC

## 2012-10-14 MED ORDER — ALCAFTADINE 0.25 % OP SOLN: 1.0000 [drp] | Freq: Every day | OPHTHALMIC | Status: DC

## 2012-10-14 NOTE — Assessment & Plan Note (Signed)
Category B ocular histamine, lastacaft Nasal saline (nasal steroid his category C) Claritin

## 2012-10-14 NOTE — Assessment & Plan Note (Signed)
A: declined. P: Add Singulair.

## 2012-10-14 NOTE — Progress Notes (Signed)
S:  27 yo in a pregnant female was reportedly 7 weeks and 6 days pregnant (OB care at [facility])  presents for follow visit discuss the following:  #1 asthma: Patient with asthma such as a child. Has had to be hospitalized in the past. Has never been intubated. She reports nighttime wheezing and chest tightness. She denies daytime symptoms. She's compliant with her Flovent and albuterol. She is a nonsmoker. Her husband does smoke. Symptoms are worsening over the past 2 weeks. She took a few doses of old prednisone which improved his symptoms  #2 red eyes: Patient with gradual onset of itchy red and watery eyes for the past 2 weeks. She denies eye crusting.  She's not had any treatment. Symptoms and also some photophobia. She denies fever, chills, sick contacts. Additional symptoms include a chief and runny nose. Sneezing.  ROS: As per HPI   BP 133/82  Temp(Src) 98.7 F (37.1 C)  Wt 161 lb (73.029 kg)  BMI 31.44 kg/m2  LMP 08/29/2012 General appearance: alert, cooperative and no distress Eyes: positive findings: eyelids/periorbital: normal and conjunctiva: red and injected b/l.  Nose: Nares normal. Septum midline. Mucosa normal. No drainage or sinus tenderness. Throat: lips, mucosa, and tongue normal; teeth and gums normal Lungs: clear to auscultation bilaterally

## 2012-10-14 NOTE — Patient Instructions (Addendum)
Mrs. Gracelyn Nurse,  Thank you for coming in today.  I have added additional medications to your regimen to help control your asthma and allergic rhinitis/conjunctivitis. Please avoid any possible triggers or irritants. Cigarette smoke and residue, even second hand and on clothes is a major trigger for most people.   Schedule follow up with Dr. Deirdre Priest for two weeks from today for asthma/allergy follow up.   Dr. Armen Pickup

## 2012-10-15 ENCOUNTER — Telehealth: Payer: Self-pay | Admitting: Family Medicine

## 2012-10-15 NOTE — Telephone Encounter (Signed)
Eye drops prescribed yesterday are not covered by Medicaid, patient needs something else called in. Inform patient once complete.

## 2012-10-15 NOTE — Telephone Encounter (Signed)
Please inform patient Will be unable to call anything else in. Those drops were the only ones approved for pregnancy, category B.   Patient will have to use OTC artifical tears.

## 2012-10-15 NOTE — Telephone Encounter (Signed)
Are there any specific brands that you prefer her to try?  Or should she ask the pharmacist. Burnard Hawthorne

## 2012-10-15 NOTE — Telephone Encounter (Signed)
Will forward to MD. Jazmin Hartsell,CMA  

## 2012-10-15 NOTE — Telephone Encounter (Signed)
Patient would like to know if there is a specific kind of OTC that she needs to buy.

## 2012-10-15 NOTE — Telephone Encounter (Signed)
No specific brands, she can ask the pharmacist.

## 2012-10-16 NOTE — Telephone Encounter (Signed)
Pt is aware.  [name] [name],CMA  

## 2012-10-18 ENCOUNTER — Telehealth: Payer: Self-pay | Admitting: Family Medicine

## 2012-10-18 NOTE — Telephone Encounter (Signed)
Pt called because her eye's are getting worse. She can not go outside at all. She would like to know what she should do. JW

## 2012-10-18 NOTE — Telephone Encounter (Signed)
Please advise. Jazmin Hartsell,CMA  

## 2012-10-19 MED ORDER — FLUTICASONE PROPIONATE 50 MCG/ACT NA SUSP: NASAL | Status: DC

## 2012-10-19 NOTE — Telephone Encounter (Signed)
Continues to have itchy swollen irritated eyes that prevent her from driviing Unable to get eye drops Will try nasal steroids to see if will improve ocular symptoms To follow up next week via phone or visit

## 2012-10-22 ENCOUNTER — Telehealth: Payer: Self-pay

## 2012-10-22 ENCOUNTER — Inpatient Hospital Stay (HOSPITAL_COMMUNITY)
Admission: EM | Admit: 2012-10-22 | Discharge: 2012-10-25 | DRG: 781 | Disposition: A | Discharge status: Home / Self Care | Payer: Medicaid Other | Attending: Family Medicine | Admitting: Family Medicine

## 2012-10-22 ENCOUNTER — Encounter (HOSPITAL_COMMUNITY): Payer: Self-pay | Admitting: Emergency Medicine

## 2012-10-22 DIAGNOSIS — H109 Unspecified conjunctivitis: Secondary | ICD-10-CM | POA: Diagnosis present

## 2012-10-22 DIAGNOSIS — J45901 Unspecified asthma with (acute) exacerbation: Secondary | ICD-10-CM | POA: Diagnosis present

## 2012-10-22 DIAGNOSIS — D72829 Elevated white blood cell count, unspecified: Secondary | ICD-10-CM | POA: Diagnosis present

## 2012-10-22 DIAGNOSIS — T486X5A Adverse effect of antiasthmatics, initial encounter: Secondary | ICD-10-CM | POA: Diagnosis present

## 2012-10-22 DIAGNOSIS — L2089 Other atopic dermatitis: Secondary | ICD-10-CM | POA: Diagnosis present

## 2012-10-22 DIAGNOSIS — Z87891 Personal history of nicotine dependence: Secondary | ICD-10-CM

## 2012-10-22 DIAGNOSIS — R000 Tachycardia, unspecified: Secondary | ICD-10-CM | POA: Diagnosis present

## 2012-10-22 DIAGNOSIS — H1045 Other chronic allergic conjunctivitis: Secondary | ICD-10-CM | POA: Diagnosis present

## 2012-10-22 DIAGNOSIS — L2084 Intrinsic (allergic) eczema: Secondary | ICD-10-CM | POA: Diagnosis present

## 2012-10-22 DIAGNOSIS — Z3490 Encounter for supervision of normal pregnancy, unspecified, unspecified trimester: Secondary | ICD-10-CM

## 2012-10-22 DIAGNOSIS — F329 Major depressive disorder, single episode, unspecified: Secondary | ICD-10-CM

## 2012-10-22 DIAGNOSIS — R6510 Systemic inflammatory response syndrome (SIRS) of non-infectious origin without acute organ dysfunction: Secondary | ICD-10-CM

## 2012-10-22 DIAGNOSIS — J45909 Unspecified asthma, uncomplicated: Secondary | ICD-10-CM | POA: Diagnosis present

## 2012-10-22 DIAGNOSIS — H1013 Acute atopic conjunctivitis, bilateral: Secondary | ICD-10-CM | POA: Diagnosis present

## 2012-10-22 DIAGNOSIS — O99891 Other specified diseases and conditions complicating pregnancy: Principal | ICD-10-CM | POA: Diagnosis present

## 2012-10-22 DIAGNOSIS — L309 Dermatitis, unspecified: Secondary | ICD-10-CM | POA: Diagnosis present

## 2012-10-22 DIAGNOSIS — H01139 Eczematous dermatitis of unspecified eye, unspecified eyelid: Secondary | ICD-10-CM

## 2012-10-22 DIAGNOSIS — J310 Chronic rhinitis: Secondary | ICD-10-CM | POA: Diagnosis present

## 2012-10-22 MED ORDER — METHYLPREDNISOLONE SODIUM SUCC 125 MG IJ SOLR
80.0000 mg | Freq: Once | INTRAMUSCULAR | Status: AC
  Administered 2012-10-23: 80 mg via INTRAVENOUS
  Filled 2012-10-22: qty 2

## 2012-10-22 MED ORDER — IPRATROPIUM BROMIDE 0.02 % IN SOLN
0.5000 mg | Freq: Once | RESPIRATORY_TRACT | Status: AC
  Administered 2012-10-22: 0.5 mg via RESPIRATORY_TRACT
  Filled 2012-10-22: qty 2.5

## 2012-10-22 MED ORDER — ALBUTEROL SULFATE (5 MG/ML) 0.5% IN NEBU
5.0000 mg | INHALATION_SOLUTION | Freq: Once | RESPIRATORY_TRACT | Status: AC
  Administered 2012-10-22: 5 mg via RESPIRATORY_TRACT
  Filled 2012-10-22: qty 1

## 2012-10-22 MED ORDER — ALBUTEROL (5 MG/ML) CONTINUOUS INHALATION SOLN
10.0000 mg/h | INHALATION_SOLUTION | Freq: Once | RESPIRATORY_TRACT | Status: AC
  Administered 2012-10-23: 10 mg/h via RESPIRATORY_TRACT
  Filled 2012-10-22: qty 20

## 2012-10-22 NOTE — Telephone Encounter (Signed)
Patient states Flonase is not working and her allergies are still bothering her. Is there anything else that could be done?

## 2012-10-22 NOTE — ED Notes (Signed)
Pt. Reports asthma attack onset 3 days ago with occasional dry cough unrelieved by MDI , pt. also stated persistent itchy eyes with nasal congestion . Pt. Is [redacted] weeks pregnant G2P1

## 2012-10-23 ENCOUNTER — Encounter (HOSPITAL_COMMUNITY): Payer: Self-pay | Admitting: Family Medicine

## 2012-10-23 DIAGNOSIS — L2089 Other atopic dermatitis: Secondary | ICD-10-CM

## 2012-10-23 DIAGNOSIS — J45901 Unspecified asthma with (acute) exacerbation: Secondary | ICD-10-CM | POA: Diagnosis present

## 2012-10-23 DIAGNOSIS — D72829 Elevated white blood cell count, unspecified: Secondary | ICD-10-CM | POA: Diagnosis present

## 2012-10-23 DIAGNOSIS — J309 Allergic rhinitis, unspecified: Secondary | ICD-10-CM

## 2012-10-23 DIAGNOSIS — R000 Tachycardia, unspecified: Secondary | ICD-10-CM | POA: Diagnosis present

## 2012-10-23 DIAGNOSIS — Z331 Pregnant state, incidental: Secondary | ICD-10-CM

## 2012-10-23 DIAGNOSIS — H1010 Acute atopic conjunctivitis, unspecified eye: Secondary | ICD-10-CM

## 2012-10-23 LAB — COMPREHENSIVE METABOLIC PANEL
ALT: 7 U/L (ref 0–35)
AST: 11 U/L (ref 0–37)
Alkaline Phosphatase: 66 U/L (ref 39–117)
BUN: 7 mg/dL (ref 6–23)
CO2: 23 mEq/L (ref 19–32)
Calcium: 8.9 mg/dL (ref 8.4–10.5)
Chloride: 108 mEq/L (ref 96–112)
Creatinine, Ser: 0.52 mg/dL (ref 0.50–1.10)
GFR calc Af Amer: >90 mL/min (ref >90)
GFR calc non Af Amer: >90 mL/min (ref >90)
Glucose, Bld: 131 mg/dL — ABNORMAL HIGH (ref 70–99)
Sodium: 141 mEq/L (ref 135–145)
Total Bilirubin: 0.2 mg/dL — ABNORMAL LOW (ref 0.3–1.2)
Total Protein: 6.5 g/dL (ref 6.0–8.3)

## 2012-10-23 LAB — BASIC METABOLIC PANEL
BUN: 6 mg/dL (ref 6–23)
Calcium: 9.2 mg/dL (ref 8.4–10.5)
Chloride: 104 mEq/L (ref 96–112)
Creatinine, Ser: 0.54 mg/dL (ref 0.50–1.10)
GFR calc Af Amer: >90 mL/min (ref >90)
GFR calc non Af Amer: >90 mL/min (ref >90)
Glucose, Bld: 161 mg/dL — ABNORMAL HIGH (ref 70–99)

## 2012-10-23 LAB — CBC
HCT: 39.6 % (ref 36.0–46.0)
MCH: 34.1 pg — ABNORMAL HIGH (ref 26.0–34.0)
MCHC: 36.1 g/dL — ABNORMAL HIGH (ref 30.0–36.0)
Platelets: 361 10*3/uL (ref 150–400)
RDW: 12.5 % (ref 11.5–15.5)
WBC: 19.8 10*3/uL — ABNORMAL HIGH (ref 4.0–10.5)

## 2012-10-23 MED ORDER — PREDNISONE 20 MG PO TABS
40.0000 mg | ORAL_TABLET | Freq: Every day | ORAL | Status: DC
  Administered 2012-10-24 – 2012-10-25 (×2): 40 mg via ORAL
  Filled 2012-10-23 (×3): qty 2

## 2012-10-23 MED ORDER — SODIUM CHLORIDE 0.45 % IV SOLN
INTRAVENOUS | Status: AC
  Administered 2012-10-23 (×2): via INTRAVENOUS

## 2012-10-23 MED ORDER — PRENATAL MULTIVITAMIN CH
1.0000 | ORAL_TABLET | Freq: Every day | ORAL | Status: DC
  Administered 2012-10-24 – 2012-10-25 (×2): 1 via ORAL
  Filled 2012-10-23 (×3): qty 1

## 2012-10-23 MED ORDER — POLYETHYLENE GLYCOL 3350 17 G PO PACK
17.0000 g | PACK | Freq: Every day | ORAL | Status: DC | PRN
  Filled 2012-10-23: qty 1

## 2012-10-23 MED ORDER — POLYVINYL ALCOHOL 1.4 % OP SOLN
1.0000 [drp] | OPHTHALMIC | Status: DC | PRN
  Administered 2012-10-23: 1 [drp] via OPHTHALMIC
  Filled 2012-10-23: qty 15

## 2012-10-23 MED ORDER — MONTELUKAST SODIUM 10 MG PO TABS
10.0000 mg | ORAL_TABLET | Freq: Every day | ORAL | Status: DC
  Administered 2012-10-23 – 2012-10-24 (×2): 10 mg via ORAL
  Filled 2012-10-23 (×3): qty 1

## 2012-10-23 MED ORDER — LEVALBUTEROL HCL 0.63 MG/3ML IN NEBU
1.2500 mg | INHALATION_SOLUTION | RESPIRATORY_TRACT | Status: DC
  Administered 2012-10-23 (×2): 0.63 mg via RESPIRATORY_TRACT
  Administered 2012-10-23 (×2): 1.25 mg via RESPIRATORY_TRACT
  Filled 2012-10-23 (×7): qty 6

## 2012-10-23 MED ORDER — SALINE SPRAY 0.65 % NA SOLN
1.0000 | NASAL | Status: DC | PRN
Start: 2012-10-23 — End: 2012-10-25
  Administered 2012-10-23: 1 via NASAL
  Filled 2012-10-23: qty 44

## 2012-10-23 MED ORDER — LORATADINE 10 MG PO TABS
10.0000 mg | ORAL_TABLET | Freq: Every day | ORAL | Status: DC
  Administered 2012-10-23 – 2012-10-25 (×3): 10 mg via ORAL
  Filled 2012-10-23 (×3): qty 1

## 2012-10-23 MED ORDER — DIPHENHYDRAMINE HCL 50 MG/ML IJ SOLN
25.0000 mg | Freq: Once | INTRAMUSCULAR | Status: AC
  Administered 2012-10-23: 25 mg via INTRAVENOUS
  Filled 2012-10-23: qty 1

## 2012-10-23 MED ORDER — FLUTICASONE PROPIONATE 50 MCG/ACT NA SUSP
2.0000 | Freq: Every day | NASAL | Status: DC
  Administered 2012-10-23 – 2012-10-25 (×3): 2 via NASAL
  Filled 2012-10-23: qty 16

## 2012-10-23 MED ORDER — LEVALBUTEROL HCL 1.25 MG/0.5ML IN NEBU
1.2500 mg | INHALATION_SOLUTION | RESPIRATORY_TRACT | Status: DC | PRN
  Administered 2012-10-23 – 2012-10-24 (×3): 1.25 mg via RESPIRATORY_TRACT
  Filled 2012-10-23: qty 0.5

## 2012-10-23 MED ORDER — FLUTICASONE PROPIONATE HFA 220 MCG/ACT IN AERO
2.0000 | INHALATION_SPRAY | Freq: Two times a day (BID) | RESPIRATORY_TRACT | Status: DC
  Filled 2012-10-23: qty 12

## 2012-10-23 MED ORDER — TRIAMCINOLONE ACETONIDE 0.5 % EX CREA
TOPICAL_CREAM | Freq: Two times a day (BID) | CUTANEOUS | Status: DC
  Administered 2012-10-23 – 2012-10-24 (×3): via TOPICAL
  Administered 2012-10-25: 1 via TOPICAL
  Filled 2012-10-23: qty 15

## 2012-10-23 MED ORDER — HYDROCORTISONE VALERATE 0.2 % EX OINT
TOPICAL_OINTMENT | Freq: Two times a day (BID) | CUTANEOUS | Status: DC
  Administered 2012-10-23 – 2012-10-25 (×4): via TOPICAL
  Filled 2012-10-23: qty 15

## 2012-10-23 MED ORDER — IPRATROPIUM BROMIDE 0.02 % IN SOLN
0.5000 mg | RESPIRATORY_TRACT | Status: DC
  Administered 2012-10-23 – 2012-10-24 (×4): 0.5 mg via RESPIRATORY_TRACT
  Filled 2012-10-23 (×5): qty 2.5

## 2012-10-23 MED ORDER — MOMETASONE FURO-FORMOTEROL FUM 200-5 MCG/ACT IN AERO
2.0000 | INHALATION_SPRAY | Freq: Two times a day (BID) | RESPIRATORY_TRACT | Status: DC
  Administered 2012-10-23 – 2012-10-25 (×4): 2 via RESPIRATORY_TRACT
  Filled 2012-10-23: qty 8.8

## 2012-10-23 MED ORDER — SODIUM CHLORIDE 0.9 % IJ SOLN
3.0000 mL | Freq: Two times a day (BID) | INTRAMUSCULAR | Status: DC
  Administered 2012-10-23 – 2012-10-25 (×5): 3 mL via INTRAVENOUS

## 2012-10-23 MED ORDER — HYDROCORTISONE 0.5 % EX CREA
TOPICAL_CREAM | Freq: Three times a day (TID) | CUTANEOUS | Status: DC | PRN
  Filled 2012-10-23: qty 28.35

## 2012-10-23 MED ORDER — ALBUTEROL SULFATE (5 MG/ML) 0.5% IN NEBU
5.0000 mg | INHALATION_SOLUTION | Freq: Once | RESPIRATORY_TRACT | Status: AC
  Administered 2012-10-23: 5 mg via RESPIRATORY_TRACT
  Filled 2012-10-23: qty 1

## 2012-10-23 MED ORDER — TRIAMCINOLONE 0.1 % CREAM:EUCERIN CREAM 1:1: TOPICAL_CREAM | Freq: Two times a day (BID) | CUTANEOUS | Status: DC

## 2012-10-23 MED ORDER — SODIUM CHLORIDE 0.9 % IV SOLN
Freq: Once | INTRAVENOUS | Status: AC
  Administered 2012-10-23: 04:00:00 via INTRAVENOUS

## 2012-10-23 MED ORDER — ENOXAPARIN SODIUM 40 MG/0.4ML ~~LOC~~ SOLN
40.0000 mg | Freq: Every day | SUBCUTANEOUS | Status: DC
  Administered 2012-10-23: 10:00:00 40 mg via SUBCUTANEOUS
  Filled 2012-10-23 (×3): qty 0.4

## 2012-10-23 NOTE — ED Notes (Signed)
Report call awaiting transfer after change of shift

## 2012-10-23 NOTE — Telephone Encounter (Signed)
Flonase can take a week to be fully effective  If by that time it is not helping she should come in for a visit  Thanks  LC

## 2012-10-23 NOTE — ED Notes (Signed)
MD at bedside. 

## 2012-10-23 NOTE — Progress Notes (Signed)
Family Medicine Progress Note  Pts oupt OB team notified of admission [facility]) who agreed w/ current care plan and will see at next OB f/u appt.  Pt notified of class C medications being used to treat asthma exacerbation. Pt expressed understanding and endorsed previous treatment with these medications while pregnant.   Shelly Flatten, MD Family Medicine PGY-3 10/23/2012, 3:36 PM

## 2012-10-23 NOTE — ED Notes (Signed)
Patient states her head is congested, sats dip into 89%, placed patient back on nasal cannula 3 liter with noted good response of 94%, will continue to monitor

## 2012-10-23 NOTE — H&P (Signed)
Attending Addendum  I examined the patient and discussed the assessment and plan with Dr. Benjamin Stain. I have reviewed the note and agree.  Briefly, 27 yo F at [redacted] weeks GA admitted for asthma exacerbation requiring 2 hrs of CAT and solumedrol. Patient has been experiencing worsening asthma symptoms since onset of pregnancy. Has been compliant with home regimen of fluticasone 220 mcg 2 puffs BID ( 880 mg total daily dose, high dose) Experienced similarly worsening during first pregnancy (oldest child is now 2 yrs old) but not as bad as this. Possible triggers include FOBs cigarette smoke. Patient denies sick contacts and fever.   Today she still feels "crappy" but denies CP. Has some SOB. Requesting breakfast earlier today. Was NPO pending improvement in respiratory status.   BP 125/61  Pulse 101  Temp(Src) 98.1 F (36.7 C) (Oral)  Resp 22  Ht 5' (1.524 m)  Wt 159 lb 1.6 oz (72.167 kg)  BMI 31.07 kg/m2  SpO2 93%  LMP 08/29/2012 General appearance: alert, cooperative and no distress Lungs: norma WOB w/o use of accessory muscles, diffuse inspiratory and expiratory wheezing bilaterally.  Heart: regular rate and rhythm, S1, S2 normal, no murmur, click, rub or gallop Abdomen: soft, gravid  Extremities: extremities normal, atraumatic, no cyanosis or edema Skin: xerotic erythematous macular rash under eyes, around mouth and b/l Upper and lowrer extremities. no rash on trunk evidence of excoriation on low back.  A:P: 27 yo gavid female admitted with asthma exacerbation and evidence of extensive atopy on exam.   1. Asthma exacerbation: A: improving  P: -Space xopenex as tolerated ( xopenex instead of albuterol due to tachycardia) -Continue PO prednisone with slow taper planned.  -Step up asthma controller medication from high dose ICS to ICS+LABA (dulera)  -Continue Singulair   2. Skin rash consistent with atrophic dermatitis.  P: oral steroid Med potency topical steroid for face  wes-cort  0.2 % ointment  BID x 7 days High potency steroid for body kenalog 0.5 cream% x 7 days     Dessa Phi, MD FAMILY MEDICINE TEACHING SERVICE

## 2012-10-23 NOTE — Telephone Encounter (Signed)
LMOVM for pt to return call .Fleeger, Jessica Dawn  

## 2012-10-23 NOTE — Progress Notes (Signed)
Family Medicine Teaching Service Daily Progress Note Intern Pager: [phone]  Patient name: [name] [name] Medical record number: [id number] Date of birth: [birth year] Age: 27 y.o. Gender: female  Primary Care Provider: Carney Living, MD Consultants:  Code Status:   Pt Overview and Major Events to Date:  Pt. Condition improving. She complains of difficulty taking a deep breath and coughing. No other complaints per patient.  Assessment and Plan: [name] [name] is a 27 y.o. female presenting with shortness of breath and coughing X 2-3 days along with worsened allergy symptoms x 1 month. PMH is significant for asthma, 1st trimester of pregnancy, and eczema.   # Asthma exacerbation - slight improvement since admission. No O2 requirement. Possibly related to pregnancy, also possibly related to undetermined allergen that has caused 1 month of increased allergic symptoms. Rhinorrhea and cough present. Leukocytosis with no fever. Discharged 9/22 for the similar symptoms with CAP. S/p solumedrol in ED.  - continue inpatient telemetry  - Scheduling Duonebs Q4 hours /Q2 hours prn  - Continue home flonase, claritin, singulair, and flovent  - continue 4 more doses of prednisone 40mg  daily  - O2 PRN  - Hold off on Chest xray unless pt develops signs of infection - PREGNANT - AM CBC  - Asthma teaching of MDI with spacer, action plan, and flu shot (if not had previously) prior to discharge   # Rash/allergies - unchanged since admission. Allergic shiners adn eczematous changes. Diffuse excoriations. Primarily on arms and legs. On Claritin, flonase, singulair, and eye drops.  Also possibly related to atopy or pregnancy (PUPP), also consider atopic dermatitis worsened by pregnancy, and possible scabies (unlikely due to fact that no other household members has similar rash). Per patient, she had similar rash exacerbation during third trimester of previous pregnancy that occurred during fall and winter  months - Hydrocortisone cream for rash, continue to monitor  - Prednisone as above  #.Tachycardia - improving. HR now in low 100s. Likely secondary to albuterol  - Monitor on telemetry    # Leukocytosis - improved from 20.1 to 19.8. Likely stress reaction and secondary to steroids. AF.   - Monitor for fever  - CBC tomorrow   # Pregnancy - 9 weeks per patient - Start PNV - Will check fetal heart tones with doppler or ultrasound  FEN/GI: tolerating PO.  - I/2 NS at 125cc/hr x 12 hours  Prophylaxis: SQ lovenox   Disposition: Patient's condition is somewhat improved. Tachycardia and leukocytosis improving. Patient will likely be discharged home tomorrow.  Subjective: 27 yo pregnant female presented with shortness of breath and cough likely due to asthma exacerbation. Pt. Condition improving. She complains of difficulty taking a deep breath and coughing. No other complaints per patient.  Objective: Temp:  [98.1 F (36.7 C)-98.8 F (37.1 C)] 98.1 F (36.7 C) (10/22 0749) Pulse Rate:  [101-130] 101 (10/22 0749) Resp:  [10-34] 22 (10/22 0749) BP: (99-131)/(50-71) 125/61 mmHg (10/22 0749) SpO2:  [89 %-100 %] 93 % (10/22 0749) Weight:  [159 lb 1.6 oz (72.167 kg)-160 lb (72.576 kg)] 159 lb 1.6 oz (72.167 kg) (10/22 0749) Physical Exam: General: mild distress, pleasant, appears uncomfortable, sitting up in bed Cardiovascular: tachycardia low 100s, reg rhythm, no murmur detected Respiratory: wheezing bilaterally in all lung fields, mild increase WOB. Mildly tachypnic w/ staccato speech, on RA Extremities:  HEENT: sclera clear, clear rhinorrhea,  allergic shiners  Skin: Back, legs, arms (especialy antecubital fossa) with erythematous, maculopapular generalized rash with excoriations but no pustules or exudate or  bleeding;   Neuro: Awake, alert, no focal deficits, normal speech, normal muscle tone   Laboratory: Results for orders placed during the hospital encounter of 10/22/12 (from  the past 24 hour(s))  BASIC METABOLIC PANEL     Status: Abnormal   Collection Time    10/23/12  4:15 AM      Result Value Range   Sodium 137  135 - 145 mEq/L   Potassium 3.5  3.5 - 5.1 mEq/L   Chloride 104  96 - 112 mEq/L   CO2 21  19 - 32 mEq/L   Glucose, Bld 161 (*) 70 - 99 mg/dL   BUN 6  6 - 23 mg/dL   Creatinine, Ser 4.09  0.50 - 1.10 mg/dL   Calcium 9.2  8.4 - 81.1 mg/dL   GFR calc non Af Amer >90  >90 mL/min   GFR calc Af Amer >90  >90 mL/min  CBC     Status: Abnormal   Collection Time    10/23/12  4:15 AM      Result Value Range   WBC 19.8 (*) 4.0 - 10.5 K/uL   RBC 4.19  3.87 - 5.11 MIL/uL   Hemoglobin 14.3  12.0 - 15.0 g/dL   HCT 91.4  78.2 - 95.6 %   MCV 94.5  78.0 - 100.0 fL   MCH 34.1 (*) 26.0 - 34.0 pg   MCHC 36.1 (*) 30.0 - 36.0 g/dL   RDW 21.3  08.6 - 57.8 %   Platelets 361  150 - 400 K/uL    Imaging/Diagnostic Tests:  Rolm Bookbinder OMS IV   ------------------------------------------------------------------------- Resident Addendum  Pt seen and examined. Note addended by me and agree w/ above A/P Shelly Flatten, MD Family Medicine PGY-3 10/23/2012, 1:59 PM

## 2012-10-23 NOTE — ED Notes (Signed)
Notify Dr Lavella Lemons of patient current situation, will continue to monitor

## 2012-10-23 NOTE — ED Provider Notes (Addendum)
CSN: [id number]     Arrival date & time 10/22/12  2146 History   First MD Initiated Contact with Patient 10/23/12 0301     Chief Complaint  Patient presents with  . Asthma   (Consider location/radiation/quality/duration/timing/severity/associated sxs/prior Treatment) HPI Patient is a   27 yo woman with asthma who presents with complaints of wheezing and SOB and minimally productive cough x several days. The patient has required hospitalization for inpatient treatment of asthma multiple times in the past but, has never been intubated. She has been taking a prednisone taper as an outpatient. She was hospitalized earlier in the month for the same sx. Despite the use of her Albuterol MDI and nebulized treatment and compliance with all medications, she is having persistent wheeezing and SOB which is worse with exertion. Mild, nonproductive cough. No fever.   Past Medical History  Diagnosis Date  . Asthma   . Pregnant state, incidental   . Breast abscess   . Family history of anesthesia complication     Grandmother has difficulty waskin up.  . Depression     HX of - not on med n, doing well.  . Pneumonia     years ago has had a few times.  . Eczema    Past Surgical History  Procedure Laterality Date  . No past surgeries    . Incise and drain abcess      left breast  . Incision and drainage abscess Left 02/17/2012    Procedure:  DRAINAGEof recurrent breast abscess;  Surgeon: Shelly Rubenstein, MD;  Location: [facility] OR;  Service: General;  Laterality: Left;   Family History  Problem Relation Age of Onset  . Healthy Mother   . Healthy Father   . Stroke Maternal Grandmother   . Stroke Paternal Grandfather    History  Substance Use Topics  . Smoking status: Former Smoker    Quit date: 06/22/2010  . Smokeless tobacco: Never Used  . Alcohol Use: No   OB History   Grav Para Term Preterm Abortions TAB SAB Ect Mult Living   2 1  1      1      Review of Systems 10 point ROS performed  and is negative x for symptoms noted above and complaints of bilateral periorbital skin irritation and pain secondary to excema.   Allergies  Doxycycline  Home Medications   Current Outpatient Rx  Name  Route  Sig  Dispense  Refill  . albuterol (PROVENTIL HFA;VENTOLIN HFA) 108 (90 BASE) MCG/ACT inhaler   Inhalation   Inhale 2 puffs into the lungs every 6 (six) hours as needed for wheezing.         . fluticasone (FLONASE) 50 MCG/ACT nasal spray      2 sprays each nostril daily   16 g   2   . fluticasone (FLOVENT HFA) 220 MCG/ACT inhaler   Inhalation   Inhale 2 puffs into the lungs 2 (two) times daily.   1 Inhaler   12   . loratadine (CLARITIN) 10 MG tablet   Oral   Take 1 tablet (10 mg total) by mouth daily.   90 tablet   1   . montelukast (SINGULAIR) 10 MG tablet   Oral   Take 1 tablet (10 mg total) by mouth at bedtime.   30 tablet   3    BP 104/56  Pulse 119  Temp(Src) 98.1 F (36.7 C) (Oral)  Resp 25  Ht 5' (1.524 m)  Wt 160  lb (72.576 kg)  BMI 31.25 kg/m2  SpO2 93%  LMP 08/29/2012 Physical Exam Gen: ill appearing, alert, oriented, cooperative Head: NCAT Eyes: peral, eomi, conjunctiva mildly injected bilaterally Nose: normal to inspection Mouth: oral mucosa appears well hydrated, posterior oropharynx is normal to inspection Neck: no stridor, supple Lungs: RR 28 to 32/min, diffuse wheezing, accessory muscle use  Noted CV: rapid and regular, good peripheral pulses x 4 Abd: soft, nontender, nondistended Ext: no edema, nontender Skin: eczematous changes in the periorbital region bilaterally, warm and dry.  Psyche: flat, mildly depressed appearing affect, calm and cooperative  ED Course  Procedures (including critical care time) Labs Review CBC notable for WBC of 14K. H/H wnl. CMP notable for glucose of 131 mg/dL - otherwise unremarkable.    MDM  Patient with status asthmaticus and continuous oxygen requirement despite tx with multiple nebs and  prednisone. She remains tachycardic and tachypneic. Case discussed with [facility] admitting service and the patient will be admitted.     Brandt Loosen, MD 10/28/12 1556  CRITICAL CARE Performed by: Brandt Loosen   Total critical care time: 80m  Critical care time was exclusive of separately billable procedures and treating other patients.  Critical care was necessary to treat or prevent imminent or life-threatening deterioration.  Critical care was time spent personally by me on the following activities: development of treatment plan with patient and/or surrogate as well as nursing, discussions with consultants, evaluation of patient's response to treatment, examination of patient, obtaining history from patient or surrogate, ordering and performing treatments and interventions, ordering and review of laboratory studies, ordering and review of radiographic studies, pulse oximetry and re-evaluation of patient's condition.   Brandt Loosen, MD 10/28/12 [phone]

## 2012-10-23 NOTE — Progress Notes (Signed)
During RN assessment, RN noticed small red rash to patients extremities and abdomen.  MD notified.  New orders for isolation placed. RN will continue to monitor. Louretta Parma, RN

## 2012-10-23 NOTE — Progress Notes (Signed)
Patient complained of dry eye.  MD notified and artificial tears ordered.  RN will administer to patient PRN per [facility]. RN will continue to monitor. Louretta Parma, RN

## 2012-10-23 NOTE — H&P (Signed)
Family Medicine Teaching [facility] Admission History and Physical Service Pager: [phone]  Patient name: [name] [name] Medical record number: [id number] Date of birth: 1985-12-24 Age: 27 y.o. Gender: female  Primary Care Provider: Carney Living, MD Consultants: none Code Status: Full  Chief Complaint: Coughing and shortness of breath  Assessment and Plan: [name] is a 26 y.o. female presenting with shortness of breath and coughing  X 2-3 days along with worsened allergy symptoms x 1 month. PMH is significant for asthma, 1st trimester of pregnancy, and eczema.   # Asthma exacerbation - Possibly related to pregnancy, also possibly related to an as-of-yet undetermined allergen that has caused 1 month of increased allergic symptoms. No fever or URI symptoms. Leukocytosis with no fever. Discharged 9/22 for the same with CAP. - Admit to inpatient telemetry - Scheduling xopenex Q4 hours /Q2 hours prn given tachycardia - Continue home flonase, claritin, singulair, and flovent - S/p solu-medrol last night, continue 4 more doses of prednisone 40mg  daily - Consider adding LABA (salmeterol or fomoterol), possibly adding advair and d/c'ing flovent - Vitals including pulse oximetry per floor protocol - O2 PRN - Hold off on Chest xray unless pt develops signs of infection - AM CBC - Re-discuss husband smoking cessation - Asthma teaching of MDI with spacer, action plan, and flu shot (if not had previously) prior to discharge  # Rash/allergies - Severe skin symptoms on arms, legs, and around eyes with deep allergic shiners with some skin hypertrophy - ?failed outpatient claritin, flonase, singulair, and eye drops she cannot remember name of - Also possibly related to atopy or pregnancy (PUPP) - Similar allergy to doxycycline but pt has not had this recently - Continue home meds, monitor - CMET to check bilirubin  # Tachycardia - Likely secondary to albuterol - Monitor on  telemetry - Xopenex instead of albuterol - If chest pain, further workup with EKG and troponins, though unlikely  # Leukocytosis - No fever, possibly due to steroids - Monitor for fever - CBC tomorrow  # Pregnancy - No prenatal vitamin seen on patient's admission list - Discuss with her in AM and start if no contraindication  FEN/GI: NPO until breathing more easily, consider diet around lunchtime; 1/2 NS at 125cc/hr x 12 hours Prophylaxis: SQ lovenox  Disposition: Admit to telemetry, discharge pending improvement in respiratory status.  History of Present Illness: [name] [name] is a 27 y.o. female presenting with shortness of breath and cough likely in an asthma exacerbation. Symptoms had not fully improved since hospitalization 9/20, and 2-3 days ago she started having typical asthma exacerbation symptoms of shortness of breath and coughing and was needing albuterol 2-3 times daily with no improvement. She had thin whitish sputum on cough. Denies fevers, chills, sneeze, or sore throat. Does not recall any sick contacts. Has not had any new exposures to medication (other than singulair, flonase, claritin, eye drops) or any new skin products. She does not smoke but husband does. Triggers include change of season.   She also reports having allergic skin symptoms with eye itchiness and arm/leg rash x 1 month with no improvement with claritin, flonase, and singulair. Over the past couple days, skin around eyes has become dark and red. Skin also seems very photosensitive. She tried various eye drops with no improvement. She was unable to afford lastacaft ocular antihitamine prescribed 10/13 at prenatal visit but did start singulair at that time. She had similar allergic symptoms this time last year that she thought were due to doxycycline,  but she has not had any doxy this time.  Of note, pt is in 1st trimester of pregnancy. She does not feel like asthma exacerbation symptoms fully cleared since last  admission 9/20, at which time she was treated for CAP and asthma treated with albuterol, steroids, and azithromycin with increase in flovent to 2 puffs BID at discharge. Her husband is a smoker and this poses increased risk for patient's exacerbations.  In the ED, patient received continuous albuterol, solu-medrol around midnight, and one dose of atrovent along with maintenance IV fluids. She was satting low 90s% on room air.  Review Of Systems: Per HPI. Otherwise 12 point review of systems was performed and was unremarkable.  Patient Active Problem List   Diagnosis Date Noted  . Allergic conjunctivitis of both eyes and rhinitis 10/14/2012  . Pregnancy 09/22/2012  . Mastitis, Left. 11/27/2011  . Depression 03/24/2011  . ASTHMA, PERSISTENT 03/01/2006  . ECZEMA, ATOPIC DERMATITIS 03/01/2006   Past Medical History: Past Medical History  Diagnosis Date  . Asthma   . Pregnant state, incidental   . Breast abscess   . Family history of anesthesia complication     Grandmother has difficulty waskin up.  . Depression     HX of - not on med n, doing well.  . Pneumonia     years ago has had a few times.  . Eczema    Past Surgical History: Past Surgical History  Procedure Laterality Date  . No past surgeries    . Incise and drain abcess      left breast  . Incision and drainage abscess Left 02/17/2012    Procedure:  DRAINAGEof recurrent breast abscess;  Surgeon: Shelly Rubenstein, MD;  Location: [facility] OR;  Service: General;  Laterality: Left;   Social History: History  Substance Use Topics  . Smoking status: Former Smoker    Quit date: 06/22/2010  . Smokeless tobacco: Never Used  . Alcohol Use: No   Additional social history:   None since 2012 No drugs or etoh Previous h/o husband smoking indoors Please also refer to relevant sections of EMR.  Family History: Family History  Problem Relation Age of Onset  . Healthy Mother   . Healthy Father   . Stroke Maternal Grandmother    . Stroke Paternal Grandfather    Allergies and Medications: Allergies  Allergen Reactions  . Doxycycline Other (See Comments)    Photo sensitivity   No current facility-administered medications on file prior to encounter.   Current Outpatient Prescriptions on File Prior to Encounter  Medication Sig Dispense Refill  . albuterol (PROVENTIL HFA;VENTOLIN HFA) 108 (90 BASE) MCG/ACT inhaler Inhale 2 puffs into the lungs every 6 (six) hours as needed for wheezing.      . fluticasone (FLONASE) 50 MCG/ACT nasal spray 2 sprays each nostril daily  16 g  2  . fluticasone (FLOVENT HFA) 220 MCG/ACT inhaler Inhale 2 puffs into the lungs 2 (two) times daily.  1 Inhaler  12  . loratadine (CLARITIN) 10 MG tablet Take 1 tablet (10 mg total) by mouth daily.  90 tablet  1  . montelukast (SINGULAIR) 10 MG tablet Take 1 tablet (10 mg total) by mouth at bedtime.  30 tablet  3    Objective: BP 99/50  Pulse 111  Temp(Src) 98.8 F (37.1 C) (Oral)  Resp 18  Ht 5' (1.524 m)  Wt 160 lb (72.576 kg)  BMI 31.25 kg/m2  SpO2 94%  LMP 08/29/2012 Exam: General: NAD, pleasant,  appears uncomfortable HEENT: AT/[place], sclera clear, EOMI, PERRL, allergic shiners and hyperpigmented/erythematous dry thickened skin around eyes Cardiovascular: Tachycardic to 110s, regular rhythm, no m/r/g Respiratory: Diffuse wheezing throughout on expiration, prolonged expiratory phase, mild increased WOB, some overall tight-sounding decrease of air movement Abdomen: Soft, nondistended Extremities: No LE edema or calf tenderness Skin: Back, legs, arms with maculopapular generalized rash with excoriations but no pustules or exudate or bleeding; around eyes with fine thickening of skin and slight erythema Neuro: Awake, alert, no focal deficits, normal speech, normal muscle tone  Labs and Imaging: CBC BMET   Recent Labs Lab 10/23/12 0415  WBC 19.8*  HGB 14.3  HCT 39.6  PLT 361    Recent Labs Lab 10/23/12 0415  NA 137  K 3.5   CL 104  CO2 21  BUN 6  CREATININE 0.54  GLUCOSE 161*  CALCIUM 9.2      Leona Singleton, MD 10/23/2012, 5:31 AM PGY-2, [place] [facility] FPTS Intern pager: [phone], text pages welcome

## 2012-10-23 NOTE — ED Notes (Signed)
0200 patient completed cont albertol treament about thirty min ago, pt states her breathing has not improved. Pt maintains room air sats  89% to 91 %, placed patient on nasal cannula 3l stats have increased to 96%, notify er md

## 2012-10-23 NOTE — Progress Notes (Signed)
Utilization review completed.  

## 2012-10-24 DIAGNOSIS — H01139 Eczematous dermatitis of unspecified eye, unspecified eyelid: Secondary | ICD-10-CM

## 2012-10-24 LAB — GLUCOSE, CAPILLARY: Glucose-Capillary: 126 mg/dL — ABNORMAL HIGH (ref 70–99)

## 2012-10-24 LAB — CBC
HCT: 38.4 % (ref 36.0–46.0)
Hemoglobin: 13.6 g/dL (ref 12.0–15.0)
RBC: 4.06 MIL/uL (ref 3.87–5.11)
RDW: 12.6 % (ref 11.5–15.5)
WBC: 14.3 10*3/uL — ABNORMAL HIGH (ref 4.0–10.5)

## 2012-10-24 MED ORDER — IPRATROPIUM BROMIDE 0.02 % IN SOLN
0.5000 mg | RESPIRATORY_TRACT | Status: DC
  Administered 2012-10-24 – 2012-10-25 (×6): 0.5 mg via RESPIRATORY_TRACT
  Filled 2012-10-24 (×6): qty 2.5

## 2012-10-24 MED ORDER — IPRATROPIUM BROMIDE 0.02 % IN SOLN
0.5000 mg | Freq: Four times a day (QID) | RESPIRATORY_TRACT | Status: DC
  Administered 2012-10-24: 13:00:00 0.5 mg via RESPIRATORY_TRACT
  Filled 2012-10-24 (×2): qty 2.5

## 2012-10-24 MED ORDER — LEVALBUTEROL HCL 0.63 MG/3ML IN NEBU
1.2500 mg | INHALATION_SOLUTION | Freq: Four times a day (QID) | RESPIRATORY_TRACT | Status: DC
  Administered 2012-10-24: 13:00:00 1.25 mg via RESPIRATORY_TRACT
  Filled 2012-10-24 (×6): qty 6

## 2012-10-24 MED ORDER — LEVALBUTEROL HCL 0.63 MG/3ML IN NEBU
1.2500 mg | INHALATION_SOLUTION | RESPIRATORY_TRACT | Status: DC
  Administered 2012-10-24 (×3): 1.25 mg via RESPIRATORY_TRACT
  Administered 2012-10-25: 0.63 mg via RESPIRATORY_TRACT
  Administered 2012-10-25: 1.25 mg via RESPIRATORY_TRACT
  Filled 2012-10-24 (×8): qty 6

## 2012-10-24 MED ORDER — AZITHROMYCIN 500 MG PO TABS
500.0000 mg | ORAL_TABLET | Freq: Every day | ORAL | Status: DC
  Administered 2012-10-24 – 2012-10-25 (×2): 500 mg via ORAL
  Filled 2012-10-24 (×2): qty 1

## 2012-10-24 MED ORDER — DEXTROSE 5 % IV SOLN
1.0000 g | INTRAVENOUS | Status: DC
  Administered 2012-10-24 – 2012-10-25 (×2): 1 g via INTRAVENOUS
  Filled 2012-10-24 (×2): qty 10

## 2012-10-24 NOTE — Progress Notes (Signed)
Family Medicine Teaching Service Daily Progress Note Intern Pager: [phone]  Patient name: [name] [name] Medical record number: [id number] Date of birth: April 06, 1985 Age: 27 y.o. Gender: female  Primary Care Provider: Carney Living, MD Consultants: none Code Status: full  Pt Overview and Major Events to Date:  Pt. Condition improving. She complains of difficulty taking a deep breath and coughing. No other complaints per patient.  Assessment and Plan: [name] [name] is a 27 y.o. female presenting with shortness of breath and coughing X 2-3 days along with worsened allergy symptoms x 1 month. PMH is significant for asthma, 1st trimester of pregnancy, and eczema.   # Asthma exacerbation - minimal improvement since admission. No O2 requirement. Rhinorrhea and cough present. Leukocytosis with no fever. Discharged 9/22 for the similar symptoms with CAP. S/p solumedrol in ED. Consider CAP given poor response to steroids - continue inpatient telemetry  - Scheduling Duonebs Q4 hours /Q2 hours prn  - Continue home flonase, claritin, singulair, and flovent  - continue 4 more doses of prednisone 40mg  daily  - O2 PRN  - AM CBC  - Asthma teaching of MDI with spacer, action plan, and flu shot (if not had previously) prior to discharge  - Start ceftriaxone/azithromycin for possible CAP, will forgo CXR given pregnancy  # Rash/allergies - unchanged since admission. Allergic shiners adn eczematous changes. Diffuse excoriations. Primarily on arms and legs. On Claritin, flonase, singulair, and eye drops.  Also possibly related to atopy or pregnancy (PUPP), also consider atopic dermatitis worsened by pregnancy, and possible scabies (unlikely due to fact that no other household members has similar rash). Per patient, she had similar rash exacerbation during third trimester of previous pregnancy that occurred during fall and winter months - Hydrocortisone cream for rash, continue to monitor  - Prednisone  as above  #.Tachycardia - improving. HR now in 90s. Likely secondary to albuterol  - Monitor on telemetry    # Leukocytosis - improving from 20.1-> 19.8-> 14.3. Likely stress reaction and secondary to steroids. AF.   - Monitor for fever  - CBC tomorrow   # Pregnancy - 9 weeks per patient - PNV - positive fetal heart tones 10/22, rate 150bpm  FEN/GI: tolerating PO. Saline lock IV  Prophylaxis: SQ lovenox  Disposition: Home tomorrow pending improvement in respiratory status and transition to PO antibiotics  Subjective: Still with shortness of breath and cough. Otherwise doing well.  Objective: Temp:  [97.6 F (36.4 C)-98.7 F (37.1 C)] 97.6 F (36.4 C) (10/23 2952) Pulse Rate:  [92-116] 92 (10/23 0637) Resp:  [18-21] 20 (10/23 0637) BP: (105-124)/(62-72) 105/64 mmHg (10/23 0637) SpO2:  [94 %-97 %] 96 % (10/23 0637) Weight:  [161 lb 12.8 oz (73.392 kg)] 161 lb 12.8 oz (73.392 kg) (10/23 8413) Physical Exam: General: mild distress, pleasant, appears uncomfortable, sitting up in bed Cardiovascular: tachycardia low 100s, reg rhythm, no murmur detected Respiratory: wheezing bilaterally in all lung fields, normal WOB. Speaks in full sentences Extremities: no LE edema HEENT: sclera clear, clear rhinorrhea Skin: Back, legs, arms (especialy antecubital fossa) with erythematous, maculopapular generalized rash Neuro: Awake, alert, no focal deficits, normal speech   Laboratory: Results for orders placed during the hospital encounter of 10/22/12 (from the past 24 hour(s))  COMPREHENSIVE METABOLIC PANEL     Status: Abnormal   Collection Time    10/23/12  2:15 PM      Result Value Range   Sodium 141  135 - 145 mEq/L   Potassium 4.3  3.5 - 5.1  mEq/L   Chloride 108  96 - 112 mEq/L   CO2 23  19 - 32 mEq/L   Glucose, Bld 131 (*) 70 - 99 mg/dL   BUN 7  6 - 23 mg/dL   Creatinine, Ser 9.60  0.50 - 1.10 mg/dL   Calcium 8.9  8.4 - 45.4 mg/dL   Total Protein 6.5  6.0 - 8.3 g/dL   Albumin  3.3 (*) 3.5 - 5.2 g/dL   AST 11  0 - 37 U/L   ALT 7  0 - 35 U/L   Alkaline Phosphatase 66  39 - 117 U/L   Total Bilirubin 0.2 (*) 0.3 - 1.2 mg/dL   GFR calc non Af Amer >90  >90 mL/min   GFR calc Af Amer >90  >90 mL/min  CBC     Status: Abnormal   Collection Time    10/24/12  4:00 AM      Result Value Range   WBC 14.3 (*) 4.0 - 10.5 K/uL   RBC 4.06  3.87 - 5.11 MIL/uL   Hemoglobin 13.6  12.0 - 15.0 g/dL   HCT 09.8  11.9 - 14.7 %   MCV 94.6  78.0 - 100.0 fL   MCH 33.5  26.0 - 34.0 pg   MCHC 35.4  30.0 - 36.0 g/dL   RDW 82.9  56.2 - 13.0 %   Platelets 353  150 - 400 K/uL    Beverely Low, MD, MPH [facility] PGY-1 10/24/2012 8:20 AM

## 2012-10-24 NOTE — Progress Notes (Signed)
FMTS Attending Daily Note:  Renold Don MD  [phone] pager  Family Practice pager:  [phone] I have seen and examined this patient and have reviewed their chart. I have discussed this patient with the resident. I agree with the resident's findings, assessment and care plan.  Additionally:  Still not greatly improved.  Question of CAP with chills, productive cough, no improvement in asthma, poor air flow. Patient hospitalized for <2 days last month, therefore treat as CAP rather than HCAP.    Tobey Grim, MD 10/24/2012 4:39 PM

## 2012-10-24 NOTE — Progress Notes (Signed)
Pt had c/o SOB, PRN resp treatment given as ordered, pt has not had any c/o pain, resting in bed, has good appetitie, will continue to monitor

## 2012-10-24 NOTE — Progress Notes (Signed)
  Family Medicine Teaching Service Interim Progress Note  Dr. Pollie Meyer and I spoke with patient this evening. She states she is feeling better. Rash is dissipating and her breathing is easier since beginning antibiotics. She still has a cough and some SOB on exertion.   VSS with borderline tachycardia, afebrile, saturating well on room air. Scattered wheezes and occasional rhonchi, non-labored respirations. Mild erythematous, eczematous, eruption on bilateral arms and shins.   Will continue to monitor respiratory status.   Korde Jeppsen B. Jarvis Newcomer, MD, PGY-1 10/24/2012 10:28 PM

## 2012-10-25 DIAGNOSIS — F329 Major depressive disorder, single episode, unspecified: Secondary | ICD-10-CM

## 2012-10-25 MED ORDER — SALINE SPRAY 0.65 % NA SOLN: 1.0000 | NASAL | Status: DC | PRN

## 2012-10-25 MED ORDER — AZITHROMYCIN 250 MG PO TABS: ORAL_TABLET | ORAL | Status: DC

## 2012-10-25 MED ORDER — PRENATAL MULTIVITAMIN CH: 1.0000 | ORAL_TABLET | Freq: Every day | ORAL | Status: DC

## 2012-10-25 MED ORDER — FLUTICASONE-SALMETEROL 45-21 MCG/ACT IN AERO: 2.0000 | INHALATION_SPRAY | Freq: Two times a day (BID) | RESPIRATORY_TRACT | Status: DC

## 2012-10-25 MED ORDER — PREDNISONE 20 MG PO TABS: 40.0000 mg | ORAL_TABLET | Freq: Every day | ORAL | Status: DC

## 2012-10-25 MED ORDER — ALBUTEROL SULFATE HFA 108 (90 BASE) MCG/ACT IN AERS: 2.0000 | INHALATION_SPRAY | Freq: Four times a day (QID) | RESPIRATORY_TRACT | Status: DC | PRN

## 2012-10-25 MED ORDER — LEVALBUTEROL HCL 1.25 MG/0.5ML IN NEBU
1.2500 mg | INHALATION_SOLUTION | RESPIRATORY_TRACT | Status: DC
  Administered 2012-10-25: 13:00:00 1.25 mg via RESPIRATORY_TRACT
  Filled 2012-10-25 (×6): qty 0.5

## 2012-10-25 MED ORDER — OLOPATADINE HCL 0.1 % OP SOLN: 1.0000 [drp] | Freq: Two times a day (BID) | OPHTHALMIC | Status: DC

## 2012-10-25 MED ORDER — POLYVINYL ALCOHOL 1.4 % OP SOLN: 1.0000 [drp] | OPHTHALMIC | Status: DC | PRN

## 2012-10-25 NOTE — Discharge Summary (Signed)
Family Medicine Teaching [facility] Discharge Summary  Patient name: [name] [name] Medical record number: [id number] Date of birth: 06/25/1985 Age: 27 y.o. Gender: female Date of Admission: 10/22/2012  Date of Discharge: 10/25/12 Admitting Physician: Lora Paula, MD  Primary Care Provider: Carney Living, MD Consultants:   Indication for Hospitalization: Acute Asthma Exacerbation  Discharge Diagnoses/Problem List:  1. Asthma, persistent 2. Eczema, atopic dermatitis 3. Pregnancy 4. Allergic conjunctivitis of both eyes and rhinitis 5. Acute asthma exacerbation 6. Tachycardia 7. Leukocytosis  Disposition: Improved since admission. Will discharge home. F/u with PCP in 3-5 days.  Discharge Condition: Improved since admission. 97% on room air.   Discharge Exam: General: no apparent distress, pleasant, resting comfortably in bed  Cardiovascular: reg rhythm, reg rate, no murmur detected  Respiratory: wheezing bilaterally in all lung fields, normal WOB. Speaks in full sentences, 97% on room air  Extremities: no LE edema  HEENT: sclera clear, clear rhinorrhea, decreased redness surrounding eyes  Skin: Legs and arms (especialy antecubital fossa) with dryness, mild erythematous, maculopapular rash on arms and legs.  Neuro: Awake, alert, no focal deficits, normal speech   Brief Hospital Course: 27 yo female presented to ED for shortness of breath and cough for 2-3 days on 10/22/12. She has PMH significant for asthma, eczema, and current 1st trimester pregnancy.  # Asthma exacerbation - improving. No O2 requirement. Cough present. Leukocytosis with no fever. Discharged 9/22 for the similar symptoms with CAP. S/p solumedrol in ED. Now treating as HCAP.  - Received Duonebs Q4 hours /Q2 hours prn  - Continued home flonase, claritin, singulair, and flovent  - Received 3 doses of prednisone 40mg  daily  - O2 PRN  - Asthma teaching of MDI with spacer, action plan, and flu  shot (if not had previously) prior to discharge  - Started ceftriaxone/azithromycin 10/24/12 for possible CAP, will forgo CXR given pregnancy  - Transition to oral azithromycin only  # Rash/allergies - improved significantly since admission. Allergic shiners and eczematous changes. Diffuse excoriations. Primarily on arms and legs. On Claritin, flonase, singulair, and eye drops. Possibly related to atopy of pregnancy (PUPP), also consider atopic dermatitis worsened by pregnancy. Per patient, she had similar rash exacerbation during third trimester of previous pregnancy that occurred during fall and winter months  - Hydrocortisone cream given for rash, improved rash significantly - Prednisone as above  #.Tachycardia - improved. HR in 90s. Likely secondary to albuterol  # Leukocytosis - improved from 20.1-> 19.8-> 14.3. Likely stress reaction and secondary to steroids.  - afebrile during entire hospital course # Pregnancy - 9 weeks per patient  - Started prenatal vitamin. Pt. Will continue prenatal vitamin at home. - positive fetal heart tones 10/23/12, rate 150bpm - positive fetal heart tones on repeat 10/25/12     Significant Labs and Imaging:   Recent Labs Lab 10/23/12 0415 10/24/12 0400  WBC 19.8* 14.3*  HGB 14.3 13.6  HCT 39.6 38.4  PLT 361 353    Recent Labs Lab 10/23/12 0415 10/23/12 1415  NA 137 141  K 3.5 4.3  CL 104 108  CO2 21 23  GLUCOSE 161* 131*  BUN 6 7  CREATININE 0.54 0.52  CALCIUM 9.2 8.9  ALKPHOS  --  66  AST  --  11  ALT  --  7  ALBUMIN  --  3.3*      Results/Tests Pending at Time of Discharge: none  Discharge Medications:    Medication List  albuterol 108 (90 BASE) MCG/ACT inhaler  Commonly known as:  PROVENTIL HFA;VENTOLIN HFA  Inhale 2 puffs into the lungs every 6 (six) hours as needed for wheezing.     azithromycin 250 MG tablet  Commonly known as:  ZITHROMAX  TAKE 1 TAB DAILY FOR 3 DAYS     fluticasone 220 MCG/ACT inhaler   Commonly known as:  FLOVENT HFA  Inhale 2 puffs into the lungs 2 (two) times daily.     fluticasone 50 MCG/ACT nasal spray  Commonly known as:  FLONASE  2 sprays each nostril daily     fluticasone-salmeterol 45-21 MCG/ACT inhaler  Commonly known as:  ADVAIR HFA  Inhale 2 puffs into the lungs 2 (two) times daily.     loratadine 10 MG tablet  Commonly known as:  CLARITIN  Take 1 tablet (10 mg total) by mouth daily.     montelukast 10 MG tablet  Commonly known as:  SINGULAIR  Take 1 tablet (10 mg total) by mouth at bedtime.     polyvinyl alcohol 1.4 % ophthalmic solution  Commonly known as:  LIQUIFILM TEARS  Place 1 drop into both eyes as needed.     predniSONE 20 MG tablet  Commonly known as:  DELTASONE  Take 2 tablets (40 mg total) by mouth daily with breakfast.     prenatal multivitamin Tabs tablet  Take 1 tablet by mouth daily at 12 noon.     sodium chloride 0.65 % Soln nasal spray  Commonly known as:  OCEAN  Place 1 spray into the nose as needed for congestion.        Discharge Instructions: Please refer to Patient Instructions section of EMR for full details.  Patient was counseled important signs and symptoms that should prompt return to medical care, changes in medications, dietary instructions, activity restrictions, and follow up appointments.   Follow-Up Appointments:   Rolm Bookbinder, Med Student 10/25/2012, 11:54 AM  [facility]

## 2012-10-25 NOTE — Progress Notes (Signed)
FMTS Attending Daily Note:  Renold Don MD  [phone] pager  Family Practice pager:  [phone] I have seen and examined this patient and have reviewed their chart. I have discussed this patient with the resident. I agree with the resident's findings, assessment and care plan.  Additionally:  Patient much improved today. No O2 requirement.  Still with some wheezing, but able to talk in complete sentences and ambulate without dyspnea. Will send home with [facility] inhaler.  Need to find controller inhaler which Medicaid will cover Starting Olapatadine today as well.    Tobey Grim, MD 10/25/2012 2:15 PM

## 2012-10-25 NOTE — Discharge Summary (Signed)
Family Medicine Teaching Service  Discharge Note : Attending Renold Don MD Pager [phone] Inpatient Team Pager:  [phone]  I have reviewed this patient and the patient's chart and have discussed discharge planning with the medical student and supervising resident at the time of discharge. I agree with the discharge plan as above.   Please see my separate note today for discharge details.  Sent home with [facility] from hospital plus [place].

## 2012-10-25 NOTE — Progress Notes (Signed)
Pt being dc to home, pt given dc instructions, medications reviewed with pt, pt verbalized understanding, pt left via wheelchair, pt drive self to hospital, pt stable

## 2012-10-25 NOTE — Progress Notes (Addendum)
Family Medicine Teaching Service Daily Progress Note Intern Pager: [phone]  Patient name: [name] [name] Medical record number: [id number] Date of birth: 04/14/1985 Age: 27 y.o. Gender: female  Primary Care Provider: Carney Living, MD Consultants: none Code Status: full  Pt Overview and Major Events to Date:  Pt. Condition improving. Per patient, she is feeling much better. She feels like she can "move air more easily." She also describes rash as much improved, with decreased itching. No new complaints.  Assessment and Plan: [name] [name] is a 27 y.o. female presenting with shortness of breath and coughing X 2-3 days along with worsened allergy symptoms x 1 month. PMH is significant for moderate to severe asthma, 1st trimester of pregnancy, and eczema.   # Asthma exacerbation - improving. No O2 requirement. Cough present. Leukocytosis with no fever. Discharged 9/22 for the similar symptoms with CAP. S/p solumedrol in ED. Now treating as HCAP. Started ceftriaxone/azithromycin 10/24/12 for possible CAP, will forgo CXR given pregnancy. - DC tele - Continue Duonebs Q4 hours /Q2 hours prn  - Continue home flonase, claritin, singulair, and flovent  - continue  prednisone 40mg  daily  - O2 PRN  - DC CTX  # Rash/allergies - improved significantly since admission. Allergic shiners and eczematous changes. Diffuse excoriations. Primarily on arms and legs. On Claritin, flonase, singulair, and eye drops.  Possibly related to atopy of pregnancy (PUPP), also consider atopic dermatitis worsened by pregnancy. Per patient, she had similar rash exacerbation during third trimester of previous pregnancy that occurred during fall and winter months - Hydrocortisone cream for rash, continue to monitor  - Prednisone as above  #.Tachycardia - improving. HR now in 90s. Likely secondary to albuterol  - DC Tele   # Leukocytosis - improving from 20.1-> 19.8-> 14.3. Likely stress reaction and secondary to  steroids. AF.   - afebrile, continue to monitor for fever  # Pregnancy - 9 weeks per patient.  positive fetal heart tones 10/22, rate 150bpm. Pts OB contacted and updated on care.  - PNV  FEN/GI: tolerating PO. Saline lock IV  Prophylaxis: SQ lovenox  Disposition: Home today pending improvement in respiratory status and transition to PO antibiotics  Subjective: Still has cough. Pt. Feels much better, can "move air better." Otherwise doing well.  Objective: Temp:  [98.3 F (36.8 C)-98.5 F (36.9 C)] 98.4 F (36.9 C) (10/24 0531) Pulse Rate:  [94-107] 94 (10/24 0531) Resp:  [18-20] 18 (10/24 0531) BP: (106-128)/(58-75) 106/67 mmHg (10/24 0531) SpO2:  [95 %-98 %] 97 % (10/24 0858) Weight:  [162 lb 4.8 oz (73.619 kg)] 162 lb 4.8 oz (73.619 kg) (10/24 0531)  Physical Exam: General: no apparent distress, pleasant, resting comfortably in bed Cardiovascular: reg rhythm, reg rate, no murmur detected Respiratory: wheezing bilaterally in all lung fields, normal WOB. Speaks in full sentences, 97% on room air Extremities: no LE edema HEENT: sclera clear, clear rhinorrhea, decreased redness surrounding eyes Skin: Legs and arms (especialy antecubital fossa) with dryness, mild erythematous, maculopapular rash on arms and legs. Neuro: Awake, alert, no focal deficits, normal speech   Laboratory: Results for orders placed during the hospital encounter of 10/22/12 (from the past 24 hour(s))  GLUCOSE, CAPILLARY     Status: Abnormal   Collection Time    10/24/12  4:23 PM      Result Value Range   Glucose-Capillary 126 (*) 70 - 99 mg/dL    Results for ADRIANNE, SHACKLETON (MRN [id number]) as of 10/25/2012 07:57  Ref. Range 10/23/2012 14:15  Sodium Latest  Range: 135-145 mEq/L 141  Potassium Latest Range: 3.5-5.1 mEq/L 4.3  Chloride Latest Range: 96-112 mEq/L 108  CO2 Latest Range: 19-32 mEq/L 23  BUN Latest Range: 6-23 mg/dL 7  Creatinine Latest Range: 0.50-1.10 mg/dL 1.61  Calcium Latest Range:  8.4-10.5 mg/dL 8.9  GFR calc non Af Amer Latest Range: >90 mL/min >90  GFR calc Af Amer Latest Range: >90 mL/min >90  Glucose Latest Range: 70-99 mg/dL 096 (H)  Alkaline Phosphatase Latest Range: 39-117 U/L 66  Albumin Latest Range: 3.5-5.2 g/dL 3.3 (L)  AST Latest Range: 0-37 U/L 11  ALT Latest Range: 0-35 U/L 7  Total Protein Latest Range: 6.0-8.3 g/dL 6.5  Total Bilirubin Latest Range: 0.3-1.2 mg/dL 0.2 (L)    Rolm Bookbinder, OMS IV 10/25/2012 7:56 AM [phone]  ----------------------------------------------------------------------- Resident Addendum  Pt seen and discussed care plan w/ MS4 Moss. Above note addended where needed Pt stable for DC  Shelly Flatten, MD Family Medicine PGY-3 10/25/2012, 9:43 AM

## 2012-10-25 NOTE — Progress Notes (Signed)
Tele d/c'd per MD

## 2012-11-04 ENCOUNTER — Other Ambulatory Visit: Payer: Self-pay

## 2012-11-04 ENCOUNTER — Other Ambulatory Visit (HOSPITAL_COMMUNITY): Payer: Self-pay | Admitting: Family Medicine

## 2012-11-04 LAB — OB RESULTS CONSOLE GC/CHLAMYDIA
CHLAMYDIA, DNA PROBE: NEGATIVE
Gonorrhea: NEGATIVE

## 2012-11-04 LAB — OB RESULTS CONSOLE HIV ANTIBODY (ROUTINE TESTING): HIV: NONREACTIVE

## 2012-11-04 LAB — OB RESULTS CONSOLE RPR: RPR: NONREACTIVE

## 2012-11-04 LAB — OB RESULTS CONSOLE ABO/RH: RH Type: POSITIVE

## 2012-11-04 LAB — OB RESULTS CONSOLE HEPATITIS B SURFACE ANTIGEN: Hepatitis B Surface Ag: NEGATIVE

## 2012-11-04 LAB — OB RESULTS CONSOLE ANTIBODY SCREEN: Antibody Screen: NEGATIVE

## 2012-11-04 LAB — OB RESULTS CONSOLE RUBELLA ANTIBODY, IGM: Rubella: IMMUNE

## 2012-11-19 ENCOUNTER — Encounter (HOSPITAL_COMMUNITY): Payer: Self-pay | Admitting: Emergency Medicine

## 2012-11-19 ENCOUNTER — Emergency Department (HOSPITAL_COMMUNITY)
Admission: EM | Admit: 2012-11-19 | Discharge: 2012-11-20 | Disposition: A | Discharge status: Home / Self Care | Payer: Medicaid Other | Attending: Emergency Medicine | Admitting: Emergency Medicine

## 2012-11-19 DIAGNOSIS — J189 Pneumonia, unspecified organism: Secondary | ICD-10-CM | POA: Insufficient documentation

## 2012-11-19 DIAGNOSIS — Z87891 Personal history of nicotine dependence: Secondary | ICD-10-CM | POA: Insufficient documentation

## 2012-11-19 DIAGNOSIS — J45901 Unspecified asthma with (acute) exacerbation: Secondary | ICD-10-CM | POA: Insufficient documentation

## 2012-11-19 DIAGNOSIS — Z8659 Personal history of other mental and behavioral disorders: Secondary | ICD-10-CM | POA: Insufficient documentation

## 2012-11-19 DIAGNOSIS — IMO0002 Reserved for concepts with insufficient information to code with codable children: Secondary | ICD-10-CM | POA: Insufficient documentation

## 2012-11-19 DIAGNOSIS — Z872 Personal history of diseases of the skin and subcutaneous tissue: Secondary | ICD-10-CM | POA: Insufficient documentation

## 2012-11-19 DIAGNOSIS — Z8742 Personal history of other diseases of the female genital tract: Secondary | ICD-10-CM | POA: Insufficient documentation

## 2012-11-19 DIAGNOSIS — Z79899 Other long term (current) drug therapy: Secondary | ICD-10-CM | POA: Insufficient documentation

## 2012-11-19 NOTE — ED Notes (Addendum)
Pt. reports wheezing with dry cough unrelieved by home MDI onset 2 days ago . Pt. is [redacted] weeks pregnant ( G2P1) .

## 2012-11-19 NOTE — ED Notes (Signed)
Pt states 2 days ago she was outside racking leaves and believes it triggered asthma attack. Pt states she has been wheezing and coughing. Took inhaler and neb tx at home with no relief. Pt states she has had 3 flares ups in 3 months. Pt states she is [redacted] weeks Pregnant. Denies pain on arrival.

## 2012-11-20 ENCOUNTER — Emergency Department (HOSPITAL_COMMUNITY): Payer: Medicaid Other

## 2012-11-20 LAB — BASIC METABOLIC PANEL
BUN: 7 mg/dL (ref 6–23)
Calcium: 9.1 mg/dL (ref 8.4–10.5)
Chloride: 102 mEq/L (ref 96–112)
Creatinine, Ser: 0.43 mg/dL — ABNORMAL LOW (ref 0.50–1.10)
GFR calc Af Amer: >90 mL/min (ref >90)
GFR calc non Af Amer: >90 mL/min (ref >90)
Potassium: 3.3 mEq/L — ABNORMAL LOW (ref 3.5–5.1)
Sodium: 134 mEq/L — ABNORMAL LOW (ref 135–145)

## 2012-11-20 LAB — CBC WITH DIFFERENTIAL/PLATELET
Basophils Absolute: 0 10*3/uL (ref 0.0–0.1)
Basophils Relative: 0 % (ref 0–1)
Eosinophils Absolute: 0.2 10*3/uL (ref 0.0–0.7)
MCH: 33.7 pg (ref 26.0–34.0)
MCHC: 35.9 g/dL (ref 30.0–36.0)
Neutro Abs: 16.4 10*3/uL — ABNORMAL HIGH (ref 1.7–7.7)
Neutrophils Relative %: 91 % — ABNORMAL HIGH (ref 43–77)
Platelets: 423 10*3/uL — ABNORMAL HIGH (ref 150–400)
RDW: 13.3 % (ref 11.5–15.5)

## 2012-11-20 MED ORDER — PREDNISONE 50 MG PO TABS: 50.0000 mg | ORAL_TABLET | Freq: Every day | ORAL | Status: DC

## 2012-11-20 MED ORDER — ALBUTEROL (5 MG/ML) CONTINUOUS INHALATION SOLN
10.0000 mg/h | INHALATION_SOLUTION | Freq: Once | RESPIRATORY_TRACT | Status: AC
  Administered 2012-11-20: 10 mg/h via RESPIRATORY_TRACT

## 2012-11-20 MED ORDER — IPRATROPIUM BROMIDE 0.02 % IN SOLN
0.5000 mg | Freq: Once | RESPIRATORY_TRACT | Status: AC
  Administered 2012-11-20: 0.5 mg via RESPIRATORY_TRACT
  Filled 2012-11-20: qty 2.5

## 2012-11-20 MED ORDER — SODIUM CHLORIDE 0.9 % IV SOLN: Freq: Once | INTRAVENOUS | Status: DC

## 2012-11-20 MED ORDER — ALBUTEROL SULFATE (5 MG/ML) 0.5% IN NEBU
2.5000 mg | INHALATION_SOLUTION | Freq: Once | RESPIRATORY_TRACT | Status: AC
  Administered 2012-11-20: 2.5 mg via RESPIRATORY_TRACT
  Filled 2012-11-20: qty 0.5

## 2012-11-20 MED ORDER — SODIUM CHLORIDE 0.9 % IV BOLUS (SEPSIS)
1000.0000 mL | Freq: Once | INTRAVENOUS | Status: AC
  Administered 2012-11-20: 1000 mL via INTRAVENOUS

## 2012-11-20 MED ORDER — ALBUTEROL SULFATE (5 MG/ML) 0.5% IN NEBU: 5.0000 mg | INHALATION_SOLUTION | RESPIRATORY_TRACT | Status: DC | PRN

## 2012-11-20 MED ORDER — ALBUTEROL SULFATE HFA 108 (90 BASE) MCG/ACT IN AERS: 1.0000 | INHALATION_SPRAY | Freq: Four times a day (QID) | RESPIRATORY_TRACT | Status: DC | PRN

## 2012-11-20 MED ORDER — PREDNISONE 20 MG PO TABS
60.0000 mg | ORAL_TABLET | Freq: Once | ORAL | Status: AC
  Administered 2012-11-20: 60 mg via ORAL
  Filled 2012-11-20: qty 3

## 2012-11-20 MED ORDER — SODIUM CHLORIDE 0.9 % IV BOLUS (SEPSIS): 1000.0000 mL | Freq: Once | INTRAVENOUS | Status: DC

## 2012-11-20 NOTE — Care Management (Signed)
Preformed Peak Flow on Pt. 195 X3 with good effort.

## 2012-11-20 NOTE — Consult Note (Signed)
Family Medicine Teaching Service Consult Note Service Pager: [phone]  Patient name: [name] [name] Medical record number: [id number] Date of birth: [birth year] Age: 27 y.o. Gender: female  Primary Care Provider: Carney Living, MD  Chief Complaint: Shortness of breath  Assessment and Plan: [name] [name] is a 27 y.o. female presenting with SOB of two days duration. PMH is significant for Asthma, Pregnancy and recurrent left Breast Abscess.   #1. Asthma exacerbation: pt received bronchodilator, prednisone 60mg   and wheezing responded to treatment properly. Never O2 requirement. Ok to discharge home on Prednisone 60mg  for 5 days and home PRN bronchodilator treatment. Pt was instructed to f/u as outpatient on Friday. Symptoms of worsening condition were discussed with her and she was agreeable with plan. Recommended to avoid triggers.  #2 Leukocytosis: pt with recent hospitalization for asthma exacerbation and concerns for PNA. She received ceftriaxone and completed course of azithromycin. Pt's leukocyte count trended down in that occasion from 20 to 14. This time her WBC is 18. CXR with no areas of consolidation. While examining pt we found left breast abscess that is actively draining and most likely explanation for her WBC.   #3. 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 She has multiple scars in the area. Pt reports it has been oozing in the past days. No surrounded erythema, edema or systemic signs of infection (fever, chills, nausea, general malaise). Discussed with ED Physician who will assess for I&D. No antibiotic treatment indicated if successful drainage is preformed.   #4 Pregnancy. G2P1, with 13 wks pregnancy per LMP. Continue prenatal tab and scheduled f/u.   #5. Electrolyte disturbances. Most likely from albuterol treatment. Normal EKG. Pt able to PO adequately and this will correct electrolytes (no clear hypokalemia  but K shifted intracellular secondary to albuterol)  Disposition: Discharge home with close f/u.   History of Present Illness: [name] [name] is a 27 y.o. female presenting with 2 days of wheezing after being raking leaves on her yard. She was using her home inhalers only, but they were insufficient to control her symptoms. Denies fever, chills, general malaise or productive cough.   Review Of Systems: Per HPI with the following additions: chest tightness and wheezing, no pain.  Otherwise 12 point review of systems was performed and was unremarkable.  Patient Active Problem List   Diagnosis Date Noted  . Acute asthma exacerbation 10/23/2012  . Tachycardia 10/23/2012  . Leukocytosis 10/23/2012  . Allergic conjunctivitis of both eyes and rhinitis 10/14/2012  . Pregnancy 09/22/2012  . Mastitis, Left. 11/27/2011  . Depression 03/24/2011  . ASTHMA, PERSISTENT 03/01/2006  . ECZEMA, ATOPIC DERMATITIS 03/01/2006   Past Medical History: Past Medical History  Diagnosis Date  . Asthma   . Pregnant state, incidental   . Breast abscess   . Family history of anesthesia complication     Grandmother has difficulty waskin up.  . Depression     HX of - not on med n, doing well.  . Pneumonia     years ago has had a few times.  . Eczema    Past Surgical History: Past Surgical History  Procedure Laterality Date  . No past surgeries    . Incise and drain abcess      left breast  . Incision and drainage abscess Left 02/17/2012    Procedure:  DRAINAGEof recurrent breast abscess;  Surgeon: Shelly Rubenstein, MD;  Location: [facility] OR;  Service: General;  Laterality: Left;   Social  History: History  Substance Use Topics  . Smoking status: Former Smoker    Quit date: 06/22/2010  . Smokeless tobacco: Never Used  . Alcohol Use: No   Additional social history: has a 37 year old child at home and is happy with this pregnancy.  Please also refer to relevant sections of EMR.  Family History: Family  History  Problem Relation Age of Onset  . Healthy Mother   . Healthy Father   . Stroke Maternal Grandmother   . Stroke Paternal Grandfather    Allergies and Medications: Allergies  Allergen Reactions  . Doxycycline Other (See Comments)    Photo sensitivity   No current facility-administered medications on file prior to encounter.   Current Outpatient Prescriptions on File Prior to Encounter  Medication Sig Dispense Refill  . fluticasone (FLONASE) 50 MCG/ACT nasal spray 2 sprays each nostril daily  16 g  2  . loratadine (CLARITIN) 10 MG tablet Take 1 tablet (10 mg total) by mouth daily.  90 tablet  1  . montelukast (SINGULAIR) 10 MG tablet Take 1 tablet (10 mg total) by mouth at bedtime.  30 tablet  3  . Prenatal Vit-Fe Fumarate-FA (PRENATAL MULTIVITAMIN) TABS tablet Take 1 tablet by mouth daily at 12 noon.  30 tablet  11  . PROVENTIL HFA 108 (90 BASE) MCG/ACT inhaler INHALE 2 PUFFS INTO THE LUNGS EVERY 2 (TWO) HOURS AS NEEDED FOR WHEEZING OR SHORTNESS OF BREATH.  13.4 each  6    Objective: BP 109/63  Pulse 115  Temp(Src) 98.3 F (36.8 C) (Oral)  Resp 39  Ht 5' (1.524 m)  Wt 169 lb (76.658 kg)  BMI 33.01 kg/m2  SpO2 95%  LMP 08/29/2012 Exam: Gen: lying in bed comfortably and speaking in full sentences with no apparent distress.  HEENT: Moist mucous membranes. Neck supple. No adenopathies.  CV: Regular rate and rhythm, no murmurs rubs or gallops. PULM: Clear to auscultation with good air movement bilaterally. No wheezes/rales/rhonchi. Breasts: left breast with several scars around nipple. Area of induration of approximately 2cm on position 9 o clock of areola. When pressing  there is purulent material coming from a small skin opening. No surrounded erythema or edema. No axillary adenopathy. ABD: Soft, non tender, non distended, normal bowel sounds EXT: No edema Neuro: Alert and oriented x3. No focalization   Labs and Imaging: CBC BMET   Recent Labs Lab 11/20/12 0321   WBC 18.1*  HGB 13.5  HCT 37.6  PLT 423*    Recent Labs Lab 11/20/12 0321  NA 134*  K 3.3*  CL 102  CO2 21  BUN 7  CREATININE 0.43*  GLUCOSE 132*  CALCIUM 9.1     CXR: Lung findings which suggest asthma or atypical/airway infection.   Dayarmys Piloto de Criselda Peaches, MD 11/20/2012, 6:18 AM PGY-3, [facility] Family Medicine FPTS Intern pager: [phone], text pages welcome

## 2012-11-20 NOTE — ED Notes (Signed)
Respiratory at bedside.

## 2012-11-20 NOTE — ED Notes (Signed)
Respiratory notified for continuous neb tx

## 2012-11-20 NOTE — ED Provider Notes (Addendum)
CSN: [id number]     Arrival date & time 11/19/12  2305 History   First MD Initiated Contact with Patient 11/19/12 2354     Chief Complaint  Patient presents with  . Asthma   (Consider location/radiation/quality/duration/timing/severity/associated sxs/prior Treatment) HPI Comments: Pt comes in with cc of asthma. She is pregnant, about 13 weeks by date, and has hx of asthma. Pt reports that she has been having some wheezing, with non productive cough and dyspnea x 2 days. Pt has been taking her meds prescribed, not a smoker. Usual triggers include weather change, infection - and she denies any fevers. Pt has no hx DVT, PE. Denies chest pains at this time.  Patient is a 27 y.o. female presenting with asthma. The history is provided by the patient.  Asthma Associated symptoms include shortness of breath. Pertinent negatives include no chest pain, no abdominal pain and no headaches.    Past Medical History  Diagnosis Date  . Asthma   . Pregnant state, incidental   . Breast abscess   . Family history of anesthesia complication     Grandmother has difficulty waskin up.  . Depression     HX of - not on med n, doing well.  . Pneumonia     years ago has had a few times.  . Eczema    Past Surgical History  Procedure Laterality Date  . No past surgeries    . Incise and drain abcess      left breast  . Incision and drainage abscess Left 02/17/2012    Procedure:  DRAINAGEof recurrent breast abscess;  Surgeon: Shelly Rubenstein, MD;  Location: [facility] OR;  Service: General;  Laterality: Left;   Family History  Problem Relation Age of Onset  . Healthy Mother   . Healthy Father   . Stroke Maternal Grandmother   . Stroke Paternal Grandfather    History  Substance Use Topics  . Smoking status: Former Smoker    Quit date: 06/22/2010  . Smokeless tobacco: Never Used  . Alcohol Use: No   OB History   Grav Para Term Preterm Abortions TAB SAB Ect Mult Living   2 1  1      1      Review of  Systems  Constitutional: Negative for activity change.  Respiratory: Positive for cough, shortness of breath and wheezing. Negative for chest tightness.   Cardiovascular: Negative for chest pain.  Gastrointestinal: Negative for nausea, vomiting and abdominal pain.  Genitourinary: Negative for dysuria.  Musculoskeletal: Negative for neck pain.  Neurological: Negative for headaches.    Allergies  Doxycycline  Home Medications   Current Outpatient Rx  Name  Route  Sig  Dispense  Refill  . azithromycin (ZITHROMAX) 250 MG tablet      TAKE 1 TAB DAILY FOR 3 DAYS   3 each   0   . fluticasone (FLONASE) 50 MCG/ACT nasal spray      2 sprays each nostril daily   16 g   2   . fluticasone (FLOVENT HFA) 220 MCG/ACT inhaler   Inhalation   Inhale 2 puffs into the lungs 2 (two) times daily.   1 Inhaler   12   . fluticasone-salmeterol (ADVAIR HFA) 45-21 MCG/ACT inhaler   Inhalation   Inhale 2 puffs into the lungs 2 (two) times daily.   1 Inhaler   12   . loratadine (CLARITIN) 10 MG tablet   Oral   Take 1 tablet (10 mg total) by mouth daily.  90 tablet   1   . montelukast (SINGULAIR) 10 MG tablet   Oral   Take 1 tablet (10 mg total) by mouth at bedtime.   30 tablet   3   . olopatadine (PATANOL) 0.1 % ophthalmic solution   Both Eyes   Place 1 drop into both eyes 2 (two) times daily.   5 mL   12   . polyvinyl alcohol (LIQUIFILM TEARS) 1.4 % ophthalmic solution   Both Eyes   Place 1 drop into both eyes as needed.   15 mL   0   . predniSONE (DELTASONE) 20 MG tablet   Oral   Take 2 tablets (40 mg total) by mouth daily with breakfast.   6 tablet   0   . Prenatal Vit-Fe Fumarate-FA (PRENATAL MULTIVITAMIN) TABS tablet   Oral   Take 1 tablet by mouth daily at 12 noon.   30 tablet   11   . PROVENTIL HFA 108 (90 BASE) MCG/ACT inhaler      INHALE 2 PUFFS INTO THE LUNGS EVERY 2 (TWO) HOURS AS NEEDED FOR WHEEZING OR SHORTNESS OF BREATH.   13.4 each   6   . sodium  chloride (OCEAN) 0.65 % SOLN nasal spray   Nasal   Place 1 spray into the nose as needed for congestion.   1 Bottle   0    BP 103/75  Pulse 115  Temp(Src) 98.3 F (36.8 C) (Oral)  Resp 26  Ht 5' (1.524 m)  Wt 169 lb (76.658 kg)  BMI 33.01 kg/m2  SpO2 92%  LMP 08/29/2012 Physical Exam  Nursing note and vitals reviewed. Constitutional: She is oriented to person, place, and time. She appears well-developed and well-nourished.  HENT:  Head: Normocephalic and atraumatic.  Eyes: EOM are normal. Pupils are equal, round, and reactive to light.  Neck: Neck supple. No JVD present.  Cardiovascular: Normal rate, regular rhythm and normal heart sounds.   No murmur heard. Pulmonary/Chest: Effort normal. No respiratory distress. She has wheezes.  Abdominal: Soft. She exhibits no distension. There is no tenderness. There is no rebound and no guarding.  Neurological: She is alert and oriented to person, place, and time.  Skin: Skin is warm and dry.    ED Course  Procedures (including critical care time) Labs Review Labs Reviewed - No data to display Imaging Review No results found.  EKG Interpretation    Date/Time:  Tuesday November 19 2012 23:52:11 EST Ventricular Rate:  120 PR Interval:  159 QRS Duration: 85 QT Interval:  325 QTC Calculation: 459 R Axis:   81 Text Interpretation:  Sinus tachycardia Baseline wander in lead(s) V2 Confirmed by Faige Seely, MD, Iviana Blasingame ([id number]) on 11/20/2012 12:27:18 AM            MDM  No diagnosis found.  Pt comes in with cc of dyspnea, wheezing. She has hx of asthma, and hx is consistent with asthma - as her sx are acute. She was tachycardic during my evaluation - which i think is from asthma and pregnancy. PE considered in the ddx, but the pretest probability is low, since her sx are acute, there is no pleuritic chest pain, and her sx can be explained by more common condition, and likely condition, i.e. Asthma.  Screening ekg is unchanged  from before. Will give treatments and reassess.    Derwood Kaplan, MD 11/20/12 0034  2:53 AM Still wheezing a little bit, and has some rhonchus breath sounds. Xray ordered now. Will give  1 more treatment and reassess for admission.  Derwood Kaplan, MD 11/20/12 0253  4:23 AM Has leukocytosis on CBC, but CXR not showing a definite infiltrate, and lung exam improved. I am not convinced that this is a PNA, but cannot unequivocally rule it out either. If this is a PNA, she is a HCAP due to recent admission.  i have discussed the case with FM and they will come and see the patient and decide on the antibiotic regimen. Holding the AB for now.   Derwood Kaplan, MD 11/20/12 0451  6:35 AM FM evaluated the patient and want close fu. They dont want any CAP tx. Pt expressed to them about abscess on the breast. I walked in with a RN with intention to get the lesion drained, but patient states that the lesion is draining on it's own, she rather not get the lesion evaluated or drained. Understands that the abscess can get worse if not drained completely. She has been advised to use warm compresses qid. FM f.u, this week.  Derwood Kaplan, MD 11/20/12 [phone]

## 2012-11-20 NOTE — ED Notes (Signed)
Family practice MD at bedside.

## 2012-11-20 NOTE — Consult Note (Signed)
Attending Addendum  I discussed the ED discharge and follow up care  plan with Dr. Aviva Signs. I have reviewed the note and agree.    Dessa Phi, MD FAMILY MEDICINE TEACHING SERVICE

## 2012-12-01 ENCOUNTER — Encounter (HOSPITAL_COMMUNITY): Payer: Self-pay | Admitting: Emergency Medicine

## 2012-12-01 ENCOUNTER — Emergency Department (HOSPITAL_COMMUNITY)
Admission: EM | Admit: 2012-12-01 | Discharge: 2012-12-01 | Disposition: A | Discharge status: Home / Self Care | Payer: Medicaid Other | Attending: Emergency Medicine | Admitting: Emergency Medicine

## 2012-12-01 DIAGNOSIS — O91119 Abscess of breast associated with pregnancy, unspecified trimester: Secondary | ICD-10-CM | POA: Insufficient documentation

## 2012-12-01 DIAGNOSIS — Z79899 Other long term (current) drug therapy: Secondary | ICD-10-CM | POA: Insufficient documentation

## 2012-12-01 DIAGNOSIS — N611 Abscess of the breast and nipple: Secondary | ICD-10-CM

## 2012-12-01 DIAGNOSIS — Z8659 Personal history of other mental and behavioral disorders: Secondary | ICD-10-CM | POA: Insufficient documentation

## 2012-12-01 DIAGNOSIS — R000 Tachycardia, unspecified: Secondary | ICD-10-CM | POA: Insufficient documentation

## 2012-12-01 DIAGNOSIS — J45909 Unspecified asthma, uncomplicated: Secondary | ICD-10-CM | POA: Insufficient documentation

## 2012-12-01 DIAGNOSIS — IMO0002 Reserved for concepts with insufficient information to code with codable children: Secondary | ICD-10-CM | POA: Insufficient documentation

## 2012-12-01 DIAGNOSIS — Z87891 Personal history of nicotine dependence: Secondary | ICD-10-CM | POA: Insufficient documentation

## 2012-12-01 DIAGNOSIS — Z3490 Encounter for supervision of normal pregnancy, unspecified, unspecified trimester: Secondary | ICD-10-CM

## 2012-12-01 DIAGNOSIS — Z872 Personal history of diseases of the skin and subcutaneous tissue: Secondary | ICD-10-CM | POA: Insufficient documentation

## 2012-12-01 DIAGNOSIS — O9989 Other specified diseases and conditions complicating pregnancy, childbirth and the puerperium: Secondary | ICD-10-CM | POA: Insufficient documentation

## 2012-12-01 DIAGNOSIS — Z8701 Personal history of pneumonia (recurrent): Secondary | ICD-10-CM | POA: Insufficient documentation

## 2012-12-01 LAB — CBC WITH DIFFERENTIAL/PLATELET
Basophils Absolute: 0 10*3/uL (ref 0.0–0.1)
Basophils Relative: 0 % (ref 0–1)
Eosinophils Absolute: 0.4 10*3/uL (ref 0.0–0.7)
HCT: 38.2 % (ref 36.0–46.0)
Hemoglobin: 13.8 g/dL (ref 12.0–15.0)
Lymphocytes Relative: 16 % (ref 12–46)
MCHC: 36.1 g/dL — ABNORMAL HIGH (ref 30.0–36.0)
Monocytes Absolute: 0.6 10*3/uL (ref 0.1–1.0)
Monocytes Relative: 4 % (ref 3–12)
Neutro Abs: 12 10*3/uL — ABNORMAL HIGH (ref 1.7–7.7)
Neutrophils Relative %: 77 % (ref 43–77)
Platelets: 365 10*3/uL (ref 150–400)
RDW: 13.6 % (ref 11.5–15.5)
WBC: 15.6 10*3/uL — ABNORMAL HIGH (ref 4.0–10.5)

## 2012-12-01 MED ORDER — ACETAMINOPHEN-CODEINE #3 300-30 MG PO TABS: 1.0000 | ORAL_TABLET | Freq: Four times a day (QID) | ORAL | Status: DC | PRN

## 2012-12-01 NOTE — ED Notes (Addendum)
Pt c/o increasing pain to L breast abscess.  Pt states she has had 2 surgeries to drain L breast, but the infection keeps returning.  Pt is [redacted] weeks pregnant and states her HR is always 140.

## 2012-12-01 NOTE — ED Provider Notes (Signed)
CSN: [id number]     Arrival date & time 12/01/12  1446 History   First MD Initiated Contact with Patient 12/01/12 1704     Chief Complaint  Patient presents with  . breast abscess     [redacted] weeks pregnant   (Consider location/radiation/quality/duration/timing/severity/associated sxs/prior Treatment) The history is provided by the patient.   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 She states with the previous pregnancy she had an abscess in her left breast and has had had surgery on it twice. She states she always has a drainage through a tract. She states the tract closed up and breath became more swollen and painful. No fevers. No further drainage. She is [redacted] weeks pregnant. Past Medical History  Diagnosis Date  . Asthma   . Pregnant state, incidental   . Breast abscess   . Family history of anesthesia complication     Grandmother has difficulty waskin up.  . Depression     HX of - not on med n, doing well.  . Pneumonia     years ago has had a few times.  . Eczema    Past Surgical History  Procedure Laterality Date  . No past surgeries    . Incise and drain abcess      left breast  . Incision and drainage abscess Left 02/17/2012    Procedure:  DRAINAGEof recurrent breast abscess;  Surgeon: Shelly Rubenstein, MD;  Location: [facility] OR;  Service: General;  Laterality: Left;   Family History  Problem Relation Age of Onset  . Healthy Mother   . Healthy Father   . Stroke Maternal Grandmother   . Stroke Paternal Grandfather    History  Substance Use Topics  . Smoking status: Former Smoker    Quit date: 06/22/2010  . Smokeless tobacco: Never Used  . Alcohol Use: No   OB History   Grav Para Term Preterm Abortions TAB SAB Ect Mult Living   2 1  1      1      Review of Systems  Constitutional: Negative for fever and appetite change.  Respiratory: Negative for shortness of breath.   Cardiovascular: Negative for chest pain.  Gastrointestinal: Negative for  anal bleeding.  Genitourinary:       Pregnant  Skin: Positive for wound.    Allergies  Doxycycline  Home Medications   Current Outpatient Rx  Name  Route  Sig  Dispense  Refill  . albuterol (PROVENTIL HFA;VENTOLIN HFA) 108 (90 BASE) MCG/ACT inhaler   Inhalation   Inhale 1-2 puffs into the lungs every 6 (six) hours as needed for wheezing or shortness of breath.   1 Inhaler   0   . albuterol (PROVENTIL) (5 MG/ML) 0.5% nebulizer solution   Nebulization   Take 1 mL (5 mg total) by nebulization every 4 (four) hours as needed for wheezing or shortness of breath.   20 mL   12   . fluticasone (FLONASE) 50 MCG/ACT nasal spray      2 sprays each nostril daily   16 g   2   . loratadine (CLARITIN) 10 MG tablet   Oral   Take 1 tablet (10 mg total) by mouth daily.   90 tablet   1   . mometasone-formoterol (DULERA) 200-5 MCG/ACT AERO   Inhalation   Inhale 2 puffs into the lungs 2 (two) times daily.         . montelukast (SINGULAIR) 10 MG tablet   Oral  Take 1 tablet (10 mg total) by mouth at bedtime.   30 tablet   3   . Prenatal Vit-Fe Fumarate-FA (PRENATAL MULTIVITAMIN) TABS tablet   Oral   Take 1 tablet by mouth daily at 12 noon.   30 tablet   11   . acetaminophen-codeine (TYLENOL #3) 300-30 MG per tablet   Oral   Take 1-2 tablets by mouth every 6 (six) hours as needed for moderate pain.   15 tablet   0   . predniSONE (DELTASONE) 50 MG tablet   Oral   Take 1 tablet (50 mg total) by mouth daily.   5 tablet   0    BP 114/63  Pulse 104  Temp(Src) 97.9 F (36.6 C) (Oral)  Resp 16  Wt 165 lb 3.2 oz (74.934 kg)  SpO2 100%  LMP 08/29/2012 Physical Exam  Constitutional: She appears well-developed and well-nourished.  Cardiovascular:  tachycardia  Pulmonary/Chest: Effort normal and breath sounds normal.  Skin: Skin is warm.  Tenderness to left breast just upper medial area of nipple. Some firmness. No skin induration. Some fluctuance at site of previous  drainage.    ED Course  Apiration of blood/fluid Date/Time: 12/01/2012 6:46 PM Performed by: Benjiman Core R. Authorized by: Billee Cashing Consent: Verbal consent obtained. written consent not obtained. Risks and benefits: risks, benefits and alternatives were discussed Consent given by: patient Patient understanding: patient states understanding of the procedure being performed Patient consent: the patient's understanding of the procedure matches consent given Procedure consent: procedure consent matches procedure scheduled Relevant documents: relevant documents present and verified Required items: required blood products, implants, devices, and special equipment available Patient identity confirmed: verbally with patient and arm band Time out: Immediately prior to procedure a "time out" was called to verify the correct patient, procedure, equipment, support staff and site/side marked as required. Preparation: Patient was prepped and draped in the usual sterile fashion. Local anesthesia used: yes Anesthesia: local infiltration Local anesthetic: lidocaine 1% without epinephrine Anesthetic total: 2 ml Patient sedated: no Comments: Breast abscess aspirated through previous drainage site. Purulent fluid  collected and was sent for culture. Irrigation of fluid collection with remaining 2 cc of lidocaine.    (including critical care time) Labs Review Labs Reviewed  CBC WITH DIFFERENTIAL - Abnormal; Notable for the following:    WBC 15.6 (*)    MCHC 36.1 (*)    Neutro Abs 12.0 (*)    All other components within normal limits  WOUND CULTURE   Imaging Review No results found.  EKG Interpretation   None       MDM   1. Breast abscess   2. Pregnant    [place] pregnant with breast abscess. Previous history of same with some chronic abscess. Previous tract was reopened with needle aspiration of purulence. Sent for culture. Patient will continue warm soaks. Patient's  had 3 separate cultures have not grown out a specific bacteria. Will hold on antibiotics at this time and will be followed by surgery and PCP    Juliet Rude. Rubin Payor, MD 12/01/12 [phone]

## 2012-12-04 LAB — WOUND CULTURE

## 2013-01-01 ENCOUNTER — Telehealth: Payer: Self-pay | Admitting: Family Medicine

## 2013-01-01 NOTE — Telephone Encounter (Signed)
Refill request for Prednisone.

## 2013-01-02 ENCOUNTER — Observation Stay (HOSPITAL_COMMUNITY)
Admission: EM | Admit: 2013-01-02 | Discharge: 2013-01-04 | Disposition: A | Discharge status: Home / Self Care | Payer: Medicaid Other | Attending: Family Medicine | Admitting: Family Medicine

## 2013-01-02 ENCOUNTER — Encounter (HOSPITAL_COMMUNITY): Payer: Self-pay | Admitting: Emergency Medicine

## 2013-01-02 ENCOUNTER — Emergency Department (HOSPITAL_COMMUNITY): Payer: Medicaid Other

## 2013-01-02 DIAGNOSIS — Z331 Pregnant state, incidental: Secondary | ICD-10-CM

## 2013-01-02 DIAGNOSIS — D72829 Elevated white blood cell count, unspecified: Secondary | ICD-10-CM

## 2013-01-02 DIAGNOSIS — Z3490 Encounter for supervision of normal pregnancy, unspecified, unspecified trimester: Secondary | ICD-10-CM

## 2013-01-02 DIAGNOSIS — J45901 Unspecified asthma with (acute) exacerbation: Secondary | ICD-10-CM | POA: Diagnosis present

## 2013-01-02 DIAGNOSIS — O9989 Other specified diseases and conditions complicating pregnancy, childbirth and the puerperium: Principal | ICD-10-CM

## 2013-01-02 DIAGNOSIS — O99891 Other specified diseases and conditions complicating pregnancy: Principal | ICD-10-CM | POA: Insufficient documentation

## 2013-01-02 DIAGNOSIS — Z87891 Personal history of nicotine dependence: Secondary | ICD-10-CM | POA: Insufficient documentation

## 2013-01-02 DIAGNOSIS — L259 Unspecified contact dermatitis, unspecified cause: Secondary | ICD-10-CM | POA: Insufficient documentation

## 2013-01-02 DIAGNOSIS — L2089 Other atopic dermatitis: Secondary | ICD-10-CM

## 2013-01-02 DIAGNOSIS — R000 Tachycardia, unspecified: Secondary | ICD-10-CM

## 2013-01-02 LAB — BASIC METABOLIC PANEL WITH GFR
BUN: 6 mg/dL (ref 6–23)
CO2: 19 meq/L (ref 19–32)
Calcium: 8.3 mg/dL — ABNORMAL LOW (ref 8.4–10.5)
Chloride: 104 meq/L (ref 96–112)
Creatinine, Ser: 0.47 mg/dL — ABNORMAL LOW (ref 0.50–1.10)
GFR calc Af Amer: >90 mL/min
GFR calc non Af Amer: >90 mL/min
Glucose, Bld: 156 mg/dL — ABNORMAL HIGH (ref 70–99)
Potassium: 3.2 meq/L — ABNORMAL LOW (ref 3.7–5.3)
Sodium: 138 meq/L (ref 137–147)

## 2013-01-02 LAB — CBC
HEMATOCRIT: 35.2 % — AB (ref 36.0–46.0)
HEMOGLOBIN: 12.3 g/dL (ref 12.0–15.0)
MCH: 33.1 pg (ref 26.0–34.0)
MCHC: 34.9 g/dL (ref 30.0–36.0)
MCV: 94.6 fL (ref 78.0–100.0)
PLATELETS: 370 10*3/uL (ref 150–400)
RBC: 3.72 MIL/uL — AB (ref 3.87–5.11)
RDW: 13.4 % (ref 11.5–15.5)
WBC: 18.1 10*3/uL — ABNORMAL HIGH (ref 4.0–10.5)

## 2013-01-02 MED ORDER — IPRATROPIUM BROMIDE 0.02 % IN SOLN: 0.5000 mg | Freq: Once | RESPIRATORY_TRACT | Status: DC

## 2013-01-02 MED ORDER — SODIUM CHLORIDE 0.9 % IJ SOLN
3.0000 mL | Freq: Two times a day (BID) | INTRAMUSCULAR | Status: DC
Start: 2013-01-02 — End: 2013-01-03
  Administered 2013-01-02 – 2013-01-03 (×3): 3 mL via INTRAVENOUS

## 2013-01-02 MED ORDER — PREDNISONE 20 MG PO TABS
40.0000 mg | ORAL_TABLET | Freq: Once | ORAL | Status: AC
  Administered 2013-01-02: 40 mg via ORAL
  Filled 2013-01-02: qty 2

## 2013-01-02 MED ORDER — ALBUTEROL SULFATE (2.5 MG/3ML) 0.083% IN NEBU
5.0000 mg | INHALATION_SOLUTION | RESPIRATORY_TRACT | Status: AC
  Administered 2013-01-02 – 2013-01-03 (×2): 5 mg via RESPIRATORY_TRACT
  Filled 2013-01-02 (×3): qty 6

## 2013-01-02 MED ORDER — SODIUM CHLORIDE 0.9 % IJ SOLN: 3.0000 mL | INTRAMUSCULAR | Status: DC | PRN

## 2013-01-02 MED ORDER — LORATADINE 10 MG PO TABS
10.0000 mg | ORAL_TABLET | Freq: Every day | ORAL | Status: DC
  Administered 2013-01-02 – 2013-01-04 (×3): 10 mg via ORAL
  Filled 2013-01-02 (×4): qty 1

## 2013-01-02 MED ORDER — MOMETASONE FURO-FORMOTEROL FUM 200-5 MCG/ACT IN AERO
2.0000 | INHALATION_SPRAY | Freq: Two times a day (BID) | RESPIRATORY_TRACT | Status: DC
  Administered 2013-01-03 – 2013-01-04 (×3): 2 via RESPIRATORY_TRACT
  Filled 2013-01-02 (×2): qty 8.8

## 2013-01-02 MED ORDER — ALBUTEROL SULFATE HFA 108 (90 BASE) MCG/ACT IN AERS
1.0000 | INHALATION_SPRAY | RESPIRATORY_TRACT | Status: DC | PRN
  Administered 2013-01-02 – 2013-01-04 (×8): 2 via RESPIRATORY_TRACT
  Filled 2013-01-02 (×3): qty 6.7

## 2013-01-02 MED ORDER — IPRATROPIUM BROMIDE 0.02 % IN SOLN
0.5000 mg | Freq: Once | RESPIRATORY_TRACT | Status: AC
  Administered 2013-01-02: 0.5 mg via RESPIRATORY_TRACT
  Filled 2013-01-02: qty 2.5

## 2013-01-02 MED ORDER — ALBUTEROL SULFATE (2.5 MG/3ML) 0.083% IN NEBU: 5.0000 mg | INHALATION_SOLUTION | RESPIRATORY_TRACT | Status: DC | PRN

## 2013-01-02 MED ORDER — HYDROCERIN EX CREA
TOPICAL_CREAM | Freq: Two times a day (BID) | CUTANEOUS | Status: DC
  Administered 2013-01-02 – 2013-01-04 (×4): via TOPICAL
  Filled 2013-01-02: qty 113

## 2013-01-02 MED ORDER — PRENATAL MULTIVITAMIN CH
1.0000 | ORAL_TABLET | Freq: Every day | ORAL | Status: DC
  Administered 2013-01-03 – 2013-01-04 (×2): 1 via ORAL
  Filled 2013-01-02 (×2): qty 1

## 2013-01-02 MED ORDER — ALBUTEROL SULFATE (2.5 MG/3ML) 0.083% IN NEBU
5.0000 mg | INHALATION_SOLUTION | Freq: Once | RESPIRATORY_TRACT | Status: AC
  Administered 2013-01-02: 5 mg via RESPIRATORY_TRACT
  Filled 2013-01-02: qty 6

## 2013-01-02 MED ORDER — ALBUTEROL SULFATE (2.5 MG/3ML) 0.083% IN NEBU: 5.0000 mg | INHALATION_SOLUTION | Freq: Once | RESPIRATORY_TRACT | Status: DC

## 2013-01-02 MED ORDER — SODIUM CHLORIDE 0.9 % IV SOLN: 250.0000 mL | INTRAVENOUS | Status: DC | PRN

## 2013-01-02 MED ORDER — FLUTICASONE PROPIONATE 50 MCG/ACT NA SUSP
1.0000 | Freq: Every day | NASAL | Status: DC
  Administered 2013-01-02 – 2013-01-04 (×3): 1 via NASAL
  Filled 2013-01-02 (×2): qty 16

## 2013-01-02 MED ORDER — IPRATROPIUM BROMIDE 0.02 % IN SOLN
0.5000 mg | RESPIRATORY_TRACT | Status: DC
  Administered 2013-01-02 – 2013-01-04 (×11): 0.5 mg via RESPIRATORY_TRACT
  Filled 2013-01-02 (×9): qty 2.5

## 2013-01-02 MED ORDER — POTASSIUM CHLORIDE CRYS ER 20 MEQ PO TBCR
40.0000 meq | EXTENDED_RELEASE_TABLET | ORAL | Status: AC
  Administered 2013-01-02 (×2): 40 meq via ORAL
  Filled 2013-01-02 (×3): qty 2

## 2013-01-02 MED ORDER — BENZONATATE 100 MG PO CAPS
100.0000 mg | ORAL_CAPSULE | Freq: Three times a day (TID) | ORAL | Status: DC
  Administered 2013-01-02 – 2013-01-04 (×5): 100 mg via ORAL
  Filled 2013-01-02 (×7): qty 1

## 2013-01-02 MED ORDER — MONTELUKAST SODIUM 10 MG PO TABS
10.0000 mg | ORAL_TABLET | Freq: Every day | ORAL | Status: DC
  Administered 2013-01-02 – 2013-01-03 (×2): 10 mg via ORAL
  Filled 2013-01-02 (×3): qty 1

## 2013-01-02 MED ORDER — HYDROCORTISONE 0.5 % EX CREA
TOPICAL_CREAM | Freq: Two times a day (BID) | CUTANEOUS | Status: DC
  Administered 2013-01-02 – 2013-01-04 (×4): via TOPICAL
  Filled 2013-01-02: qty 28.35

## 2013-01-02 NOTE — ED Notes (Signed)
Patient transported to X-ray 

## 2013-01-02 NOTE — ED Notes (Signed)
PA at bedside.

## 2013-01-02 NOTE — ED Provider Notes (Signed)
CSN: [id number]     Arrival date & time 01/02/13  1010 History   First MD Initiated Contact with Patient 01/02/13 1108     Chief Complaint  Patient presents with  . Asthma   (Consider location/radiation/quality/duration/timing/severity/associated sxs/prior Treatment) HPI Comments: Benson NorwayCourtney Kilby is a 28 year-old female with a past medical history of pregnancy 19 weeks, asthma, pneumonia, presenting the Emergency Department with a chief complaint of asthma for three days.  She reports wheezing at rest, productive cough, and chest tightness.  She reports taking her albuterol inhaler without relief.  She also states that she has tried several nebulized breathing treatments without relief .   Patient is a 28 y.o. female presenting with asthma. The history is provided by the patient and medical records. No language interpreter was used.  Asthma Associated symptoms include coughing. Pertinent negatives include no abdominal pain, chills, fever, nausea or vomiting.    Past Medical History  Diagnosis Date  . Asthma   . Pregnant state, incidental   . Breast abscess   . Family history of anesthesia complication     Grandmother has difficulty waskin up.  . Depression     HX of - not on med n, doing well.  . Pneumonia     years ago has had a few times.  . Eczema    Past Surgical History  Procedure Laterality Date  . No past surgeries    . Incise and drain abcess      left breast  . Incision and drainage abscess Left 02/17/2012    Procedure:  DRAINAGEof recurrent breast abscess;  Surgeon: Shelly Rubensteinouglas A Blackman, MD;  Location: [facility] OR;  Service: General;  Laterality: Left;   Family History  Problem Relation Age of Onset  . Healthy Mother   . Healthy Father   . Stroke Maternal Grandmother   . Stroke Paternal Grandfather    History  Substance Use Topics  . Smoking status: Former Smoker    Quit date: 06/22/2010  . Smokeless tobacco: Never Used  . Alcohol Use: No   OB History   Grav Para  Term Preterm Abortions TAB SAB Ect Mult Living   2 1  1      1      Review of Systems  Constitutional: Negative for fever and chills.  Respiratory: Positive for cough, chest tightness and wheezing.   Cardiovascular: Negative for leg swelling.  Gastrointestinal: Negative for nausea, vomiting and abdominal pain.    Allergies  Doxycycline  Home Medications   Current Outpatient Rx  Name  Route  Sig  Dispense  Refill  . acetaminophen-codeine (TYLENOL #3) 300-30 MG per tablet   Oral   Take 1-2 tablets by mouth every 6 (six) hours as needed for moderate pain.   15 tablet   0   . albuterol (PROVENTIL HFA;VENTOLIN HFA) 108 (90 BASE) MCG/ACT inhaler   Inhalation   Inhale 1-2 puffs into the lungs every 6 (six) hours as needed for wheezing or shortness of breath.   1 Inhaler   0   . albuterol (PROVENTIL) (5 MG/ML) 0.5% nebulizer solution   Nebulization   Take 1 mL (5 mg total) by nebulization every 4 (four) hours as needed for wheezing or shortness of breath.   20 mL   12   . fluticasone (FLONASE) 50 MCG/ACT nasal spray      2 sprays each nostril daily   16 g   2   . loratadine (CLARITIN) 10 MG tablet   Oral  Take 1 tablet (10 mg total) by mouth daily.   90 tablet   1   . mometasone-formoterol (DULERA) 200-5 MCG/ACT AERO   Inhalation   Inhale 2 puffs into the lungs 2 (two) times daily.         . montelukast (SINGULAIR) 10 MG tablet   Oral   Take 1 tablet (10 mg total) by mouth at bedtime.   30 tablet   3   . Prenatal Vit-Fe Fumarate-FA (PRENATAL MULTIVITAMIN) TABS tablet   Oral   Take 1 tablet by mouth daily at 12 noon.   30 tablet   11    BP 106/46  Pulse 122  Temp(Src) 98 F (36.7 C) (Oral)  Resp 22  Ht 5' (1.524 m)  Wt 168 lb (76.204 kg)  BMI 32.81 kg/m2  SpO2 96%  LMP 08/29/2012 Physical Exam  Nursing note and vitals reviewed. Constitutional: She appears well-developed and well-nourished.  Cardiovascular: Regular rhythm.  Tachycardia present.    Pulmonary/Chest: She has wheezes. She has rhonchi in the right upper field, the right middle field, the right lower field, the left upper field and the left middle field.  Inspiratory and Expiratory wheezing heard bilaterally.    ED Course  Procedures (including critical care time) Labs Review Labs Reviewed  CBC - Abnormal; Notable for the following:    WBC 18.1 (*)    RBC 3.72 (*)    HCT 35.2 (*)    All other components within normal limits  BASIC METABOLIC PANEL - Abnormal; Notable for the following:    Potassium 3.2 (*)    Glucose, Bld 156 (*)    Creatinine, Ser 0.47 (*)    Calcium 8.3 (*)    All other components within normal limits   Imaging Review Dg Chest 2 View  01/02/2013   CLINICAL DATA:  Cough and shortness of Breath.  EXAM: CHEST  2 VIEW  COMPARISON:  11/20/2012.  FINDINGS: The cardiac silhouette, mediastinal and hilar contours are within normal limits and stable. There is mild peribronchial thickening and increased interstitial markings suggesting reactive airways disease or bronchitis. No focal infiltrates or effusions. The bony thorax is intact.  IMPRESSION: Findings suggest bronchitis or reactive airways disease.   Electronically Signed   By: Loralie Champagne [name]   On: 01/02/2013 16:28    EKG Interpretation   None       MDM   1. Asthma exacerbation   2. Pregnancy    Pt with a history of asthma reports asthma exacerbation for 3 day.  Wheezing heard throughout lung fields.  Re-eval patient reports symptom improvement.  Mild expiratory wheezing.  Will order another neb treatment. Re-eval patient reports cough improvement.  Mild expiratory wheezing. Discussed patient history, condition, and labs with Dr. Karma Ganja who advises obtaining blood work prior to XR or admission.  XR Findings suggest bronchitis or reactive airways disease. Discussed patient history, condition, and treatment administered in the ED without relief of symptoms with Dr. Cliffton Asters who will admit the  patient to Dr. Cyndia Skeeters service. Discussed lab results, imaging results, and treatment plan with the patient. Reports understanding and no other concerns at this time.  Meds given in ED:   albuterol (PROVENTIL) (2.5 MG/3ML) 0.083% nebulizer solution 5 mg Once  predniSONE (DELTASONE) tablet 40 mg Once  ipratropium (ATROVENT) nebulizer solution 0.5 mg Once  ipratropium (ATROVENT) nebulizer solution 0.5 mg Once  albuterol (PROVENTIL) (2.5 MG/3ML) 0.083% nebulizer solution 5 mg Once  ipratropium (ATROVENT) nebulizer solution 0.5 mg Once  albuterol (PROVENTIL) (2.5 MG/3ML) 0.083% nebulizer solution 5 mg Once        Clabe Seal, PA-C 01/03/13 1554

## 2013-01-02 NOTE — ED Notes (Signed)
Pt c/o asthma symptoms x 3 days. Pt has tried inhaler and neb treatment without relief. Pt last had neb treatment at 0930.

## 2013-01-02 NOTE — L&D Delivery Note (Signed)
Delivery Note At 8:36 AM a viable and healthy female was delivered via Vaginal, Spontaneous Delivery (Presentation: Right Occiput Anterior).  APGAR: 9, 9; weight P.   Placenta status: Intact, Spontaneous.  Cord: 2 vessels with the following complications: None.    Anesthesia: Epidural  Episiotomy: None Lacerations: None Suture Repair: N/A Est. Blood Loss (mL): 400cc  Mom to postpartum.  Baby to Couplet care / Skin to Skin.  [name] [name] 05/09/2013, 8:51 AM  Bo/RI/A+/Contra _ BTL interval

## 2013-01-02 NOTE — Progress Notes (Signed)
NURSING PROGRESS NOTE  [name] [name] [id number] Admission Data: 01/02/2013 7:54 PM Attending Provider: Sanjuana LettersWilliam Arthur Hensel, MD WUJ:WJXBJYNWG,NFAOZHYQPCP:CHAMBLISS,MARSHALL L, MD Code Status: Full   [name] [name] is a 28 y.o. female patient admitted from ED:  -No acute distress noted.  -No complaints of shortness of breath.  -No complaints of chest pain.   Blood pressure 118/53, pulse 117, temperature 98.4 F (36.9 C), temperature source Oral, resp. rate 22, height 5' (1.524 m), weight 76.1 kg (167 lb 12.3 oz), last menstrual period 08/29/2012, SpO2 97.00%.   IV Fluids:  IV in place, occlusive dsg intact without redness, IV cath.   Allergies:  Doxycycline  Past Medical History:   has a past medical history of Asthma; Pregnant state, incidental; Breast abscess; Family history of anesthesia complication; Depression; Pneumonia; and Eczema.  Past Surgical History:   has past surgical history that includes No past surgeries; Incise and drain abcess; and Incision and drainage abscess (Left, 02/17/2012).  Social History:   reports that she quit smoking about 2 years ago. She has never used smokeless tobacco. She reports that she does not drink alcohol or use illicit drugs.  Skin: Tattoo to left lower arm, eczema.  Patient/Family orientated to room. Information packet given to patient/family. Admission inpatient armband information verified with patient/family to include name and date of birth and placed on patient arm. Side rails up x 2, fall assessment and education completed with patient/family. Patient/family able to verbalize understanding of risk associated with falls and verbalized understanding to call for assistance before getting out of bed. Call light within reach. Patient/family able to voice and demonstrate understanding of unit orientation instructions.    Will continue to evaluate and treat per MD orders.

## 2013-01-02 NOTE — ED Notes (Signed)
Internal medicine MD at bedside

## 2013-01-02 NOTE — H&P (Signed)
[facility] Physical Service Pager: [phone]  Patient name: [name] [name] Medical record number: [id number] Date of birth: [birth year] Age: 28 y.o. Gender: female  Primary Care Provider: Carney LivingHAMBLISS,MARSHALL L, MD Consultants: none Code Status: Full  Chief Complaint: wheezing  Assessment and Plan: [name] [name] is a 28 y.o. female G2P0101 at 6965w2d presenting with acute asthma exacerbation for past 3 days in the setting of recent URI. PMH is significant for asthma with 2 recent hospitalizations for exacerbation (September and October), eczema, depression.  # Asthma exacerbation: current episode lasting 3 days despite home nebs/inhaler and 3 treatments duonebs in ED. Recent viral URI likely causing exacerbation, in addition to not taking any controller meds. CXR suggestive of bronchitis/reactive airway disease, no evidence for pneumonia. WBC at 18.1 but CBC drawn after prednisone - admit to observation, attending Dr. Leveda AnnaHensel - scheduled duonebs q4hrs, albuterol q2hrs PRN - prednisone 40mg  daily x 4 more days - supplemental O2 by [place] to keep sat >92% - continue home singulair, claritin, flonase - dulera 2 puffs BID - tessalon perle 100mg  TID for cough  # Eczema: very mild along forearms - prednisone per above - eucerin cream as needed - hydrocortisone 0.5% cream BID  # Current pregnancy - Doing well - FHR - 150's by hand-held doppler - No reports of loss of fluid, vaginal bleeding, contractions or vaginal discharge.   FEN/GI: reg diet, saline lock Prophylaxis: hold pharmacologic, early ambulation  Disposition: admit to observation  History of Present Illness: [name] [name] is a 28 y.o. female presenting with 3 day history of asthma exacerbation. She thinks it started after her child gave her a cold. This is the 3rd time she has had an asthma exacerbation during this pregnancy; she normally says her asthma is okay, but during her  last pregnancy she also had worsened asthma. She has not been taking any controller medications recently, she wasn't sure if she was supposed to be taking advair or flovent. She has had consistent wheezing, non-productive cough, sneezing. She has been using her home inhaler and nebulizer 10-15 times a day without relief, and today before coming to ED 4-5 neb treatments. She has a sore chest from coughing and itching on her skin from eczema (she thought it might be scabies). She felt feverish but did not measure any temperatures. No nausea/vomiting.  [facility]: Dr. Ellyn HackBovard. No complications so far, no vaginal bleeding or discharge, no contractions. She still feels the baby kicking today.  Review Of Systems: Per HPI with the following additions: none Otherwise 12 point review of systems was performed and was unremarkable.  Patient Active Problem List   Diagnosis Date Noted  . Asthma exacerbation 01/02/2013  . Acute asthma exacerbation 10/23/2012  . Tachycardia 10/23/2012  . Leukocytosis 10/23/2012  . Allergic conjunctivitis of both eyes and rhinitis 10/14/2012  . Pregnancy 09/22/2012  . Mastitis, Left. 11/27/2011  . Depression 03/24/2011  . ASTHMA, PERSISTENT 03/01/2006  . ECZEMA, ATOPIC DERMATITIS 03/01/2006   Past Medical History: Past Medical History  Diagnosis Date  . Asthma   . Pregnant state, incidental   . Breast abscess   . Family history of anesthesia complication     Grandmother has difficulty waskin up.  . Depression     HX of - not on med n, doing well.  . Pneumonia     years ago has had a few times.  . Eczema    Past Surgical History: Past Surgical History  Procedure Laterality Date  . No past surgeries    . Incise and drain abcess      left breast  . Incision and drainage abscess Left 02/17/2012    Procedure:  DRAINAGEof recurrent breast abscess;  Surgeon: Shelly Rubenstein, MD;  Location: [facility] OR;  Service: General;   Laterality: Left;   Social History: History  Substance Use Topics  . Smoking status: Former Smoker    Quit date: 06/22/2010  . Smokeless tobacco: Never Used  . Alcohol Use: No   Additional social history: none Please also refer to relevant sections of EMR.  Family History: Family History  Problem Relation Age of Onset  . Healthy Mother   . Healthy Father   . Stroke Maternal Grandmother   . Stroke Paternal Grandfather    Allergies and Medications: Allergies  Allergen Reactions  . Doxycycline Other (See Comments)    Photo sensitivity   No current facility-administered medications on file prior to encounter.   Current Outpatient Prescriptions on File Prior to Encounter  Medication Sig Dispense Refill  . acetaminophen-codeine (TYLENOL #3) 300-30 MG per tablet Take 1-2 tablets by mouth every 6 (six) hours as needed for moderate pain.  15 tablet  0  . albuterol (PROVENTIL HFA;VENTOLIN HFA) 108 (90 BASE) MCG/ACT inhaler Inhale 1-2 puffs into the lungs every 6 (six) hours as needed for wheezing or shortness of breath.  1 Inhaler  0  . albuterol (PROVENTIL) (5 MG/ML) 0.5% nebulizer solution Take 1 mL (5 mg total) by nebulization every 4 (four) hours as needed for wheezing or shortness of breath.  20 mL  12  . fluticasone (FLONASE) 50 MCG/ACT nasal spray 2 sprays each nostril daily  16 g  2  . loratadine (CLARITIN) 10 MG tablet Take 1 tablet (10 mg total) by mouth daily.  90 tablet  1  . mometasone-formoterol (DULERA) 200-5 MCG/ACT AERO Inhale 2 puffs into the lungs 2 (two) times daily.      . montelukast (SINGULAIR) 10 MG tablet Take 1 tablet (10 mg total) by mouth at bedtime.  30 tablet  3  . Prenatal Vit-Fe Fumarate-FA (PRENATAL MULTIVITAMIN) TABS tablet Take 1 tablet by mouth daily at 12 noon.  30 tablet  11    Objective: BP 106/46  Pulse 122  Temp(Src) 98 F (36.7 C) (Oral)  Resp 22  Ht 5' (1.524 m)  Wt 168 lb (76.204 kg)  BMI 32.81 kg/m2  SpO2 96%  LMP  08/29/2012 Exam: General: NAD, sitting in bed HEENT: PERRL, EOMI, MMM Cardiovascular: Tachycardic, but regular. Normal heart sounds, no m/r/g Respiratory: Diffuse expiratory wheezes throughout all lung fields, effort normal. Abdomen: soft, gravid, NTTP Extremities: no edema or cyanosis. Skin: warm and dry. mild eczema along both forearms Neuro: A+Ox4, no focal neuro deficits.  Fetal HR: 150s  Labs and Imaging: CBC BMET   Recent Labs Lab 01/02/13 1508  WBC 18.1*  HGB 12.3  HCT 35.2*  PLT 370    Recent Labs Lab 01/02/13 1508  NA 138  K 3.2*  CL 104  CO2 19  BUN 6  CREATININE 0.47*  GLUCOSE 156*  CALCIUM 8.3*     1/1 2v CXR: FINDINGS:  The cardiac silhouette, mediastinal and hilar contours are within  normal limits and stable. There is mild peribronchial thickening and  increased interstitial markings suggesting reactive airways disease  or bronchitis. No focal infiltrates or effusions. The bony thorax is  intact.  IMPRESSION:  Findings suggest bronchitis or reactive airways disease.  Tawni Carnes, MD 01/02/2013, 4:37 PM PGY-1, [facility] FPTS Intern pager: [phone], text pages welcome  I have seen the above patient and agree with the documentation.  Addendum is in blue.  Everlene Other DO Family Medicine PGY-2

## 2013-01-03 DIAGNOSIS — L2089 Other atopic dermatitis: Secondary | ICD-10-CM

## 2013-01-03 DIAGNOSIS — R000 Tachycardia, unspecified: Secondary | ICD-10-CM

## 2013-01-03 MED ORDER — DM-GUAIFENESIN ER 30-600 MG PO TB12
1.0000 | ORAL_TABLET | Freq: Two times a day (BID) | ORAL | Status: DC | PRN
  Administered 2013-01-04: 1 via ORAL
  Filled 2013-01-03 (×2): qty 1

## 2013-01-03 MED ORDER — MENTHOL 3 MG MT LOZG
1.0000 | LOZENGE | OROMUCOSAL | Status: DC | PRN
  Administered 2013-01-03: 3 mg via ORAL
  Filled 2013-01-03: qty 9

## 2013-01-03 NOTE — H&P (Signed)
Seen and examined.  Discussed with Dr. Adriana Simasook.  Agree with his management and documentation.  Briefly, 28 yo female with asthma and 19 week pregnancy presents with an asthma exacerbation, almost certainly due to a viral illness that her other two children brought home.  She still feels tight and mildly SOB this am.  Will continue current meds.  I hope she will be improved sufficiently to be DCed on 1/3

## 2013-01-03 NOTE — Progress Notes (Signed)
Utilization review completed. Alyssia Heese, RN, BSN. 

## 2013-01-03 NOTE — Progress Notes (Signed)
Seen and examined.  Discussed with Dr. Jarvis NewcomerGrunz (and Adriana Simasook).  Agree with management.  Please see my co sign of the H&PE for my note of today.

## 2013-01-03 NOTE — Progress Notes (Signed)
Family Medicine Teaching Service Daily Progress Note Intern Pager: [phone]  Patient name: [name] [name] Medical record number: [id number] Date of birth: Mar 23, 1985 Age: 28 y.o. Gender: female  Primary Care Provider: Carney LivingHAMBLISS,MARSHALL L, MD Consultants: None Code Status: Full  Pt Overview and Major Events to Date:  1/1: Admitted for asthma exacerbation  Assessment and Plan: [name] [name] is a 28 y.o. female G2P0101 at 328w2d presenting with acute asthma exacerbation for past 3 days in the setting of recent URI. PMH is significant for asthma with 2 recent hospitalizations for exacerbation (September and October), eczema, depression.   # Asthma exacerbation: current episode lasting 3 days despite home nebs/inhaler and 3 treatments duonebs in ED. Recent viral URI likely causing exacerbation, in addition to not taking any controller meds. CXR suggestive of bronchitis/reactive airway disease, no evidence for pneumonia. WBC at 18.1 but CBC drawn after prednisone  - scheduled duonebs q4hrs, albuterol q2hrs PRN  - prednisone 40mg  daily (day 2/5) - supplemental O2 by [place] to keep sat >92%  - continue home singulair, claritin, flonase  - dulera 2 puffs BID, will D/C with this - mucinex DM for cough  # Eczema: very mild along forearms  - prednisone per above  - eucerin cream as needed  - hydrocortisone 0.5% cream BID   # Current pregnancy  - No complications - FHR - 150's by hand-held doppler  - +GFM, denies loss of fluid, vaginal bleeding, contractions or vaginal discharge.   FEN/GI: reg diet, saline lock  Prophylaxis: SCDs  Disposition: Continued management of asthma exacerbation and URI.   Subjective: Still coughing, interfering with sleep, reports feeling fever without chills. Only small improvement with albuterol  Objective: Temp:  [98 F (36.7 C)-98.5 F (36.9 C)] 98.4 F (36.9 C) (01/02 0518) Pulse Rate:  [100-127] 100 (01/02 0518) Resp:  [20-22] 20 (01/02 0518) BP:  (92-118)/(39-66) 104/66 mmHg (01/02 0518) SpO2:  [95 %-99 %] 99 % (01/02 0518) Weight:  [167 lb 12.3 oz (76.1 kg)-168 lb 2 oz (76.261 kg)] 167 lb 12.3 oz (76.1 kg) (01/01 1701) Physical Exam: General: Ill-appearing 27yo female sleeping on L side in NAD Cardiovascular: RRR, no M/R/G Respiratory: Rhonchi throughout with end-expiratory wheezing. Non-labored on room air Abdomen: Gravid to just below umbilicus, NT, ND Extremities: No edema, WWP, full AROM  Laboratory:  Recent Labs Lab 01/02/13 1508  WBC 18.1*  HGB 12.3  HCT 35.2*  PLT 370    Recent Labs Lab 01/02/13 1508  NA 138  K 3.2*  CL 104  CO2 19  BUN 6  CREATININE 0.47*  CALCIUM 8.3*  GLUCOSE 156*    Imaging/Diagnostic Tests: FINDINGS:  The cardiac silhouette, mediastinal and hilar contours are within  normal limits and stable. There is mild peribronchial thickening and  increased interstitial markings suggesting reactive airways disease  or bronchitis. No focal infiltrates or effusions. The bony thorax is  intact.  IMPRESSION:  Findings suggest bronchitis or reactive airways disease.  Hazeline Junkeryan Zaedyn Covin, MD 01/03/2013, 9:27 AM PGY-1, [facility] FPTS Intern pager: [phone], text pages welcome

## 2013-01-04 DIAGNOSIS — D72829 Elevated white blood cell count, unspecified: Secondary | ICD-10-CM

## 2013-01-04 MED ORDER — PREDNISONE 20 MG PO TABS
40.0000 mg | ORAL_TABLET | Freq: Every day | ORAL | Status: DC
  Administered 2013-01-04: 13:00:00 40 mg via ORAL
  Filled 2013-01-04 (×2): qty 2

## 2013-01-04 MED ORDER — MOMETASONE FURO-FORMOTEROL FUM 200-5 MCG/ACT IN AERO: 2.0000 | INHALATION_SPRAY | Freq: Two times a day (BID) | RESPIRATORY_TRACT | Status: DC

## 2013-01-04 MED ORDER — ALBUTEROL SULFATE (5 MG/ML) 0.5% IN NEBU
5.0000 mg | INHALATION_SOLUTION | RESPIRATORY_TRACT | Status: DC | PRN
Start: 2013-01-04 — End: 2013-05-09

## 2013-01-04 MED ORDER — ALBUTEROL SULFATE HFA 108 (90 BASE) MCG/ACT IN AERS: 2.0000 | INHALATION_SPRAY | RESPIRATORY_TRACT | Status: DC

## 2013-01-04 MED ORDER — PREDNISONE 20 MG PO TABS: 40.0000 mg | ORAL_TABLET | Freq: Every day | ORAL | Status: DC

## 2013-01-04 NOTE — Discharge Summary (Signed)
FMTS Attending Daily Note:  Renold DonJeff Caylor Cerino MD  [phone] pager  Family Practice pager:  [phone] I have discussed this patient with the resident Dr. Birdie SonsSonnenberg.  I agree with their findings, assessment, and care plan.  Plan for DC home today

## 2013-01-04 NOTE — Progress Notes (Signed)
   CARE MANAGEMENT NOTE 01/04/2013  Patient:  [name] [name],[name] [name]   Account Number:  [id number]  Date Initiated:  01/04/2013  Documentation initiated by:  [facility] LLC[facility]FFRIES,Tadao Emig  Subjective/Objective Assessment:   adm:  acute asthma exacerbation for past 3 days     Action/Plan:   discahrge plan   Anticipated DC Date:  01/04/2013   Anticipated DC Plan:  HOME/SELF CARE      DC Planning Services  CM consult      Choice offered to / List presented to:     DME arranged  NEBULIZER MACHINE      DME agency  [facility]        Status of service:  Completed, signed off Medicare Important Message given?   (If response is "NO", the following Medicare IM given date fields will be blank) Date Medicare IM given:   Date Additional Medicare IM given:    Discharge Disposition:  HOME/SELF CARE  Per UR Regulation:    If discussed at Long Length of Stay Meetings, dates discussed:    Comments:  01/04/13 13:00 MD notified for order clarification of DME home neb machine.  Order placed per MD request.  No other CM needs communicated.  Neb machine will be delivered to room prior to discharge.  Freddy JakschSarah Korin Setzler, BSN, CM [phone].

## 2013-01-04 NOTE — ED Provider Notes (Signed)
Medical screening examination/treatment/procedure(s) were performed by non-physician practitioner and as supervising physician I was immediately available for consultation/collaboration.  EKG Interpretation    Date/Time:    Ventricular Rate:    PR Interval:    QRS Duration:   QT Interval:    QTC Calculation:   R Axis:     Text Interpretation:               Ethelda ChickMartha K Linker, MD 01/04/13 [phone]

## 2013-01-04 NOTE — Discharge Instructions (Signed)
You were admitted for an asthma exacerbation. It is very important that you use your controller medications (dulera and spiriva) every day as these will help prevent exacerbations in the future. You should complete your course of prednisone with your last dose being on 12/5.  Please use your albuterol inhaler 2 puffs every 4 hours for the next 2 days, after that you can use it as needed every 4 hours.   Asthma, Adult Asthma is a recurring condition in which the airways tighten and narrow. Asthma can make it difficult to breathe. It can cause coughing, wheezing, and shortness of breath. Asthma episodes (also called asthma attacks) range from minor to life-threatening. Asthma cannot be cured, but medicines and lifestyle changes can help control it. CAUSES Asthma is believed to be caused by inherited (genetic) and environmental factors, but its exact cause is unknown. Asthma may be triggered by allergens, lung infections, or irritants in the air. Asthma triggers are different for each person. Common triggers include:   Animal dander.  Dust mites.  Cockroaches.  Pollen from trees or grass.  Mold.  Smoke.  Air pollutants such as dust, household cleaners, hair sprays, aerosol sprays, paint fumes, strong chemicals, or strong odors.  Cold air, weather changes, and winds (which increase molds and pollens in the air).  Strong emotional expressions such as crying or laughing hard.  Stress.  Certain medicines (such as aspirin) or types of drugs (such as beta-blockers).  Sulfites in foods and drinks. Foods and drinks that may contain sulfites include dried fruit, potato chips, and sparkling grape juice.  Infections or inflammatory conditions such as the flu, a cold, or an inflammation of the nasal membranes (rhinitis).  Gastroesophageal reflux disease (GERD).  Exercise or strenuous activity. SYMPTOMS Symptoms may occur immediately after asthma is triggered or many hours later. Symptoms  include:  Wheezing.  Excessive nighttime or early morning coughing.  Frequent or severe coughing with a common cold.  Chest tightness.  Shortness of breath. DIAGNOSIS  The diagnosis of asthma is made by a review of your medical history and a physical exam. Tests may also be performed. These may include:  Lung function studies. These tests show how much air you breath in and out.  Allergy tests.  Imaging tests such as X-rays. TREATMENT  Asthma cannot be cured, but it can usually be controlled. Treatment involves identifying and avoiding your asthma triggers. It also involves medicines. There are 2 classes of medicine used for asthma treatment:   Controller medicines. These prevent asthma symptoms from occurring. They are usually taken every day.  Reliever or rescue medicines. These quickly relieve asthma symptoms. They are used as needed and provide short-term relief. Your health care provider will help you create an asthma action plan. An asthma action plan is a written plan for managing and treating your asthma attacks. It includes a list of your asthma triggers and how they may be avoided. It also includes information on when medicines should be taken and when their dosage should be changed. An action plan may also involve the use of a device called a peak flow meter. A peak flow meter measures how well the lungs are working. It helps you monitor your condition. HOME CARE INSTRUCTIONS   Take medicine as directed by your health care provider. Speak with your health care provider if you have questions about how or when to take the medicines.  Use a peak flow meter as directed by your health care provider. Record and keep track  of readings.  Understand and use the action plan to help minimize or stop an asthma attack without needing to seek medical care.  Control your home environment in the following ways to help prevent asthma attacks:  Do not smoke. Avoid being exposed to  secondhand smoke.  Change your heating and air conditioning filter regularly.  Limit your use of fireplaces and wood stoves.  Get rid of pests (such as roaches and mice) and their droppings.  Throw away plants if you see mold on them.  Clean your floors and dust regularly. Use unscented cleaning products.  Try to have someone else vacuum for you regularly. Stay out of rooms while they are being vacuumed and for a short while afterward. If you vacuum, use a dust mask from a hardware store, a double-layered or microfilter vacuum cleaner bag, or a vacuum cleaner with a HEPA filter.  Replace carpet with wood, tile, or vinyl flooring. Carpet can trap dander and dust.  Use allergy-proof pillows, mattress covers, and box spring covers.  Wash bed sheets and blankets every week in hot water and dry them in a dryer.  Use blankets that are made of polyester or cotton.  Clean bathrooms and kitchens with bleach. If possible, have someone repaint the walls in these rooms with mold-resistant paint. Keep out of the rooms that are being cleaned and painted.  Wash hands frequently. SEEK MEDICAL CARE IF:   You have wheezing, shortness of breath, or a cough even if taking medicine to prevent attacks.  The colored mucus you cough up (sputum) is thicker than usual.  Your sputum changes from clear or white to yellow, green, gray, or bloody.  You have any problems that may be related to the medicines you are taking (such as a rash, itching, swelling, or trouble breathing).  You are using a reliever medicine more than 2 3 times per week.  Your peak flow is still at 50 79% of you personal best after following your action plan for 1 hour. SEEK IMMEDIATE MEDICAL CARE IF:   You seem to be getting worse and are unresponsive to treatment during an asthma attack.  You are short of breath even at rest.  You get short of breath when doing very little physical activity.  You have difficulty eating,  drinking, or talking due to asthma symptoms.  You develop chest pain.  You develop a fast heartbeat.  You have a bluish color to your lips or fingernails.  You are lightheaded, dizzy, or faint.  Your peak flow is less than 50% of your personal best.  You have a fever or persistent symptoms for more than 2 3 days.  You have a fever and symptoms suddenly get worse. MAKE SURE YOU:   Understand these instructions.  Will watch your condition.  Will get help right away if you are not doing well or get worse. Document Released: 12/19/2004 Document Revised: 08/21/2012 Document Reviewed: 07/18/2012 [facility] Patient Information 2014 [place], [place].

## 2013-01-04 NOTE — Discharge Summary (Signed)
Family Medicine Teaching [facility] Discharge Summary  Patient name: [name] [name] Medical record number: [id number] Date of birth: Jan 31, 1985 Age: 27 y.o. Gender: female Date of Admission: 01/02/2013  Date of Discharge: 01/04/13 Admitting Physician: Sanjuana Letters, MD  Primary Care Provider: Carney Living, MD Consultants: none  Indication for Hospitalization: asthma exacerbation  Discharge Diagnoses/Problem List:  Asthma exacerbation Eczema Pregnancy  Disposition: discharge home  Discharge Condition: improved  Discharge Exam:  General: NAD, sitting up in bed comfortably  Cardiovascular: RRR, no M/R/G  Respiratory: scattered expiratory wheezes. No increased work of breathing, breathing comfortably on room air. Extremities: No edema, WWP, full AROM   Brief Hospital Course: [name] [name] is a 28 y.o. female G2P0101 at [redacted]w[redacted]d presenting with acute asthma exacerbation for past 3 days in the setting of recent URI. PMH is significant for asthma with 2 recent hospitalizations for exacerbation (September and October), eczema, depression.   # Asthma exacerbation: current episode lasting 3 days despite home nebs/inhaler and 3 treatments duonebs in ED. Recent viral URI likely causing exacerbation, in addition to not taking any controller meds. CXR suggestive of bronchitis/reactive airway disease, no evidence for pneumonia. WBC at 18.1 but CBC drawn after prednisone. Patient improved with duonebs and albuterol prn. She was started on prednisone 40 mg daily as well. She was additionally started on dulera 2 puffs BID and continued on home singulair, claratin, and flonase. She was improved with her respiratory status at time of discharge.  # Eczema: very mild along forearms  - prednisone per above  - eucerin cream as needed  - hydrocortisone 0.5% cream BID   # Current pregnancy  - No complications  - FHR - 150's by hand-held doppler  - +GFM, denies loss of fluid, vaginal  bleeding, contractions or vaginal discharge.    Issues for Follow Up:  1. Compliance with asthma controller medications and completion of prednisone course 2. Continued pregnancy care  Significant Procedures: none  Significant Labs and Imaging:   Recent Labs Lab 01/02/13 1508  WBC 18.1*  HGB 12.3  HCT 35.2*  PLT 370    Recent Labs Lab 01/02/13 1508  NA 138  K 3.2*  CL 104  CO2 19  GLUCOSE 156*  BUN 6  CREATININE 0.47*  CALCIUM 8.3*    Dg Chest 2 View  01/02/2013   CLINICAL DATA:  Cough and shortness of Breath.  EXAM: CHEST  2 VIEW  COMPARISON:  11/20/2012.  FINDINGS: The cardiac silhouette, mediastinal and hilar contours are within normal limits and stable. There is mild peribronchial thickening and increased interstitial markings suggesting reactive airways disease or bronchitis. No focal infiltrates or effusions. The bony thorax is intact.  IMPRESSION: Findings suggest bronchitis or reactive airways disease.   Electronically Signed   By: Loralie Champagne [name]   On: 01/02/2013 16:28     Results/Tests Pending at Time of Discharge: none  Discharge Medications:    Medication List         acetaminophen-codeine 300-30 MG per tablet  Commonly known as:  TYLENOL #3  Take 1-2 tablets by mouth every 6 (six) hours as needed for moderate pain.     albuterol 108 (90 BASE) MCG/ACT inhaler  Commonly known as:  PROVENTIL HFA;VENTOLIN HFA  Inhale 2 puffs into the lungs every 4 (four) hours. For the next 2 days. After that use as needed every 4 hours.     albuterol (5 MG/ML) 0.5% nebulizer solution  Commonly known as:  PROVENTIL  Take 1 mL (5  mg total) by nebulization every 4 (four) hours as needed for wheezing or shortness of breath.     fluticasone 50 MCG/ACT nasal spray  Commonly known as:  FLONASE  2 sprays each nostril daily     loratadine 10 MG tablet  Commonly known as:  CLARITIN  Take 1 tablet (10 mg total) by mouth daily.     mometasone-formoterol 200-5 MCG/ACT  Aero  Commonly known as:  DULERA  Inhale 2 puffs into the lungs 2 (two) times daily.     montelukast 10 MG tablet  Commonly known as:  SINGULAIR  Take 1 tablet (10 mg total) by mouth at bedtime.     predniSONE 20 MG tablet  Commonly known as:  DELTASONE  Take 2 tablets (40 mg total) by mouth daily with breakfast.     prenatal multivitamin Tabs tablet  Take 1 tablet by mouth daily at 12 noon.        Discharge Instructions: Please refer to Patient Instructions section of EMR for full details.  Patient was counseled important signs and symptoms that should prompt return to medical care, changes in medications, dietary instructions, activity restrictions, and follow up appointments.   Follow-Up Appointments: Follow-up Information   Follow up with Carney LivingHAMBLISS,MARSHALL L, MD. (please call on monday to schedule a follow-up appointment as soon as possible)    Specialty:  Family Medicine   Contact information:   [address] [place] [address] [phone]       Glori LuisEric G Tanja Gift, MD 01/04/2013, 1:40 PM PGY-2, [facility]

## 2013-01-04 NOTE — Progress Notes (Signed)
Utilization Review completed.  

## 2013-01-04 NOTE — Progress Notes (Signed)
01/04/13 Patient going home today. Discharge instructions reviewed with patient. Waiting on nebulizer to be dropped off to room before discharge.

## 2013-01-04 NOTE — Progress Notes (Signed)
MD was notified that patient had concerns that mucinex DM was not safe for her baby.  MD on call for FMTS stated that there was only a small amount of research done with this medication, however results showed, no mishaps or harm to the fetus when taken during the first trimester.  The patient is now in her second trimester.  The MD stated it was up to her to decide whether the benefits would outweigh the risks. This information was all shared with the patient, while this nurse was still on the phone with the doctor. Patient requested to still be given this medication.  Will continue to monitor patient.

## 2013-01-06 ENCOUNTER — Encounter: Payer: Self-pay | Admitting: Family Medicine

## 2013-01-06 NOTE — Telephone Encounter (Signed)
Patient admitted to hospital. Treated with systemic corticosteroids.  Pt discharge on 01/05/12.  [facility]

## 2013-01-06 NOTE — Progress Notes (Signed)
Fax'd note from [facility] on [address] dated 12/30/12 reporting patient using at least 20 puffs per day of Proventil.  Pharmacist did not feel comfortable refilling this prescription early. Refill declined.

## 2013-02-19 IMAGING — CR DG CHEST 2V
2 series · 2 of 2 positions shown · non-contrast
Comparison: 06/14/2011

CLINICAL DATA: Asthma.

CHEST - 2 VIEW

[w chest pa]
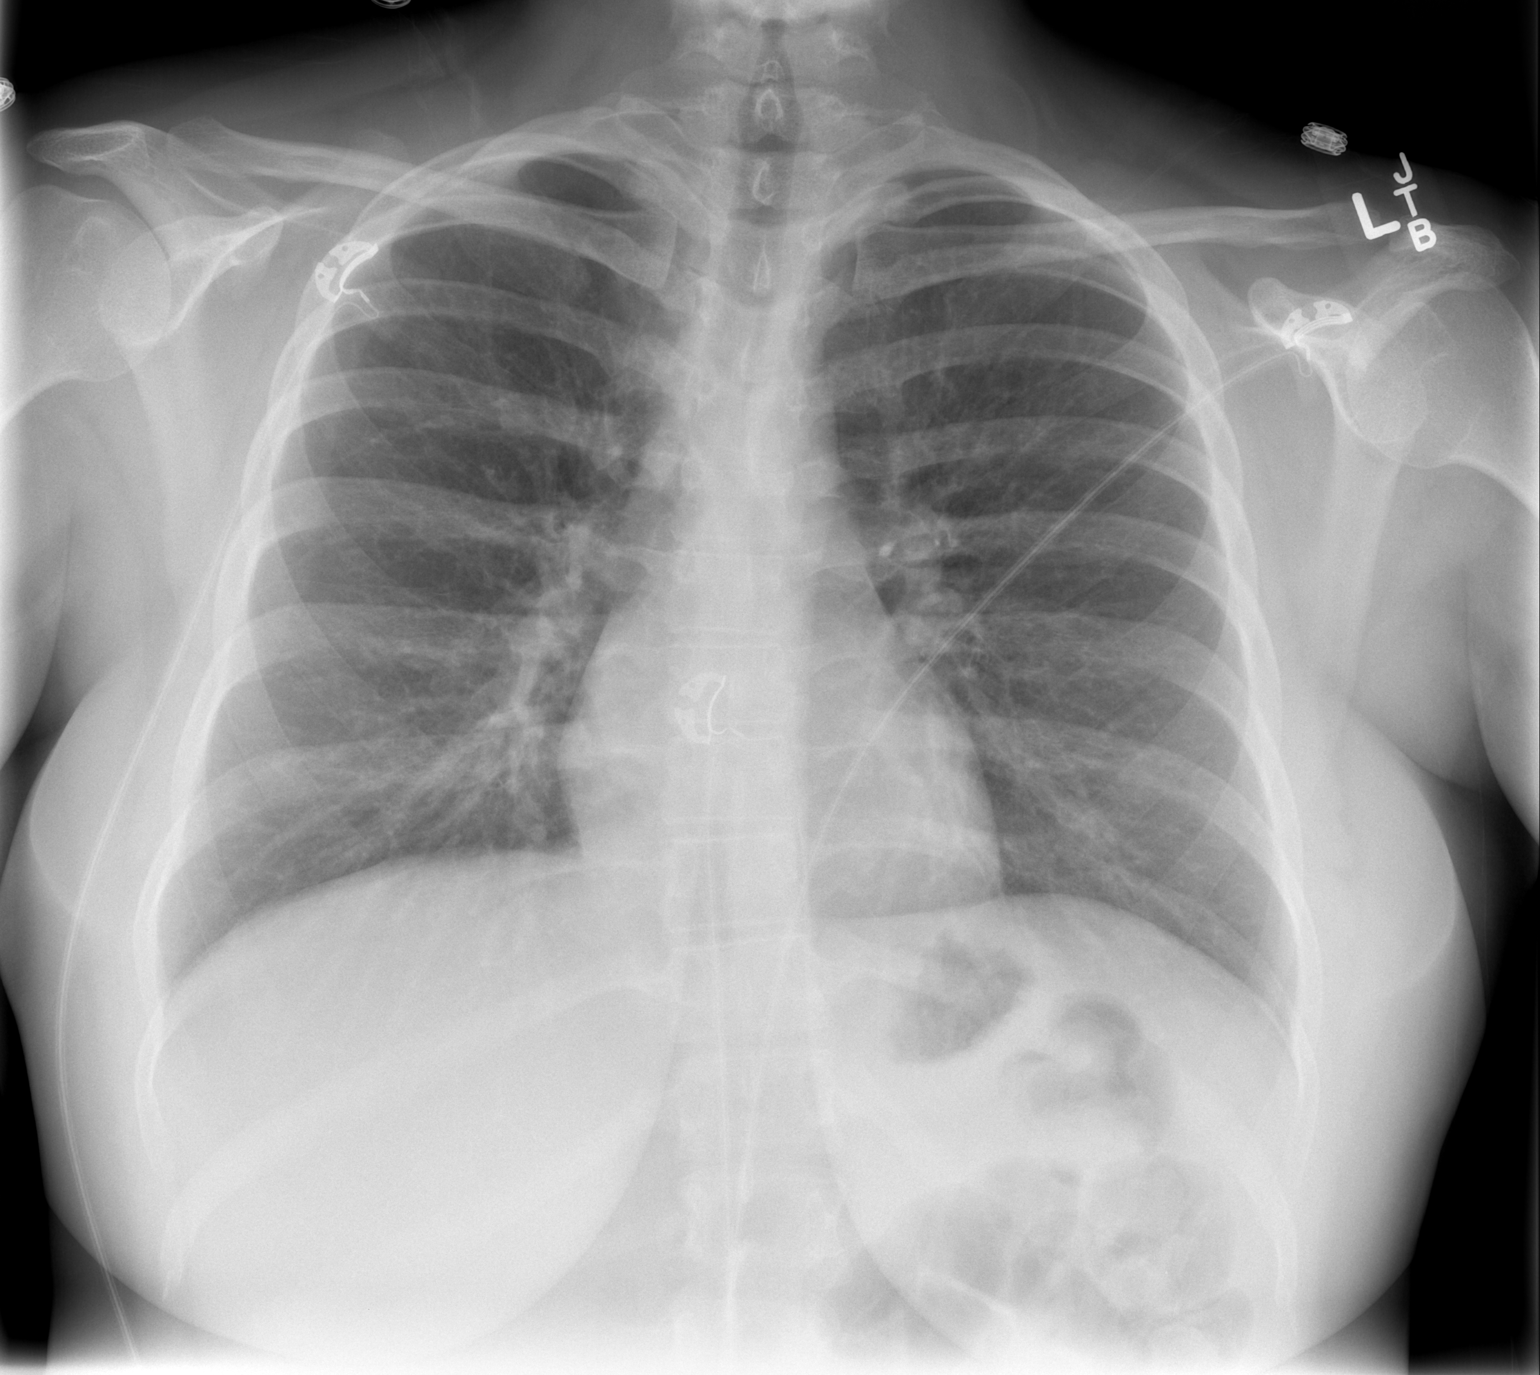

[w chest lat]
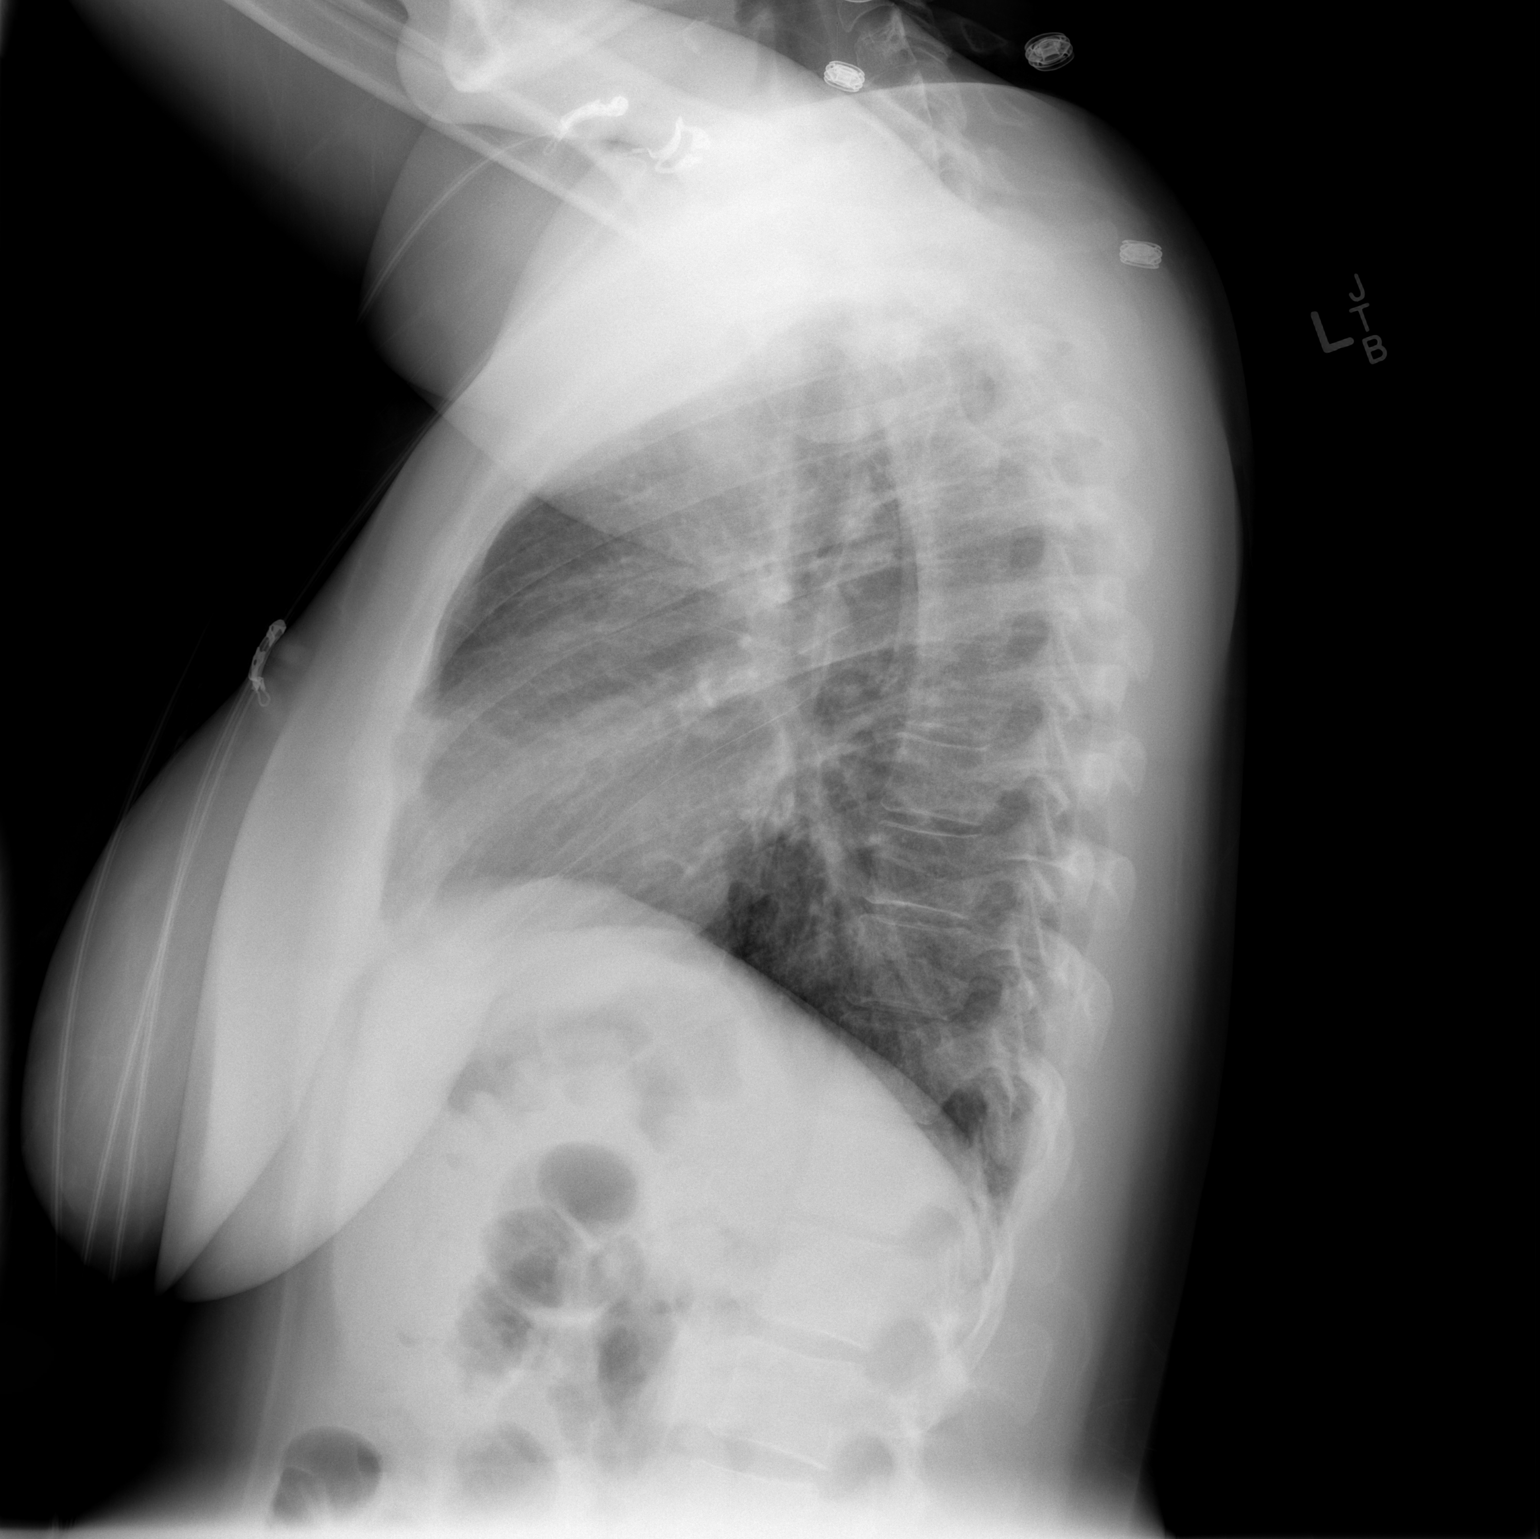

[2 of 2 positions shown; findings below may reference images not displayed]

FINDINGS: Heart and mediastinal contours are within normal limits.
No focal opacities or effusions.  No acute bony abnormality.
IMPRESSION: No active cardiopulmonary disease.

## 2013-02-26 ENCOUNTER — Ambulatory Visit (INDEPENDENT_AMBULATORY_CARE_PROVIDER_SITE_OTHER): Payer: Medicaid Other | Admitting: Family Medicine

## 2013-02-26 VITALS — BP 133/81 | HR 116 | Temp 97.7°F | Wt 178.0 lb

## 2013-02-26 DIAGNOSIS — M542 Cervicalgia: Secondary | ICD-10-CM | POA: Insufficient documentation

## 2013-02-26 DIAGNOSIS — L2089 Other atopic dermatitis: Secondary | ICD-10-CM

## 2013-02-26 DIAGNOSIS — F32A Depression, unspecified: Secondary | ICD-10-CM

## 2013-02-26 DIAGNOSIS — F3289 Other specified depressive episodes: Secondary | ICD-10-CM

## 2013-02-26 DIAGNOSIS — J45909 Unspecified asthma, uncomplicated: Secondary | ICD-10-CM

## 2013-02-26 DIAGNOSIS — R000 Tachycardia, unspecified: Secondary | ICD-10-CM

## 2013-02-26 DIAGNOSIS — F329 Major depressive disorder, single episode, unspecified: Secondary | ICD-10-CM

## 2013-02-26 MED ORDER — TRIAMCINOLONE ACETONIDE 0.025 % EX OINT: 1.0000 "application " | TOPICAL_OINTMENT | Freq: Two times a day (BID) | CUTANEOUS | Status: DC

## 2013-02-26 NOTE — Patient Instructions (Signed)
Good to see you today!  Thanks for coming in.  Use the triamcinolone ointment twice daily in small amounts just to cover the red areas  Then use vaseline heavily especially at night  (wear a shirt)  Use heat and range of motion exercises for the neck  Call if the asthma seems to be getting worse

## 2013-02-26 NOTE — Assessment & Plan Note (Signed)
May be due to pregnancy and neck pain.  Will monitor No signs of heart disease

## 2013-02-26 NOTE — Assessment & Plan Note (Signed)
No signs of cord or nerve compression or focal bony lesion.  Likely overuse with ? Laxity of pregnancy.  Symptomatic treatment.  If worsens or persists after pregnancy consider an xray

## 2013-02-26 NOTE — Assessment & Plan Note (Signed)
Good spirits today.

## 2013-02-26 NOTE — Assessment & Plan Note (Signed)
Stable continue current treatment

## 2013-02-26 NOTE — Assessment & Plan Note (Signed)
Worsened.  Will use only mild-moderate steroid and moisturizers given her pregnant state

## 2013-02-26 NOTE — Progress Notes (Signed)
   Subjective:    Patient ID: [name] [name], female    DOB: 10-10-1985, 28 y.o.   MRN: [id number]  HPI  Asthma Using dulera twice daily and albuterol once a day or so.  Feels she is under good control.  No shortness of breath with usual activities or night cough  Exzema Having a flare on her arms and abdomen (baby belly)  Using otc steroid and vaseline without much help.  No bleeding or pus or fever  Neck Pain Popping and mild pain with occasional nausea feeling for last few weeks.  No injury or weakness or change in senation or incontinence.  Not taking any medication.  Feels worse at end of day  Review of Symptoms - see HPI  PMH - Smoking status noted.      Review of Systems     Objective:   Physical Exam  Alert no acute distress Lungs:  Normal respiratory effort, chest expands symmetrically. Lungs are clear to auscultation, no crackles or wheezes. Heart - Regular rate and rhythm.  No murmurs, gallops or rubs.    Skin - dry with thickened red rough patches on neck and arms and abdomen.   Neck - FROM she feels popping I dont hear any.  No focal tender areas anter or posterior.  Able to walk on heels and toes and normal grip strength bilateral       Assessment & Plan:

## 2013-04-03 ENCOUNTER — Ambulatory Visit (INDEPENDENT_AMBULATORY_CARE_PROVIDER_SITE_OTHER): Payer: Medicaid Other | Admitting: Family Medicine

## 2013-04-03 ENCOUNTER — Encounter: Payer: Self-pay | Admitting: Family Medicine

## 2013-04-03 VITALS — BP 131/70 | HR 108 | Temp 99.5°F | Ht 60.0 in | Wt 182.0 lb

## 2013-04-03 DIAGNOSIS — L42 Pityriasis rosea: Secondary | ICD-10-CM

## 2013-04-03 MED ORDER — HYDROXYZINE HCL 10 MG PO TABS: 10.0000 mg | ORAL_TABLET | Freq: Three times a day (TID) | ORAL | Status: DC | PRN

## 2013-04-03 NOTE — Assessment & Plan Note (Addendum)
Pt with atypical lesions but typical course and distribution.  No systemic symptoms and will call in antihistamine for pruritis.  There are small case reports suggesting PR increases spontaneous abortion in pregnancy but most of these were prior to [redacted] weeks gestation.  Observe closely, and explained low likelihood of affecting fetus, but is small risk.  Explained course is usually 2-3 months as well, but if she develops fever, chills, or open lesions, she needs to be seen again.  Precepted with Dr. Jennette KettleNeal

## 2013-04-03 NOTE — Patient Instructions (Signed)
Pityriasis Rosea  Pityriasis rosea is a rash which is probably caused by a virus. It generally starts as a scaly, red patch on the trunk (the area of the body that a t-shirt would cover) but does not appear on sun exposed areas. The rash is usually preceded by an initial larger spot called the "herald patch" a week or more before the rest of the rash appears. Generally within one to two days the rash appears rapidly on the trunk, upper arms, and sometimes the upper legs. The rash usually appears as flat, oval patches of scaly pink color. The rash can also be raised and one is able to feel it with a finger. The rash can also be finely crinkled and may slough off leaving a ring of scale around the spot. Sometimes a mild sore throat is present with the rash. It usually affects children and young adults in the spring and autumn. Women are more frequently affected than men.  TREATMENT   Pityriasis rosea is a self-limited condition. This means it goes away within 4 to 8 weeks without treatment. The spots may persist for several months, especially in darker-colored skin after the rash has resolved and healed. Benadryl and steroid creams may be used if itching is a problem.  SEEK MEDICAL CARE IF:   · Your rash does not go away or persists longer than three months.  · You develop fever and joint pain.  · You develop severe headache and confusion.  · You develop breathing difficulty, vomiting and/or extreme weakness.  Document Released: 01/25/2001 Document Revised: 03/13/2011 Document Reviewed: 02/14/2008  [employer]® Patient Information ©2014 [employer].

## 2013-04-03 NOTE — Progress Notes (Signed)
[name] [name] is a 28 y.o. female who presents today for generalized rash.    Pt has had the rash for about one month now, started on her back and has now spread to the anterior trunk and abdomen.  Extremely pruritic and has grown in distribution since being seen two weeks ago.  At that time, thought to be eczema and started on Kenalog cream BID, which has helped with itching but not the distribution.  She denies any fever, chills, sweats, open lesions, purulent drainage, bullae, or vesicular lesions.  No hx of Psoriasis, immunodeficiency.  Pt is [redacted] weeks pregnant.   Past Medical History  Diagnosis Date  . Asthma   . Pregnant state, incidental   . Breast abscess   . Family history of anesthesia complication     Grandmother has difficulty waskin up.  . Depression     HX of - not on med n, doing well.  . Pneumonia     years ago has had a few times.  . Eczema     History  Smoking status  . Former Smoker  . Quit date: 06/22/2010  Smokeless tobacco  . Never Used    Family History  Problem Relation Age of Onset  . Healthy Mother   . Healthy Father   . Stroke Maternal Grandmother   . Stroke Paternal Grandfather     Current Outpatient Prescriptions on File Prior to Visit  Medication Sig Dispense Refill  . acetaminophen-codeine (TYLENOL #3) 300-30 MG per tablet Take 1-2 tablets by mouth every 6 (six) hours as needed for moderate pain.  15 tablet  0  . albuterol (PROVENTIL HFA;VENTOLIN HFA) 108 (90 BASE) MCG/ACT inhaler Inhale 2 puffs into the lungs every 4 (four) hours. For the next 2 days. After that use as needed every 4 hours.  1 Inhaler  0  . albuterol (PROVENTIL) (5 MG/ML) 0.5% nebulizer solution Take 1 mL (5 mg total) by nebulization every 4 (four) hours as needed for wheezing or shortness of breath.  20 mL  12  . fluticasone (FLONASE) 50 MCG/ACT nasal spray 2 sprays each nostril daily  16 g  2  . loratadine (CLARITIN) 10 MG tablet Take 1 tablet (10 mg total) by mouth daily.  90  tablet  1  . mometasone-formoterol (DULERA) 200-5 MCG/ACT AERO Inhale 2 puffs into the lungs 2 (two) times daily.  1 Inhaler  2  . montelukast (SINGULAIR) 10 MG tablet Take 1 tablet (10 mg total) by mouth at bedtime.  30 tablet  3  . Prenatal Vit-Fe Fumarate-FA (PRENATAL MULTIVITAMIN) TABS tablet Take 1 tablet by mouth daily at 12 noon.  30 tablet  11  . triamcinolone (KENALOG) 0.025 % ointment Apply 1 application topically 2 (two) times daily.  80 g  2   No current facility-administered medications on file prior to visit.    ROS: Per HPI.  All other systems reviewed and are negative.   Physical Exam Filed Vitals:   04/03/13 1059  BP: 131/70  Pulse: 108  Temp: 99.5 F (37.5 C)    Physical Examination: General appearance - alert, well appearing, and in no distress Heart - normal rate and regular rhythm, no murmurs noted Skin - Widespread plaques and patches with some scaling located over the entire posterior thorax and areas of the anterior thorax.  No open vesicles, pustules, bullae.

## 2013-04-11 ENCOUNTER — Other Ambulatory Visit (HOSPITAL_COMMUNITY): Payer: Self-pay | Admitting: Obstetrics and Gynecology

## 2013-04-11 DIAGNOSIS — O283 Abnormal ultrasonic finding on antenatal screening of mother: Secondary | ICD-10-CM

## 2013-04-11 DIAGNOSIS — Z3689 Encounter for other specified antenatal screening: Secondary | ICD-10-CM

## 2013-04-14 ENCOUNTER — Encounter (HOSPITAL_COMMUNITY): Payer: Self-pay

## 2013-04-14 ENCOUNTER — Ambulatory Visit (HOSPITAL_COMMUNITY)
Admission: RE | Admit: 2013-04-14 | Discharge: 2013-04-14 | Disposition: A | Discharge status: Home / Self Care | Payer: Medicaid Other | Source: Ambulatory Visit | Attending: Obstetrics and Gynecology | Admitting: Obstetrics and Gynecology

## 2013-04-14 DIAGNOSIS — Z363 Encounter for antenatal screening for malformations: Secondary | ICD-10-CM | POA: Insufficient documentation

## 2013-04-14 DIAGNOSIS — IMO0002 Reserved for concepts with insufficient information to code with codable children: Secondary | ICD-10-CM | POA: Insufficient documentation

## 2013-04-14 DIAGNOSIS — O09299 Supervision of pregnancy with other poor reproductive or obstetric history, unspecified trimester: Secondary | ICD-10-CM | POA: Insufficient documentation

## 2013-04-14 DIAGNOSIS — Z1389 Encounter for screening for other disorder: Secondary | ICD-10-CM | POA: Insufficient documentation

## 2013-04-14 DIAGNOSIS — Z8751 Personal history of pre-term labor: Secondary | ICD-10-CM | POA: Insufficient documentation

## 2013-04-14 DIAGNOSIS — Z3689 Encounter for other specified antenatal screening: Secondary | ICD-10-CM

## 2013-04-14 DIAGNOSIS — O283 Abnormal ultrasonic finding on antenatal screening of mother: Secondary | ICD-10-CM

## 2013-04-14 DIAGNOSIS — O358XX0 Maternal care for other (suspected) fetal abnormality and damage, not applicable or unspecified: Secondary | ICD-10-CM | POA: Insufficient documentation

## 2013-04-14 DIAGNOSIS — Q270 Congenital absence and hypoplasia of umbilical artery: Secondary | ICD-10-CM | POA: Insufficient documentation

## 2013-04-14 NOTE — Progress Notes (Signed)
[facility]  Indication: 28 yr old G10P0101 at 51w6dfor fetal ultrasound. Finding of single umbilical artery and shortened long bones on outside ultrasound. Low PAPP-A of 0.34MoM.  Findings: 1. Single intrauterine pregnancy. 2. Estimated fetal weight is in the 24th%. The long bones measure 4-5 weeks behind dating. 3. Anterior placenta without evidence of previa. 4. Normal amniotic fluid index. 5. There is a single umbilical artery. 6. The views of the right outflow, ductal arch, abdominal cord insertion, spine, hands, and ankles are limited. 7. The remainder of the limited anatomy survey is normal. 8. The bones have a normal shape and echogenicity. No fractures or bowing is seen.  Recommendations: 1. Overall appropriate fetal growth. 2. Limited anatomy survey: - discussed with patient the limitations of ultrasound in detecting fetal anomalies especially given advanced gestational age 531 Single umbilical artery: - I discussed that this finding has a slight association with fetal aneuploidy- patient met with the genetic counselor; see separate report; had normal aneuploidy risk on first trimester screen I also discussed the association with fetal anomalies including genitourinary and cardiac. No evidence of anomalies was seen on today's exam but anatomy survey is limited. There are mixed findings in the literature regarding association with single umbilical artery and fetal growth restriction. Discussed overall growth is normal on today's exam. Recommend repeat growth in 4 weeks. 4. Low PAPP-A: - I discussed the association with fetal aneuploidy- although had normal aneuploidy risk on first trimester screen. I also discussed the association with increased risk of adverse pregnancy outcomes including increased risk of fetal growth restriction, preeclampsia, stillbirth, preterm birth, and abruption. I discussed these risks are only slightly increased over baseline but  heightened surveillance is warranted. I recommend serial fetal growth ultrasounds.  I recommend close surveillance for the development of signs/symptoms of preterm labor, PPROM, and preeclampsia 5. Shortened long bones: - patient met with genetic counselor; see separate report - discussed that bones measure 4-5 weeks behind and this may be constitutional or may be indicative of skeletal dysplasia - if this is a skeletal dysplasia unlikely to be lethal as long bones appear normal, chest appears normal, and long bones are only lagging 4-5 weeks - discussed will likely not know until delivery and full examination of the newborn; recommend inform Pediatrics at delivery - recommend fetal growth in 3-4 weeks   [place] MD

## 2013-04-14 NOTE — Progress Notes (Signed)
Genetic Counseling  High-Risk Gestation Note  Appointment Date:  04/14/2013 Referred By: Sherian ReinBovard-Stuckert, Jody, * Date of Birth:  Sep 26, 1985 Partner:  Wilmon ArmsAllen Griffiths    Pregnancy History: [id number] Estimated Date of Delivery: 05/27/13 Estimated Gestational Age: 81105w6d Attending: Eulis FosterKristen Quinn, MD      Ms. Benson Norwayourtney Kilby and her partner were seen for genetic counseling because of abnormal ultrasound findings.  As you know, Mrs. [name] [name] was sent for ultrasound and consultation regarding abnormal ultrasound findings visualized by ultrasound through your office.  Ultrasound today in our office noted shortened long bones with both the femurs and humeri measuring less than the 5th percentile for gestational age. The bones have a normal shape and echogenicity. No fractures or bowing is seen.  Single umbilical artery is also present, which was previously visualized in OB office. No other differences were seen; however, much of the fetal anatomy was not well visualized, given the advanced gestational age.  The long bones were within normal limits at the time of the fetal anatomy ultrasound, but are now ~4 weeks behind (femurs) and approximately 2 weeks behind (humeri).    We discussed these findings in detail.  Specifically, we discussed that ultrasound differences can occur as isolated, nonsyndromic birth defects/variants, or as features of an underlying genetic syndrome.  The risk for a genetic etiology increases with the presence of multiple fetal anatomic differences; however, the specific risk depends on the constellation of findings.  Based on the combination of ultrasound findings, and considering Ms. Janell QuietKilby's normal first trimester screening result (1 in 5400 Down syndrome risk and 1 in 2200 trisomy 18 risk) and her age related risk for fetal aneuploidy, the adjusted risk for fetal aneuploidy is estimated to be less than 0.5%.  However, the level of one of the proteins analyzed on her first trimester  screen, PAPP-A was low. This has been associated with an increased risk for growth restriction or poor pregnancy outcome later in pregnancy; therefore, we would recommend a follow up ultrasound for fetal growth in the third trimester. We reviewed chromosomes, nondisjunction, and the common features of Down syndrome.    This couple was then counseled regarding the availability of amniocentesis including the associated risks, benefits, and limitations. We reviewed the approximate 1 in 300-500 risk for complications from amniocentesis, including spontaneous preterm labor and delivery. They understand that chromosome analysis can be performed both prenatally (amniotic fluid) and postnatally (peripheral blood or cord blood), if warranted.  Given Ms. Toni AmendCourtney Kilby's advanced gestational age, this couple declined amniocentesis for karyotype.  We also discussed the screening option of noninvasive prenatal screening (NIPS)/cell free DNA testing. We reviewed that NIPS/cffDNA testing analyzes cell free DNA (from the placenta) in maternal circulation for the presence of extra or missing fetal chromosome material for chromosomes 21, 18, 13, X, and Y. Additionally, screening for some microdeletion syndromes is available via certain NIPS platforms.  We reviewed the sensitivity, specificity, and false positive rates of this technology.  They understand that while this testing is highly accurate, it is not considered to be diagnostic.  It also does not screen for all chromosome or genetic conditions. After careful consideration, Ms. [name] [name] declined NIPS.   We then discussed other possible explanations for the above discussed ultrasound findings including single gene conditions.  Single gene conditions are typically tested for postnatally, based on the recommendation of a medical geneticist, unless ultrasound findings or the family history are strongly suggestive of a specific syndrome. We discussed that the ultrasound finding  of shortened  long bones can be indicative of an underlying skeletal dysplasia. However, we discussed the that ultrasound findings today are not indicative of a particular skeletal dysplasia at this time. We discussed that achondroplasia is the most common of the skeletal dysplasias, but still relatively rare, with an incidence of approximately 1 in 26,000 individuals.  Prenatal single gene testing is available for some skeletal dysplasia conditions via amniocentesis. This couple declined amniocentesis, given the risk for preterm delivery and given the advanced gestational age.  We reviewed the availability of a postnatal consult with a medical geneticist to assess for features of an underlying skeletal dysplasia, if warranted.    They were also counseled that shortened long bones can be a feature in a variety of other genetic conditions, but can also be a normal variant of growth or the result of familial or constitutional short stature.  We discussed that the prognosis and postnatal management depend on the underlying etiology of the shortened bones.  The couple stated that they are comfortable with all of this information and do not desire additional screening or testing regarding possible underlying etiologies of shortened long bones in the pregnancy. They stated that they would prefer to wait until birth and pursue additional evaluation/testing at that time, if it is warranted.   Both family histories were reviewed in detail and found to be noncontributory for birth defects, mental retardation, and known genetic conditions- specifically, skeletal dysplasias.  Ms.Kilby reported that she is approximately 5'0". She stated that her sister is slightly taller that she is. The patient described her parents as being "not very tall," and she reported that her paternal grandmother was less than 5'0" tall. The couple also reported that their daughter, currently age 28 years old, has less than average height.    Ms.  [name] [name] reported that one of her sister's sons has a "rare brain disorder." She did not recall the name of the condition at the time of today's visit. She reported that her sister was unaware of the pregnancy until near the date of delivery. He reportedly had a stroke, either in utero or around the time of delivery. The patient did not think that his condition was due to an underlying genetic cause, in which case, recurrence risk would not be expected to be increased for relatives. Additional information is needed to assess the recurrence risk for relatives. Without further information regarding the provided family history, an accurate genetic risk cannot be calculated. Further genetic counseling is warranted if more information is obtained.  Mrs. Benson NorwayCourtney Kilby denied exposure to environmental toxins or chemical agents. She denied the use of alcohol, tobacco or street drugs. She reported having pneumonia and bronchitis earlier in the pregnancy, which required hospitalization. She stated that she had one chest x ray during that time. The dose of ionizing radiation is an important factor in determining the potential toxicity to a pregnancy. Available study data suggest x ray exposure at 5 rad or less is not associated with an increased risk for birth defects. Given the reported x ray, the dose would be expected to be below this threshold. Additional information regarding her past x ray and the dose of ionizing radiation may alter risk assessment.   I counseled this couple regarding the above risks and available options.  The approximate face-to-face time with the genetic counselor was 20 minutes.    Quinn PlowmanKaren Dashton Czerwinski, MS Certified Genetic Counselor 04/14/2013

## 2013-04-24 ENCOUNTER — Emergency Department (HOSPITAL_COMMUNITY): Payer: Medicaid Other

## 2013-04-24 ENCOUNTER — Encounter (HOSPITAL_COMMUNITY): Payer: Self-pay | Admitting: Emergency Medicine

## 2013-04-24 ENCOUNTER — Emergency Department (HOSPITAL_COMMUNITY)
Admission: EM | Admit: 2013-04-24 | Discharge: 2013-04-24 | Disposition: A | Discharge status: Home / Self Care | Payer: Medicaid Other | Attending: Emergency Medicine | Admitting: Emergency Medicine

## 2013-04-24 DIAGNOSIS — F329 Major depressive disorder, single episode, unspecified: Secondary | ICD-10-CM | POA: Insufficient documentation

## 2013-04-24 DIAGNOSIS — M79609 Pain in unspecified limb: Secondary | ICD-10-CM | POA: Insufficient documentation

## 2013-04-24 DIAGNOSIS — N61 Mastitis without abscess: Secondary | ICD-10-CM | POA: Insufficient documentation

## 2013-04-24 DIAGNOSIS — Z79899 Other long term (current) drug therapy: Secondary | ICD-10-CM | POA: Insufficient documentation

## 2013-04-24 DIAGNOSIS — Z8701 Personal history of pneumonia (recurrent): Secondary | ICD-10-CM | POA: Insufficient documentation

## 2013-04-24 DIAGNOSIS — J45909 Unspecified asthma, uncomplicated: Secondary | ICD-10-CM | POA: Insufficient documentation

## 2013-04-24 DIAGNOSIS — F3289 Other specified depressive episodes: Secondary | ICD-10-CM | POA: Insufficient documentation

## 2013-04-24 DIAGNOSIS — Z8489 Family history of other specified conditions: Secondary | ICD-10-CM | POA: Insufficient documentation

## 2013-04-24 DIAGNOSIS — Z881 Allergy status to other antibiotic agents status: Secondary | ICD-10-CM | POA: Insufficient documentation

## 2013-04-24 DIAGNOSIS — M79642 Pain in left hand: Secondary | ICD-10-CM

## 2013-04-24 DIAGNOSIS — M7989 Other specified soft tissue disorders: Secondary | ICD-10-CM | POA: Insufficient documentation

## 2013-04-24 DIAGNOSIS — L259 Unspecified contact dermatitis, unspecified cause: Secondary | ICD-10-CM | POA: Insufficient documentation

## 2013-04-24 DIAGNOSIS — Z87891 Personal history of nicotine dependence: Secondary | ICD-10-CM | POA: Insufficient documentation

## 2013-04-24 DIAGNOSIS — Z331 Pregnant state, incidental: Secondary | ICD-10-CM | POA: Insufficient documentation

## 2013-04-24 LAB — URINALYSIS, ROUTINE W REFLEX MICROSCOPIC
Bilirubin Urine: NEGATIVE
Glucose, UA: NEGATIVE mg/dL
Hgb urine dipstick: NEGATIVE
KETONES UR: NEGATIVE mg/dL
LEUKOCYTES UA: NEGATIVE
Nitrite: NEGATIVE
PROTEIN: NEGATIVE mg/dL
Specific Gravity, Urine: 1.025 (ref 1.005–1.030)
UROBILINOGEN UA: 0.2 mg/dL (ref 0.0–1.0)
pH: 7 (ref 5.0–8.0)

## 2013-04-24 NOTE — ED Provider Notes (Signed)
CSN: [id number]     Arrival date & time 04/24/13  1753 History  This chart was scribed for non-physician practitioner, Earley FavorGail Thara Searing, FNP working with Shon Batonourtney F Horton, MD by Greggory StallionKayla Andersen, ED scribe. This patient was seen in room TR07C/TR07C and the patient's care was started at 6:18 PM.   Chief Complaint  Patient presents with  . Hand Pain   The history is provided by the patient. No language interpreter was used.   HPI Comments: [name] [name] is a 28 y.o. female who presents to the Emergency Department complaining of worsening left hand pain that started yesterday. She states there is also mild swelling to her hand and increased swelling in her left fingers. Denies injury or straining. Pt states she feels a knot in her hand. Palpation and certain movements worsen the pain. Pt's last OB visit was 1.5 weeks ago and her next one is 4 days ago. Denies history of preeclampsia in this pregnancy.   Past Medical History  Diagnosis Date  . Asthma   . Pregnant state, incidental   . Breast abscess   . Family history of anesthesia complication     Grandmother has difficulty waskin up.  . Depression     HX of - not on med n, doing well.  . Pneumonia     years ago has had a few times.  . Eczema    Past Surgical History  Procedure Laterality Date  . No past surgeries    . Incise and drain abcess      left breast  . Incision and drainage abscess Left 02/17/2012    Procedure:  DRAINAGEof recurrent breast abscess;  Surgeon: Shelly Rubensteinouglas A Blackman, MD;  Location: [facility] OR;  Service: General;  Laterality: Left;   Family History  Problem Relation Age of Onset  . Healthy Mother   . Healthy Father   . Stroke Maternal Grandmother   . Stroke Paternal Grandfather    History  Substance Use Topics  . Smoking status: Former Smoker    Quit date: 06/22/2010  . Smokeless tobacco: Never Used  . Alcohol Use: No   OB History   Grav Para Term Preterm Abortions TAB SAB Ect Mult Living   2 1  1      1       Review of Systems  Musculoskeletal: Positive for arthralgias and joint swelling.  All other systems reviewed and are negative.  Allergies  Doxycycline  Home Medications   Prior to Admission medications   Medication Sig Start Date End Date Taking? Authorizing Provider  acetaminophen-codeine (TYLENOL #3) 300-30 MG per tablet Take 1-2 tablets by mouth every 6 (six) hours as needed for moderate pain. 12/01/12   Juliet RudeNathan R. Pickering, MD  albuterol (PROVENTIL HFA;VENTOLIN HFA) 108 (90 BASE) MCG/ACT inhaler Inhale 2 puffs into the lungs every 4 (four) hours. For the next 2 days. After that use as needed every 4 hours. 01/04/13   Glori LuisEric G Sonnenberg, MD  albuterol (PROVENTIL) (5 MG/ML) 0.5% nebulizer solution Take 1 mL (5 mg total) by nebulization every 4 (four) hours as needed for wheezing or shortness of breath. 01/04/13   Glori LuisEric G Sonnenberg, MD  fluticasone Aleda Grana(FLONASE) 50 MCG/ACT nasal spray 2 sprays each nostril daily 10/19/12   [name], MD  hydrOXYzine (ATARAX/VISTARIL) 10 MG tablet Take 1 tablet (10 mg total) by mouth 3 (three) times daily as needed for itching. 04/03/13   Twana FirstBryan R Hess, DO  loratadine (CLARITIN) 10 MG tablet Take 1 tablet (10 mg  total) by mouth daily. 10/14/12   Josalyn C Funches, MD  mometasone-formoterol (DULERA) 200-5 MCG/ACT AERO Inhale 2 puffs into the lungs 2 (two) times daily. 01/04/13   Glori LuisEric G Sonnenberg, MD  montelukast (SINGULAIR) 10 MG tablet Take 1 tablet (10 mg total) by mouth at bedtime. 10/14/12   Lora PaulaJosalyn C Funches, MD  Prenatal Vit-Fe Fumarate-FA (PRENATAL MULTIVITAMIN) TABS tablet Take 1 tablet by mouth daily at 12 noon. 10/25/12   Ozella Rocksavid J Merrell, MD  triamcinolone (KENALOG) 0.025 % ointment Apply 1 application topically 2 (two) times daily. 02/26/13   [name], MD   BP 136/71  Pulse 121  Temp(Src) 98.5 F (36.9 C) (Oral)  Resp 16  Ht 5' (1.524 m)  Wt 183 lb (83.008 kg)  BMI 35.74 kg/m2  SpO2 99%  LMP 08/20/2012  Physical Exam  Nursing  note and vitals reviewed. Constitutional: She is oriented to person, place, and time. She appears well-developed and well-nourished. No distress.  HENT:  Head: Normocephalic and atraumatic.  Eyes: EOM are normal.  Neck: Neck supple. No tracheal deviation present.  Cardiovascular: Normal rate.   Pulmonary/Chest: Effort normal. No respiratory distress.  Musculoskeletal: Normal range of motion.  Neurological: She is alert and oriented to person, place, and time.  Skin: Skin is warm and dry.  Psychiatric: She has a normal mood and affect. Her behavior is normal.    ED Course  Procedures (including critical care time)  DIAGNOSTIC STUDIES: Oxygen Saturation is 99% on RA, normal by my interpretation.    COORDINATION OF CARE: 6:20 PM-Discussed treatment plan which includes lab work and speaking with Dr. Wilkie AyeHorton with pt at bedside and pt agreed to plan.   Labs Review Labs Reviewed  URINALYSIS, ROUTINE W REFLEX MICROSCOPIC - Abnormal; Notable for the following:    APPearance CLOUDY (*)    All other components within normal limits    Imaging Review Dg Hand Complete Left  04/24/2013   CLINICAL DATA:  Left hand swelling, pain  EXAM: LEFT HAND - COMPLETE 3+ VIEW  COMPARISON:  None.  FINDINGS: Rings remain on the left fourth finger. Normal alignment without acute fracture. Preserved joint spaces. No significant arthropathy.  IMPRESSION: No acute osseous finding   Electronically Signed   By: Ruel Favorsrevor  Shick [name]   On: 04/24/2013 20:49     EKG Interpretation None      MDM  X-ray is negative.  Due to patient's pregnancy had reluctant to prescribed a narcotic she cannot take anti-inflammatory.  I recommended she keep her hand elevated ice it as much as possible.  Every 2 hours and follow up with her primary care physician.  Urine was checked for protein.  Due to patient's history of the clamps, you she is not spilling protein Final diagnoses:  Hand pain, left       I personally performed  the services described in this documentation, which was scribed in my presence. The recorded information has been reviewed and is accurate.  Arman FilterGail K Reiley Keisler, NP 04/24/13 2110

## 2013-04-24 NOTE — ED Notes (Signed)
She states yesterday she was having mild pain in her L hand, then today woke and the pain was worse and she noticed posterior swelling also. No redness or bruising, denies any injuries, cms intact

## 2013-04-24 NOTE — Discharge Instructions (Signed)
Cryotherapy °Cryotherapy means treatment with cold. Ice or gel packs can be used to reduce both pain and swelling. Ice is the most helpful within the first 24 to 48 hours after an injury or flareup from overusing a muscle or joint. Sprains, strains, spasms, burning pain, shooting pain, and aches can all be eased with ice. Ice can also be used when recovering from surgery. Ice is effective, has very few side effects, and is safe for most people to use. °PRECAUTIONS  °Ice is not a safe treatment option for people with: °· Raynaud's phenomenon. This is a condition affecting small blood vessels in the extremities. Exposure to cold may cause your problems to return. °· Cold hypersensitivity. There are many forms of cold hypersensitivity, including: °· Cold urticaria. Red, itchy hives appear on the skin when the tissues begin to warm after being iced. °· Cold erythema. This is a red, itchy rash caused by exposure to cold. °· Cold hemoglobinuria. Red blood cells break down when the tissues begin to warm after being iced. The hemoglobin that carry oxygen are passed into the urine because they cannot combine with blood proteins fast enough. °· Numbness or altered sensitivity in the area being iced. °If you have any of the following conditions, do not use ice until you have discussed cryotherapy with your caregiver: °· Heart conditions, such as arrhythmia, angina, or chronic heart disease. °· High blood pressure. °· Healing wounds or open skin in the area being iced. °· Current infections. °· Rheumatoid arthritis. °· Poor circulation. °· Diabetes. °Ice slows the blood flow in the region it is applied. This is beneficial when trying to stop inflamed tissues from spreading irritating chemicals to surrounding tissues. However, if you expose your skin to cold temperatures for too long or without the proper protection, you can damage your skin or nerves. Watch for signs of skin damage due to cold. °HOME CARE INSTRUCTIONS °Follow  these tips to use ice and cold packs safely. °· Place a dry or damp towel between the ice and skin. A damp towel will cool the skin more quickly, so you may need to shorten the time that the ice is used. °· For a more rapid response, add gentle compression to the ice. °· Ice for no more than 10 to 20 minutes at a time. The bonier the area you are icing, the less time it will take to get the benefits of ice. °· Check your skin after 5 minutes to make sure there are no signs of a poor response to cold or skin damage. °· Rest 20 minutes or more in between uses. °· Once your skin is numb, you can end your treatment. You can test numbness by very lightly touching your skin. The touch should be so light that you do not see the skin dimple from the pressure of your fingertip. When using ice, most people will feel these normal sensations in this order: cold, burning, aching, and numbness. °· Do not use ice on someone who cannot communicate their responses to pain, such as small children or people with dementia. °HOW TO MAKE AN ICE PACK °Ice packs are the most common way to use ice therapy. Other methods include ice massage, ice baths, and cryo-sprays. Muscle creams that cause a cold, tingly feeling do not offer the same benefits that ice offers and should not be used as a substitute unless recommended by your caregiver. °To make an ice pack, do one of the following: °· Place crushed ice or   a bag of frozen vegetables in a sealable plastic bag. Squeeze out the excess air. Place this bag inside another plastic bag. Slide the bag into a pillowcase or place a damp towel between your skin and the bag.  Mix 3 parts water with 1 part rubbing alcohol. Freeze the mixture in a sealable plastic bag. When you remove the mixture from the freezer, it will be slushy. Squeeze out the excess air. Place this bag inside another plastic bag. Slide the bag into a pillowcase or place a damp towel between your skin and the bag. SEEK MEDICAL  CARE IF:  You develop white spots on your skin. This may give the skin a blotchy (mottled) appearance.  Your skin turns blue or pale.  Your skin becomes waxy or hard.  Your swelling gets worse. MAKE SURE YOU:   Understand these instructions.  Will watch your condition.  Will get help right away if you are not doing well or get worse. Document Released: 08/15/2010 Document Revised: 03/13/2011 Document Reviewed: 08/15/2010 [facility] Patient Information 2014 [employer] [place]. No fracture or abnormality noticed on your x-ray, your not spilling protein in your urine, I do not, think the swelling is related to your pregnancy.  Please keep your hand elevated as much as possible.  Take Tylenol on a regular basis, and use ice 15-20 minutes every 2-3 hours.  Followup with your primary care physician

## 2013-04-25 ENCOUNTER — Ambulatory Visit (INDEPENDENT_AMBULATORY_CARE_PROVIDER_SITE_OTHER): Payer: Medicaid Other | Admitting: Family Medicine

## 2013-04-25 ENCOUNTER — Other Ambulatory Visit: Payer: Self-pay | Admitting: Family Medicine

## 2013-04-25 ENCOUNTER — Encounter: Payer: Self-pay | Admitting: Family Medicine

## 2013-04-25 VITALS — BP 122/60 | HR 132 | Temp 98.6°F | Ht 60.0 in | Wt 184.0 lb

## 2013-04-25 DIAGNOSIS — M654 Radial styloid tenosynovitis [de Quervain]: Secondary | ICD-10-CM

## 2013-04-25 DIAGNOSIS — O09299 Supervision of pregnancy with other poor reproductive or obstetric history, unspecified trimester: Secondary | ICD-10-CM

## 2013-04-25 DIAGNOSIS — O09293 Supervision of pregnancy with other poor reproductive or obstetric history, third trimester: Secondary | ICD-10-CM

## 2013-04-25 NOTE — Assessment & Plan Note (Signed)
BP elevated on first check with automatic cuff, repeat by nurse SBP 130, repeat by me SBP 122. No symptoms of pre-eclampsia. Patient is aware of signs/symptoms to pay attention to. She has f/u on Monday with her OBGYN.

## 2013-04-25 NOTE — Progress Notes (Signed)
Patient ID: [name] [name], female   DOB: Jul 30, 1985, 28 y.o.   MRN: [id number]   Subjective:    Patient ID: [name] [name], female    DOB: Jul 30, 1985, 28 y.o.   MRN: [id number]  HPI  [name] [name] is a 28 y.o. G2P0101 at 462w3d here for non-OB visit  CC: left thumb pain and swelling  # Left thumb pain, swelling:  Started 2 nights ago with swelling  Went to ED yesterday after she developed pain. X-ray of hand was unremarkable  Pain is "severe", feels like strain and "knot", started at base/back of thumb but today has also started to travel midway up forearm  Has taken tylenol (2 tabs reg strength) every 6 hours, ices area for 15 minutes every few hours, no relief of pain over past day  Denies recent trauma, has been playing outside in [place] needles recently with other children. She thinks in the last 2 years she sprained her thumb but otherwise has not had problems with this before ROS: denies fevers, chills, nausea/vomiting  # Hx of pre-eclampsia  BP elevated on first read today  Pt is being closely monitored for pre-eclampsia by OBGYN  Urine tested in ED yesterday with no protein noted (reviewed today by me) ROS: no headache, no changes in vision, no RUQ pain, no edema of feet  Review of Systems   See HPI for ROS. Objective:  BP 157/70  Pulse 132  Temp(Src) 98.6 F (37 C) (Oral)  Ht 5' (1.524 m)  Wt 184 lb (83.462 kg)  BMI 35.94 kg/m2  LMP 08/20/2012  Repeat BP by me 122/60  General: NAD Extremities: Left thumb tender to palpation throughout and up to forearm, appears mildly swollen at base compared to right, no erythema noted or warmth. Finkelstein's maneuver positive on left. no LE edema or cyanosis. WWP. Skin: warm and dry, no rashes noted Neuro: alert and oriented, no focal deficits. Patellar reflexes 2+ bilaterally.     Assessment & Plan:  See Problem List Documentation

## 2013-04-25 NOTE — Assessment & Plan Note (Signed)
Suspect pain is de quervain's tenosynovitis vs other MSK tendonitis.  P: information given for thumb spica splint. Repeat Finkelstein's maneuver to stretch tendons throuhgout day. Cotninue icing, elevation, tylenol for pain. Avoid NSAIDs due to pregnancy.

## 2013-04-25 NOTE — Patient Instructions (Signed)
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  Continuing icing the area for 15 minutes every few hours, elevate as you can tolerate. Do the [facility] maneuver as I showed you today in clinic. Sets of 20 stretches, each held for five seconds, should be performed daily.  If you would like to buy the splint on your own, you can go to a medical goods store and buy a wrist/thumb spica splint (this has a piece of metal or plastic to keep the thumb in the correct position, it is not just a regular wrist splint).

## 2013-04-25 NOTE — ED Provider Notes (Signed)
Medical screening examination/treatment/procedure(s) were performed by non-physician practitioner and as supervising physician I was immediately available for consultation/collaboration.   EKG Interpretation None       Adelaida F Catalea Labrecque, MD 04/25/13 1433 

## 2013-04-28 LAB — OB RESULTS CONSOLE GBS: GBS: NEGATIVE

## 2013-05-08 ENCOUNTER — Encounter (HOSPITAL_COMMUNITY): Payer: Self-pay | Admitting: *Deleted

## 2013-05-08 ENCOUNTER — Inpatient Hospital Stay (HOSPITAL_COMMUNITY)
Admission: AD | Admit: 2013-05-08 | Discharge: 2013-05-08 | Disposition: A | Discharge status: Home / Self Care | Payer: Medicaid Other | Source: Ambulatory Visit | Attending: Obstetrics and Gynecology | Admitting: Obstetrics and Gynecology

## 2013-05-08 DIAGNOSIS — N898 Other specified noninflammatory disorders of vagina: Secondary | ICD-10-CM | POA: Insufficient documentation

## 2013-05-08 DIAGNOSIS — O99891 Other specified diseases and conditions complicating pregnancy: Secondary | ICD-10-CM | POA: Insufficient documentation

## 2013-05-08 DIAGNOSIS — O9989 Other specified diseases and conditions complicating pregnancy, childbirth and the puerperium: Principal | ICD-10-CM

## 2013-05-08 LAB — WET PREP, GENITAL
Trich, Wet Prep: NONE SEEN
Yeast Wet Prep HPF POC: NONE SEEN

## 2013-05-08 LAB — POCT FERN TEST: POCT FERN TEST: NEGATIVE

## 2013-05-08 NOTE — Discharge Instructions (Signed)
Braxton Hicks Contractions Pregnancy is commonly associated with contractions of the uterus throughout the pregnancy. Towards the end of pregnancy (32 to 34 weeks), these contractions (Braxton Hicks) can develop more often and may become more forceful. This is not true labor because these contractions do not result in opening (dilatation) and thinning of the cervix. They are sometimes difficult to tell apart from true labor because these contractions can be forceful and people have different pain tolerances. You should not feel embarrassed if you go to the hospital with false labor. Sometimes, the only way to tell if you are in true labor is for your caregiver to follow the changes in the cervix. How to tell the difference between true and false labor:  False labor.  The contractions of false labor are usually shorter, irregular and not as hard as those of true labor.  They are often felt in the front of the lower abdomen and in the groin.  They may leave with walking around or changing positions while lying down.  They get weaker and are shorter lasting as time goes on.  These contractions are usually irregular.  They do not usually become progressively stronger, regular and closer together as with true labor.  True labor.  Contractions in true labor last 30 to 70 seconds, become very regular, usually become more intense, and increase in frequency.  They do not go away with walking.  The discomfort is usually felt in the top of the uterus and spreads to the lower abdomen and low back.  True labor can be determined by your caregiver with an exam. This will show that the cervix is dilating and getting thinner. If there are no prenatal problems or other health problems associated with the pregnancy, it is completely safe to be sent home with false labor and await the onset of true labor. HOME CARE INSTRUCTIONS   Keep up with your usual exercises and instructions.  Take medications as  directed.  Keep your regular prenatal appointment.  Eat and drink lightly if you think you are going into labor.  If BH contractions are making you uncomfortable:  Change your activity position from lying down or resting to walking/walking to resting.  Sit and rest in a tub of warm water.  Drink 2 to 3 glasses of water. Dehydration may cause B-H contractions.  Do slow and deep breathing several times an hour. SEEK IMMEDIATE MEDICAL CARE IF:   Your contractions continue to become stronger, more regular, and closer together.  You have a gushing, burst or leaking of fluid from the vagina.  An oral temperature above 102 F (38.9 C) develops.  You have passage of blood-tinged mucus.  You develop vaginal bleeding.  You develop continuous belly (abdominal) pain.  You have low back pain that you never had before.  You feel the baby's head pushing down causing pelvic pressure.  The baby is not moving as much as it used to. Document Released: 12/19/2004 Document Revised: 03/13/2011 Document Reviewed: 09/30/2012 Exit[employer] Patient Information 2014 [employer].  Fetal Movement Counts Patient Name: __________________________________________________ Patient Due Date: ____________________ Performing a fetal movement count is highly recommended in high-risk pregnancies, but it is good for every pregnant woman to do. Your caregiver may ask you to start counting fetal movements at 28 weeks of the pregnancy. Fetal movements often increase:  After eating a full meal.  After physical activity.  After eating or drinking something sweet or cold.  At rest. Pay attention to when you feel   the baby is most active. This will help you notice a pattern of your baby's sleep and wake cycles and what factors contribute to an increase in fetal movement. It is important to perform a fetal movement count at the same time each day when your baby is normally most active.  HOW TO COUNT FETAL  MOVEMENTS 1. Find a quiet and comfortable area to sit or lie down on your left side. Lying on your left side provides the best blood and oxygen circulation to your baby. 2. Write down the day and time on a sheet of paper or in a journal. 3. Start counting kicks, flutters, swishes, rolls, or jabs in a 2 hour period. You should feel at least 10 movements within 2 hours. 4. If you do not feel 10 movements in 2 hours, wait 2 3 hours and count again. Look for a change in the pattern or not enough counts in 2 hours. SEEK MEDICAL CARE IF:  You feel less than 10 counts in 2 hours, tried twice.  There is no movement in over an hour.  The pattern is changing or taking longer each day to reach 10 counts in 2 hours.  You feel the baby is not moving as he or she usually does. Date: ____________ Movements: ____________ Start time: ____________ Finish time: ____________  Date: ____________ Movements: ____________ Start time: ____________ Finish time: ____________ Date: ____________ Movements: ____________ Start time: ____________ Finish time: ____________ Date: ____________ Movements: ____________ Start time: ____________ Finish time: ____________ Date: ____________ Movements: ____________ Start time: ____________ Finish time: ____________ Date: ____________ Movements: ____________ Start time: ____________ Finish time: ____________ Date: ____________ Movements: ____________ Start time: ____________ Finish time: ____________ Date: ____________ Movements: ____________ Start time: ____________ Finish time: ____________  Date: ____________ Movements: ____________ Start time: ____________ Finish time: ____________ Date: ____________ Movements: ____________ Start time: ____________ Finish time: ____________ Date: ____________ Movements: ____________ Start time: ____________ Finish time: ____________ Date: ____________ Movements: ____________ Start time: ____________ Finish time: ____________ Date: ____________  Movements: ____________ Start time: ____________ Finish time: ____________ Date: ____________ Movements: ____________ Start time: ____________ Finish time: ____________ Date: ____________ Movements: ____________ Start time: ____________ Finish time: ____________  Date: ____________ Movements: ____________ Start time: ____________ Finish time: ____________ Date: ____________ Movements: ____________ Start time: ____________ Finish time: ____________ Date: ____________ Movements: ____________ Start time: ____________ Finish time: ____________ Date: ____________ Movements: ____________ Start time: ____________ Finish time: ____________ Date: ____________ Movements: ____________ Start time: ____________ Finish time: ____________ Date: ____________ Movements: ____________ Start time: ____________ Finish time: ____________ Date: ____________ Movements: ____________ Start time: ____________ Finish time: ____________  Date: ____________ Movements: ____________ Start time: ____________ Finish time: ____________ Date: ____________ Movements: ____________ Start time: ____________ Finish time: ____________ Date: ____________ Movements: ____________ Start time: ____________ Finish time: ____________ Date: ____________ Movements: ____________ Start time: ____________ Finish time: ____________ Date: ____________ Movements: ____________ Start time: ____________ Finish time: ____________ Date: ____________ Movements: ____________ Start time: ____________ Finish time: ____________ Date: ____________ Movements: ____________ Start time: ____________ Finish time: ____________  Date: ____________ Movements: ____________ Start time: ____________ Finish time: ____________ Date: ____________ Movements: ____________ Start time: ____________ Finish time: ____________ Date: ____________ Movements: ____________ Start time: ____________ Finish time: ____________ Date: ____________ Movements: ____________ Start time:  ____________ Finish time: ____________ Date: ____________ Movements: ____________ Start time: ____________ Finish time: ____________ Date: ____________ Movements: ____________ Start time: ____________ Finish time: ____________ Date: ____________ Movements: ____________ Start time: ____________ Finish time: ____________  Date: ____________ Movements: ____________ Start time: ____________ Finish time: ____________ Date: ____________ Movements: ____________ Start   time: ____________ Finish time: ____________ Date: ____________ Movements: ____________ Start time: ____________ Finish time: ____________ Date: ____________ Movements: ____________ Start time: ____________ Finish time: ____________ Date: ____________ Movements: ____________ Start time: ____________ Finish time: ____________ Date: ____________ Movements: ____________ Start time: ____________ Finish time: ____________ Date: ____________ Movements: ____________ Start time: ____________ Finish time: ____________  Date: ____________ Movements: ____________ Start time: ____________ Finish time: ____________ Date: ____________ Movements: ____________ Start time: ____________ Finish time: ____________ Date: ____________ Movements: ____________ Start time: ____________ Finish time: ____________ Date: ____________ Movements: ____________ Start time: ____________ Finish time: ____________ Date: ____________ Movements: ____________ Start time: ____________ Finish time: ____________ Date: ____________ Movements: ____________ Start time: ____________ Finish time: ____________ Date: ____________ Movements: ____________ Start time: ____________ Finish time: ____________  Date: ____________ Movements: ____________ Start time: ____________ Finish time: ____________ Date: ____________ Movements: ____________ Start time: ____________ Finish time: ____________ Date: ____________ Movements: ____________ Start time: ____________ Finish time: ____________ Date:  ____________ Movements: ____________ Start time: ____________ Finish time: ____________ Date: ____________ Movements: ____________ Start time: ____________ Finish time: ____________ Date: ____________ Movements: ____________ Start time: ____________ Finish time: ____________ Document Released: 01/18/2006 Document Revised: 12/06/2011 Document Reviewed: 10/16/2011 [employer] Patient Information 2014 [employer].  

## 2013-05-08 NOTE — MAU Provider Note (Signed)
S: 28 y.o. G2P0101 @[redacted]w[redacted]d  presents to MAU reporting leakage of clear fluid x 1 episode today soaking her underwear and onto her pants, then continued lighter leakage since then.  She is not wearing a pad for the leakage upon arrival to MAU.  She reports good fetal movement, denies LOF, vaginal bleeding, vaginal itching/burning, urinary symptoms, h/a, dizziness, n/v, or fever/chills.    O: BP 134/65  Pulse 119  Temp(Src) 97.9 F (36.6 C) (Oral)  Resp 18  LMP 08/20/2012  Pelvic exam: Cervix pink, visually 2-3 cm, without lesion, large amount frothy white discharge with odor, no pooling, no fluid from os with valsalva, vaginal walls and external genitalia normal Dilation: 2 Effacement (%): 50 Presentation: Vertex Exam by:: L. [name], CNM  Ferning negative on slide Wet prep sent  A: Vaginal discharge in pregnancy Membranes intact  P: RN to call Dr Sandi CarneHenley  [name] [name] Certified Nurse-Midwife

## 2013-05-08 NOTE — MAU Note (Signed)
Pt reports strong contractions last night but have subsided.  Around 1400 noticed a lot of watery discharge.  Denies VB.

## 2013-05-09 ENCOUNTER — Encounter (HOSPITAL_COMMUNITY): Payer: Self-pay

## 2013-05-09 ENCOUNTER — Inpatient Hospital Stay (HOSPITAL_COMMUNITY): Payer: Medicaid Other | Admitting: Anesthesiology

## 2013-05-09 ENCOUNTER — Encounter (HOSPITAL_COMMUNITY): Payer: Medicaid Other | Admitting: Anesthesiology

## 2013-05-09 ENCOUNTER — Inpatient Hospital Stay (HOSPITAL_COMMUNITY)
Admission: AD | Admit: 2013-05-09 | Discharge: 2013-05-11 | DRG: 775 | Disposition: A | Discharge status: Home / Self Care | Payer: Medicaid Other | Source: Ambulatory Visit | Attending: Obstetrics and Gynecology | Admitting: Obstetrics and Gynecology

## 2013-05-09 DIAGNOSIS — Z87891 Personal history of nicotine dependence: Secondary | ICD-10-CM

## 2013-05-09 DIAGNOSIS — J45909 Unspecified asthma, uncomplicated: Secondary | ICD-10-CM | POA: Diagnosis present

## 2013-05-09 DIAGNOSIS — O99214 Obesity complicating childbirth: Secondary | ICD-10-CM

## 2013-05-09 DIAGNOSIS — O411090 Infection of amniotic sac and membranes, unspecified, unspecified trimester, not applicable or unspecified: Principal | ICD-10-CM | POA: Diagnosis present

## 2013-05-09 DIAGNOSIS — E669 Obesity, unspecified: Secondary | ICD-10-CM | POA: Diagnosis present

## 2013-05-09 DIAGNOSIS — Z6836 Body mass index (BMI) 36.0-36.9, adult: Secondary | ICD-10-CM

## 2013-05-09 LAB — TYPE AND SCREEN
ABO/RH(D): A POS
Antibody Screen: NEGATIVE

## 2013-05-09 LAB — CBC
HEMATOCRIT: 35.8 % — AB (ref 36.0–46.0)
Hemoglobin: 12.3 g/dL (ref 12.0–15.0)
MCH: 32 pg (ref 26.0–34.0)
MCHC: 34.4 g/dL (ref 30.0–36.0)
MCV: 93.2 fL (ref 78.0–100.0)
Platelets: 431 10*3/uL — ABNORMAL HIGH (ref 150–400)
RBC: 3.84 MIL/uL — ABNORMAL LOW (ref 3.87–5.11)
RDW: 13.9 % (ref 11.5–15.5)
WBC: 29.2 10*3/uL — ABNORMAL HIGH (ref 4.0–10.5)

## 2013-05-09 LAB — RPR

## 2013-05-09 LAB — ABO/RH: ABO/RH(D): A POS

## 2013-05-09 MED ORDER — OXYCODONE-ACETAMINOPHEN 5-325 MG PO TABS
1.0000 | ORAL_TABLET | ORAL | Status: DC | PRN
  Administered 2013-05-09 – 2013-05-11 (×6): 1 via ORAL
  Filled 2013-05-09 (×6): qty 1

## 2013-05-09 MED ORDER — LACTATED RINGERS IV SOLN
INTRAVENOUS | Status: DC
  Administered 2013-05-09 (×2): via INTRAVENOUS

## 2013-05-09 MED ORDER — LIDOCAINE HCL (PF) 1 % IJ SOLN
INTRAMUSCULAR | Status: DC | PRN
  Administered 2013-05-09 (×4): 4 mL

## 2013-05-09 MED ORDER — ALBUTEROL SULFATE (2.5 MG/3ML) 0.083% IN NEBU
INHALATION_SOLUTION | RESPIRATORY_TRACT | Status: DC | PRN
  Filled 2013-05-09: qty 3

## 2013-05-09 MED ORDER — CALCIUM CARBONATE ANTACID 500 MG PO CHEW: 2.0000 | CHEWABLE_TABLET | Freq: Every day | ORAL | Status: DC | PRN

## 2013-05-09 MED ORDER — LIDOCAINE HCL (PF) 1 % IJ SOLN
30.0000 mL | INTRAMUSCULAR | Status: DC | PRN
  Filled 2013-05-09: qty 30

## 2013-05-09 MED ORDER — FENTANYL 2.5 MCG/ML BUPIVACAINE 1/10 % EPIDURAL INFUSION (WH - ANES)
14.0000 mL/h | INTRAMUSCULAR | Status: DC | PRN
  Administered 2013-05-09: 12 mL/h via EPIDURAL
  Filled 2013-05-09: qty 125

## 2013-05-09 MED ORDER — PHENYLEPHRINE 40 MCG/ML (10ML) SYRINGE FOR IV PUSH (FOR BLOOD PRESSURE SUPPORT)
80.0000 ug | PREFILLED_SYRINGE | INTRAVENOUS | Status: DC | PRN
  Filled 2013-05-09: qty 10
  Filled 2013-05-09: qty 2

## 2013-05-09 MED ORDER — IBUPROFEN 600 MG PO TABS: 600.0000 mg | ORAL_TABLET | Freq: Four times a day (QID) | ORAL | Status: DC | PRN

## 2013-05-09 MED ORDER — BUPIVACAINE HCL (PF) 0.25 % IJ SOLN
INTRAMUSCULAR | Status: DC | PRN
  Administered 2013-05-09: 5 mL

## 2013-05-09 MED ORDER — DIPHENHYDRAMINE HCL 50 MG/ML IJ SOLN: 12.5000 mg | INTRAMUSCULAR | Status: DC | PRN

## 2013-05-09 MED ORDER — SENNOSIDES-DOCUSATE SODIUM 8.6-50 MG PO TABS
2.0000 | ORAL_TABLET | ORAL | Status: DC
  Administered 2013-05-10 – 2013-05-11 (×2): 2 via ORAL
  Filled 2013-05-09 (×2): qty 2

## 2013-05-09 MED ORDER — ONDANSETRON HCL 4 MG PO TABS: 4.0000 mg | ORAL_TABLET | ORAL | Status: DC | PRN

## 2013-05-09 MED ORDER — PHENYLEPHRINE 40 MCG/ML (10ML) SYRINGE FOR IV PUSH (FOR BLOOD PRESSURE SUPPORT)
80.0000 ug | PREFILLED_SYRINGE | INTRAVENOUS | Status: DC | PRN
  Filled 2013-05-09: qty 2

## 2013-05-09 MED ORDER — DIPHENHYDRAMINE HCL 25 MG PO CAPS: 25.0000 mg | ORAL_CAPSULE | Freq: Four times a day (QID) | ORAL | Status: DC | PRN

## 2013-05-09 MED ORDER — LACTATED RINGERS IV SOLN
500.0000 mL | Freq: Once | INTRAVENOUS | Status: AC
  Administered 2013-05-09: 500 mL via INTRAVENOUS

## 2013-05-09 MED ORDER — EPHEDRINE 5 MG/ML INJ
10.0000 mg | INTRAVENOUS | Status: DC | PRN
  Filled 2013-05-09: qty 4
  Filled 2013-05-09: qty 2

## 2013-05-09 MED ORDER — CITRIC ACID-SODIUM CITRATE 334-500 MG/5ML PO SOLN: 30.0000 mL | ORAL | Status: DC | PRN

## 2013-05-09 MED ORDER — ZOLPIDEM TARTRATE 5 MG PO TABS: 5.0000 mg | ORAL_TABLET | Freq: Every evening | ORAL | Status: DC | PRN

## 2013-05-09 MED ORDER — MOMETASONE FURO-FORMOTEROL FUM 200-5 MCG/ACT IN AERO
2.0000 | INHALATION_SPRAY | Freq: Two times a day (BID) | RESPIRATORY_TRACT | Status: DC
  Administered 2013-05-09 – 2013-05-10 (×4): 2 via RESPIRATORY_TRACT
  Filled 2013-05-09: qty 8.8

## 2013-05-09 MED ORDER — EPHEDRINE 5 MG/ML INJ
10.0000 mg | INTRAVENOUS | Status: DC | PRN
  Filled 2013-05-09: qty 2

## 2013-05-09 MED ORDER — LACTATED RINGERS IV SOLN: INTRAVENOUS | Status: DC

## 2013-05-09 MED ORDER — LACTATED RINGERS IV SOLN: 500.0000 mL | INTRAVENOUS | Status: DC | PRN

## 2013-05-09 MED ORDER — OXYTOCIN BOLUS FROM INFUSION
500.0000 mL | INTRAVENOUS | Status: DC
  Administered 2013-05-09: 500 mL via INTRAVENOUS

## 2013-05-09 MED ORDER — FLEET ENEMA 7-19 GM/118ML RE ENEM: 1.0000 | ENEMA | RECTAL | Status: DC | PRN

## 2013-05-09 MED ORDER — SIMETHICONE 80 MG PO CHEW: 80.0000 mg | CHEWABLE_TABLET | ORAL | Status: DC | PRN

## 2013-05-09 MED ORDER — DIBUCAINE 1 % RE OINT: 1.0000 "application " | TOPICAL_OINTMENT | RECTAL | Status: DC | PRN

## 2013-05-09 MED ORDER — PNEUMOCOCCAL VAC POLYVALENT 25 MCG/0.5ML IJ INJ
0.5000 mL | INJECTION | INTRAMUSCULAR | Status: AC
  Administered 2013-05-10: 0.5 mL via INTRAMUSCULAR
  Filled 2013-05-09: qty 0.5

## 2013-05-09 MED ORDER — LORATADINE 10 MG PO TABS
10.0000 mg | ORAL_TABLET | Freq: Every day | ORAL | Status: DC
  Administered 2013-05-09 – 2013-05-11 (×3): 10 mg via ORAL
  Filled 2013-05-09 (×3): qty 1

## 2013-05-09 MED ORDER — IBUPROFEN 600 MG PO TABS
600.0000 mg | ORAL_TABLET | Freq: Four times a day (QID) | ORAL | Status: DC
  Administered 2013-05-09 – 2013-05-11 (×8): 600 mg via ORAL
  Filled 2013-05-09 (×8): qty 1

## 2013-05-09 MED ORDER — LANOLIN HYDROUS EX OINT: TOPICAL_OINTMENT | CUTANEOUS | Status: DC | PRN

## 2013-05-09 MED ORDER — OXYCODONE-ACETAMINOPHEN 5-325 MG PO TABS: 1.0000 | ORAL_TABLET | ORAL | Status: DC | PRN

## 2013-05-09 MED ORDER — ACETAMINOPHEN 325 MG PO TABS: 650.0000 mg | ORAL_TABLET | ORAL | Status: DC | PRN

## 2013-05-09 MED ORDER — BENZOCAINE-MENTHOL 20-0.5 % EX AERO
1.0000 "application " | INHALATION_SPRAY | CUTANEOUS | Status: DC | PRN
  Administered 2013-05-09: 1 via TOPICAL
  Filled 2013-05-09 (×2): qty 56

## 2013-05-09 MED ORDER — ONDANSETRON HCL 4 MG/2ML IJ SOLN: 4.0000 mg | Freq: Four times a day (QID) | INTRAMUSCULAR | Status: DC | PRN

## 2013-05-09 MED ORDER — ONDANSETRON HCL 4 MG/2ML IJ SOLN: 4.0000 mg | INTRAMUSCULAR | Status: DC | PRN

## 2013-05-09 MED ORDER — MONTELUKAST SODIUM 10 MG PO TABS
10.0000 mg | ORAL_TABLET | Freq: Every day | ORAL | Status: DC
  Administered 2013-05-09 – 2013-05-10 (×2): 10 mg via ORAL
  Filled 2013-05-09 (×2): qty 1

## 2013-05-09 MED ORDER — WITCH HAZEL-GLYCERIN EX PADS
1.0000 | MEDICATED_PAD | CUTANEOUS | Status: DC | PRN
Start: 2013-05-09 — End: 2013-05-11

## 2013-05-09 MED ORDER — OXYTOCIN 40 UNITS IN LACTATED RINGERS INFUSION - SIMPLE MED
62.5000 mL/h | INTRAVENOUS | Status: DC
  Filled 2013-05-09: qty 1000

## 2013-05-09 MED ORDER — HYDROXYZINE HCL 10 MG PO TABS
10.0000 mg | ORAL_TABLET | Freq: Three times a day (TID) | ORAL | Status: DC | PRN
  Filled 2013-05-09: qty 1

## 2013-05-09 MED ORDER — PRENATAL MULTIVITAMIN CH
1.0000 | ORAL_TABLET | Freq: Every day | ORAL | Status: DC
  Administered 2013-05-09 – 2013-05-10 (×2): 1 via ORAL
  Filled 2013-05-09 (×2): qty 1

## 2013-05-09 NOTE — H&P (Signed)
NAMVerdell Carmine:  [name], [name]         ACCOUNT NO.:  [id number]  MEDICAL RECORD NO.:  [id number]  LOCATION:  9164                          FACILITY:  [facility]  PHYSICIAN:  Malachi Prohomas F. Ambrose MantleHenley, [name] DATE OF BIRTH:  07-07-1985  DATE OF ADMISSION:  05/09/2013 DATE OF DISCHARGE:                             HISTORY & PHYSICAL   HISTORY OF PRESENT ILLNESS:  This is a 28 year old white female, para 0- 1-0-1, gravida 2 with EDC on May 27, 2013, admitted with contractions and cervical change.  Blood group and type A positive with a negative antibody.  RPR negative.  Urine culture negative.  Hepatitis B surface antigen negative, HIV negative, GC and Chlamydia negative.  Rubella immune.  Pap smear normal.  Cystic fibrosis screen negative.  First trimester screen negative.  AFP negative.  One hour Glucola 86.  Repeat HIV and RPR negative and group B strep negative.  The patient began her prenatal course early in pregnancy at about 11 weeks.  The ultrasound on October 04, 2012, 6 weeks and 3 days.  EDC on May 27, 2013, ultrasound on October 30, 2012, [facility] 10 weeks 3 days[facility]  Final [facility] was May 27, 2013.  The patient stated at 15 weeks that she had been hospitalized with worsening asthma.  Her ultrasound at 18 weeks showed a two-vessel cord with a low-lying placenta.  The ultrasound at 23 weeks showed the baby in the 37th percentile for growth, low lying placenta had resolved.  Ultrasound at 23 weeks question whether there was short and long bones.  The patient was advised at 32 weeks to follow up with the Maternal Fetal Medicine unit regarding the short and long bones. The patient was followed with twice weekly nonstress test because of a two-vessel cord.  They were all reactive.  The patient came to the hospital on the night of admission thinking that she had ruptured her membranes.  She was found not to have ruptured membranes, sent home apparently started having painful and regular  contractions.  Return to the hospital was thought to be 3+ cm dilated and was admitted.  At the present time, according to the nurse's exam she is 4+ cm, 50%, and -2 station.  PAST MEDICAL HISTORY:  Revealed a history of preeclampsia with her first pregnancy.  She has had eczema since she was a baby.  She had depression started on Celexa postpartum.  She uses a inhaler daily for her asthma.  PAST SURGICAL HISTORY:  In 2013 and 2014, a breast abscess was drained. Oral surgery, removal of teeth in her mid 7220s.  ALLERGIES:  Doxycycline causes photosensitivity.  No latex allergy.  No food allergies.  SOCIAL HISTORY:  The patient is a small former smoker quit in June 2012. Does not drink alcohol.  Denies illicit drugs.  Claims to have a 12th grade education.  She is a stay-at-home mom.  She is married since 2014.  FAMILY HISTORY:  Mother with history of anemia, sister with a history of anemia, maternal grandmother, arthritis, high blood pressure, heart attack and COPD.  Father with asthma and COPD.  Maternal grandfather, diabetes and high blood pressure.  Paternal grandfather, diabetes and high blood pressure and TIAs.  Paternal grandmother high blood pressure.  PHYSICAL EXAMINATION:  VITAL SIGNS:  On admission; temperature 98.7, pulse 107, respirations 18, blood pressure 145/77. HEART:  Normal size and sounds.  No murmurs. LUNGS:  Clear with a possible occasional wheeze.  The fundal height at her last prenatal visit on May 05, 2013, was 37 cm.  Fetal heart tones are normal.  Cervix per the RN is 4+ cm 50%, vertex at a -2, intact membranes.  ADMITTING IMPRESSION:  Intrauterine pregnancy at 37 weeks and 3 days, early labor, asthma, history of a possible short and long bones on ultrasound, 2 vessel umbilical cord.  The patient is admitted for observation of labor.     Malachi Prohomas F. Ambrose MantleHenley, [name]     TFH/MEDQ  D:  05/09/2013  T:  05/09/2013  Job:  [id number]

## 2013-05-09 NOTE — Anesthesia Procedure Notes (Signed)
Epidural Patient location during procedure: OB Start time: 05/09/2013 5:28 AM  Staffing Performed by: anesthesiologist   Preanesthetic Checklist Completed: patient identified, site marked, surgical consent, pre-op evaluation, timeout performed, IV checked, risks and benefits discussed and monitors and equipment checked  Epidural Patient position: sitting Prep: site prepped and draped and DuraPrep Patient monitoring: continuous pulse ox and blood pressure Approach: midline Injection technique: LOR air  Needle:  Needle type: Tuohy  Needle gauge: 17 G Needle length: 9 cm and 9 Needle insertion depth: 6 cm Catheter type: closed end flexible Catheter size: 19 Gauge Catheter at skin depth: 11 cm Test dose: negative  Assessment Events: blood not aspirated, injection not painful, no injection resistance, negative IV test and no paresthesia  Additional Notes Discussed risk of headache, infection, bleeding, nerve injury and failed or incomplete block.  Patient voices understanding and wishes to proceed.  Epidural placed easily on first attempt.  No paresthesia.  Patient tolerated procedure well with no apparent complications.  Jasmine DecemberA. Cianni Manny, MDReason for block:procedure for pain

## 2013-05-09 NOTE — Progress Notes (Signed)
Patient ID: [name] NeptuneCourtney Okuda, female   DOB: 1985-07-16, 28 y.o.   MRN: [id number] Pt has her epidural and is comfortable. The FHR is normal. The cervix is 3 cm 70% effaced and the vertex is at - 2 station. AROM produced meconium stained fluid with particulate meconium

## 2013-05-09 NOTE — Progress Notes (Signed)
Patient ID: [name] [name], female   DOB: 09-11-1985, 28 y.o.   MRN: [id number]  Micah FlesherWent to place IUPC due to variable and sever epitocin  Pt noted to be C/C/ +2 Plan to push for delivery  130's category 2 toco diff to monitor  Expect SVD

## 2013-05-09 NOTE — Anesthesia Postprocedure Evaluation (Signed)
  Anesthesia Post-op Note  Patient: [name] [name]  Procedure(s) Performed: * No procedures listed *  Patient Location: PACU and Mother/Baby  Anesthesia Type:Epidural  Level of Consciousness: awake, alert  and oriented  Airway and Oxygen Therapy: Patient Spontanous Breathing  Post-op Pain: mild  Post-op Assessment: Post-op Vital signs reviewed  Post-op Vital Signs: Reviewed and stable  Last Vitals:  Filed Vitals:   05/09/13 1419  BP: 96/59  Pulse: 86  Temp: 36.8 C  Resp: 18    Complications: No apparent anesthesia complications

## 2013-05-09 NOTE — Anesthesia Postprocedure Evaluation (Signed)
Anesthesia Post Note  Patient: [name] NeptuneCourtney Eagon  Procedure(s) Performed: * No procedures listed *  Anesthesia type: Epidural  Patient location: Mother/Baby  Post pain: Pain level controlled  Post assessment: Post-op Vital signs reviewed  Last Vitals:  Filed Vitals:   05/09/13 1419  BP: 96/59  Pulse: 86  Temp: 36.8 C  Resp: 18    Post vital signs: Reviewed  Level of consciousness:alert  Complications: No apparent anesthesia complications

## 2013-05-09 NOTE — Anesthesia Preprocedure Evaluation (Addendum)
Anesthesia Evaluation  Patient identified by MRN, date of birth, ID band Patient awake    Reviewed: Allergy & Precautions, H&P , NPO status , Patient's Chart, lab work & pertinent test results, reviewed documented beta blocker date and time   History of Anesthesia Complications Negative for: history of anesthetic complications  Airway Mallampati: III TM Distance: >3 FB Neck ROM: full    Dental  (+) Teeth Intact   Pulmonary asthma (uses rescue inhaler daily) , former smoker,  breath sounds clear to auscultation        Cardiovascular negative cardio ROS  Rhythm:regular Rate:Normal     Neuro/Psych Depression negative neurological ROS     GI/Hepatic negative GI ROS, Neg liver ROS,   Endo/Other  Obese - BMI 36  Renal/GU negative Renal ROS     Musculoskeletal   Abdominal   Peds  Hematology negative hematology ROS (+)   Anesthesia Other Findings Tongue piercing - asked to remove  Reproductive/Obstetrics (+) Pregnancy                          Anesthesia Physical Anesthesia Plan  ASA: II  Anesthesia Plan: Epidural   Post-op Pain Management:    Induction:   Airway Management Planned:   Additional Equipment:   Intra-op Plan:   Post-operative Plan:   Informed Consent: I have reviewed the patients History and Physical, chart, labs and discussed the procedure including the risks, benefits and alternatives for the proposed anesthesia with the patient or authorized representative who has indicated his/her understanding and acceptance.     Plan Discussed with:   Anesthesia Plan Comments:         Anesthesia Quick Evaluation

## 2013-05-10 LAB — CBC
HEMATOCRIT: 30.3 % — AB (ref 36.0–46.0)
Hemoglobin: 10.2 g/dL — ABNORMAL LOW (ref 12.0–15.0)
MCH: 31.8 pg (ref 26.0–34.0)
MCHC: 33.7 g/dL (ref 30.0–36.0)
MCV: 94.4 fL (ref 78.0–100.0)
Platelets: 372 10*3/uL (ref 150–400)
RBC: 3.21 MIL/uL — AB (ref 3.87–5.11)
RDW: 14.1 % (ref 11.5–15.5)
WBC: 22 10*3/uL — AB (ref 4.0–10.5)

## 2013-05-10 MED ORDER — PNEUMOCOCCAL VAC POLYVALENT 25 MCG/0.5ML IJ INJ
0.5000 mL | INJECTION | INTRAMUSCULAR | Status: DC
  Filled 2013-05-10: qty 0.5

## 2013-05-10 NOTE — Progress Notes (Signed)
Post Partum Day 1 Subjective: no complaints, up ad lib, voiding, tolerating PO and nl lochia, pain controlled  Objective: Blood pressure 100/64, pulse 91, temperature 98.3 F (36.8 C), temperature source Oral, resp. rate 18, height 5' (1.524 m), weight 83.462 kg (184 lb), last menstrual period 08/20/2012, SpO2 98.00%, unknown if currently breastfeeding.  Physical Exam:  General: alert and no distress Lochia: appropriate Uterine Fundus: firm  Recent Labs  05/09/13 0445 05/10/13 0620  HGB 12.3 10.2*  HCT 35.8* 30.3*    Assessment/Plan: Plan for discharge tomorrow.  Routine care.     LOS: 1 day   Accalia Rigdon Bovard-Stuckert 05/10/2013, 7:01 AM

## 2013-05-10 NOTE — Lactation Note (Signed)
This note was copied from the chart of Girl Manuela NeptuneCourtney Presley. Lactation Consultation Note  Patient Name: Girl Manuela NeptuneCourtney Ranganathan WUJWJ'XToday's Date: 05/10/2013 Reason for consult: Other (Comment) (charting for exclusion)   Maternal Data Formula Feeding for Exclusion: Yes Reason for exclusion: Mother's choice to formula feed on admision  Feeding    LATCH Score/Interventions                      Lactation Tools Discussed/Used     Consult Status Consult Status: Complete    Zara ChessJoanne P Giara Mcgaughey 05/10/2013, 9:24 PM

## 2013-05-11 MED ORDER — IBUPROFEN 800 MG PO TABS: 800.0000 mg | ORAL_TABLET | Freq: Three times a day (TID) | ORAL | Status: DC | PRN

## 2013-05-11 MED ORDER — OXYCODONE-ACETAMINOPHEN 5-325 MG PO TABS: 1.0000 | ORAL_TABLET | Freq: Four times a day (QID) | ORAL | Status: DC | PRN

## 2013-05-11 NOTE — Discharge Instructions (Signed)

## 2013-05-11 NOTE — Progress Notes (Signed)
Post Partum Day 2 Subjective: no complaints, up ad lib, voiding, tolerating PO and nl lochia, pain controlled  Objective: Blood pressure 96/62, pulse 89, temperature 97.8 F (36.6 C), temperature source Oral, resp. rate 18, height 5' (1.524 m), weight 83.462 kg (184 lb), last menstrual period 08/20/2012, SpO2 98.00%, unknown if currently breastfeeding.  Physical Exam:  General: alert and no distress Lochia: appropriate Uterine Fundus: firm  Recent Labs  05/09/13 0445 05/10/13 0620  HGB 12.3 10.2*  HCT 35.8* 30.3*    Assessment/Plan: Discharge home.  D/C with motrin, percocet and prenatal vitamins.  F/u 6 weeks.     LOS: 2 days   [name] [name] 05/11/2013, 7:53 AM

## 2013-05-11 NOTE — Discharge Summary (Signed)
Obstetric Discharge Summary Reason for Admission: onset of labor Prenatal Procedures: none Intrapartum Procedures: spontaneous vaginal delivery Postpartum Procedures: none Complications-Operative and Postpartum: none Hemoglobin  Date Value Ref Range Status  05/10/2013 10.2* 12.0 - 15.0 g/dL Final     REPEATED TO VERIFY     DELTA CHECK NOTED     HCT  Date Value Ref Range Status  05/10/2013 30.3* 36.0 - 46.0 % Final    Physical Exam:  General: alert and no distress Lochia: appropriate Uterine Fundus: firm  Discharge Diagnoses: Term Pregnancy-delivered  Discharge Information: Date: 05/11/2013 Activity: pelvic rest Diet: routine Medications: PNV, Ibuprofen and Percocet Condition: stable Instructions: refer to practice specific booklet Discharge to: home Follow-up Information   Follow up with Bovard-Stuckert, Evan Mackie, MD. Schedule an appointment as soon as possible for a visit in 6 weeks. (for postpartum check)    Specialty:  Obstetrics and Gynecology   Contact information:   [address] [place] [id number] [phone]       Newborn Data: Live born female  Birth Weight: 4 lb 14.7 oz (2230 g) APGAR: 9, 9  Home with mother.  Eann Cleland Bovard-Stuckert 05/11/2013, 8:01 AM

## 2013-05-12 NOTE — Progress Notes (Signed)
Post discharge ur review completed. 

## 2013-06-03 ENCOUNTER — Encounter (HOSPITAL_COMMUNITY): Payer: Self-pay | Admitting: Pharmacy Technician

## 2013-06-09 ENCOUNTER — Encounter: Payer: Self-pay | Admitting: Family Medicine

## 2013-06-09 ENCOUNTER — Ambulatory Visit (INDEPENDENT_AMBULATORY_CARE_PROVIDER_SITE_OTHER): Payer: Medicaid Other | Admitting: Family Medicine

## 2013-06-09 VITALS — BP 131/84 | HR 87 | Temp 98.7°F | Wt 177.0 lb

## 2013-06-09 DIAGNOSIS — L42 Pityriasis rosea: Secondary | ICD-10-CM

## 2013-06-09 DIAGNOSIS — R21 Rash and other nonspecific skin eruption: Secondary | ICD-10-CM | POA: Insufficient documentation

## 2013-06-09 DIAGNOSIS — M542 Cervicalgia: Secondary | ICD-10-CM

## 2013-06-09 MED ORDER — TERBINAFINE HCL 250 MG PO TABS: 250.0000 mg | ORAL_TABLET | Freq: Every day | ORAL | Status: DC

## 2013-06-09 MED ORDER — TERBINAFINE HCL 1 % EX CREA: 1.0000 "application " | TOPICAL_CREAM | Freq: Two times a day (BID) | CUTANEOUS | Status: DC

## 2013-06-09 NOTE — Patient Instructions (Signed)
Good to see you today!  Thanks for coming in.  Use the terbenafine pill once daily for 14 days and the cream twice daily until rash in gone  Consider a chiropracter for the neck popping.  Call me if you want an xray or if you have shooting pain or weakness

## 2013-06-09 NOTE — Assessment & Plan Note (Signed)
Most consistent with tina corporis.  Treat with terbenafine

## 2013-06-09 NOTE — Progress Notes (Signed)
   Subjective:    Patient ID: [name] [name], female    DOB: [birth year], 28 y.o.   MRN: [id number]  HPI  NECK POPPING Has had for months to years.  Has senstation of slipping/popping bilaterally depending on head movements.  No pain or radiating symptoms.  No recent injury.  Worsened during pregnancy and has not improved 4 weeks post partum.  No focally tender area or masses.    RASH  Location: all over back Onset: several months ago  Course: slight worsening Self-treated with: atarax             Improvement with treatment: non  History Pruritis: yes  Tenderness: no  New medications/antibiotics: no  Tick/insect/pet exposure: no  Recent travel: no  New detergent, new clothing, or other topical exposure: no   Red Flags Feeling ill: no  Fever: no  Mouth lesions: no  Facial/tongue swelling/difficulty breathing:  no  Diabetic or immunocompromised: no  Was pregnant 1 month ago    Chief Complaint noted Review of Symptoms - see HPI PMH - Smoking status noted.      Review of Systems     Objective:   Physical Exam Neck - FROM with feeling of popping or clunking without audibe sounds.  No tenderness.  No masses Strength - 5/5 UE Sensation normal Back skin - scaly annular coallescing lesions with central clearing Mouth - no lesions, mucous membranes are moist, no decaying teeth         Assessment & Plan:

## 2013-06-09 NOTE — Assessment & Plan Note (Signed)
Persistent despite cessation of pregnancy.  No signs of cervical impingement or arthritis or significant subluxation.  Suggested considering follow up with chiropracter for xray and alignment adjustment exercises

## 2013-06-12 ENCOUNTER — Encounter (HOSPITAL_COMMUNITY): Payer: Self-pay | Admitting: Obstetrics and Gynecology

## 2013-06-12 DIAGNOSIS — Z302 Encounter for sterilization: Secondary | ICD-10-CM

## 2013-06-12 HISTORY — DX: Encounter for sterilization: Z30.2

## 2013-06-12 NOTE — H&P (Signed)
[name] [name] is an 28 y.o. female G2P.1102 s/p SVD desires sterility.  30day tubal papers not signed and mature at delivery.  D//W pt r/b/a of BTL - pt wishes to proceed.    Pertinent Gynecological History: OB History: G2, P1102  G1 36wk PreE,5#11 G2 05/10/2013 SUA, SVD 4#11.7 No STD No Abn pap   Menstrual History: No LMP recorded.    Past Medical History  Diagnosis Date  . Asthma   . Pregnant state, incidental   . Breast abscess   . Family history of anesthesia complication     Grandmother has difficulty waskin up.  . Depression     HX of - not on med n, doing well.  . Pneumonia     years ago has had a few times.  . Eczema   . Admission for sterilization 06/12/2013    Past Surgical History  Procedure Laterality Date  . Incise and drain abcess      left breast  . Incision and drainage abscess Left 02/17/2012    Procedure:  DRAINAGEof recurrent breast abscess;  Surgeon: Shelly Rubenstein, MD;  Location: [facility] OR;  Service: General;  Laterality: Left;    Family History  Problem Relation Age of Onset  . Healthy Mother   . Healthy Father   . Stroke Maternal Grandmother   . Stroke Paternal Grandfather   anemia, HTN, COPD, asthma, breast cancer  Social History:  reports that she quit smoking about 2 years ago. She has never used smokeless tobacco. She reports that she does not drink alcohol or use illicit drugs.  Allergies:  Allergies  Allergen Reactions  . Doxycycline Photosensitivity    No prescriptions prior to admission  Meds Dulera, Flonase, Atarax, Claritin, Singulair, Albuterl  Review of Systems  Constitutional: Negative.   HENT: Negative.   Eyes: Negative.   Respiratory: Negative.   Cardiovascular: Negative.   Gastrointestinal: Negative.   Genitourinary: Negative.   Musculoskeletal: Negative.   Skin: Negative.   Neurological: Negative.   Psychiatric/Behavioral: Negative.     not currently breastfeeding. Physical Exam  Constitutional: She is  oriented to person, place, and time. She appears well-developed and well-nourished.  HENT:  Head: Normocephalic and atraumatic.  Cardiovascular: Normal rate and regular rhythm.   Respiratory: Effort normal and breath sounds normal. No respiratory distress. She has no wheezes.  GI: Soft. Bowel sounds are normal. She exhibits no distension. There is no tenderness.  Musculoskeletal: Normal range of motion.  Neurological: She is alert and oriented to person, place, and time.  Skin: Skin is warm and dry.  Psychiatric: She has a normal mood and affect. Her behavior is normal.    No results found for this or any previous visit (from the past 24 hour(s)).  No results found.  Assessment/Plan: 27yo [id number] s/p SVD 05/2013 for BTL by fukguration D/w pt r/b/a - wishes to proceed  [name] [name], [name] [name] 06/12/2013, 8:59 PM

## 2013-06-13 ENCOUNTER — Encounter (HOSPITAL_COMMUNITY): Payer: Self-pay | Admitting: *Deleted

## 2013-06-13 ENCOUNTER — Ambulatory Visit (HOSPITAL_COMMUNITY): Payer: Medicaid Other | Admitting: Anesthesiology

## 2013-06-13 ENCOUNTER — Ambulatory Visit (HOSPITAL_COMMUNITY)
Admission: RE | Admit: 2013-06-13 | Discharge: 2013-06-13 | Disposition: A | Discharge status: Home / Self Care | Payer: Medicaid Other | Source: Ambulatory Visit | Attending: Obstetrics and Gynecology | Admitting: Obstetrics and Gynecology

## 2013-06-13 ENCOUNTER — Encounter (HOSPITAL_COMMUNITY): Payer: Medicaid Other | Admitting: Anesthesiology

## 2013-06-13 ENCOUNTER — Encounter (HOSPITAL_COMMUNITY)
Admission: RE | Disposition: A | Discharge status: Home / Self Care | Payer: Self-pay | Source: Ambulatory Visit | Attending: Obstetrics and Gynecology

## 2013-06-13 DIAGNOSIS — Z87891 Personal history of nicotine dependence: Secondary | ICD-10-CM | POA: Insufficient documentation

## 2013-06-13 DIAGNOSIS — Z9851 Tubal ligation status: Secondary | ICD-10-CM

## 2013-06-13 DIAGNOSIS — J45909 Unspecified asthma, uncomplicated: Secondary | ICD-10-CM | POA: Insufficient documentation

## 2013-06-13 DIAGNOSIS — Z302 Encounter for sterilization: Secondary | ICD-10-CM | POA: Insufficient documentation

## 2013-06-13 HISTORY — PX: LAPAROSCOPIC TUBAL LIGATION: SHX1937

## 2013-06-13 HISTORY — DX: Tubal ligation status: Z98.51

## 2013-06-13 HISTORY — DX: Encounter for sterilization: Z30.2

## 2013-06-13 LAB — CBC
HEMATOCRIT: 43.1 % (ref 36.0–46.0)
Hemoglobin: 14.6 g/dL (ref 12.0–15.0)
MCH: 32.2 pg (ref 26.0–34.0)
MCHC: 33.9 g/dL (ref 30.0–36.0)
MCV: 94.9 fL (ref 78.0–100.0)
PLATELETS: 415 10*3/uL — AB (ref 150–400)
RBC: 4.54 MIL/uL (ref 3.87–5.11)
RDW: 13.6 % (ref 11.5–15.5)
WBC: 10.1 10*3/uL (ref 4.0–10.5)

## 2013-06-13 LAB — PREGNANCY, URINE: Preg Test, Ur: NEGATIVE

## 2013-06-13 SURGERY — LIGATION, FALLOPIAN TUBE, LAPAROSCOPIC
Anesthesia: General | Site: Abdomen | Laterality: Bilateral

## 2013-06-13 MED ORDER — MIDAZOLAM HCL 2 MG/2ML IJ SOLN
INTRAMUSCULAR | Status: AC
  Filled 2013-06-13: qty 2

## 2013-06-13 MED ORDER — MIDAZOLAM HCL 2 MG/2ML IJ SOLN
INTRAMUSCULAR | Status: DC | PRN
  Administered 2013-06-13: 2 mg via INTRAVENOUS

## 2013-06-13 MED ORDER — ONDANSETRON HCL 4 MG/2ML IJ SOLN
INTRAMUSCULAR | Status: DC | PRN
  Administered 2013-06-13: 4 mg via INTRAVENOUS

## 2013-06-13 MED ORDER — HYDROMORPHONE HCL PF 1 MG/ML IJ SOLN
INTRAMUSCULAR | Status: AC
  Filled 2013-06-13: qty 1

## 2013-06-13 MED ORDER — PROPOFOL 10 MG/ML IV EMUL
INTRAVENOUS | Status: AC
  Filled 2013-06-13: qty 20

## 2013-06-13 MED ORDER — FENTANYL CITRATE 0.05 MG/ML IJ SOLN
INTRAMUSCULAR | Status: AC
  Filled 2013-06-13: qty 5

## 2013-06-13 MED ORDER — ROCURONIUM BROMIDE 100 MG/10ML IV SOLN
INTRAVENOUS | Status: DC | PRN
  Administered 2013-06-13: 35 mg via INTRAVENOUS

## 2013-06-13 MED ORDER — BUPIVACAINE HCL (PF) 0.25 % IJ SOLN
INTRAMUSCULAR | Status: AC
  Filled 2013-06-13: qty 30

## 2013-06-13 MED ORDER — LACTATED RINGERS IV SOLN
INTRAVENOUS | Status: DC
  Administered 2013-06-13: 10:00:00 via INTRAVENOUS

## 2013-06-13 MED ORDER — PROPOFOL 10 MG/ML IV BOLUS
INTRAVENOUS | Status: DC | PRN
  Administered 2013-06-13: 20 mg via INTRAVENOUS
  Administered 2013-06-13: 180 mg via INTRAVENOUS

## 2013-06-13 MED ORDER — IBUPROFEN 800 MG PO TABS: 800.0000 mg | ORAL_TABLET | Freq: Three times a day (TID) | ORAL | Status: DC | PRN

## 2013-06-13 MED ORDER — GLYCOPYRROLATE 0.2 MG/ML IJ SOLN
INTRAMUSCULAR | Status: AC
  Filled 2013-06-13: qty 3

## 2013-06-13 MED ORDER — 0.9 % SODIUM CHLORIDE (POUR BTL) OPTIME
TOPICAL | Status: DC | PRN
  Administered 2013-06-13: 1000 mL

## 2013-06-13 MED ORDER — DEXAMETHASONE SODIUM PHOSPHATE 10 MG/ML IJ SOLN
INTRAMUSCULAR | Status: DC | PRN
  Administered 2013-06-13: 10 mg via INTRAVENOUS

## 2013-06-13 MED ORDER — ROCURONIUM BROMIDE 100 MG/10ML IV SOLN
INTRAVENOUS | Status: AC
  Filled 2013-06-13: qty 1

## 2013-06-13 MED ORDER — FENTANYL CITRATE 0.05 MG/ML IJ SOLN: 25.0000 ug | INTRAMUSCULAR | Status: DC | PRN

## 2013-06-13 MED ORDER — KETOROLAC TROMETHAMINE 30 MG/ML IJ SOLN
INTRAMUSCULAR | Status: DC | PRN
  Administered 2013-06-13: 30 mg via INTRAVENOUS

## 2013-06-13 MED ORDER — FENTANYL CITRATE 0.05 MG/ML IJ SOLN
INTRAMUSCULAR | Status: DC | PRN
  Administered 2013-06-13 (×2): 100 ug via INTRAVENOUS
  Administered 2013-06-13: 50 ug via INTRAVENOUS

## 2013-06-13 MED ORDER — GLYCOPYRROLATE 0.2 MG/ML IJ SOLN
INTRAMUSCULAR | Status: DC | PRN
  Administered 2013-06-13: .5 mg via INTRAVENOUS

## 2013-06-13 MED ORDER — NEOSTIGMINE METHYLSULFATE 10 MG/10ML IV SOLN
INTRAVENOUS | Status: AC
  Filled 2013-06-13: qty 1

## 2013-06-13 MED ORDER — LACTATED RINGERS IV SOLN
INTRAVENOUS | Status: DC
  Administered 2013-06-13 (×2): via INTRAVENOUS

## 2013-06-13 MED ORDER — NEOSTIGMINE METHYLSULFATE 10 MG/10ML IV SOLN
INTRAVENOUS | Status: DC | PRN
  Administered 2013-06-13: 3 mg via INTRAVENOUS

## 2013-06-13 MED ORDER — DEXAMETHASONE SODIUM PHOSPHATE 10 MG/ML IJ SOLN
INTRAMUSCULAR | Status: AC
  Filled 2013-06-13: qty 1

## 2013-06-13 MED ORDER — ONDANSETRON HCL 4 MG/2ML IJ SOLN
INTRAMUSCULAR | Status: AC
  Filled 2013-06-13: qty 2

## 2013-06-13 MED ORDER — HYDROMORPHONE HCL PF 1 MG/ML IJ SOLN
INTRAMUSCULAR | Status: DC | PRN
  Administered 2013-06-13: 1 mg via INTRAVENOUS

## 2013-06-13 MED ORDER — LIDOCAINE HCL (CARDIAC) 20 MG/ML IV SOLN
INTRAVENOUS | Status: AC
  Filled 2013-06-13: qty 5

## 2013-06-13 MED ORDER — KETOROLAC TROMETHAMINE 30 MG/ML IJ SOLN
INTRAMUSCULAR | Status: AC
  Filled 2013-06-13: qty 1

## 2013-06-13 MED ORDER — OXYCODONE-ACETAMINOPHEN 5-325 MG PO TABS: 1.0000 | ORAL_TABLET | Freq: Four times a day (QID) | ORAL | Status: DC | PRN

## 2013-06-13 MED ORDER — LIDOCAINE HCL (CARDIAC) 20 MG/ML IV SOLN
INTRAVENOUS | Status: DC | PRN
  Administered 2013-06-13: 80 mg via INTRAVENOUS

## 2013-06-13 MED ORDER — BUPIVACAINE HCL (PF) 0.25 % IJ SOLN
INTRAMUSCULAR | Status: DC | PRN
  Administered 2013-06-13: 6 mL

## 2013-06-13 SURGICAL SUPPLY — 29 items
ADH SKN CLS APL DERMABOND .7 (GAUZE/BANDAGES/DRESSINGS) ×1
BAG SPEC RTRVL LRG 6X4 10 (ENDOMECHANICALS)
CATH ROBINSON RED A/P 16FR (CATHETERS) ×3 IMPLANT
CHLORAPREP W/TINT 26ML (MISCELLANEOUS) ×3 IMPLANT
CLOTH BEACON ORANGE TIMEOUT ST (SAFETY) ×3 IMPLANT
DECANTER SPIKE VIAL GLASS SM (MISCELLANEOUS) ×2 IMPLANT
DERMABOND ADVANCED (GAUZE/BANDAGES/DRESSINGS) ×2
DERMABOND ADVANCED .7 DNX12 (GAUZE/BANDAGES/DRESSINGS) ×1 IMPLANT
DRESSING OPSITE X SMALL 2X3 (GAUZE/BANDAGES/DRESSINGS) ×2 IMPLANT
DRSG COVADERM PLUS 2X2 (GAUZE/BANDAGES/DRESSINGS) ×6 IMPLANT
DRSG OPSITE POSTOP 3X4 (GAUZE/BANDAGES/DRESSINGS) IMPLANT
GLOVE BIO SURGEON STRL SZ 6.5 (GLOVE) ×2 IMPLANT
GLOVE BIO SURGEON STRL SZ7 (GLOVE) ×3 IMPLANT
GLOVE BIO SURGEONS STRL SZ 6.5 (GLOVE) ×1
GOWN STRL REUS W/TWL LRG LVL3 (GOWN DISPOSABLE) ×6 IMPLANT
NEEDLE INSUFFLATION 120MM (ENDOMECHANICALS) ×3 IMPLANT
PACK LAPAROSCOPY BASIN (CUSTOM PROCEDURE TRAY) ×3 IMPLANT
POUCH SPECIMEN RETRIEVAL 10MM (ENDOMECHANICALS) IMPLANT
PROTECTOR NERVE ULNAR (MISCELLANEOUS) ×4 IMPLANT
SHEARS HARMONIC ACE PLUS 36CM (ENDOMECHANICALS) ×1 IMPLANT
SUT VIC AB 3-0 CTX 36 (SUTURE) IMPLANT
SUT VIC AB 3-0 PS2 18 (SUTURE) ×3
SUT VIC AB 3-0 PS2 18XBRD (SUTURE) ×1 IMPLANT
SUT VICRYL 0 UR6 27IN ABS (SUTURE) IMPLANT
TOWEL OR 17X24 6PK STRL BLUE (TOWEL DISPOSABLE) ×6 IMPLANT
TROCAR XCEL NON-BLD 11X100MML (ENDOMECHANICALS) ×3 IMPLANT
TROCAR XCEL NON-BLD 5MMX100MML (ENDOMECHANICALS) ×4 IMPLANT
WARMER LAPAROSCOPE (MISCELLANEOUS) ×3 IMPLANT
WATER STERILE IRR 1000ML POUR (IV SOLUTION) ×3 IMPLANT

## 2013-06-13 NOTE — Transfer of Care (Signed)
Immediate Anesthesia Transfer of Care Note  Patient: [name] [name]  Procedure(s) Performed: Procedure(s): LAPAROSCOPIC TUBAL LIGATION (Bilateral)  Patient Location: PACU  Anesthesia Type:General  Level of Consciousness: awake, alert  and oriented  Airway & Oxygen Therapy: Patient Spontanous Breathing and Patient connected to nasal cannula oxygen  Post-op Assessment: Report given to PACU RN, Post -op Vital signs reviewed and stable and Patient moving all extremities  Post vital signs: Reviewed and stable  Complications: No apparent anesthesia complications

## 2013-06-13 NOTE — Anesthesia Preprocedure Evaluation (Signed)
Anesthesia Evaluation  Patient identified by MRN, date of birth, ID band Patient awake    Reviewed: Allergy & Precautions, H&P , Patient's Chart, lab work & pertinent test results, reviewed documented beta blocker date and time   Airway Mallampati: II TM Distance: >3 FB Neck ROM: full    Dental no notable dental hx.    Pulmonary asthma , former smoker,  breath sounds clear to auscultation  Pulmonary exam normal       Cardiovascular Rhythm:regular Rate:Normal     Neuro/Psych    GI/Hepatic   Endo/Other    Renal/GU      Musculoskeletal   Abdominal   Peds  Hematology   Anesthesia Other Findings   Reproductive/Obstetrics                           Anesthesia Physical Anesthesia Plan  ASA: II  Anesthesia Plan: General   Post-op Pain Management:    Induction: Intravenous  Airway Management Planned: Oral ETT  Additional Equipment:   Intra-op Plan:   Post-operative Plan: Extubation in OR  Informed Consent: I have reviewed the patients History and Physical, chart, labs and discussed the procedure including the risks, benefits and alternatives for the proposed anesthesia with the patient or authorized representative who has indicated his/her understanding and acceptance.   Dental Advisory Given and Dental advisory given  Plan Discussed with: CRNA and Surgeon  Anesthesia Plan Comments: (  Discussed general anesthesia, including possible nausea, instrumentation of airway, sore throat,pulmonary aspiration, etc. I asked if the were any outstanding questions, or  concerns before we proceeded. )        Anesthesia Quick Evaluation  

## 2013-06-13 NOTE — Brief Op Note (Signed)
06/13/2013  11:46 AM  PATIENT:  [name] [name]  28 y.o. female  PRE-OPERATIVE DIAGNOSIS:  Sterilization, [id number]  POST-OPERATIVE DIAGNOSIS:  Sterilization  PROCEDURE:  Procedure(s): LAPAROSCOPIC TUBAL LIGATION (Bilateral)  SURGEON:  Surgeon(s) and Role:    * Abbygail [place]hoite Bovard-Stuckert, MD - Primary  ANESTHESIA:   general  EBL:  Total I/O In: 1300 [I.V.:1300] Out: 115 [Urine:100; Blood:15]  BLOOD ADMINISTERED:none  DRAINS: none   LOCAL MEDICATIONS USED:  MARCAINE     SPECIMEN:  No Specimen  DISPOSITION OF SPECIMEN:  N/A  COUNTS:  YES  TOURNIQUET:  * No tourniquets in log *  DICTATION: .Other Dictation: Dictation Number [id number]  PLAN OF CARE: Discharge to home after PACU  PATIENT DISPOSITION:  PACU - hemodynamically stable.   Delay start of Pharmacological VTE agent (>24hrs) due to surgical blood loss or risk of bleeding: not applicable

## 2013-06-13 NOTE — Discharge Instructions (Addendum)
°  DO NOT TAKE IBUPROFEN (ADVIL, ALEVE OR MOTRIN) TILL AFTER 5:15 PM!  Laparoscopic Tubal Ligation  Care After   Refer to this sheet in the next few weeks. These instructions provide you with information on caring for yourself after your procedure. Your caregiver may also give you more specific instructions. Your treatment has been planned according to current medical practices, but problems sometimes occur. Call your caregiver if you have any problems or questions after your procedure.   HOME CARE INSTRUCTIONS  Rest the remainder of the day.  Only take over-the-counter or prescription medicines for pain, discomfort, or fever as directed by your caregiver. Do not take aspirin. It can cause bleeding.  Gradually resume daily activities, diet, rest, driving, and work.  Avoid sexual intercourse for 2 weeks or as directed.  Do not use tampons or douche.  Do not drive while taking pain medicine.  Do not lift anything over 5 pounds for 2 weeks or as directed.  Only take showers, not baths, until you are seen by your caregiver.  Change bandages (dressings) as directed.  Take your temperature twice a day and record it.  Try to have help for the first 7 to 10 days for your household needs.  Return to your caregiver to get your stitches (sutures) removed and for follow-up visits as directed.  SEEK MEDICAL CARE IF:  You have redness, swelling, or increasing pain in a wound.  You have drainage from a wound lasting longer than 1 day.  Your pain is getting worse.  You have a rash.  You become dizzy or lightheaded.  You have a reaction to your medicine.  You need stronger medicine or a change in your pain medicine.  You notice a bad smell coming from a wound or dressing.  Your wound breaks open after the sutures have been removed.  You are constipated.  SEEK IMMEDIATE MEDICAL CARE IF:  You faint.  You have a fever.  You have increasing abdominal pain.  You have severe pain in your shoulders.   You have bleeding or drainage from the suture sites or vagina following surgery.  You have shortness of breath or difficulty breathing.  You have chest or leg pain.  You have persistent nausea, vomiting, or diarrhea.  MAKE SURE YOU:  Understand these instructions.  Watch your condition.  Get help right away if you are not doing well or get worse.

## 2013-06-13 NOTE — Interval H&P Note (Signed)
History and Physical Interval Note:  06/13/2013 10:04 AM  [name] [name]  has presented today for surgery, with the diagnosis of Sterilization, [id number]  The various methods of treatment have been discussed with the patient and family. After consideration of risks, benefits and other options for treatment, the patient has consented to  Procedure(s): LAPAROSCOPIC TUBAL LIGATION (Bilateral) as a surgical intervention .  The patient's history has been reviewed, patient examined, no change in status, stable for surgery.  I have reviewed the patient's chart and labs.  Questions were answered to the patient's satisfaction.     Bovard-Stuckert, Laverna Dossett

## 2013-06-13 NOTE — Anesthesia Postprocedure Evaluation (Signed)
  Anesthesia Post-op Note  Patient: [name] [name]  Procedure(s) Performed: Procedure(s): LAPAROSCOPIC TUBAL LIGATION (Bilateral) Patient is awake and responsive. Pain and nausea are reasonably well controlled. Vital signs are stable and clinically acceptable. Oxygen saturation is clinically acceptable. There are no apparent anesthetic complications at this time. Patient is ready for discharge.

## 2013-06-14 NOTE — Op Note (Signed)
NAMVerdell Carmine:  Ritchie, Maliea         ACCOUNT NO.:  [id number]  MEDICAL RECORD NO.:  [id number]  LOCATION:  [facility]                          FACILITY:  [facility]  PHYSICIAN:  Sherron MondayJody Bovard, MD        DATE OF BIRTH:  [birth year]  DATE OF PROCEDURE:  06/13/2013 DATE OF DISCHARGE:  06/13/2013                              OPERATIVE REPORT   PREOPERATIVE DIAGNOSIS:  Desires sterilization, signed 30 day papers Less than 30 days before admission for delivery.  POSTOPERATIVE DIAGNOSIS:  Desires sterilization, signed 30 day papers Less than 30 days before admission for delivery.  PROCEDURE:  Bilateral tubal ligation by fulguration.  SURGEON:  Sherron MondayJody Bovard, MD.  ANESTHESIA:  General.  IV FLUIDS:  1300 mL.  EBL:  15 mL.  URINE OUTPUT:  100 mL by I and O cath prior to the procedure.  COMPLICATIONS:  None.  PATHOLOGY:  None.  DESCRIPTION OF PROCEDURE:  After informed consent was reviewed with the patient, she was transferred back to the OR in stable condition, placed on table in supine position.  General anesthesia was induced and found to be adequate.  She was then placed in the Y[place]in stirrups, prepped and draped in the normal sterile fashion.  Using an open-sided speculum and a single-tooth tenaculum, a Hulka manipulator was placed in the uterus.  Gloves were changed.  Attention was turned to the abdominal portion of the case and approximately 10 mm vertical infraumbilical incision was made using a Veress needle.  The pneumoperitoneum was obtained after ensuring the hanging drop test was passed.  The trocar was placed under direct visualization.  A brief pelvic survey revealed normal postpartum anatomy.  Normal uterus, normal bilateral tubes and ovaries.  The tubes were identified out to the fimbriated end with a triple burn technique.  Bilaterally, the tubes were fulgurated.  Gas was evacuated from the abdomen.  The incision was closed with the deep stitch of 0 Vicryl and subcu stitch with  3-0 Vicryl.  Skin was closed with Dermabond.  The tenaculum and the manipulator was removed from the uterus.  The patient was returned to the supine position, awakened in stable condition. Sponge, lap, and needle counts were correct x2.     Sherron MondayJody Bovard, MD     JB/MEDQ  D:  06/13/2013  T:  06/13/2013  Job:  [id number]

## 2013-06-16 ENCOUNTER — Telehealth: Payer: Self-pay | Admitting: Family Medicine

## 2013-06-16 ENCOUNTER — Encounter (HOSPITAL_COMMUNITY): Payer: Self-pay | Admitting: Obstetrics and Gynecology

## 2013-06-16 DIAGNOSIS — N61 Mastitis without abscess: Secondary | ICD-10-CM

## 2013-06-16 NOTE — Telephone Encounter (Signed)
Will forward to MD.  Looks like she goes for recurrent breast abcesses. Mordechai Matuszak,CMA

## 2013-06-16 NOTE — Telephone Encounter (Signed)
I put in referral. Thanks!

## 2013-06-16 NOTE — Telephone Encounter (Signed)
Need a referral to [facility] It is the same issue

## 2013-06-17 ENCOUNTER — Encounter (INDEPENDENT_AMBULATORY_CARE_PROVIDER_SITE_OTHER): Payer: Self-pay | Admitting: General Surgery

## 2013-06-17 ENCOUNTER — Ambulatory Visit (INDEPENDENT_AMBULATORY_CARE_PROVIDER_SITE_OTHER): Payer: Medicaid Other | Admitting: General Surgery

## 2013-06-17 VITALS — BP 124/76 | HR 77 | Temp 97.0°F | Ht 60.0 in | Wt 176.0 lb

## 2013-06-17 DIAGNOSIS — N61 Mastitis without abscess: Secondary | ICD-10-CM

## 2013-06-17 NOTE — Telephone Encounter (Signed)
NPI given to [facility]

## 2013-06-17 NOTE — Progress Notes (Signed)
Subjective:   recurrent left breast abscess  Patient ID: [name] [name], female   DOB: August 04, 1985, 28 y.o.   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 This has been present since at least 2013. She has undergone 2 previous procedures with incision and drainage and debridement of periareolar abscess at approximately the 10:00 position of the left breast. I read both operative notes and there was no complete duct excision done that either procedure. She will have intermittent swelling and redness in that spontaneous drainage from a periareolar opening. This will occur about once a month. She does not have nipple drainage. She is a previous smoker but not a current smoker. The area has never completely healed over the last 2-1/2 years.  Past Medical History  Diagnosis Date  . Asthma   . Pregnant state, incidental   . Breast abscess   . Family history of anesthesia complication     Grandmother has difficulty waskin up.  . Depression     HX of - not on med n, doing well.  . Eczema   . Admission for sterilization 06/12/2013  . SVD (spontaneous vaginal delivery)     x 2  . Pneumonia     HX only -years ago has had a few times.  . S/P tubal ligation 06/13/2013   Past Surgical History  Procedure Laterality Date  . Incise and drain abcess      left breast x 2  . Incision and drainage abscess Left 02/17/2012    Procedure:  DRAINAGEof recurrent breast abscess;  Surgeon: Shelly Rubensteinouglas A Blackman, MD;  Location: [facility] OR;  Service: General;  Laterality: Left;  . Laparoscopic tubal ligation Bilateral 06/13/2013    Procedure: LAPAROSCOPIC TUBAL LIGATION;  Surgeon: Sherian ReinJody Bovard-Stuckert, MD;  Location: [facility];  Service: Gynecology;  Laterality: Bilateral;   Current Outpatient Prescriptions  Medication Sig Dispense Refill  . albuterol (PROVENTIL HFA;VENTOLIN HFA) 108 (90 BASE) MCG/ACT inhaler Inhale 2 puffs into the lungs every 4  (four) hours as needed for wheezing or shortness of breath.      . hydrOXYzine (ATARAX/VISTARIL) 10 MG tablet Take 1 tablet (10 mg total) by mouth 3 (three) times daily as needed for itching.  30 tablet  0  . ibuprofen (MOTRIN IB) 800 MG tablet Take 1 tablet (800 mg total) by mouth every 8 (eight) hours as needed.  45 tablet  1  . loratadine (CLARITIN) 10 MG tablet Take 1 tablet (10 mg total) by mouth daily.  90 tablet  1  . mometasone-formoterol (DULERA) 200-5 MCG/ACT AERO Inhale 2 puffs into the lungs 2 (two) times daily.  1 Inhaler  2  . montelukast (SINGULAIR) 10 MG tablet Take 1 tablet (10 mg total) by mouth at bedtime.  30 tablet  3  . oxyCODONE-acetaminophen (ROXICET) 5-325 MG per tablet Take 1-2 tablets by mouth every 6 (six) hours as needed for severe pain.  20 tablet  0  . terbinafine (LAMISIL) 1 % cream Apply 1 application topically 2 (two) times daily.  30 g  3  . terbinafine (LAMISIL) 250 MG tablet Take 1 tablet (250 mg total) by mouth daily.  14 tablet  0   No current facility-administered medications for this visit.   Allergies  Allergen Reactions  . Doxycycline Photosensitivity   History  Substance Use Topics  . Smoking status: Former Smoker -- 0.50 packs/day for 5 years    Types: Cigarettes  Quit date: 06/22/2010  . Smokeless tobacco: Never Used  . Alcohol Use: No     Review of Systems  Constitutional: Negative.   HENT: Negative.   Respiratory: Positive for shortness of breath and wheezing.   Cardiovascular: Negative.        Objective:   Physical Exam BP 124/76  Pulse 77  Temp(Src) 97 F (36.1 C)  Ht 5' (1.524 m)  Wt 176 lb (79.833 kg)  BMI 34.37 kg/m2 General: Moderately obese young Caucasian female no distress Skin: The rash infection HEENT: Sclera nonicteric. No masses. Lungs: Mild expiratory wheezes without increased work of breathing Cardiac: Regular rate and rhythm without murmurs Breasts: There is a fistula opening adjacent to the left areola  medially at about the 10:00 position. A slight amount of purulent drainage with pressure. No mass or redness. I cannot express any pus from the nipple. No masses or other abnormalities.    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 I discussed the diagnosis with the patient. I think this will require a duct excision from the nipple out to the fistula opening. I discussed the surgery in detail including the type of incision in the fat the wound would be left open. I discussed that a portion of the nipple itself would be removed and there would be some degree of cosmetic defect at the nipple/areola. We discussed that recurrent abscess is a definite possibility. All her questions were answered and she strongly would want to proceed with surgery to try to get this resolved.     Plan:     Excision of chronic periareolar abscess/nipple fistula under general anesthesia as an outpatient.

## 2013-07-07 ENCOUNTER — Other Ambulatory Visit (INDEPENDENT_AMBULATORY_CARE_PROVIDER_SITE_OTHER): Payer: Self-pay | Admitting: General Surgery

## 2013-07-07 ENCOUNTER — Other Ambulatory Visit (INDEPENDENT_AMBULATORY_CARE_PROVIDER_SITE_OTHER): Payer: Self-pay

## 2013-07-07 DIAGNOSIS — N6459 Other signs and symptoms in breast: Secondary | ICD-10-CM

## 2013-07-07 DIAGNOSIS — L723 Sebaceous cyst: Secondary | ICD-10-CM

## 2013-07-07 MED ORDER — OXYCODONE-ACETAMINOPHEN 5-325 MG PO TABS: 1.0000 | ORAL_TABLET | ORAL | Status: DC | PRN

## 2013-07-08 ENCOUNTER — Other Ambulatory Visit: Payer: Self-pay | Admitting: Family Medicine

## 2013-07-14 ENCOUNTER — Telehealth (INDEPENDENT_AMBULATORY_CARE_PROVIDER_SITE_OTHER): Payer: Self-pay

## 2013-07-14 ENCOUNTER — Other Ambulatory Visit (INDEPENDENT_AMBULATORY_CARE_PROVIDER_SITE_OTHER): Payer: Self-pay | Admitting: General Surgery

## 2013-07-14 MED ORDER — OXYCODONE-ACETAMINOPHEN 5-325 MG PO TABS: 1.0000 | ORAL_TABLET | ORAL | Status: DC | PRN

## 2013-07-14 MED ORDER — OXYCODONE-ACETAMINOPHEN 5-325 MG PO TABS: 1.0000 | ORAL_TABLET | Freq: Four times a day (QID) | ORAL | Status: DC | PRN

## 2013-07-14 NOTE — Telephone Encounter (Signed)
Pt s/p excision of chronic periareolar abscess/nipple fistula by Dr Johna SheriffHoxworth. Pt is calling today to see if she can get a refill on her Percocet 5/325mg . Pt rates her pain a 6 at this time. Pts last refill was on 07/07/13. Informed pt that I would send Dr Johna SheriffHoxworth a message requesting refill and then we would contact her back as soon as we received a response.

## 2013-07-14 NOTE — Telephone Encounter (Signed)
LMOM. Pt has a Rx for Percocet 5/325mg  1-2tabs q 6hours as needed for pain #20  to pick up at the front desk

## 2013-07-14 NOTE — Telephone Encounter (Signed)
One refill OK

## 2013-07-14 NOTE — Telephone Encounter (Signed)
Informed pt of message below. Pt verbalized understanding  

## 2013-07-21 ENCOUNTER — Encounter (INDEPENDENT_AMBULATORY_CARE_PROVIDER_SITE_OTHER): Payer: Self-pay | Admitting: General Surgery

## 2013-07-21 ENCOUNTER — Ambulatory Visit (INDEPENDENT_AMBULATORY_CARE_PROVIDER_SITE_OTHER): Payer: Medicaid Other | Admitting: General Surgery

## 2013-07-21 VITALS — BP 108/70 | HR 60 | Temp 97.4°F | Resp 12 | Ht 60.0 in | Wt 174.6 lb

## 2013-07-21 DIAGNOSIS — Z09 Encounter for follow-up examination after completed treatment for conditions other than malignant neoplasm: Secondary | ICD-10-CM

## 2013-07-22 NOTE — Progress Notes (Signed)
Chief complaint: Followup excision nipple/areolar fistula/chronic abscess  History: Patient returns about 3 weeks following above procedure for chronic fistula and abscess of the left breast. She generally has been doing well but states she feels like the abscess is coming back. She states she gets some vague discomfort well back behind her breast along the chest wall when this occurs.  Exam: BP 108/70  Pulse 60  Temp(Src) 97.4 F (36.3 C)  Resp 12  Ht 5' (1.524 m)  Wt 174 lb 9.6 oz (79.198 kg)  BMI 34.10 kg/m2 The wound at the left nipple/areola it is nearly completely healed with just a tiny bit of clean granulation tissue and no induration or drainage or erythema.  Assessment and plan: Appears to be healing well after the above procedure. I told her at this point I do not see any signs of recurrent abscess or infection. Possibly just postop discomfort. I asked her to call if things got any worse otherwise I will see her back in one month and follow until the wound is completely healed.

## 2013-08-27 ENCOUNTER — Encounter (INDEPENDENT_AMBULATORY_CARE_PROVIDER_SITE_OTHER): Payer: Medicaid Other | Admitting: General Surgery

## 2013-09-04 ENCOUNTER — Encounter (INDEPENDENT_AMBULATORY_CARE_PROVIDER_SITE_OTHER): Payer: Self-pay | Admitting: General Surgery

## 2013-09-09 ENCOUNTER — Other Ambulatory Visit: Payer: Self-pay | Admitting: *Deleted

## 2013-09-09 DIAGNOSIS — H1013 Acute atopic conjunctivitis, bilateral: Secondary | ICD-10-CM

## 2013-09-09 DIAGNOSIS — J309 Allergic rhinitis, unspecified: Principal | ICD-10-CM

## 2013-09-10 ENCOUNTER — Other Ambulatory Visit: Payer: Self-pay | Admitting: *Deleted

## 2013-09-10 ENCOUNTER — Ambulatory Visit (INDEPENDENT_AMBULATORY_CARE_PROVIDER_SITE_OTHER): Payer: Medicaid Other | Admitting: Family Medicine

## 2013-09-10 ENCOUNTER — Encounter: Payer: Self-pay | Admitting: Family Medicine

## 2013-09-10 VITALS — BP 129/82 | HR 94 | Temp 98.2°F | Wt 170.0 lb

## 2013-09-10 DIAGNOSIS — Z23 Encounter for immunization: Secondary | ICD-10-CM

## 2013-09-10 DIAGNOSIS — J309 Allergic rhinitis, unspecified: Principal | ICD-10-CM

## 2013-09-10 DIAGNOSIS — B353 Tinea pedis: Secondary | ICD-10-CM | POA: Diagnosis not present

## 2013-09-10 DIAGNOSIS — H1013 Acute atopic conjunctivitis, bilateral: Secondary | ICD-10-CM

## 2013-09-10 NOTE — Assessment & Plan Note (Signed)
Exam and distribution consistent with fungal infection. Already showing improvement with OTC athlete's foot cream Plan: topical terbinafine cream, if not improved within 2 weeks asked to RTC. She was instructed to keep the area clean/dry, avoid using her current footwear, avoid closed toe footwear.

## 2013-09-10 NOTE — Progress Notes (Signed)
Patient ID: [name] [name], female   DOB: [birth year], 28 y.o.   MRN: [id number]   Subjective:    Patient ID: [name] [name], female    DOB: July 16, 1985, 28 y.o.   MRN: [id number]  HPI  CC: Rash on my feet  # Rash on both feet:  Present for 2 weeks  Had rash during her pregnancy on her back, diagnosed as fungal infection and told to use athlete's foot cream  She has been using an OTC athlete's foot cream on her feet, states she has noticed some improvement  Rash is very itchy, but not painful ROS: no fevers/chills, no nausea/vomiting, no difficulty swallowing  Review of Systems   See HPI for ROS. All other systems reviewed and are negative.  Past medical history, surgical, family, and social history reviewed and updated in the EMR. No new updates were made today. Objective:  BP 129/82  Pulse 94  Temp(Src) 98.2 F (36.8 C) (Oral)  Wt 170 lb (77.111 kg)  LMP 09/05/2013  Breastfeeding? No Vitals reviewed  General: NAD, pleasant Ext: erythematous and white scaly rash present bilateral feet in distribution of where sandal strap is in contact with top of foot. Smaller erythematous areas in between toes. No evidence of fungal infection of the toenails. Right dorsum of foot more proximal with healed scab/blister.   Assessment & Plan:  See Problem List Documentation

## 2013-09-10 NOTE — Patient Instructions (Addendum)
The rash on the feet looks like it is consistent with a fungal infection.   You have some refills left on the lamisil cream. Pick this up, if the refills aren't there ask the pharmacy to send a refill request and call the office to have the front office staff send a message to Dr. Waynetta Sandy since Dr. Deirdre Priest is not currently here.  Use the lamisil 1-2 times a day, cover the area with a light amount. Keep the toes and feet dry, away from moist shoes/socks.  Come back in 2 weeks if not improved

## 2013-09-11 MED ORDER — MONTELUKAST SODIUM 10 MG PO TABS: 10.0000 mg | ORAL_TABLET | Freq: Every day | ORAL | Status: DC

## 2013-09-16 ENCOUNTER — Emergency Department (HOSPITAL_COMMUNITY)
Admission: EM | Admit: 2013-09-16 | Discharge: 2013-09-17 | Disposition: A | Discharge status: Home / Self Care | Payer: Medicaid Other | Attending: Emergency Medicine | Admitting: Emergency Medicine

## 2013-09-16 ENCOUNTER — Encounter (HOSPITAL_COMMUNITY): Payer: Self-pay | Admitting: Emergency Medicine

## 2013-09-16 DIAGNOSIS — Z8659 Personal history of other mental and behavioral disorders: Secondary | ICD-10-CM | POA: Diagnosis not present

## 2013-09-16 DIAGNOSIS — IMO0002 Reserved for concepts with insufficient information to code with codable children: Secondary | ICD-10-CM | POA: Diagnosis not present

## 2013-09-16 DIAGNOSIS — J45909 Unspecified asthma, uncomplicated: Secondary | ICD-10-CM | POA: Diagnosis present

## 2013-09-16 DIAGNOSIS — Z872 Personal history of diseases of the skin and subcutaneous tissue: Secondary | ICD-10-CM | POA: Insufficient documentation

## 2013-09-16 DIAGNOSIS — Z8742 Personal history of other diseases of the female genital tract: Secondary | ICD-10-CM | POA: Insufficient documentation

## 2013-09-16 DIAGNOSIS — Z8701 Personal history of pneumonia (recurrent): Secondary | ICD-10-CM | POA: Insufficient documentation

## 2013-09-16 DIAGNOSIS — J45901 Unspecified asthma with (acute) exacerbation: Secondary | ICD-10-CM | POA: Insufficient documentation

## 2013-09-16 DIAGNOSIS — Z9851 Tubal ligation status: Secondary | ICD-10-CM | POA: Diagnosis not present

## 2013-09-16 DIAGNOSIS — R000 Tachycardia, unspecified: Secondary | ICD-10-CM | POA: Diagnosis not present

## 2013-09-16 DIAGNOSIS — Z79899 Other long term (current) drug therapy: Secondary | ICD-10-CM | POA: Diagnosis not present

## 2013-09-16 DIAGNOSIS — Z87891 Personal history of nicotine dependence: Secondary | ICD-10-CM | POA: Diagnosis not present

## 2013-09-16 MED ORDER — IPRATROPIUM BROMIDE 0.02 % IN SOLN
0.5000 mg | RESPIRATORY_TRACT | Status: AC
  Administered 2013-09-17: 0.5 mg via RESPIRATORY_TRACT
  Filled 2013-09-16: qty 2.5

## 2013-09-16 MED ORDER — IPRATROPIUM-ALBUTEROL 0.5-2.5 (3) MG/3ML IN SOLN
3.0000 mL | Freq: Once | RESPIRATORY_TRACT | Status: AC
  Administered 2013-09-16: 3 mL via RESPIRATORY_TRACT
  Filled 2013-09-16: qty 3

## 2013-09-16 MED ORDER — PREDNISONE 20 MG PO TABS
40.0000 mg | ORAL_TABLET | Freq: Once | ORAL | Status: AC
  Administered 2013-09-17: 40 mg via ORAL
  Filled 2013-09-16: qty 2

## 2013-09-16 MED ORDER — ALBUTEROL SULFATE (2.5 MG/3ML) 0.083% IN NEBU
5.0000 mg | INHALATION_SOLUTION | Freq: Once | RESPIRATORY_TRACT | Status: AC
  Administered 2013-09-17: 5 mg via RESPIRATORY_TRACT
  Filled 2013-09-16: qty 6

## 2013-09-16 NOTE — ED Provider Notes (Signed)
CSN: [id number]     Arrival date & time 09/16/13  2227 History   First MD Initiated Contact with Patient 09/16/13 2343     Chief Complaint  Patient presents with  . Asthma     (Consider location/radiation/quality/duration/timing/severity/associated sxs/prior Treatment) HPI Comments: 28 year old female with a history of asthma with frequent exacerbations who presents to the hospital with a complaint of shortness of breath. This has been ongoing over the last 24 hours and was predated by 2 days of upper respiratory symptoms including facial pressure, nasal drainage and postnasal drip. She has 2 children with similar symptoms. She has tried albuterol inhaler and nebulizer at home twice today with minimal improvement, last time she was on prednisone was 4 months ago. She has had to be admitted to the hospital in the past for her asthma. She denies any significant production to her cough but does have a frequent cough over the day today  Patient is a 28 y.o. female presenting with asthma. The history is provided by the patient.  Asthma    Past Medical History  Diagnosis Date  . Asthma   . Pregnant state, incidental   . Breast abscess   . Family history of anesthesia complication     Grandmother has difficulty waskin up.  . Depression     HX of - not on med n, doing well.  . Eczema   . Admission for sterilization 06/12/2013  . SVD (spontaneous vaginal delivery)     x 2  . Pneumonia     HX only -years ago has had a few times.  . S/P tubal ligation 06/13/2013   Past Surgical History  Procedure Laterality Date  . Incise and drain abcess      left breast x 2  . Incision and drainage abscess Left 02/17/2012    Procedure:  DRAINAGEof recurrent breast abscess;  Surgeon: Shelly Rubenstein, MD;  Location: [facility] OR;  Service: General;  Laterality: Left;  . Laparoscopic tubal ligation Bilateral 06/13/2013    Procedure: LAPAROSCOPIC TUBAL LIGATION;  Surgeon: Sherian Rein, MD;  Location: [facility]  ORS;  Service: Gynecology;  Laterality: Bilateral;   Family History  Problem Relation Age of Onset  . Healthy Mother   . Healthy Father   . Stroke Maternal Grandmother   . Stroke Paternal Grandfather    History  Substance Use Topics  . Smoking status: Former Smoker -- 0.50 packs/day for 5 years    Types: Cigarettes    Quit date: 06/22/2010  . Smokeless tobacco: Never Used  . Alcohol Use: No   OB History   Grav Para Term Preterm Abortions TAB SAB Ect Mult Living   Review of Systems  All other systems reviewed and are negative.     Allergies  Doxycycline  Home Medications   Prior to Admission medications   Medication Sig Start Date End Date Taking? Authorizing Provider  albuterol (PROVENTIL HFA;VENTOLIN HFA) 108 (90 BASE) MCG/ACT inhaler Inhale 2 puffs into the lungs every 4 (four) hours as needed for wheezing or shortness of breath.   Yes Historical Provider, MD  ibuprofen (ADVIL,MOTRIN) 800 MG tablet Take 800 mg by mouth every 8 (eight) hours as needed for moderate pain.   Yes Historical Provider, MD  loratadine (CLARITIN) 10 MG tablet Take 1 tablet (10 mg total) by mouth daily. 10/14/12  Yes Josalyn C Funches, MD  mometasone-formoterol (DULERA) 200-5 MCG/ACT AERO Inhale 2 puffs  into the lungs 2 (two) times daily. 01/04/13  Yes Glori Luis, MD  montelukast (SINGULAIR) 10 MG tablet Take 1 tablet (10 mg total) by mouth at bedtime. 09/11/13  Yes Carney Living, MD  terbinafine (LAMISIL) 1 % cream Apply 1 application topically 2 (two) times daily. 06/09/13  Yes Carney Living, MD  albuterol (PROVENTIL) (2.5 MG/3ML) 0.083% nebulizer solution Take 3 mLs (2.5 mg total) by nebulization every 6 (six) hours as needed for wheezing or shortness of breath. 09/17/13   Vida Roller, MD  azithromycin (ZITHROMAX) 250 MG tablet Take 1 tablet (250 mg total) by mouth daily. Take first 2 tablets together, then 1 every day until finished. 09/17/13   Vida Roller,  MD  predniSONE (DELTASONE) 20 MG tablet Take 2 tablets (40 mg total) by mouth daily. 09/17/13   Vida Roller, MD   BP 126/78  Pulse 78  Temp(Src) 98.9 F (37.2 C) (Oral)  Resp 19  SpO2 93%  LMP 09/05/2013 Physical Exam  Nursing note and vitals reviewed. Constitutional: She appears well-developed and well-nourished. No distress.  HENT:  Head: Normocephalic and atraumatic.  Mouth/Throat: Oropharynx is clear and moist. No oropharyngeal exudate.  Eyes: Conjunctivae and EOM are normal. Pupils are equal, round, and reactive to light. Right eye exhibits no discharge. Left eye exhibits no discharge. No scleral icterus.  Neck: Normal range of motion. Neck supple. No JVD present. No thyromegaly present.  Cardiovascular: Regular rhythm, normal heart sounds and intact distal pulses.  Exam reveals no gallop and no friction rub.   No murmur heard. Tachy  To 105  Pulmonary/Chest: Effort normal. No respiratory distress. She has wheezes. She has no rales.  Mild tachypnea, diffuse expiratory wheezing with prolonged expiratory phase, no rales, speaks in full sentences, no accessory muscle use  Abdominal: Soft. Bowel sounds are normal. She exhibits no distension and no mass. There is no tenderness.  Musculoskeletal: Normal range of motion. She exhibits no edema and no tenderness.  Lymphadenopathy:    She has no cervical adenopathy.  Neurological: She is alert. Coordination normal.  Skin: Skin is warm and dry. No rash noted. No erythema.  Psychiatric: She has a normal mood and affect. Her behavior is normal.    ED Course  Procedures (including critical care time) Labs Review Labs Reviewed - No data to display  Imaging Review No results found.    MDM   Final diagnoses:  Asthma attack    Asthma exacerbation likely triggered by upper respiratory infection, she will need steroids, albuterol treatment with Atrovent (states first treatment already health) her saturation is 95-96% on room air,  anticipate discharging patient home with antibiotics as well as prednisone and close followup. I suspect that her tachycardia is related to the bronchodilator use throughout the day.  No fever or hypotension.   Ambulated after neb without difficulty - felt better, less wheezing.  Meds given in ED:  Medications  ipratropium-albuterol (DUONEB) 0.5-2.5 (3) MG/3ML nebulizer solution 3 mL (3 mLs Nebulization Given 09/16/13 2238)  albuterol (PROVENTIL) (2.5 MG/3ML) 0.083% nebulizer solution 5 mg (5 mg Nebulization Given 09/17/13 0002)  ipratropium (ATROVENT) nebulizer solution 0.5 mg (0.5 mg Nebulization Given 09/17/13 0002)  predniSONE (DELTASONE) tablet 40 mg (40 mg Oral Given 09/17/13 0001)    New Prescriptions   ALBUTEROL (PROVENTIL) (2.5 MG/3ML) 0.083% NEBULIZER SOLUTION    Take 3 mLs (2.5 mg total) by nebulization every 6 (six) hours as needed for wheezing or shortness of breath.   AZITHROMYCIN (  ZITHROMAX) 250 MG TABLET    Take 1 tablet (250 mg total) by mouth daily. Take first 2 tablets together, then 1 every day until finished.   PREDNISONE (DELTASONE) 20 MG TABLET    Take 2 tablets (40 mg total) by mouth daily.      Vida Roller, MD 09/17/13 [phone]

## 2013-09-16 NOTE — ED Notes (Signed)
Pt reports wheezing starting today dt asthma. Pt reports trying multiple breathing tx at home with no relief. Pt noted to have bilateral exp/insp wheezing. NAD. VSS. Speaks in complete sentences.

## 2013-09-17 MED ORDER — ALBUTEROL SULFATE (2.5 MG/3ML) 0.083% IN NEBU: 2.5000 mg | INHALATION_SOLUTION | Freq: Four times a day (QID) | RESPIRATORY_TRACT | Status: DC | PRN

## 2013-09-17 MED ORDER — PREDNISONE 20 MG PO TABS: 40.0000 mg | ORAL_TABLET | Freq: Every day | ORAL | Status: DC

## 2013-09-17 MED ORDER — AZITHROMYCIN 250 MG PO TABS: 250.0000 mg | ORAL_TABLET | Freq: Every day | ORAL | Status: DC

## 2013-09-17 NOTE — Discharge Instructions (Signed)
Albuterol inhaler every 4 hours as needed for shortness of breath  Prednisone once a day for 5 days  Zithromax as prescribed  Please call your doctor for a followup appointment within 24-48 hours. When you talk to your doctor please let them know that you were seen in the emergency department and have them acquire all of your records so that they can discuss the findings with you and formulate a treatment plan to fully care for your new and ongoing problems.

## 2013-10-13 ENCOUNTER — Ambulatory Visit: Payer: Medicaid Other | Admitting: Family Medicine

## 2013-10-21 ENCOUNTER — Other Ambulatory Visit: Payer: Self-pay | Admitting: Family Medicine

## 2013-11-03 ENCOUNTER — Encounter (HOSPITAL_COMMUNITY): Payer: Self-pay | Admitting: Emergency Medicine

## 2013-11-20 ENCOUNTER — Other Ambulatory Visit: Payer: Self-pay | Admitting: Family Medicine

## 2014-01-08 ENCOUNTER — Other Ambulatory Visit: Payer: Self-pay | Admitting: General Surgery

## 2014-01-08 DIAGNOSIS — N644 Mastodynia: Secondary | ICD-10-CM

## 2014-01-09 ENCOUNTER — Other Ambulatory Visit: Payer: Medicaid Other

## 2014-01-09 ENCOUNTER — Ambulatory Visit
Admission: RE | Admit: 2014-01-09 | Discharge: 2014-01-09 | Disposition: A | Discharge status: Home / Self Care | Payer: 59 | Source: Ambulatory Visit | Attending: General Surgery | Admitting: General Surgery

## 2014-01-09 DIAGNOSIS — N644 Mastodynia: Secondary | ICD-10-CM

## 2014-01-13 ENCOUNTER — Encounter (HOSPITAL_COMMUNITY): Payer: Self-pay | Admitting: Psychiatry

## 2014-01-13 ENCOUNTER — Ambulatory Visit (INDEPENDENT_AMBULATORY_CARE_PROVIDER_SITE_OTHER): Payer: 59 | Admitting: Psychiatry

## 2014-01-13 VITALS — BP 120/69 | HR 107 | Ht 60.0 in | Wt 176.0 lb

## 2014-01-13 DIAGNOSIS — F411 Generalized anxiety disorder: Secondary | ICD-10-CM | POA: Insufficient documentation

## 2014-01-13 DIAGNOSIS — F331 Major depressive disorder, recurrent, moderate: Secondary | ICD-10-CM | POA: Insufficient documentation

## 2014-01-13 DIAGNOSIS — F17210 Nicotine dependence, cigarettes, uncomplicated: Secondary | ICD-10-CM

## 2014-01-13 DIAGNOSIS — Z0000 Encounter for general adult medical examination without abnormal findings: Secondary | ICD-10-CM

## 2014-01-13 MED ORDER — FLUOXETINE HCL 20 MG PO CAPS: 20.0000 mg | ORAL_CAPSULE | Freq: Every day | ORAL | Status: DC

## 2014-01-13 NOTE — Progress Notes (Signed)
Psychiatric Assessment Adult  Patient Identification:  [name] [name] Date of Evaluation:  01/13/2014 Chief Complaint: depression and mood swings History of Chief Complaint:   Chief Complaint  Patient presents with  . Depression    HPI Comments: Pt was referred by her therapist for mood control. Pt states she is depressed and has mood swings. 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 States it was fairly effective. After her second pregnancy (child is now 59 months old). Reports multiple stressors including- conflict with husband and step son, medical issues with daughter, conflict with family, personal medical issues and chronic pain. 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   Depression level is 6/10 on a daily basis. She reports anhedonia and has low motivation.  She sometimes isolates herself. Denies crying spells, changes in appetite and concentration problems. Denies SI/HI. On Celexa symptoms were more tolerable. Up to month ago she was drinking daily to cope with depression.   Also reports mood swings go from ok to irritable. Stress tolerance is low. Denies changes in sleep pattern or increased energy. States she would get more impulsive in being physically aggressive with objects but never with people. Denies excessive spending.   Review of Systems Physical Exam  Psychiatric: Her speech is normal and behavior is normal. Judgment and thought content normal. Her mood appears anxious. Cognition and memory are normal. She exhibits a depressed mood.    Depressive Symptoms: depressed mood, anhedonia, insomnia, fatigue, feelings of worthlessness/guilt, hopelessness, impaired memory, loss of energy/fatigue,  (Hypo) Manic Symptoms:   Elevated Mood:  No Irritable Mood:  Yes Grandiosity:  No Distractibility:  No Labiality of Mood:  Yes Delusions:  No Hallucinations:  No Impulsivity:   No Sexually Inappropriate Behavior:  No Financial Extravagance:  No Flight of Ideas:  No  Anxiety Symptoms: Excessive Worry:  Yes especially when driving feels like she is going to be hit. Also occurs when going into crowds. Spends 3-4 hours worrying about money, constantly checking her 2 kids thru out the night to make sure they are breathing.  Panic Symptoms:  Yes twice in her entire life Agoraphobia:  No Obsessive Compulsive: No  Symptoms: None, Specific Phobias:  No Social Anxiety:  No  Psychotic Symptoms:  Hallucinations: No None Delusions:  No Paranoia:  No   Ideas of Reference:  No  PTSD Symptoms: Ever had a traumatic exposure:  No Had a traumatic exposure in the last month:  No Re-experiencing: No None Hypervigilance:  No Hyperarousal: No None Avoidance: No None  Traumatic Brain Injury: No   Past Psychiatric History: Diagnosis: PPD  Hospitalizations: denies  Outpatient Care: PCP managing depression   Substance Abuse Care: denies  Self-Mutilation: denies  Suicidal Attempts: denies, denies current access to guns  Violent Behaviors: denies   Past Medical History:   Past Medical History  Diagnosis Date  . Asthma   . Pregnant state, incidental   . Breast abscess   . Family history of anesthesia complication     Grandmother has difficulty waskin up.  . Depression     HX of - not on med n, doing well.  . Eczema   . Admission for sterilization 06/12/2013  . SVD (spontaneous vaginal delivery)     x 2  . Pneumonia     HX only -years ago has had a few times.  . S/P tubal ligation 06/13/2013   History of Loss  of Consciousness:  No Seizure History:  No Cardiac History:  No Allergies:   Allergies  Allergen Reactions  . Doxycycline Photosensitivity   Current Medications:  Current Outpatient Prescriptions  Medication Sig Dispense Refill  . montelukast (SINGULAIR) 10 MG tablet Take 1 tablet (10 mg total) by mouth at bedtime. 30 tablet 11  . PROAIR HFA 108 (90  BASE) MCG/ACT inhaler INHALE 2 PUFFS BY MOUTH EVERY 2 HOURS AS NEEDED FOR SHORTNESS OF BREATH 17 g 11  . triamcinolone cream (KENALOG) 0.5 % Apply 1 application topically 3 (three) times daily.    Marland Kitchen. azithromycin (ZITHROMAX) 250 MG tablet Take 1 tablet (250 mg total) by mouth daily. Take first 2 tablets together, then 1 every day until finished. (Patient not taking: Reported on 01/13/2014) 6 tablet 0  . hydrOXYzine (ATARAX/VISTARIL) 25 MG tablet Take 25 mg by mouth 2 (two) times daily as needed for itching.    Marland Kitchen. ibuprofen (ADVIL,MOTRIN) 800 MG tablet Take 800 mg by mouth every 8 (eight) hours as needed for moderate pain.    Marland Kitchen. loratadine (CLARITIN) 10 MG tablet TAKE 1 TABLET BY MOUTH EVERY DAY (Patient not taking: Reported on 01/13/2014) 146 tablet 1  . mometasone-formoterol (DULERA) 200-5 MCG/ACT AERO Inhale 2 puffs into the lungs 2 (two) times daily. (Patient not taking: Reported on 01/13/2014) 1 Inhaler 2  . predniSONE (DELTASONE) 20 MG tablet Take 2 tablets (40 mg total) by mouth daily. (Patient not taking: Reported on 01/13/2014) 10 tablet 0  . terbinafine (LAMISIL) 1 % cream Apply 1 application topically 2 (two) times daily. (Patient not taking: Reported on 01/13/2014) 30 g 3   No current facility-administered medications for this visit.    Previous Psychotropic Medications:  Medication Dose  Celexa   Wellbutrin- ineffective   Ambien                Substance Abuse History in the last 12 months: Substance Age of 1st Use Last Use Amount Specific Type  Nicotine  18 today 1ppd cigs  Alcohol  21 2 weeks ago In December was drinking 5-6 beers daily and then cut back to about once a week beer  Cannabis  denies     Opiates  denies     Cocaine  denies     Methamphetamines  denies     LSD  denies     Ecstasy  denies     Benzodiazepines  denies     Caffeine   today 3-4 cups of coffee per day   Inhalants  denies        Others: denies                         Medical Consequences of  Substance Abuse: denies  Legal Consequences of Substance Abuse: denies  Family Consequences of Substance Abuse: denies  Blackouts:  No DT's:  No Withdrawal Symptoms:  No None  Social History: Current Place of Residence: [place] with husband and kids Place of Birth: [place] and raised in [place]. Awesome childhood. 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 Legal History: denies Hobbies/Interests: arts/crafts  Family History:   Family History  Problem Relation Age of Onset  . Healthy Mother   .  Anxiety disorder Mother   . Healthy Father   . Stroke Maternal Grandmother   . Stroke Paternal Grandfather   . Bipolar disorder Neg Hx   . Depression Neg Hx     Mental Status Examination/Evaluation: Objective: Attitude: Calm and cooperative  Appearance: Casual, appears to be stated age  Eye Contact::  Fair  Speech:  Clear and Coherent and Normal Rate  Volume:  Normal  Mood:  depressed  Affect:  Congruent  Thought Process:  Goal Directed, Linear and Logical  Orientation:  Full (Time, Place, and Person)  Thought Content:  Negative  Suicidal Thoughts:  No  Homicidal Thoughts:  No  Judgement:  Fair  Insight:  Fair  Concentration: good  Memory: Immediate-fair Recent-fair Remote-fair  Recall: fair  Language: fair  Gait and Station: normal  [employer] of Knowledge: average  Psychomotor Activity:  Normal  Akathisia:  No  Handed:  Right  AIMS (if indicated):  Facial and Oral Movements  Muscles of Facial Expression: None, normal  Lips and Perioral Area: None, normal  Jaw: None, normal  Tongue: None, normal Extremity Movements: Upper (arms,  wrists, hands, fingers): None, normal  Lower (legs, knees, ankles, toes): None, normal,  Trunk Movements:  Neck, shoulders, hips: None, normal,  Overall Severity : Severity of abnormal movements (highest score from questions above): None, normal  Incapacitation due to abnormal movements: None, normal  Patient's awareness of abnormal movements (rate only patient's report): No Awareness, Dental Status  Current problems with teeth and/or dentures?: No  Does patient usually wear dentures?: No    Assets:  Communication Skills Desire for Improvement Housing Social Support       Filed Vitals:   01/13/14 1005  BP: 120/69  Pulse: 107  Height: 5' (1.524 m)  Weight: 176 lb (79.833 kg)    Laboratory/X-Ray Psychological Evaluation(s)  Reviewed labs 06/13/13 platelets elevated none   Assessment:  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 Today endorsing symptoms of Major Depression and GAD.   AXIS I MDD- recurrent, moderate; GAD; Nicotine dependence  AXIS II Deferred  AXIS III Past Medical History  Diagnosis Date  . Asthma   . Pregnant state, incidental   . Breast abscess   . Family history of anesthesia complication     Grandmother has difficulty waskin up.  . Depression     HX of - not on med n, doing well.  . Eczema   . Admission for sterilization 06/12/2013  . SVD (spontaneous vaginal delivery)     x 2  . Pneumonia     HX only -years ago has had a few times.  . S/P tubal ligation 06/13/2013     AXIS IV economic problems and other psychosocial or environmental problems  AXIS V 51-60 moderate symptoms   Treatment Plan/Recommendations:  Plan of Care:  Medication management with supportive therapy. Risks/benefits and SE of the medication discussed. Pt verbalized understanding and verbal consent obtained for treatment.  Affirm with the patient that the medications are taken as ordered. Patient expressed understanding of how their  medications were to be used.   Confidentiality and exclusions reviewed with pt who verbalized understanding.   Laboratory:  CBC Chemistry Profile Folic Acid GGT UDS Vitamin H-08 TSH    Psychotherapy:  Therapy: brief supportive therapy provided. Discussed psychosocial stressors in detail.     Medications: Prozac  po qD for mood and anxiety  Discussed possible use of Ambien  in future if  sleep doesn't improve  Routine PRN Medications:  No  Consultations: encouraged to continue individual therapy with Ms. Cannon  Safety Concerns: Pt denies SI and is at an acute low risk for suicide.Patient told to call clinic if any problems occur. Patient advised to go to ER if they should develop SI/HI, side effects, or if symptoms worsen. Has crisis numbers to call if needed. Pt verbalized understanding.   Other:  F/up in 2 months or sooner if needed     Oletta Darter, MD 1/12/201610:23 AM

## 2014-01-21 ENCOUNTER — Ambulatory Visit: Payer: Self-pay | Admitting: Family Medicine

## 2014-01-27 LAB — COMPLETE METABOLIC PANEL WITH GFR
ALT: <8 U/L (ref 0–35)
AST: 11 U/L (ref 0–37)
Albumin: 4 g/dL (ref 3.5–5.2)
Alkaline Phosphatase: 52 U/L (ref 39–117)
BUN: 7 mg/dL (ref 6–23)
CO2: 24 meq/L (ref 19–32)
Calcium: 8.8 mg/dL (ref 8.4–10.5)
Chloride: 104 mEq/L (ref 96–112)
Creat: 0.69 mg/dL (ref 0.50–1.10)
GFR, Est African American: >89 mL/min
GFR, Est Non African American: >89 mL/min
GLUCOSE: 96 mg/dL (ref 70–99)
Potassium: 4.2 mEq/L (ref 3.5–5.3)
SODIUM: 136 meq/L (ref 135–145)
TOTAL PROTEIN: 6 g/dL (ref 6.0–8.3)
Total Bilirubin: 0.3 mg/dL (ref 0.2–1.2)

## 2014-01-27 LAB — CBC
HCT: 44.3 % (ref 36.0–46.0)
Hemoglobin: 15 g/dL (ref 12.0–15.0)
MCH: 32 pg (ref 26.0–34.0)
MCHC: 33.9 g/dL (ref 30.0–36.0)
MCV: 94.5 fL (ref 78.0–100.0)
MPV: 8.5 fL — AB (ref 8.6–12.4)
Platelets: 398 10*3/uL (ref 150–400)
RBC: 4.69 MIL/uL (ref 3.87–5.11)
RDW: 13.6 % (ref 11.5–15.5)
WBC: 10.7 10*3/uL — ABNORMAL HIGH (ref 4.0–10.5)

## 2014-01-27 LAB — DRUG SCREEN, URINE
AMPHETAMINE SCRN UR: NEGATIVE
BENZODIAZEPINES.: NEGATIVE
Barbiturate Quant, Ur: NEGATIVE
COCAINE METABOLITES: NEGATIVE
Creatinine,U: 64.7 mg/dL
Marijuana Metabolite: NEGATIVE
Methadone: NEGATIVE
Opiates: NEGATIVE
PHENCYCLIDINE (PCP): NEGATIVE
Propoxyphene: NEGATIVE

## 2014-01-27 LAB — GAMMA GT: GGT: 10 U/L (ref 7–51)

## 2014-01-27 LAB — VITAMIN B12: Vitamin B-12: 371 pg/mL (ref 211–911)

## 2014-01-27 LAB — FOLATE: FOLATE: 4.7 ng/mL

## 2014-01-27 LAB — TSH: TSH: 2.296 u[IU]/mL (ref 0.350–4.500)

## 2014-02-19 ENCOUNTER — Telehealth: Payer: Self-pay | Admitting: *Deleted

## 2014-02-19 NOTE — Telephone Encounter (Signed)
[facility].   He is concerned because pt is using her proair inhaler on a daily basis instead of a rescue inhaler. She is getting a refill every 10 days.  [name] [name], [name] RochesterJessica [name]

## 2014-02-19 NOTE — Telephone Encounter (Signed)
Will forward to MD and see if patient needs to come in and be evaluated.  Runette Scifres,CMA

## 2014-02-24 IMAGING — CR DG CHEST 2V
2 series · 2 of 2 positions shown · non-contrast
Comparison: 11/20/2012.

CLINICAL DATA: Cough and shortness of Breath.

EXAM:
CHEST  2 VIEW

[w chest pa]
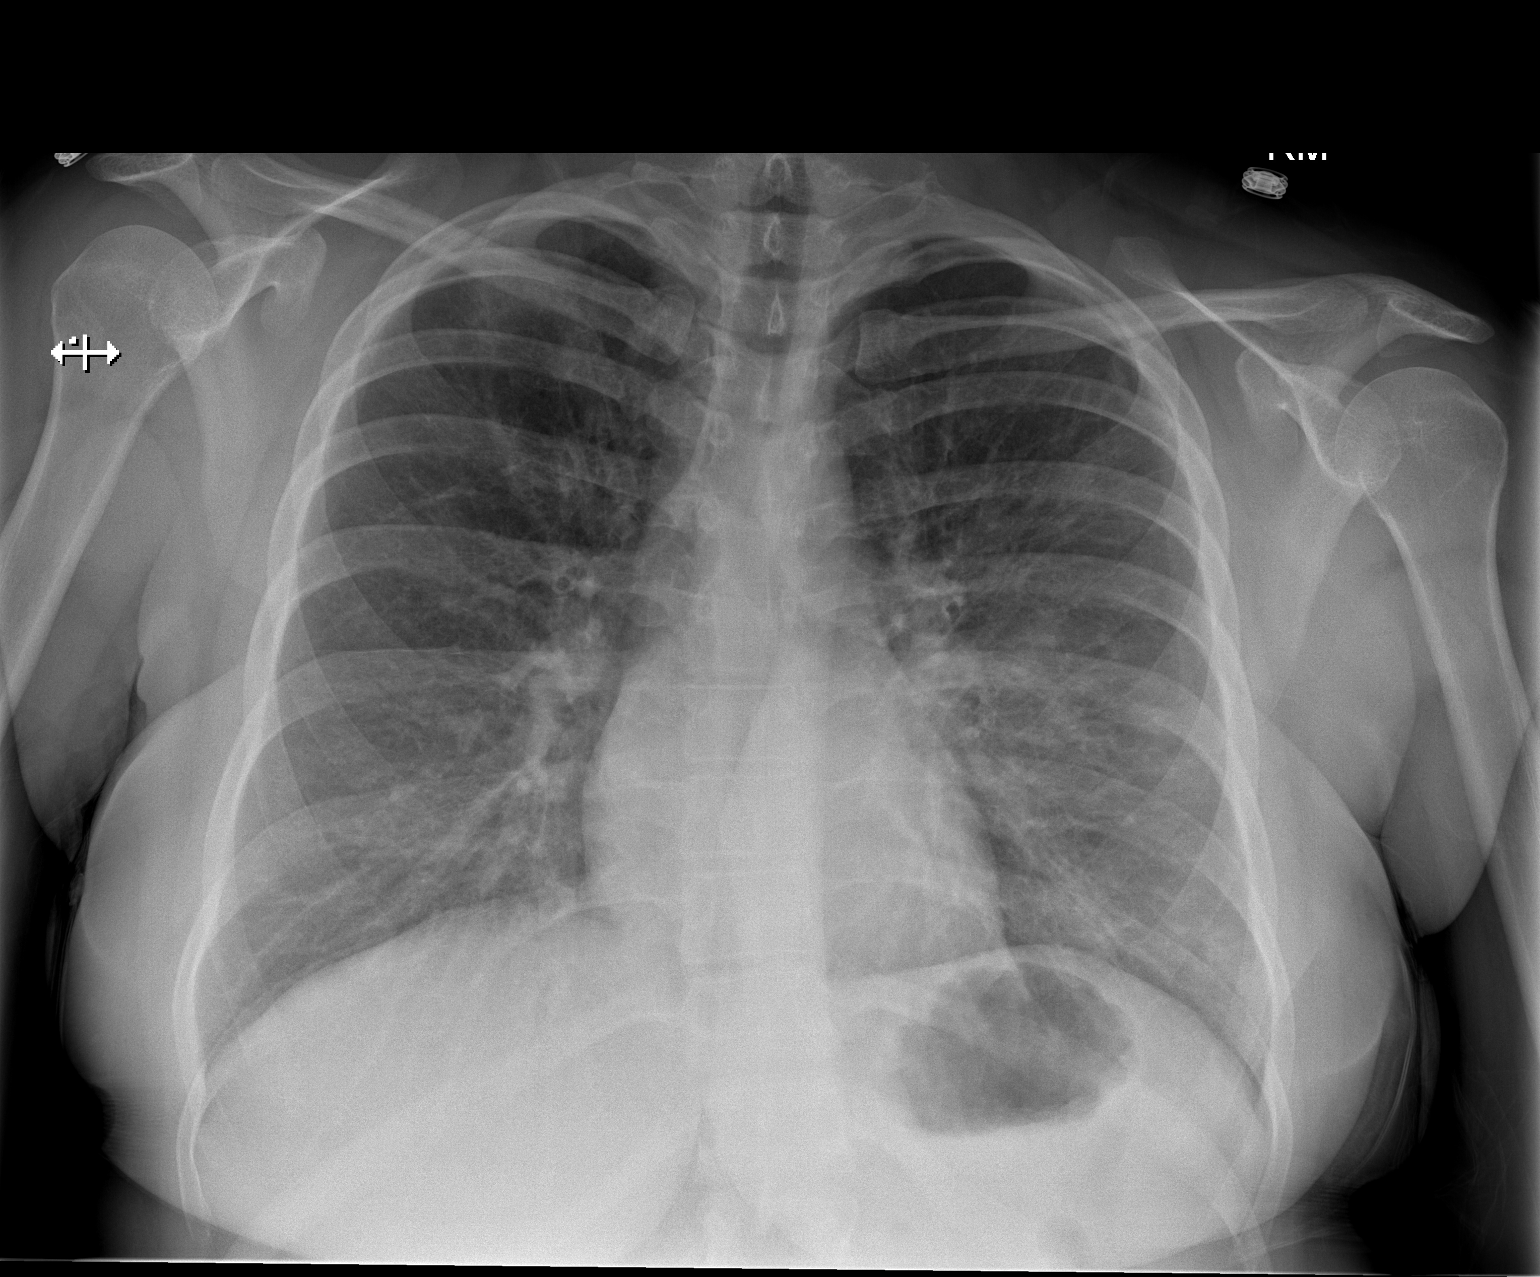

[w chest lat]
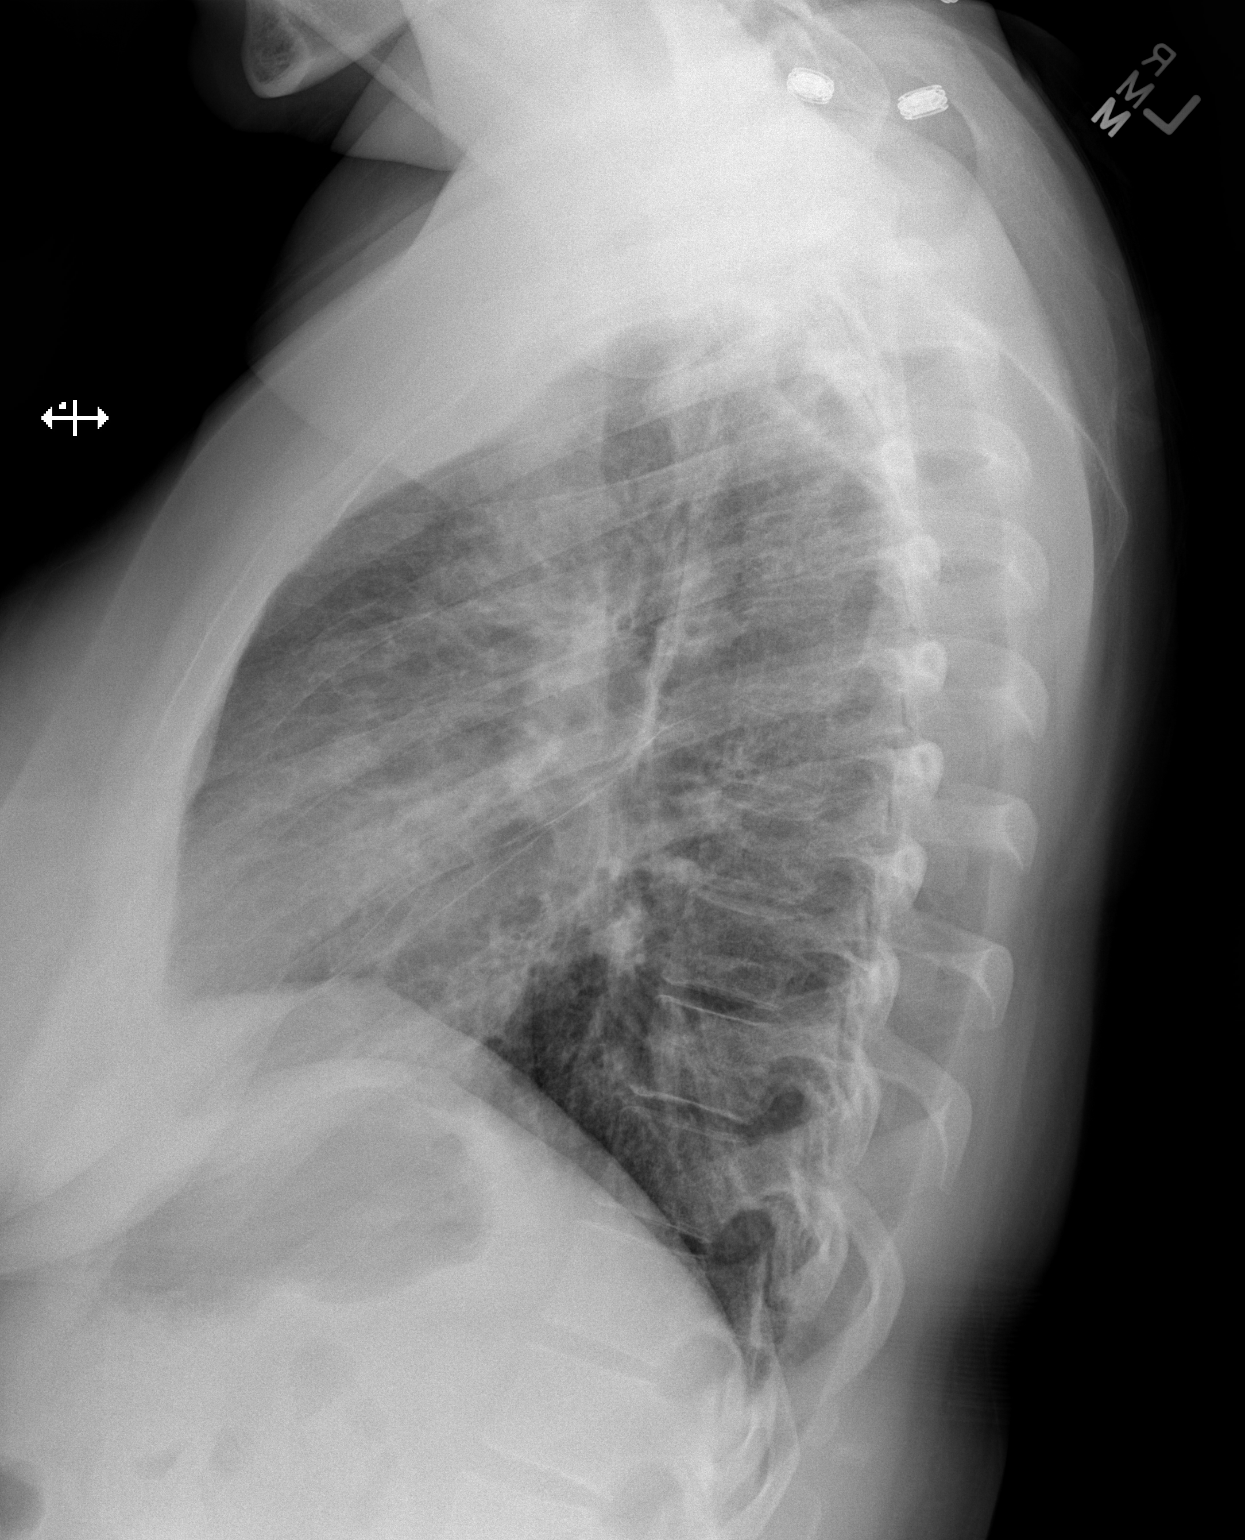

[2 of 2 positions shown; findings below may reference images not displayed]

FINDINGS: The cardiac silhouette, mediastinal and hilar contours are within
normal limits and stable. There is mild peribronchial thickening and
increased interstitial markings suggesting reactive airways disease
or bronchitis. No focal infiltrates or effusions. The bony thorax is
intact.
IMPRESSION: Findings suggest bronchitis or reactive airways disease.

## 2014-03-12 ENCOUNTER — Encounter: Payer: Self-pay | Admitting: Family Medicine

## 2014-03-17 ENCOUNTER — Ambulatory Visit (HOSPITAL_COMMUNITY): Payer: Self-pay | Admitting: Psychiatry

## 2014-03-24 ENCOUNTER — Encounter (HOSPITAL_COMMUNITY): Payer: Self-pay | Admitting: Psychiatry

## 2014-03-24 ENCOUNTER — Ambulatory Visit (INDEPENDENT_AMBULATORY_CARE_PROVIDER_SITE_OTHER): Payer: 59 | Admitting: Psychiatry

## 2014-03-24 VITALS — BP 125/75 | HR 96 | Ht 60.0 in | Wt 173.0 lb

## 2014-03-24 DIAGNOSIS — F411 Generalized anxiety disorder: Secondary | ICD-10-CM

## 2014-03-24 DIAGNOSIS — F331 Major depressive disorder, recurrent, moderate: Secondary | ICD-10-CM

## 2014-03-24 DIAGNOSIS — F17200 Nicotine dependence, unspecified, uncomplicated: Secondary | ICD-10-CM

## 2014-03-24 MED ORDER — VENLAFAXINE HCL ER 150 MG PO CP24: 150.0000 mg | ORAL_CAPSULE | Freq: Every day | ORAL | Status: DC

## 2014-03-24 NOTE — Progress Notes (Signed)
[facility] [phone] Progress Note  [name] [name] [id number] 29 y.o.  03/24/2014 1:49 PM  Chief Complaint: the medicine is a joke  History of Present Illness: States Prozac helps her sleep but not with mood at all. Pt sleep is broken due to her 74 month daughter. Energy and appetite are low. Concentration is poor.   States she is depressed and very irritable. Pt has low stress tolerance and she is snapping at people. Her daily level of depression is 7/10. Reports anhedonia, isolation, low motivation, low libido, worthlessness and hopelessness.   Pt is cleaning her house thru out the day. Her house is very clean. States she can't relax if she see's something is dirty or out of place.   Pt has a lot of anxiety while driving. She worries constantly that someone is going to hit her while her kids in the car. At home not experiencing much anxiety. She does experience anxiety when she goes out into crowds. She feels everyone is watching her and talking about her.   Taking Prozac as prescribed and endorsing SE of sleepiness.   Suicidal Ideation: No Plan Formed: No Patient has means to carry out plan: No  Homicidal Ideation: No Plan Formed: No Patient has means to carry out plan: No  Review of Systems: Psychiatric: Agitation: Yes Hallucination: No Depressed Mood: Yes Insomnia: No Hypersomnia: No Altered Concentration: Yes Feels Worthless: Yes Grandiose Ideas: No Belief In Special Powers: No New/Increased Substance Abuse: No Compulsions: possibly  Neurologic: Headache: No Seizure: No Paresthesias: No   Review of Systems  Constitutional: Negative for fever, chills and weight loss.  HENT: Negative for congestion, ear pain, nosebleeds and sore throat.   Eyes: Negative for blurred vision, double vision and pain.  Respiratory: Negative for cough, shortness of breath and wheezing.   Cardiovascular: Negative for chest pain, palpitations and leg swelling.   Gastrointestinal: Positive for heartburn. Negative for nausea, vomiting and abdominal pain.  Musculoskeletal: Negative for back pain, joint pain and neck pain.  Skin: Negative for itching and rash.  Neurological: Positive for dizziness. Negative for sensory change, seizures and headaches.  Psychiatric/Behavioral: Positive for depression. Negative for suicidal ideas, hallucinations and substance abuse. The patient is nervous/anxious. The patient does not have insomnia.      Past Medical Family, Social History: lives in [place] with her husband and 3 kids. She was raised in [place] by her parents and has one sister. States she had an "awesome childhood". 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  reports that she has been smoking Cigarettes.  She has a 5 pack-year smoking history. She has never used smokeless tobacco. She reports that she drinks alcohol. She reports that she does not use illicit drugs.  Family History  Problem Relation Age of Onset  . Healthy Mother   . Anxiety disorder Mother   . Healthy Father   . Stroke Maternal Grandmother   . Stroke Paternal Grandfather   . Bipolar disorder Neg Hx   . Depression Neg Hx     Past Medical History  Diagnosis Date  . Asthma   . Pregnant state, incidental   . Breast abscess   . Family history of anesthesia complication     Grandmother has difficulty waskin up.  . Depression     HX of - not on med n, doing well.  . Eczema   . Admission for sterilization 06/12/2013  . SVD (spontaneous vaginal delivery)     x 2  .  Pneumonia     HX only -years ago has had a few times.  . S/P tubal ligation 06/13/2013     Outpatient Encounter Prescriptions as of 03/24/2014  Medication Sig  . FLUoxetine (PROZAC) 20 MG capsule Take 1 capsule (20 mg total) by mouth daily.  . hydrOXYzine (ATARAX/VISTARIL) 25 MG tablet Take 25 mg by mouth 2 (two) times daily as needed for itching.  Marland Kitchen ibuprofen (ADVIL,MOTRIN) 800 MG tablet Take 800 mg by mouth every 8  (eight) hours as needed for moderate pain.  Marland Kitchen loratadine (CLARITIN) 10 MG tablet TAKE 1 TABLET BY MOUTH EVERY DAY  . montelukast (SINGULAIR) 10 MG tablet Take 1 tablet (10 mg total) by mouth at bedtime.  Marland Kitchen PROAIR HFA 108 (90 BASE) MCG/ACT inhaler INHALE 2 PUFFS BY MOUTH EVERY 2 HOURS AS NEEDED FOR SHORTNESS OF BREATH  . azithromycin (ZITHROMAX) 250 MG tablet Take 1 tablet (250 mg total) by mouth daily. Take first 2 tablets together, then 1 every day until finished. (Patient not taking: Reported on 01/13/2014)  . mometasone-formoterol (DULERA) 200-5 MCG/ACT AERO Inhale 2 puffs into the lungs 2 (two) times daily. (Patient not taking: Reported on 01/13/2014)  . predniSONE (DELTASONE) 20 MG tablet Take 2 tablets (40 mg total) by mouth daily. (Patient not taking: Reported on 01/13/2014)  . terbinafine (LAMISIL) 1 % cream Apply 1 application topically 2 (two) times daily. (Patient not taking: Reported on 01/13/2014)  . triamcinolone cream (KENALOG) 0.5 % Apply 1 application topically 3 (three) times daily.     Recent Results (from the past 2160 hour(s))  CBC     Status: Abnormal   Collection Time: 01/26/14  4:05 PM  Result Value Ref Range   WBC 10.7 (H) 4.0 - 10.5 K/uL   RBC 4.69 3.87 - 5.11 MIL/uL   Hemoglobin 15.0 12.0 - 15.0 g/dL   HCT 44.3 36.0 - 46.0 %   MCV 94.5 78.0 - 100.0 fL   MCH 32.0 26.0 - 34.0 pg   MCHC 33.9 30.0 - 36.0 g/dL   RDW 13.6 11.5 - 15.5 %   Platelets 398 150 - 400 K/uL   MPV 8.5 (L) 8.6 - 12.4 fL    Comment: ** Please note change in reference range(s). **  COMPLETE METABOLIC PANEL WITH GFR     Status: None   Collection Time: 01/26/14  4:05 PM  Result Value Ref Range   Sodium 136 135 - 145 mEq/L   Potassium 4.2 3.5 - 5.3 mEq/L   Chloride 104 96 - 112 mEq/L   CO2 24 19 - 32 mEq/L   Glucose, Bld 96 70 - 99 mg/dL   BUN 7 6 - 23 mg/dL   Creat 0.69 0.50 - 1.10 mg/dL   Total Bilirubin 0.3 0.2 - 1.2 mg/dL   Alkaline Phosphatase 52 39 - 117 U/L   AST 11 0 - 37 U/L   ALT  <8 0 - 35 U/L   Total Protein 6.0 6.0 - 8.3 g/dL   Albumin 4.0 3.5 - 5.2 g/dL   Calcium 8.8 8.4 - 10.5 mg/dL   GFR, Est African American >89 mL/min   GFR, Est Non African American >89 mL/min    Comment:   The estimated GFR is a calculation valid for adults (>=6 years old) that uses the CKD-EPI algorithm to adjust for age and sex. It is   not to be used for children, pregnant women, hospitalized patients,    patients on dialysis, or with rapidly changing kidney function. According  to the NKDEP, eGFR >89 is normal, 60-89 shows mild impairment, 30-59 shows moderate impairment, 15-29 shows severe impairment and <15 is ESRD.     Folate     Status: None   Collection Time: 01/26/14  4:05 PM  Result Value Ref Range   Folate 4.7 ng/mL    Comment:   Reference Ranges         Deficient:       0.4 - 3.3 ng/mL         Indeterminate:   3.4 - 5.4 ng/mL         Normal:              > 5.4 ng/mL     Gamma GT     Status: None   Collection Time: 01/26/14  4:05 PM  Result Value Ref Range   GGT 10 7 - 51 U/L  Vitamin B12     Status: None   Collection Time: 01/26/14  4:05 PM  Result Value Ref Range   Vitamin B-12 371 211 - 911 pg/mL  TSH     Status: None   Collection Time: 01/26/14  4:05 PM  Result Value Ref Range   TSH 2.296 0.350 - 4.500 uIU/mL  Drug Screen, Urine     Status: None   Collection Time: 01/26/14  4:05 PM  Result Value Ref Range   Benzodiazepines. NEG Negative   Phencyclidine (PCP) NEG Negative   Cocaine Metabolites NEG Negative   Amphetamine Screen, Ur NEG Negative   Marijuana Metabolite NEG Negative   Opiates NEG Negative   Barbiturate Quant, Ur NEG Negative   Methadone NEG Negative   Propoxyphene NEG Negative   Creatinine,U 64.70 mg/dL    Comment:    Cutoff Values for Urine Drug Screen, Pain Mgmt          Drug Class           Cutoff (ng/mL)          Amphetamines             500          Barbiturates             200          Cocaine Metabolites      150           Benzodiazepines          200          Methadone                300          Opiates                  300          Phencyclidine             25          Propoxyphene             300          Marijuana Metabolites     50    For medical purposes only.        Past Psychiatric History/Hospitalization(s): Anxiety: No Bipolar Disorder: No Depression: Yes Mania: No Psychosis: No Schizophrenia: No Personality Disorder: No Hospitalization for psychiatric illness: No History of Electroconvulsive Shock Therapy: No Prior Suicide Attempts: No  Physical Exam: Constitutional:  BP 125/75 mmHg  Pulse 96  Ht 5' (1.524 m)  Wt 173 lb (78.472 kg)  BMI 33.79 kg/m2  General Appearance: alert, oriented, no acute distress  Musculoskeletal: Strength & Muscle Tone: within normal limits Gait & Station: normal Patient leans: N/A  Mental Status Examination/Evaluation: Objective: Attitude: Calm and cooperative  Appearance: Casual, appears to be stated age  Eye Contact::  Fair  Speech:  Clear and Coherent and Normal Rate  Volume:  Normal  Mood:  depression  Affect:  Depressed  Thought Process:  Goal Directed, Linear and Logical  Orientation:  Full (Time, Place, and Person)  Thought Content:  Negative  Suicidal Thoughts:  No  Homicidal Thoughts:  No  Judgement:  Fair  Insight:  Fair  Concentration: good  Memory: Immediate-fair Recent-fair Remote-fair  Recall: fair  Language: fair  Gait and Station: normal  [employer] of Knowledge: average  Psychomotor Activity:  Normal  Akathisia:  No  Handed:  Right  AIMS (if indicated):  Facial and Oral Movements  Muscles of Facial Expression: None, normal  Lips and Perioral Area: None, normal  Jaw: None, normal  Tongue: None, normal Extremity Movements: Upper (arms, wrists, hands, fingers): None, normal  Lower (legs, knees, ankles, toes): None, normal,  Trunk Movements:  Neck, shoulders, hips: None, normal,  Overall Severity  : Severity of abnormal movements (highest score from questions above): None, normal  Incapacitation due to abnormal movements: None, normal  Patient's awareness of abnormal movements (rate only patient's report): No Awareness, Dental Status  Current problems with teeth and/or dentures?: No  Does patient usually wear dentures?: No    Assets:  Communication Skills Desire for Improvement       Medical Decision Making (Choose Three): Review of Psycho-Social Stressors (1), Review or order clinical lab tests (1), Established Problem, Worsening (2), Review of Last Therapy Session (1), Review of Medication Regimen & Side Effects (2) and Review of New Medication or Change in Dosage (2)  Assessment: AXIS I MDD- recurrent, moderate; GAD; Nicotine dependence  AXIS II Deferred  AXIS III Past Medical History  Diagnosis Date  . Asthma   . Pregnant state, incidental   . Breast abscess   . Family history of anesthesia complication     Grandmother has difficulty waskin up.  . Depression     HX of - not on med n, doing well.  . Eczema   . Admission for sterilization 06/12/2013  . SVD (spontaneous vaginal delivery)     x 2  . Pneumonia     HX only -years ago has had a few times.  . S/P tubal ligation 06/13/2013     AXIS IV economic problems and other psychosocial or environmental problems  AXIS V 51-60 moderate symptoms   Treatment Plan/Recommendations:  Plan of Care: Medication management with supportive therapy. Risks/benefits and SE of the medication discussed. Pt verbalized understanding and verbal consent obtained for treatment. Affirm with the patient that the medications are taken as ordered. Patient expressed understanding of how their medications were to be used.    Laboratory: reviewed labs with pt CBC- 10.7 Chemistry Profile WNL Folic Acid WNL GGT WNL UDS neg Vitamin B-12 WNL TSH WNL   Psychotherapy: Therapy:  brief supportive therapy provided. Discussed psychosocial stressors in detail.    Medications: d/c Prozac   Start trial of Effexor XR 153m po qD for mood and anxiety  Discussed possible use of Ambien 569min future if sleep doesn't improve. Declined at this time due to caring for her baby at night.   Routine PRN Medications: No  Consultations: encouraged to continue individual  therapy with Ms. Cannon  Safety Concerns: Pt denies SI and is at an acute low risk for suicide.Patient told to call clinic if any problems occur. Patient advised to go to ER if they should develop SI/HI, side effects, or if symptoms worsen. Has crisis numbers to call if needed. Pt verbalized understanding.   Other: F/up in 2 months or sooner if needed           Charlcie Cradle, MD 03/24/2014

## 2014-05-01 ENCOUNTER — Encounter (HOSPITAL_COMMUNITY): Payer: Self-pay | Admitting: Emergency Medicine

## 2014-05-01 ENCOUNTER — Emergency Department (INDEPENDENT_AMBULATORY_CARE_PROVIDER_SITE_OTHER)
Admission: EM | Admit: 2014-05-01 | Discharge: 2014-05-01 | Disposition: A | Discharge status: Home / Self Care | Payer: 59 | Source: Home / Self Care | Attending: Family Medicine | Admitting: Family Medicine

## 2014-05-01 DIAGNOSIS — N12 Tubulo-interstitial nephritis, not specified as acute or chronic: Secondary | ICD-10-CM

## 2014-05-01 MED ORDER — CEFDINIR 300 MG PO CAPS: 300.0000 mg | ORAL_CAPSULE | Freq: Two times a day (BID) | ORAL | Status: DC

## 2014-05-01 NOTE — ED Provider Notes (Signed)
[name] [name] is a 29 y.o. female who presents to Urgent Care today for urinary frequency urgency and dysuria present for the last 2 days. Patient developed right-sided low back pain today. No fevers chills nausea vomiting or diarrhea. She's tried AZO which did not help.   Past Medical History  Diagnosis Date  . Asthma   . Pregnant state, incidental   . Breast abscess   . Family history of anesthesia complication     Grandmother has difficulty waskin up.  . Depression     HX of - not on med n, doing well.  . Eczema   . Admission for sterilization 06/12/2013  . SVD (spontaneous vaginal delivery)     x 2  . Pneumonia     HX only -years ago has had a few times.  . S/P tubal ligation 06/13/2013   Past Surgical History  Procedure Laterality Date  . Incise and drain abcess      left breast x 2  . Incision and drainage abscess Left 02/17/2012    Procedure:  DRAINAGEof recurrent breast abscess;  Surgeon: Shelly Rubensteinouglas A Blackman, MD;  Location: [facility] OR;  Service: General;  Laterality: Left;  . Laparoscopic tubal ligation Bilateral 06/13/2013    Procedure: LAPAROSCOPIC TUBAL LIGATION;  Surgeon: Sherian ReinJody Bovard-Stuckert, MD;  Location: [facility] ORS;  Service: Gynecology;  Laterality: Bilateral;   History  Substance Use Topics  . Smoking status: Current Every Day Smoker -- 1.00 packs/day for 5 years    Types: Cigarettes    Last Attempt to Quit: 06/22/2010  . Smokeless tobacco: Never Used  . Alcohol Use: Yes     Comment: daily up until mid December 2015. Since then it is down to 1 beer a week   ROS as above Medications: No current facility-administered medications for this encounter.   Current Outpatient Prescriptions  Medication Sig Dispense Refill  . cefdinir (OMNICEF) 300 MG capsule Take 1 capsule (300 mg total) by mouth 2 (two) times daily. 14 capsule 0  . hydrOXYzine (ATARAX/VISTARIL) 25 MG tablet Take 25 mg by mouth 2 (two) times daily as needed for itching.    Marland Kitchen. ibuprofen (ADVIL,MOTRIN) 800  MG tablet Take 800 mg by mouth every 8 (eight) hours as needed for moderate pain.    Marland Kitchen. loratadine (CLARITIN) 10 MG tablet TAKE 1 TABLET BY MOUTH EVERY DAY 146 tablet 1  . montelukast (SINGULAIR) 10 MG tablet Take 1 tablet (10 mg total) by mouth at bedtime. 30 tablet 11  . PROAIR HFA 108 (90 BASE) MCG/ACT inhaler INHALE 2 PUFFS BY MOUTH EVERY 2 HOURS AS NEEDED FOR SHORTNESS OF BREATH 17 g 11  . triamcinolone cream (KENALOG) 0.5 % Apply 1 application topically 3 (three) times daily.    Marland Kitchen. venlafaxine XR (EFFEXOR-XR) 150 MG 24 hr capsule Take 1 capsule (150 mg total) by mouth daily. 30 capsule 1  . [DISCONTINUED] mometasone-formoterol (DULERA) 200-5 MCG/ACT AERO Inhale 2 puffs into the lungs 2 (two) times daily. (Patient not taking: Reported on 01/13/2014) 1 Inhaler 2   Allergies  Allergen Reactions  . Doxycycline Photosensitivity     Exam:  BP 131/95 mmHg  Pulse 90  Temp(Src) 99.5 F (37.5 C) (Oral)  Resp 14  SpO2 100% Gen: Well NAD HEENT: EOMI,  MMM Lungs: Normal work of breathing. CTABL Heart: RRR no MRG Abd: NABS, Soft. Nondistended, Nontender to percussion right CV angle. Exts: Brisk capillary refill, warm and well perfused.   Urinalysis dipstick is positive for moderate blood trace proteins and large  leukocyte esterase.  Urine pregnancy test is negative.  No results found for this or any previous visit (from the past 24 hour(s)). No results found.  Assessment and Plan: 29 y.o. female with pyelonephritis. Treat with oral Omnicef. Culture pending. Follow-up as needed.  Discussed warning signs or symptoms. Please see discharge instructions. Patient expresses understanding.     Rodolph Bong, MD 05/01/14 [phone]

## 2014-05-01 NOTE — ED Notes (Signed)
C/o UTI sx onset Wednesday Sx include back pain, urinary freq/urgency, abd pain, hematuria Denies fevers, chills, dysuria Alert, no signs of acute distress.

## 2014-05-01 NOTE — Discharge Instructions (Signed)
Thank you for coming in today. If your belly pain worsens, or you have high fever, bad vomiting, blood in your stool or black tarry stool go to the Emergency Room.    Pyelonephritis, Adult Pyelonephritis is a kidney infection. In general, there are 2 main types of pyelonephritis:  Infections that come on quickly without any warning (acute pyelonephritis).  Infections that persist for a long period of time (chronic pyelonephritis). CAUSES  Two main causes of pyelonephritis are:  Bacteria traveling from the bladder to the kidney. This is a problem especially in pregnant women. The urine in the bladder can become filled with bacteria from multiple causes, including:  Inflammation of the prostate gland (prostatitis).  Sexual intercourse in females.  Bladder infection (cystitis).  Bacteria traveling from the bloodstream to the tissue part of the kidney. Problems that may increase your risk of getting a kidney infection include:  Diabetes.  Kidney stones or bladder stones.  Cancer.  Catheters placed in the bladder.  Other abnormalities of the kidney or ureter. SYMPTOMS   Abdominal pain.  Pain in the side or flank area.  Fever.  Chills.  Upset stomach.  Blood in the urine (dark urine).  Frequent urination.  Strong or persistent urge to urinate.  Burning or stinging when urinating. DIAGNOSIS  Your caregiver may diagnose your kidney infection based on your symptoms. A urine sample may also be taken. TREATMENT  In general, treatment depends on how severe the infection is.   If the infection is mild and caught early, your caregiver may treat you with oral antibiotics and send you home.  If the infection is more severe, the bacteria may have gotten into the bloodstream. This will require intravenous (IV) antibiotics and a hospital stay. Symptoms may include:  High fever.  Severe flank pain.  Shaking chills.  Even after a hospital stay, your caregiver may require  you to be on oral antibiotics for a period of time.  Other treatments may be required depending upon the cause of the infection. HOME CARE INSTRUCTIONS   Take your antibiotics as directed. Finish them even if you start to feel better.  Make an appointment to have your urine checked to make sure the infection is gone.  Drink enough fluids to keep your urine clear or pale yellow.  Take medicines for the bladder if you have urgency and frequency of urination as directed by your caregiver. SEEK IMMEDIATE MEDICAL CARE IF:   You have a fever or persistent symptoms for more than 2-3 days.  You have a fever and your symptoms suddenly get worse.  You are unable to take your antibiotics or fluids.  You develop shaking chills.  You experience extreme weakness or fainting.  There is no improvement after 2 days of treatment. MAKE SURE YOU:  Understand these instructions.  Will watch your condition.  Will get help right away if you are not doing well or get worse. Document Released: 12/19/2004 Document Revised: 06/20/2011 Document Reviewed: 05/25/2010 [facility] Patient Information 2015 [place]. This information is not intended to replace advice given to you by your health care provider. Make sure you discuss any questions you have with your health care provider.

## 2014-05-02 LAB — POCT PREGNANCY, URINE: Preg Test, Ur: NEGATIVE

## 2014-05-03 LAB — URINE CULTURE
Colony Count: >=100000
Special Requests: NORMAL

## 2014-05-04 ENCOUNTER — Telehealth (HOSPITAL_COMMUNITY): Payer: Self-pay

## 2014-05-04 LAB — POCT URINALYSIS DIP (DEVICE)
Bilirubin Urine: NEGATIVE
GLUCOSE, UA: NEGATIVE mg/dL
KETONES UR: NEGATIVE mg/dL
NITRITE: NEGATIVE
PROTEIN: NEGATIVE mg/dL
SPECIFIC GRAVITY, URINE: 1.01 (ref 1.005–1.030)
Urobilinogen, UA: 0.2 mg/dL (ref 0.0–1.0)
pH: 7 (ref 5.0–8.0)

## 2014-05-04 NOTE — ED Notes (Signed)
Urine culture: >100,000 colonies E. Coli.  Pt. adequately treated with Omnicef. [name] [name], [name] [name] 05/04/2014

## 2014-05-04 NOTE — Telephone Encounter (Signed)
Post ED Visit - Positive Culture Follow-up  Culture report reviewed by antimicrobial stewardship pharmacist: []  Wes Dulaney, Pharm.D., BCPS [x]  Celedonio MiyamotoJeremy Frens, Pharm.D., BCPS []  Georgina PillionElizabeth Martin, Pharm.D., BCPS []  AltaMinh Pham, [address]Pharm[address]D., BCPS, AAHIVP []  Estella HuskMichelle Turner, Pharm.D., BCPS, AAHIVP []  Elder CyphersLorie Poole, [address]Pharm[address]D., BCPS  Positive Urine culture, 100,000 colonies -> E Coli Treated with Cefdinir, organism sensitive to the same and no further patient follow-up is required at this time.  Arvid RightClark, Julias Mould Dorn 05/04/2014, 6:46 PM

## 2014-05-26 ENCOUNTER — Ambulatory Visit (HOSPITAL_COMMUNITY): Payer: Self-pay | Admitting: Psychiatry

## 2014-05-28 ENCOUNTER — Encounter (HOSPITAL_COMMUNITY): Payer: Self-pay | Admitting: Psychiatry

## 2014-05-28 ENCOUNTER — Ambulatory Visit (INDEPENDENT_AMBULATORY_CARE_PROVIDER_SITE_OTHER): Payer: 59 | Admitting: Psychiatry

## 2014-05-28 VITALS — BP 123/75 | HR 96 | Ht 60.0 in | Wt 172.8 lb

## 2014-05-28 DIAGNOSIS — F331 Major depressive disorder, recurrent, moderate: Secondary | ICD-10-CM

## 2014-05-28 DIAGNOSIS — F411 Generalized anxiety disorder: Secondary | ICD-10-CM | POA: Diagnosis not present

## 2014-05-28 DIAGNOSIS — Z0000 Encounter for general adult medical examination without abnormal findings: Secondary | ICD-10-CM

## 2014-05-28 MED ORDER — QUETIAPINE FUMARATE 50 MG PO TABS: 50.0000 mg | ORAL_TABLET | Freq: Every day | ORAL | Status: DC

## 2014-05-28 MED ORDER — FLUOXETINE HCL 20 MG PO CAPS: 20.0000 mg | ORAL_CAPSULE | Freq: Every day | ORAL | Status: DC

## 2014-05-28 NOTE — Progress Notes (Signed)
BH MD/PA/NP OP Progress Note  05/28/2014 9:07 AM [name] [name]  MRN:  [id number]  Subjective:   Pt reports she has missed 2-3 doses of Effexor and notes she has extreme dizziness until she takes it again. She is not comfortable with this and would like to try a different antidepressant.   She is having random panic attacks at least twice a week. States panic attacks were few before starting Effexor.   Anxiety is improving when in crowds. She still has a lot of anxiety when driving. At home not experiencing much anxiety.   Pt reports "crazy mood swings" where she goes from being ok to irritable. Denies low stress tolerance.   Depression is improving. Reports anhedonia, isolation, low motivation, low libido, worthlessness and hopelessness are slowly improving. She feels happier overall.   3 weeks ago she has passive SI without plan or intent. Today denies SI/HI.   Sleep remains broken and she is getting about 5-6 hrs/night. Energy is low. Appetite is poor and concentration is ok.   Chief Complaint: "this medication has SE" Chief Complaint    Follow-up     Visit Diagnosis:     ICD-9-CM ICD-10-CM   1. MDD (major depressive disorder), recurrent episode, moderate 296.32 F33.1 FLUoxetine (PROZAC) 20 MG capsule     QUEtiapine (SEROQUEL) 50 MG tablet  2. GAD (generalized anxiety disorder) 300.02 F41.1 FLUoxetine (PROZAC) 20 MG capsule  3. Routine general medical examination at a health care facility V70.0 Z00.00 EKG    Past Medical History:  Past Medical History  Diagnosis Date  . Asthma   . Pregnant state, incidental   . Breast abscess   . Family history of anesthesia complication     Grandmother has difficulty waskin up.  . Depression     HX of - not on med n, doing well.  . Eczema   . Admission for sterilization 06/12/2013  . SVD (spontaneous vaginal delivery)     x 2  . Pneumonia     HX only -years ago has had a few times.  . S/P tubal ligation 06/13/2013    Past  Surgical History  Procedure Laterality Date  . Incise and drain abcess      left breast x 2  . Incision and drainage abscess Left 02/17/2012    Procedure:  DRAINAGEof recurrent breast abscess;  Surgeon: Shelly Rubenstein, MD;  Location: [facility];  Service: General;  Laterality: Left;  . Laparoscopic tubal ligation Bilateral 06/13/2013    Procedure: LAPAROSCOPIC TUBAL LIGATION;  Surgeon: Sherian Rein, MD;  Location: [facility];  Service: Gynecology;  Laterality: Bilateral;   Family History:  Family History  Problem Relation Age of Onset  . Healthy Mother   . Anxiety disorder Mother   . Healthy Father   . Stroke Maternal Grandmother   . Stroke Paternal Grandfather   . Bipolar disorder Neg Hx   . Depression Neg Hx    Social History:  History   Social History  . Marital Status: Married    Spouse Name: N/A  . Number of Children: N/A  . Years of Education: N/A   Social History Main Topics  . Smoking status: Current Every Day Smoker -- 1.00 packs/day for 5 years    Types: Cigarettes    Last Attempt to Quit: 06/22/2010  . Smokeless tobacco: Never Used  . Alcohol Use: Yes     Comment: daily up until mid December 2015. Since then it is down to 1 beer a week  .  Drug Use: No  . Sexual Activity: Yes    Birth Control/ Protection: None   Other Topics Concern  . None   Social History Narrative   Lives with boyfriend Eligah East and his son Sheria Lang 2006   Not currently working   lives in [place] with her husband and 3 kids. She was raised in [place] by her parents and has one sister. States she had an "awesome childhood". 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  reports that she has been smoking Cigarettes. She has a 5 pack-year smoking history. She has never used smokeless tobacco. She reports that she drinks alcohol. She reports that she does not use illicit drugs.  Additional History: Past Psychiatric History/Hospitalization(s): Anxiety: No Bipolar Disorder:  No Depression: Yes Mania: No Psychosis: No Schizophrenia: No Personality Disorder: No Hospitalization for psychiatric illness: No History of Electroconvulsive Shock Therapy: No Prior Suicide Attempts: No Failed meds- Celexa, Wellbutrin, Effexor  Assessment:   Musculoskeletal: Strength & Muscle Tone: within normal limits Gait & Station: normal Patient leans: no leaning. straight  Psychiatric Specialty Exam: HPI  Review of Systems  Constitutional: Negative for fever, chills and weight loss.  HENT: Negative for congestion, ear pain, nosebleeds and sore throat.   Eyes: Negative for blurred vision, double vision and pain.  Respiratory: Negative for cough, sputum production and shortness of breath.   Cardiovascular: Negative for chest pain, palpitations and leg swelling.  Gastrointestinal: Negative for heartburn, nausea, vomiting and abdominal pain.  Musculoskeletal: Negative for myalgias, back pain, joint pain and neck pain.  Skin: Negative for itching and rash.  Neurological: Negative for dizziness, sensory change, seizures, loss of consciousness and headaches.  Psychiatric/Behavioral: Positive for depression. Negative for suicidal ideas, hallucinations and substance abuse. The patient is nervous/anxious and has insomnia.     Blood pressure 123/75, pulse 96, height 5' (1.524 m), weight 172 lb 12.8 oz (78.382 kg), last menstrual period 04/07/2014, not currently breastfeeding.Body mass index is 33.75 kg/(m^2).  General Appearance: Casual  Eye Contact:  Fair  Speech:  Clear and Coherent and Normal Rate  Volume:  Normal  Mood:  Depressed  Affect:  Congruent  Thought Process:  Goal Directed  Orientation:  Full (Time, Place, and Person)  Thought Content:  Negative  Suicidal Thoughts:  No  Homicidal Thoughts:  No  Memory:  Immediate;   Fair Recent;   Fair Remote;   Fair  Judgement:  Fair  Insight:  Good  Psychomotor Activity:  Normal  Concentration:  Good  Recall:  Good  Fund  of Knowledge: Good  Language: Good  Akathisia:  No  Handed:  Right  AIMS (if indicated):  n/a  Assets:  Communication Skills Desire for Improvement Financial Resources/Insurance Housing Intimacy Resilience Social Support Talents/Skills Transportation  ADL's:  Intact  Cognition: WNL  Sleep:  poor   Is the patient at risk to self?  No. Has the patient been a risk to self in the past 6 months?  Yes.   Has the patient been a risk to self within the distant past?  No.  Is the patient a risk to others?  No. Has the patient been a risk to others in the past 6 months?  No. Has the patient been a risk to others within the distant past?  No.  Current Medications: Current Outpatient Prescriptions  Medication Sig Dispense Refill  . cefdinir (OMNICEF) 300 MG capsule Take 1 capsule (300 mg total) by mouth 2 (two) times daily. 14  capsule 0  . hydrOXYzine (ATARAX/VISTARIL) 25 MG tablet Take 25 mg by mouth 2 (two) times daily as needed for itching.    Marland Kitchen. ibuprofen (ADVIL,MOTRIN) 800 MG tablet Take 800 mg by mouth every 8 (eight) hours as needed for moderate pain.    Marland Kitchen. loratadine (CLARITIN) 10 MG tablet TAKE 1 TABLET BY MOUTH EVERY DAY 146 tablet 1  . montelukast (SINGULAIR) 10 MG tablet Take 1 tablet (10 mg total) by mouth at bedtime. 30 tablet 11  . PROAIR HFA 108 (90 BASE) MCG/ACT inhaler INHALE 2 PUFFS BY MOUTH EVERY 2 HOURS AS NEEDED FOR SHORTNESS OF BREATH 17 g 11  . triamcinolone cream (KENALOG) 0.5 % Apply 1 application topically 3 (three) times daily.    Marland Kitchen. venlafaxine XR (EFFEXOR-XR) 150 MG 24 hr capsule Take 1 capsule (150 mg total) by mouth daily. 30 capsule 1  . [DISCONTINUED] mometasone-formoterol (DULERA) 200-5 MCG/ACT AERO Inhale 2 puffs into the lungs 2 (two) times daily. (Patient not taking: Reported on 01/13/2014) 1 Inhaler 2   No current facility-administered medications for this visit.    Medical Decision Making:  Established Problem, Stable/Improving (1), Review of  Psycho-Social Stressors (1), Review or order clinical lab tests (1), Established Problem, Worsening (2), Review of Medication Regimen & Side Effects (2) and Review of New Medication or Change in Dosage (2)  Treatment Plan Summary:Medication management and Plan see below  AXIS I MDD- recurrent, moderate; GAD; Nicotine dependence  AXIS II Deferred   Treatment Plan/Recommendations:  Plan of Care: Medication management with supportive therapy. Risks/benefits and SE of the medication discussed. Pt verbalized understanding and verbal consent obtained for treatment. Affirm with the patient that the medications are taken as ordered. Patient expressed understanding of how their medications were to be used.    Laboratory: reviewed labs with pt CBC- 10.7 Chemistry Profile WNL Folic Acid WNL GGT WNL UDS neg Vitamin B-12 WNL TSH WNL  Order EKG   Psychotherapy: Therapy: brief supportive therapy provided. Discussed psychosocial stressors in detail.    Medications: restart Prozac 20mg  po qD for mood and anxiety  Start trial of Seroquel 50mg  po qHS for mood augmenation and sleep  D/c Effexor XR- pt is aware she may have some withdrawal symptoms    Routine PRN Medications: No  Consultations: encouraged to continue individual therapy with Ms. Cannon  Safety Concerns: Pt denies SI and is at an acute low risk for suicide.Patient told to call clinic if any problems occur. Patient advised to go to ER if they should develop SI/HI, side effects, or if symptoms worsen. Has crisis numbers to call if needed. Pt verbalized understanding.   Other: F/up in 2 months or sooner if needed with Dr. Rutherford Limerickadepalli and 59mo with Dr. Kelle DartingAgarwal                  Tonda Wiederhold 05/28/2014, 9:07 AM

## 2014-05-28 NOTE — Patient Instructions (Signed)
EKG call [phone] 

## 2014-06-06 IMAGING — US US OB DETAIL+14 WK
1 series · 15 of 28 positions shown · non-contrast
Comparison: none

OBSTETRICS REPORT
                      (Signed Final 04/14/2013 [DATE])

Service(s) Provided
 US OB DETAIL + 14 WK                                  76811.0
Indications
 Detailed fetal anatomic survey
 Shortened long bones
 Poor obstetric history: Previous preeclampsia /
 eclampsia/gestational HTN
 Poor obstetric history: Previous preterm delivery
Fetal Evaluation
 Num Of Fetuses:    1
 Fetal Heart Rate:  144                          bpm
 Cardiac Activity:  Observed
 Presentation:      Cephalic
 Placenta:          Anterior, above cervical os
 P. Cord            Not well visualized
 Insertion:
 Amniotic Fluid
 AFI FV:      Subjectively within normal limits
 AFI Sum:     11.38   cm       29  %Tile     Larg Pckt:    3.31  cm
 RUQ:   2.59    cm   RLQ:    2.73   cm    LUQ:   3.31    cm   LLQ:    2.75   cm
Biometry
 BPD:     79.4  mm     G. Age:  31w 6d                CI:         72.0   70 - 86
 OFD:    110.3  mm                                    FL/HC:      18.2   19.4 -
 HC:     305.4  mm     G. Age:  34w 0d       19  %    HC/AC:      1.05   0.96 -
 AC:     289.8  mm     G. Age:  33w 0d       28  %    FL/BPD:     70.2   71 - 87
 FL:      55.7  mm     G. Age:  29w 2d      < 3  %    FL/AC:      19.2   20 - 24
 HUM:     51.6  mm     G. Age:  30w 1d      < 5  %
 ULN:     41.4  mm     G. Age:  26w 6d      < 5  %
 TIB:     48.7  mm     G. Age:  29w 2d      < 5  %
 RAD:     42.3  mm     G. Age:  29w 2d       23  %
 FIB:     47.6  mm     G. Age:  29w 2d       19  %
 Est. FW:    5232  gm      4 lb 2 oz     24  %
Gestational Age
 U/S Today:     32w 0d                                        EDD:   06/09/13
 Best:          33w 6d     Det. By:  LMP  (08/20/12)          EDD:   05/27/13
Anatomy
 Cranium:          Appears normal         Aortic Arch:      Appears normal
 Fetal Cavum:      Appears normal         Ductal Arch:      Not well visualized
 Ventricles:       Appears normal         Diaphragm:        Appears normal
 Choroid Plexus:   Appears normal         Stomach:          Appears normal, left
                                                            sided
 Cerebellum:       Appears normal         Abdomen:          Appears normal
 Posterior Fossa:  Appears normal         Abdominal Wall:   Not well visualized
 Nuchal Fold:      Not applicable (>20    Cord Vessels:     2 vessel cord,
                   wks GA)
                                                            absent Dham Dah
 Face:             Appears normal         Kidneys:          Appear normal
                   (orbits and profile)
 Lips:             Appears normal         Bladder:          Appears normal
 Palate:           Appears normal         Spine:            Not well visualized
 Heart:            Appears normal         Lower             Visualized
                   (4CH, axis, and        Extremities:
                   situs)
 RVOT:             Not well visualized    Upper             Visualized
                                          Extremities:
 LVOT:             Appears normal
 Other:  Fetus appears to be a female.
Cervix Uterus Adnexa
 Cervix:       Not visualized (advanced GA >23wks)
Impression
INDICATION: 27 yr old OXVIEIE at 00w9d for fetal ultrasound.
 Finding of single umbilical artery and shortened long bones
 on outside ultrasound. Ichaoui Niaz of [facility]

[Series 1: us ob detail+14 wk · 0.28mm/px · 15 of 101 slices shown]
[im 1/101]
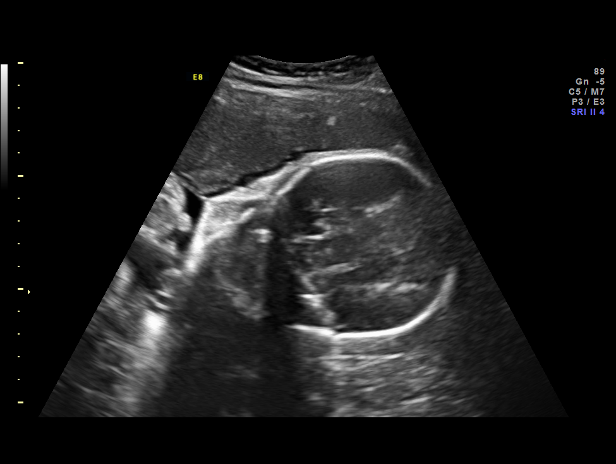
[im 8/101]
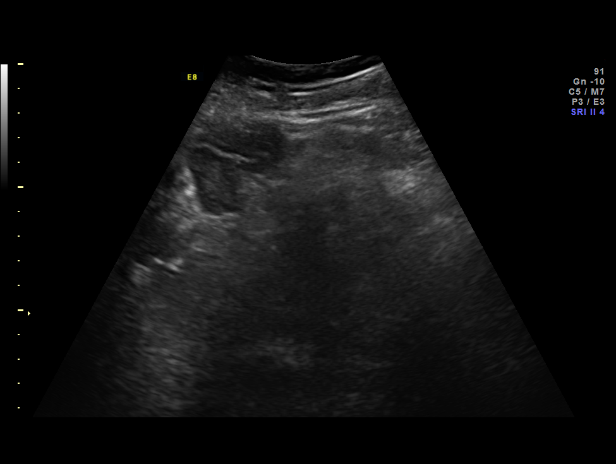
[im 15/101]
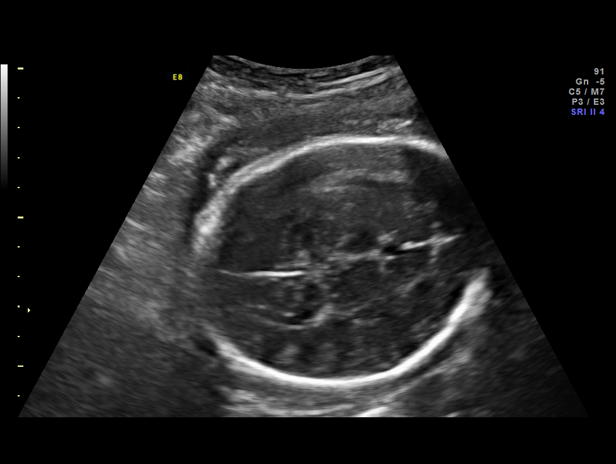
[im 23/101]
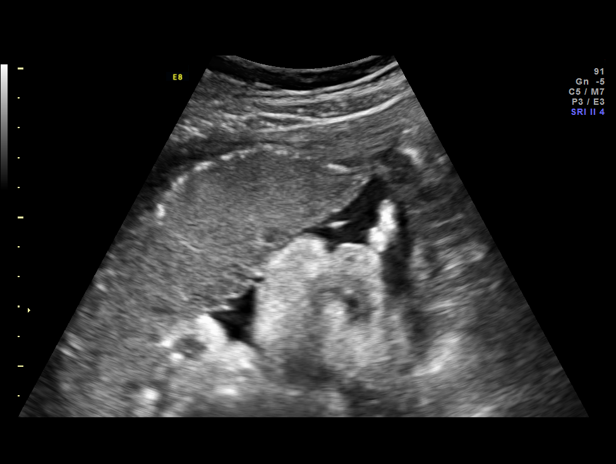
[im 30/101]
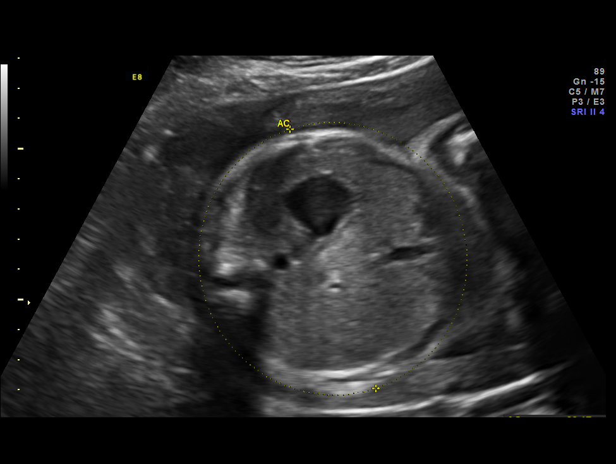
[im 38/101]
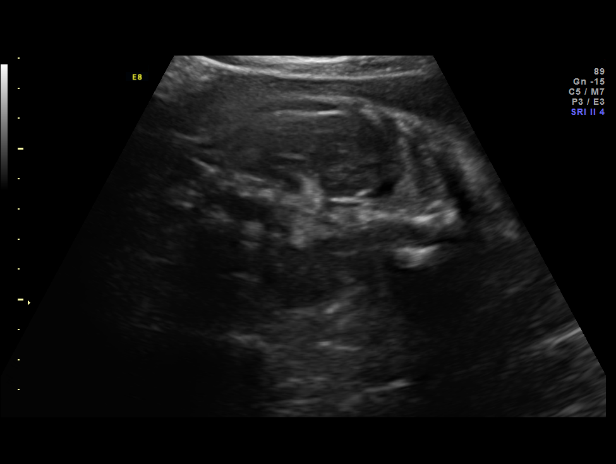
[im 45/101]
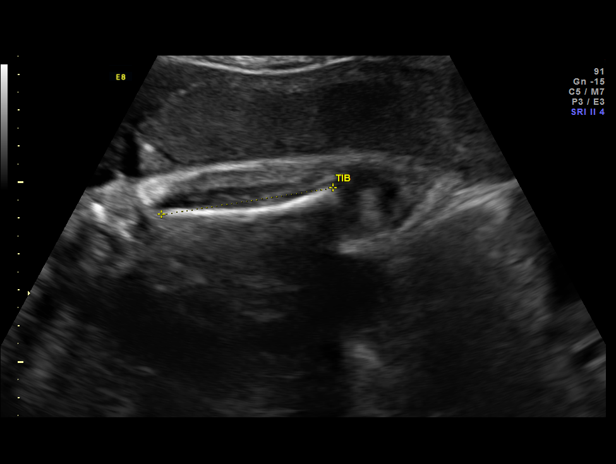
[im 52/101]
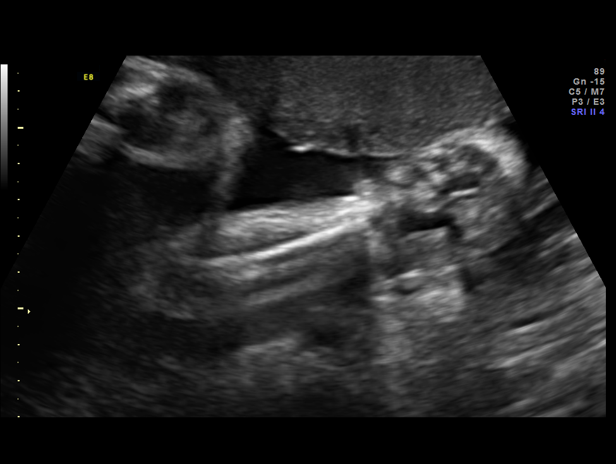
[im 56/101]
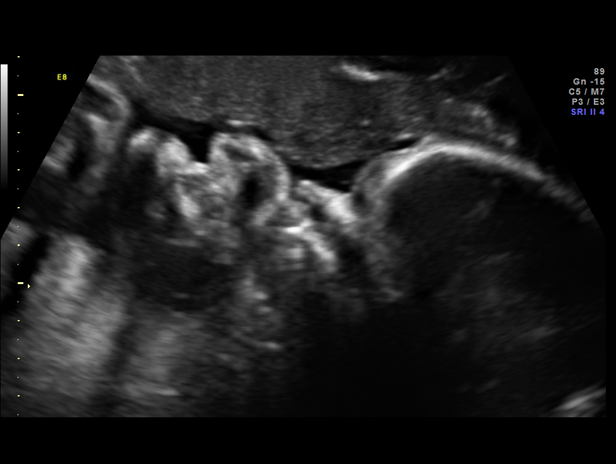
[im 63/101]
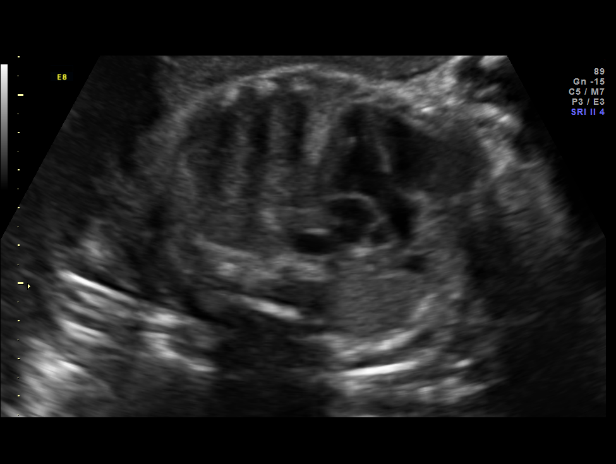
[im 71/101]
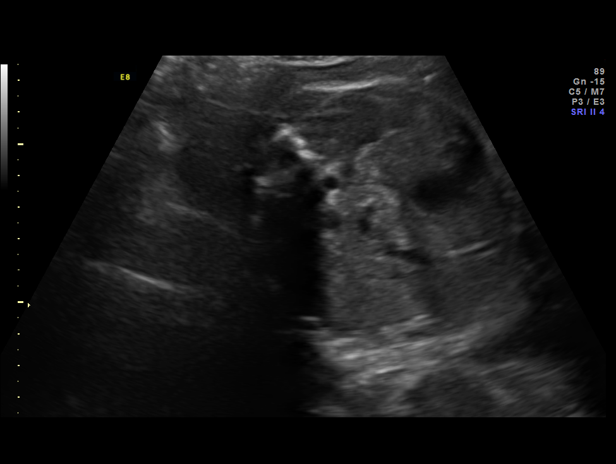
[im 78/101]
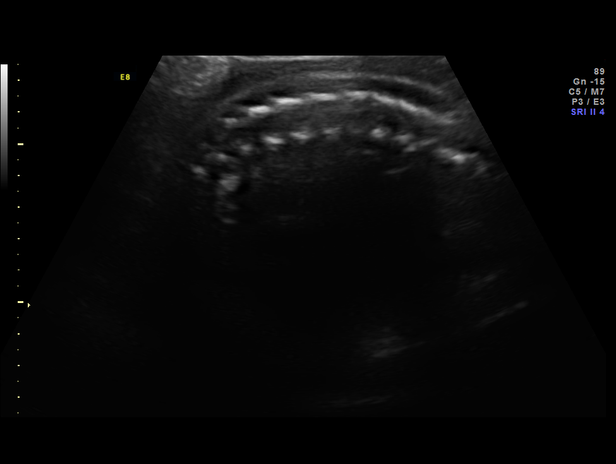
[im 86/101]
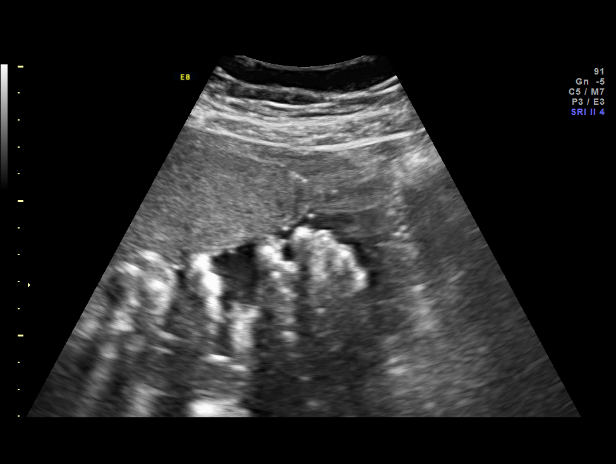
[im 93/101]
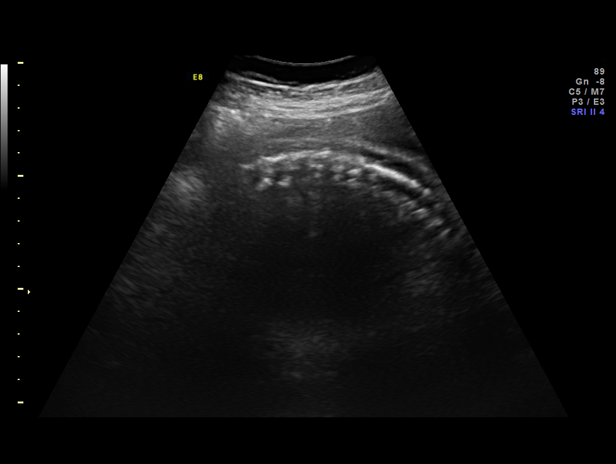
[im 101/101]
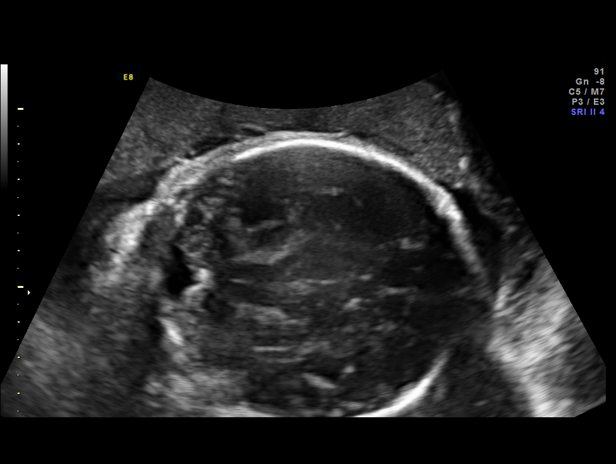

[15 of 28 positions shown; findings below may reference images not displayed]

FINDINGS: 1. Single intrauterine pregnancy.
 2. Estimated fetal weight is in the 24th%. The long bones
 measure 4-5 weeks behind dating.
 3. Anterior placenta without evidence of previa.
 4. Normal amniotic fluid index.
 5. There is a single umbilical artery.
 6. The views of the right outflow, ductal arch, abdominal cord
 insertion, spine, hands, and ankles are limited.
 7. The remainder of the limited anatomy survey is normal.
 8. The bones have a normal shape and echogenicity. No
 fractures or bowing is seen.
Recommendations

 1. Overall appropriate fetal growth.
 2. Limited anatomy survey:
 - discussed with patient the limitations of ultrasound in
 detecting fetal anomalies especially given advanced
 gestational age
 3. Single umbilical artery:
 - I discussed that this finding has a slight association with
 fetal aneuploidy- patient met with the genetic counselor; see
 separate report; had normal aneuploidy risk on first trimester
 screen
 I also discussed the association with fetal anomalies
 including genitourinary and cardiac. No evidence of
 anomalies was seen on today's exam but anatomy survey is
 limited.
 There are mixed findings in the literature regarding
 association with single umbilical artery and fetal growth
 restriction. Discussed overall growth is normal on today's
 exam. Recommend repeat growth in 4 weeks.
 4. Ichaoui Niaz:
 - I discussed the association with fetal aneuploidy- although
 had normal aneuploidy risk on first trimester screen. I also
 discussed the association with increased risk of adverse
 pregnancy outcomes including increased risk of fetal growth
 restriction, preeclampsia, stillbirth, preterm birth, and
 abruption. I discussed these risks are only slightly increased
 over baseline but heightened surveillance is warranted. I
 recommend serial fetal growth ultrasounds.  I recommend
 close surveillance for the development of signs/symptoms of
 preterm labor, PPROM, and preeclampsia
 5. Shortened long bones:
 - patient met with genetic counselor; see separate report
 - discussed that bones measure 4-5 weeks behind and this
 may be constitutional or may be indicative of skeletal
 dysplasia
 - if this is a skeletal dysplasia unlikely to be lethal as long
 bones appear normal, chest appears normal, and long bones
 are only lagging 4-5 weeks
 - discussed will likely not know until delivery and full
 examination of the newborn; recommend inform Pediatrics at
 delivery
 - recommend fetal growth in 3-4 weeks

 questions or concerns.

## 2014-07-28 ENCOUNTER — Ambulatory Visit (HOSPITAL_COMMUNITY): Payer: Self-pay | Admitting: Psychiatry

## 2014-09-11 ENCOUNTER — Emergency Department (HOSPITAL_COMMUNITY)
Admission: EM | Admit: 2014-09-11 | Discharge: 2014-09-11 | Disposition: A | Discharge status: Home / Self Care | Payer: 59 | Attending: Emergency Medicine | Admitting: Emergency Medicine

## 2014-09-11 ENCOUNTER — Encounter (HOSPITAL_COMMUNITY): Payer: Self-pay | Admitting: *Deleted

## 2014-09-11 ENCOUNTER — Emergency Department (HOSPITAL_COMMUNITY): Payer: 59

## 2014-09-11 DIAGNOSIS — Z720 Tobacco use: Secondary | ICD-10-CM | POA: Insufficient documentation

## 2014-09-11 DIAGNOSIS — Z8701 Personal history of pneumonia (recurrent): Secondary | ICD-10-CM | POA: Insufficient documentation

## 2014-09-11 DIAGNOSIS — S62306A Unspecified fracture of fifth metacarpal bone, right hand, initial encounter for closed fracture: Secondary | ICD-10-CM

## 2014-09-11 DIAGNOSIS — Z792 Long term (current) use of antibiotics: Secondary | ICD-10-CM | POA: Insufficient documentation

## 2014-09-11 DIAGNOSIS — J45909 Unspecified asthma, uncomplicated: Secondary | ICD-10-CM | POA: Insufficient documentation

## 2014-09-11 DIAGNOSIS — Y9389 Activity, other specified: Secondary | ICD-10-CM | POA: Insufficient documentation

## 2014-09-11 DIAGNOSIS — Y998 Other external cause status: Secondary | ICD-10-CM | POA: Insufficient documentation

## 2014-09-11 DIAGNOSIS — Y92410 Unspecified street and highway as the place of occurrence of the external cause: Secondary | ICD-10-CM | POA: Insufficient documentation

## 2014-09-11 DIAGNOSIS — W228XXA Striking against or struck by other objects, initial encounter: Secondary | ICD-10-CM | POA: Insufficient documentation

## 2014-09-11 DIAGNOSIS — Z872 Personal history of diseases of the skin and subcutaneous tissue: Secondary | ICD-10-CM | POA: Insufficient documentation

## 2014-09-11 DIAGNOSIS — S62396A Other fracture of fifth metacarpal bone, right hand, initial encounter for closed fracture: Secondary | ICD-10-CM | POA: Insufficient documentation

## 2014-09-11 DIAGNOSIS — Z79899 Other long term (current) drug therapy: Secondary | ICD-10-CM | POA: Insufficient documentation

## 2014-09-11 DIAGNOSIS — F329 Major depressive disorder, single episode, unspecified: Secondary | ICD-10-CM | POA: Insufficient documentation

## 2014-09-11 DIAGNOSIS — Z8742 Personal history of other diseases of the female genital tract: Secondary | ICD-10-CM | POA: Insufficient documentation

## 2014-09-11 MED ORDER — HYDROCODONE-ACETAMINOPHEN 5-325 MG PO TABS: 1.0000 | ORAL_TABLET | Freq: Four times a day (QID) | ORAL | Status: DC | PRN

## 2014-09-11 NOTE — ED Notes (Signed)
ortho tech called to apply splint to RT arm

## 2014-09-11 NOTE — ED Provider Notes (Signed)
CSN: [id number]     Arrival date & time 09/11/14  4098 History  This chart was scribed for non-physician practitioner, Fayrene Helper, PA-C, working with Lyndal Pulley, MD, by Ronney Lion, ED Scribe. This patient was seen in room TR06C/TR06C and the patient's care was started at 9:09 AM.    Chief Complaint  Patient presents with  . Hand Injury   The history is provided by the patient. No language interpreter was used.    HPI Comments: [name] [name] is a 29 y.o. female who is right-hand-dominant presents to the Emergency Department complaining of constant, worsening, stabbing right hand pain radiating up her right wrist, and constant, worsening swelling after punching a wall when she was upset 3 days ago. She also complains of associated numbness in her fifth and fourth fingers. She denies any other injuries. Patient states she has not yet been medically evaluated to this. She states she has tried taking ibuprofen and applying ice to the affected area, with minimal relief.    Past Medical History  Diagnosis Date  . Asthma   . Pregnant state, incidental   . Breast abscess   . Family history of anesthesia complication     Grandmother has difficulty waskin up.  . Depression     HX of - not on med n, doing well.  . Eczema   . Admission for sterilization 06/12/2013  . SVD (spontaneous vaginal delivery)     x 2  . Pneumonia     HX only -years ago has had a few times.  . S/P tubal ligation 06/13/2013   Past Surgical History  Procedure Laterality Date  . Incise and drain abcess      left breast x 2  . Incision and drainage abscess Left 02/17/2012    Procedure:  DRAINAGEof recurrent breast abscess;  Surgeon: Shelly Rubenstein, MD;  Location: [facility] OR;  Service: General;  Laterality: Left;  . Laparoscopic tubal ligation Bilateral 06/13/2013    Procedure: LAPAROSCOPIC TUBAL LIGATION;  Surgeon: Sherian Rein, MD;  Location: [facility];  Service: Gynecology;  Laterality: Bilateral;   Family  History  Problem Relation Age of Onset  . Healthy Mother   . Anxiety disorder Mother   . Healthy Father   . Stroke Maternal Grandmother   . Stroke Paternal Grandfather   . Bipolar disorder Neg Hx   . Depression Neg Hx    Social History  Substance Use Topics  . Smoking status: Current Every Day Smoker -- 1.00 packs/day for 5 years    Types: Cigarettes    Last Attempt to Quit: 06/22/2010  . Smokeless tobacco: Never Used  . Alcohol Use: Yes     Comment: daily up until mid December 2015. Since then it is down to 1 beer a week   OB History    Gravida Para Term Preterm AB TAB SAB Ectopic Multiple Living   2 2 1 1      2      Review of Systems  Musculoskeletal: Positive for myalgias (right hand pain) and arthralgias (right hand pain).  Neurological: Positive for numbness (along right fourth and fifth fingers).    Allergies  Doxycycline  Home Medications   Prior to Admission medications   Medication Sig Start Date End Date Taking? Authorizing Provider  cefdinir (OMNICEF) 300 MG capsule Take 1 capsule (300 mg total) by mouth 2 (two) times daily. 05/01/14   Rodolph Bong, MD  FLUoxetine (PROZAC) 20 MG capsule Take 1 capsule (20 mg total) by  mouth daily. 05/28/14   Oletta Darter, MD  hydrOXYzine (ATARAX/VISTARIL) 25 MG tablet Take 25 mg by mouth 2 (two) times daily as needed for itching.    Historical Provider, MD  ibuprofen (ADVIL,MOTRIN) 800 MG tablet Take 800 mg by mouth every 8 (eight) hours as needed for moderate pain.    Historical Provider, MD  loratadine (CLARITIN) 10 MG tablet TAKE 1 TABLET BY MOUTH EVERY DAY 11/20/13   Carney Living, MD  montelukast (SINGULAIR) 10 MG tablet Take 1 tablet (10 mg total) by mouth at bedtime. 09/11/13   Carney Living, MD  PROAIR HFA 108 (90 BASE) MCG/ACT inhaler INHALE 2 PUFFS BY MOUTH EVERY 2 HOURS AS NEEDED FOR SHORTNESS OF BREATH 10/21/13   Carney Living, MD  QUEtiapine (SEROQUEL) 50 MG tablet Take 1 tablet (50 mg total) by  mouth at bedtime. 05/28/14 05/28/15  Oletta Darter, MD  triamcinolone cream (KENALOG) 0.5 % Apply 1 application topically 3 (three) times daily.    Historical Provider, MD   BP 126/77 mmHg  Pulse 116  Temp(Src) 98 F (36.7 C) (Oral)  Resp 16  Ht 5' (1.524 m)  Wt 165 lb (74.844 kg)  BMI 32.22 kg/m2  SpO2 98%  LMP 09/04/2014 Physical Exam  Constitutional: She is oriented to person, place, and time. She appears well-developed and well-nourished. No distress.  HENT:  Head: Normocephalic and atraumatic.  Eyes: Conjunctivae and EOM are normal.  Neck: Neck supple. No tracheal deviation present.  Cardiovascular: Normal rate.   Pulmonary/Chest: Effort normal. No respiratory distress.  Musculoskeletal: She exhibits edema and tenderness.  Right hand: Dorsum of hand is moderately edematous and TTP, most significantly along the third, fourth, and fifth carpal metacarpals, without any crepitus. Right ring finger is held in a mild flexed position, as approximately 30 degrees. Decreased flexion and extension to the fourth and fifth finger, with active resistance. Brisk cap refill throughout fingers. Small abrasion noted to the dorsum of hand, between the fourth and fifth fingers.   Neurological: She is alert and oriented to person, place, and time.  Skin: Skin is warm and dry.  Psychiatric: She has a normal mood and affect. Her behavior is normal.  Nursing note and vitals reviewed.   ED Course  Procedures (including critical care time)  DIAGNOSTIC STUDIES: Oxygen Saturation is 98% on RA, normal by my interpretation.    COORDINATION OF CARE: 9:20 AM - Discussed treatment plan with pt at bedside which includes right hand XR. Pt verbalized understanding and agreed to plan.   10:29 AM Injury to right hand. Although patient complaining of numbness sensation to her fourth and fifth finger, compartment is soft and I have low suspicion for compartment syndrome. No severe pain with passive range of  motion. Normal brisk cap refill.  Xray of R hand with evidence of acute fx distal diaphysis R fifth metacarpal.  Minimal angulation.  This is a closed injury.  No pain at anatomical snuff box concerning for scaphoid fx.  Pt will be placed in an ulnar gutter splint and f/u with hand specialist for further care.  Pain medication prescribed.  Return precaution discussed.  Pt is NVI post splint placement.   Imaging Review Dg Hand Complete Right  09/11/2014   CLINICAL DATA:  The patient punched a wall 09/08/2014. Right hand pain, bruising and swelling. Initial encounter.  EXAM: RIGHT HAND - COMPLETE 3+ VIEW  COMPARISON:  Plain films the right hand 09/14/2011.  FINDINGS: The patient has an acute fracture through  the distal diaphysis of the fifth metacarpal with mild volar angulation. Associated soft tissue swelling is noted. No other bony or joint abnormality is identified.  IMPRESSION: Acute fracture distal diaphysis right fifth metacarpal.   Electronically Signed   By: Drusilla Kanner [name]   On: 09/11/2014 10:21   I have personally reviewed and evaluated these images and lab results as part of my medical decision-making.  MDM   Final diagnoses:  Closed fracture of fifth metacarpal bone of right hand, initial encounter   BP 117/74 mmHg  Pulse 82  Temp(Src) 98.2 F (36.8 C) (Oral)  Resp 16  Ht 5' (1.524 m)  Wt 165 lb (74.844 kg)  BMI 32.22 kg/m2  SpO2 97%  LMP 09/04/2014  Breastfeeding? No    I personally performed the services described in this documentation, which was scribed in my presence. The recorded information has been reviewed and is accurate.      Fayrene Helper, PA-C 09/11/14 1051  Lyndal Pulley, MD 09/12/14 [phone]

## 2014-09-11 NOTE — ED Notes (Signed)
Declined W/C at D/C and was escorted to lobby by RN. 

## 2014-09-11 NOTE — Discharge Instructions (Signed)
Keep hand elevated, follow up with hand specialist for further management.  Take pain medication as needed but avoid driving or operate heavy machinery as the medication can cause drowsiness  Boxer's Fracture Boxer's fracture is a broken bone (fracture) of the fourth or fifth metacarpal (ring or pinky finger). The metacarpal bones connect the wrist to the fingers and make up the arch of the hand. Boxer's fracture occurs toward the body (proximal) from the first knuckle. This injury is known as a boxer's fracture, because it often occurs from hitting an object with a closed fist. SYMPTOMS   Severe pain at the time of injury.  Pain and swelling around the first knuckle on the fourth or fifth finger.  Bruising (contusion) in the area within 48 hours of injury.  Visible deformity, such as a pushed down knuckle. This can occur if the fracture is complete, and the bone fragments separate enough to distort normal body shape.  Numbness or paralysis from swelling in the hand, causing pressure on the blood vessels or nerves (uncommon). CAUSES   Direct injury (trauma), such as a striking blow with the fist.  Indirect stress to the hand, such as twisting or violent muscle contraction (uncommon). RISK INCREASES WITH:  Punching an object with an unprotected knuckle.  Contact sports (football, rugby).  Sports that require hitting (boxing, martial arts).  History of bone or joint disease (osteoporosis). PREVENTION  Maintain physical fitness:  Muscle strength and flexibility.  Endurance.  Cardiovascular fitness.  For participation in contact sports, wear proper protective equipment for the hand and make sure it fits properly.  Learn and use proper technique when hitting or punching. PROGNOSIS  When proper treatment is given, to ensure normal alignment of the bones, healing can usually be expected in 4 to 6 weeks. Occasionally, surgery is necessary.  RELATED COMPLICATIONS   Bone does not  heal back together (nonunion).  Bone heals together in an improper position (malunion), causing twisting of the finger when making a fist.  Chronic pain, stiffness, or swelling of the hand.  Excessive bleeding in the hand, causing pressure and injury to nerves and blood vessels (rare).  Stopping of normal hand growth in children.  Infection of the wound, if skin is broken over the fracture (open fracture), or at the incision site if surgery is performed.  Shortening of injured bones.  Bony bump (prominence) in the palm or loss of shape of the knuckles.  Pain and weakness when gripping.  Arthritis of the affected joint, if the fracture goes into the joint, after repeated injury, or after delayed treatment.  Scarring around the knuckle, and limited motion. TREATMENT  Treatment varies, depending on the injury. The place in the hand where the injury occurs has a great deal of motion, which allows the hand to move properly. If the fracture is not aligned properly, this function may be decreased. If the bone ends are in proper alignment, treatment first involves ice and elevation of the injured hand, at or above heart level, to reduce inflammation. Pain medicines help to relieve pain. Treatment also involves restraint by splinting, bandaging, casting, or bracing for 4 or more weeks.  If the fracture is out of alignment (displaced), or it involves the joint, surgery is usually recommended. Surgery typically involves cutting through the skin to place removable pins, screws, and sometimes plates over the fracture. After surgery, the bone and joint are restrained for 4 or more weeks. After restraint (with or without surgery), stretching and strengthening exercises are  needed to regain proper strength and function of the joint. Exercises may be done at home or with the assistance of a therapist. Depending on the sport and position played, a brace or splint may be recommended when first returning to  sports.  MEDICATION   If pain medication is necessary, nonsteroidal anti-inflammatory medications, such as aspirin and ibuprofen, or other minor pain relievers, such as acetaminophen, are often recommended.  Do not take pain medication for 7 days before surgery.  Prescription pain relievers may be necessary. Use only as directed and only as much as you need. COLD THERAPY Cold treatment (icing) relieves pain and reduces inflammation. Cold treatment should be applied for 10 to 15 minutes every 2 to 3 hours for inflammation and pain, and immediately after any activity that aggravates your symptoms. Use ice packs or an ice massage. SEEK MEDICAL CARE IF:   Pain, tenderness, or swelling gets worse, despite treatment.  You experience pain, numbness, or coldness in the hand.  Blue, gray, or dark color appears in the fingernails.  You develop signs of infection, after surgery (fever, increased pain, swelling, redness, drainage of fluids, or bleeding in the surgical area).  You feel you have reinjured the hand.  New, unexplained symptoms develop. (Drugs used in treatment may produce side effects.) Document Released: 12/19/2004 Document Revised: 03/13/2011 Document Reviewed: 04/02/2008 [facility] Patient Information 2015 [place], [place]. This information is not intended to replace advice given to you by your health care provider. Make sure you discuss any questions you have with your health care provider.
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
## 2014-09-11 NOTE — Progress Notes (Signed)
Orthopedic Tech Progress Note Patient Details:  [name] [name] [birth year] [id number]  Ortho Devices Type of Ortho Device: Ace wrap, Ulna gutter splint Ortho Device/Splint Location: rue Ortho Device/Splint Interventions: Application   Alvan Culpepper 09/11/2014, 11:00 AM

## 2014-09-15 ENCOUNTER — Encounter (HOSPITAL_COMMUNITY): Payer: Self-pay | Admitting: Emergency Medicine

## 2014-09-15 ENCOUNTER — Emergency Department (HOSPITAL_COMMUNITY)
Admission: EM | Admit: 2014-09-15 | Discharge: 2014-09-15 | Disposition: A | Discharge status: Home / Self Care | Payer: 59 | Attending: Emergency Medicine | Admitting: Emergency Medicine

## 2014-09-15 DIAGNOSIS — Z79899 Other long term (current) drug therapy: Secondary | ICD-10-CM | POA: Insufficient documentation

## 2014-09-15 DIAGNOSIS — J45909 Unspecified asthma, uncomplicated: Secondary | ICD-10-CM | POA: Insufficient documentation

## 2014-09-15 DIAGNOSIS — F329 Major depressive disorder, single episode, unspecified: Secondary | ICD-10-CM | POA: Insufficient documentation

## 2014-09-15 DIAGNOSIS — S62309D Unspecified fracture of unspecified metacarpal bone, subsequent encounter for fracture with routine healing: Secondary | ICD-10-CM

## 2014-09-15 DIAGNOSIS — Z872 Personal history of diseases of the skin and subcutaneous tissue: Secondary | ICD-10-CM | POA: Insufficient documentation

## 2014-09-15 DIAGNOSIS — Z87448 Personal history of other diseases of urinary system: Secondary | ICD-10-CM | POA: Insufficient documentation

## 2014-09-15 DIAGNOSIS — W228XXD Striking against or struck by other objects, subsequent encounter: Secondary | ICD-10-CM | POA: Insufficient documentation

## 2014-09-15 DIAGNOSIS — Z720 Tobacco use: Secondary | ICD-10-CM | POA: Insufficient documentation

## 2014-09-15 DIAGNOSIS — Z8701 Personal history of pneumonia (recurrent): Secondary | ICD-10-CM | POA: Insufficient documentation

## 2014-09-15 DIAGNOSIS — S62396D Other fracture of fifth metacarpal bone, right hand, subsequent encounter for fracture with routine healing: Secondary | ICD-10-CM | POA: Insufficient documentation

## 2014-09-15 MED ORDER — HYDROCODONE-ACETAMINOPHEN 5-325 MG PO TABS: 1.0000 | ORAL_TABLET | Freq: Four times a day (QID) | ORAL | Status: DC | PRN
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
## 2014-09-15 NOTE — Progress Notes (Signed)
Orthopedic Tech Progress Note Patient Details:  [name] [name] Aug 23, 1985 [id number]  Casting Type of Cast: Short arm cast Cast Location: RUE Cast Material: Fiberglass Cast Intervention: Application     Jennye Moccasin 09/15/2014, 5:28 PM
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
## 2014-09-15 NOTE — ED Notes (Signed)
Ortho tech at bedside 

## 2014-09-15 NOTE — ED Provider Notes (Signed)
CSN: [id number]     Arrival date & time 09/15/14  1529 History  This chart was scribed for non-physician practitioner, Langston Masker, PA-C, working with Arby Barrette, MD, by Ronney Lion, ED Scribe. This patient was seen in room TR07C/TR07C and the patient's care was started at 4:39 PM.    Chief Complaint  Patient presents with  . Arm Pain   The history is provided by the patient. No language interpreter was used.    HPI Comments: [name] [name] is a 29 y.o. female who presents to the Emergency Department complaining of constant, stabbing right hand pain and swelling S/P a Boxer's fracture that occurred 7 days ago, after punching a wall while upset. Patient was seen here at the ED and evaluated for this 4 days ago, where she was referred to Dr. Janee Morn, who quoted her $250 for an office visit, which she is not comfortable to pay. She states she was supposed to be referred to Dr. Izora Ribas rather than Dr. Janee Morn, but there was a mix-up due to shift change times. Patient states her insurance does not take effect until 10/03/2014.   Past Medical History  Diagnosis Date  . Asthma   . Pregnant state, incidental   . Breast abscess   . Family history of anesthesia complication     Grandmother has difficulty waskin up.  . Depression     HX of - not on med n, doing well.  . Eczema   . Admission for sterilization 06/12/2013  . SVD (spontaneous vaginal delivery)     x 2  . Pneumonia     HX only -years ago has had a few times.  . S/P tubal ligation 06/13/2013   Past Surgical History  Procedure Laterality Date  . Incise and drain abcess      left breast x 2  . Incision and drainage abscess Left 02/17/2012    Procedure:  DRAINAGEof recurrent breast abscess;  Surgeon: Shelly Rubenstein, MD;  Location: [facility] OR;  Service: General;  Laterality: Left;  . Laparoscopic tubal ligation Bilateral 06/13/2013    Procedure: LAPAROSCOPIC TUBAL LIGATION;  Surgeon: Sherian Rein, MD;  Location: [facility];   Service: Gynecology;  Laterality: Bilateral;   Family History  Problem Relation Age of Onset  . Healthy Mother   . Anxiety disorder Mother   . Healthy Father   . Stroke Maternal Grandmother   . Stroke Paternal Grandfather   . Bipolar disorder Neg Hx   . Depression Neg Hx    Social History  Substance Use Topics  . Smoking status: Current Every Day Smoker -- 1.00 packs/day for 5 years    Types: Cigarettes    Last Attempt to Quit: 06/22/2010  . Smokeless tobacco: Never Used  . Alcohol Use: Yes     Comment: daily up until mid December 2015. Since then it is down to 1 beer a week   OB History    Gravida Para Term Preterm AB TAB SAB Ectopic Multiple Living   Review of Systems  Musculoskeletal: Positive for joint swelling.  All other systems reviewed and are negative. A complete 10 system review of systems was obtained and all systems are negative except as noted in the HPI and PMH.    Allergies  Doxycycline  Home Medications   Prior to Admission medications   Medication Sig Start Date End Date Taking? Authorizing Provider  cefdinir (OMNICEF) 300 MG capsule Take  1 capsule (300 mg total) by mouth 2 (two) times daily. 05/01/14   Rodolph Bong, MD  FLUoxetine (PROZAC) 20 MG capsule Take 1 capsule (20 mg total) by mouth daily. 05/28/14   Oletta Darter, MD  HYDROcodone-acetaminophen (NORCO/VICODIN) 5-325 MG per tablet Take 1 tablet by mouth every 6 (six) hours as needed for moderate pain. 09/11/14   Fayrene Helper, PA-C  hydrOXYzine (ATARAX/VISTARIL) 25 MG tablet Take 25 mg by mouth 2 (two) times daily as needed for itching.    Historical Provider, MD  ibuprofen (ADVIL,MOTRIN) 800 MG tablet Take 800 mg by mouth every 8 (eight) hours as needed for moderate pain.    Historical Provider, MD  loratadine (CLARITIN) 10 MG tablet TAKE 1 TABLET BY MOUTH EVERY DAY 11/20/13   Carney Living, MD  montelukast (SINGULAIR) 10 MG tablet Take 1 tablet (10 mg total) by mouth at  bedtime. 09/11/13   Carney Living, MD  PROAIR HFA 108 (90 BASE) MCG/ACT inhaler INHALE 2 PUFFS BY MOUTH EVERY 2 HOURS AS NEEDED FOR SHORTNESS OF BREATH 10/21/13   Carney Living, MD  QUEtiapine (SEROQUEL) 50 MG tablet Take 1 tablet (50 mg total) by mouth at bedtime. 05/28/14 05/28/15  Oletta Darter, MD  triamcinolone cream (KENALOG) 0.5 % Apply 1 application topically 3 (three) times daily.    Historical Provider, MD   BP 132/73 mmHg  Pulse 95  Temp(Src) 98.3 F (36.8 C) (Oral)  Resp 18  SpO2 98%  LMP 09/04/2014 Physical Exam  Constitutional: She is oriented to person, place, and time. She appears well-developed and well-nourished. No distress.  HENT:  Head: Normocephalic and atraumatic.  Eyes: Conjunctivae and EOM are normal.  Neck: Neck supple. No tracheal deviation present.  Cardiovascular: Normal rate.   Pulmonary/Chest: Effort normal. No respiratory distress.  Musculoskeletal: Normal range of motion. She exhibits tenderness.  Slight bruising over fifth metacarpal area. Slight swelling. Some discoloration. Neurovascular and neurosensory intact.   Neurological: She is alert and oriented to person, place, and time.  Skin: Skin is warm and dry.  Psychiatric: She has a normal mood and affect. Her behavior is normal.  Nursing note and vitals reviewed.   ED Course  Procedures (including critical care time)  DIAGNOSTIC STUDIES: Oxygen Saturation is 98% on RA, normal by my interpretation.    COORDINATION OF CARE: 4:45 PM - Discussed treatment plan with pt at bedside which includes contact with social worker, who can assist patient in establishing f/u care. Will also re-splint pt's hand. Pt verbalized understanding and agreed to plan.   MDM   Final diagnoses:  Metacarpal bone fracture, with routine healing, subsequent encounter   Pt placed in a cast by ortho tech Case manager Burna Mortimer in to see pt Referrals made.  I personally performed the services in this  documentation, which was scribed in my presence.  The recorded information has been reviewed and considered.   Barnet Pall.   Elson Areas, PA-C 09/15/14 1751  Lonia Skinner Dixie, PA-C 09/15/14 1752  Arby Barrette, MD 09/16/14 [phone]

## 2014-09-15 NOTE — Discharge Instructions (Signed)

## 2014-10-15 ENCOUNTER — Encounter (HOSPITAL_COMMUNITY): Payer: Self-pay | Admitting: *Deleted

## 2014-10-15 ENCOUNTER — Emergency Department (HOSPITAL_COMMUNITY)
Admission: EM | Admit: 2014-10-15 | Discharge: 2014-10-15 | Disposition: A | Discharge status: Home / Self Care | Payer: 59 | Attending: Emergency Medicine | Admitting: Emergency Medicine

## 2014-10-15 DIAGNOSIS — J45901 Unspecified asthma with (acute) exacerbation: Secondary | ICD-10-CM | POA: Insufficient documentation

## 2014-10-15 DIAGNOSIS — Z79899 Other long term (current) drug therapy: Secondary | ICD-10-CM | POA: Insufficient documentation

## 2014-10-15 DIAGNOSIS — H109 Unspecified conjunctivitis: Secondary | ICD-10-CM

## 2014-10-15 DIAGNOSIS — F329 Major depressive disorder, single episode, unspecified: Secondary | ICD-10-CM | POA: Insufficient documentation

## 2014-10-15 DIAGNOSIS — Z7952 Long term (current) use of systemic steroids: Secondary | ICD-10-CM | POA: Insufficient documentation

## 2014-10-15 DIAGNOSIS — Z8742 Personal history of other diseases of the female genital tract: Secondary | ICD-10-CM | POA: Insufficient documentation

## 2014-10-15 DIAGNOSIS — Z8701 Personal history of pneumonia (recurrent): Secondary | ICD-10-CM | POA: Insufficient documentation

## 2014-10-15 DIAGNOSIS — Z872 Personal history of diseases of the skin and subcutaneous tissue: Secondary | ICD-10-CM | POA: Insufficient documentation

## 2014-10-15 DIAGNOSIS — J069 Acute upper respiratory infection, unspecified: Secondary | ICD-10-CM | POA: Insufficient documentation

## 2014-10-15 DIAGNOSIS — Z720 Tobacco use: Secondary | ICD-10-CM | POA: Insufficient documentation

## 2014-10-15 MED ORDER — POLYMYXIN B-TRIMETHOPRIM 10000-0.1 UNIT/ML-% OP SOLN
2.0000 [drp] | Freq: Once | OPHTHALMIC | Status: AC
  Administered 2014-10-15: 2 [drp] via OPHTHALMIC
  Filled 2014-10-15: qty 10

## 2014-10-15 MED ORDER — PREDNISONE 50 MG PO TABS: ORAL_TABLET | ORAL | Status: DC

## 2014-10-15 MED ORDER — TETRACAINE HCL 0.5 % OP SOLN
2.0000 [drp] | Freq: Once | OPHTHALMIC | Status: DC
  Filled 2014-10-15: qty 2

## 2014-10-15 MED ORDER — ALBUTEROL SULFATE HFA 108 (90 BASE) MCG/ACT IN AERS
2.0000 | INHALATION_SPRAY | Freq: Once | RESPIRATORY_TRACT | Status: AC
  Administered 2014-10-15: 2 via RESPIRATORY_TRACT
  Filled 2014-10-15: qty 6.7

## 2014-10-15 MED ORDER — FLUORESCEIN SODIUM 1 MG OP STRP
1.0000 | ORAL_STRIP | Freq: Once | OPHTHALMIC | Status: DC
  Filled 2014-10-15: qty 1

## 2014-10-15 MED ORDER — POLYMYXIN B-TRIMETHOPRIM 10000-0.1 UNIT/ML-% OP SOLN: 1.0000 [drp] | OPHTHALMIC | Status: DC

## 2014-10-15 MED ORDER — ALBUTEROL SULFATE (2.5 MG/3ML) 0.083% IN NEBU: 2.5000 mg | INHALATION_SOLUTION | RESPIRATORY_TRACT | Status: DC | PRN

## 2014-10-15 MED ORDER — PREDNISONE 20 MG PO TABS
60.0000 mg | ORAL_TABLET | Freq: Once | ORAL | Status: AC
  Administered 2014-10-15: 60 mg via ORAL
  Filled 2014-10-15: qty 3

## 2014-10-15 NOTE — ED Notes (Signed)
PT reports drainage from RT eye and swelling to eye.

## 2014-10-15 NOTE — Discharge Instructions (Signed)
Please follow with your primary care doctor in the next 2 days for a check-up. They must obtain records for further management.  ° °Do not hesitate to return to the Emergency Department for any new, worsening or concerning symptoms.  ° °

## 2014-10-15 NOTE — ED Notes (Signed)
Declined W/C at D/C and was escorted to lobby by RN. 

## 2014-10-15 NOTE — ED Provider Notes (Signed)
CSN: [id number]     Arrival date & time 10/15/14  7425 History  By signing my name below, I, [name], attest that this documentation has been prepared under the direction and in the presence of Wynetta Emery, PA-C Electronically Signed: [name], ED Scribe. 10/15/2014. 9:42 AM.    Chief Complaint  Patient presents with  . Eye Drainage   The history is provided by the patient. No language interpreter was used.    HPI Comments: [name] [name] is a 29 y.o. female with a h/o asthma who presents to the Emergency Department complaining of right eye drainage onset 2 days. Pt reports associated right eye itching, burning pain in her right eye, intermittent, cough, chest tightness, sore throat, and rhinorrhea. She notes the she has crusted drainage in the morning. She has not had any meds PTA. She does not wear contact lenses. She states that the symptoms are beginning to spread to her left eye.Pt endorses her asthma is usually exacerbated this time of year. Pt reports she is out of albuterol nebulizer and is asking for a refill of that medication because she has not heard back from her PCP at this time She denies any fever or chest pain.    Past Medical History  Diagnosis Date  . Asthma   . Pregnant state, incidental   . Breast abscess   . Family history of anesthesia complication     Grandmother has difficulty waskin up.  . Depression     HX of - not on med n, doing well.  . Eczema   . Admission for sterilization 06/12/2013  . SVD (spontaneous vaginal delivery)     x 2  . Pneumonia     HX only -years ago has had a few times.  . S/P tubal ligation 06/13/2013   Past Surgical History  Procedure Laterality Date  . Incise and drain abcess      left breast x 2  . Incision and drainage abscess Left 02/17/2012    Procedure:  DRAINAGEof recurrent breast abscess;  Surgeon: Shelly Rubenstein, MD;  Location: [facility] OR;  Service: General;  Laterality: Left;  . Laparoscopic tubal  ligation Bilateral 06/13/2013    Procedure: LAPAROSCOPIC TUBAL LIGATION;  Surgeon: Sherian Rein, MD;  Location: [facility];  Service: Gynecology;  Laterality: Bilateral;   Family History  Problem Relation Age of Onset  . Healthy Mother   . Anxiety disorder Mother   . Healthy Father   . Stroke Maternal Grandmother   . Stroke Paternal Grandfather   . Bipolar disorder Neg Hx   . Depression Neg Hx    Social History  Substance Use Topics  . Smoking status: Current Every Day Smoker -- 1.00 packs/day for 5 years    Types: Cigarettes    Last Attempt to Quit: 06/22/2010  . Smokeless tobacco: Never Used  . Alcohol Use: Yes     Comment: daily up until mid December 2015. Since then it is down to 1 beer a week   OB History    Gravida Para Term Preterm AB TAB SAB Ectopic Multiple Living   Review of Systems 10 Systems reviewed and all are negative for acute change except as noted in the HPI.     Allergies  Doxycycline  Home Medications   Prior to Admission medications   Medication Sig Start Date End Date Taking? Authorizing Provider  cefdinir (OMNICEF) 300 MG capsule Take  1 capsule (300 mg total) by mouth 2 (two) times daily. 05/01/14   Rodolph BongEvan S Corey, MD  FLUoxetine (PROZAC) 20 MG capsule Take 1 capsule (20 mg total) by mouth daily. 05/28/14   [name], MD  HYDROcodone-acetaminophen (NORCO/VICODIN) 5-325 MG per tablet Take 1 tablet by mouth every 6 (six) hours as needed for moderate pain. 09/15/14   [name], PA-C  hydrOXYzine (ATARAX/VISTARIL) 25 MG tablet Take 25 mg by mouth 2 (two) times daily as needed for itching.    Historical Provider, MD  ibuprofen (ADVIL,MOTRIN) 800 MG tablet Take 800 mg by mouth every 8 (eight) hours as needed for moderate pain.    Historical Provider, MD  loratadine (CLARITIN) 10 MG tablet TAKE 1 TABLET BY MOUTH EVERY DAY 11/20/13   [name], MD  montelukast (SINGULAIR) 10 MG tablet Take 1 tablet (10 mg total) by  mouth at bedtime. 09/11/13   [name], MD  PROAIR HFA 108 (90 BASE) MCG/ACT inhaler INHALE 2 PUFFS BY MOUTH EVERY 2 HOURS AS NEEDED FOR SHORTNESS OF BREATH 10/21/13   [name], MD  QUEtiapine (SEROQUEL) 50 MG tablet Take 1 tablet (50 mg total) by mouth at bedtime. 05/28/14 05/28/15  [name], MD  triamcinolone cream (KENALOG) 0.5 % Apply 1 application topically 3 (three) times daily.    Historical Provider, MD   Triage Vitals: BP 116/64 mmHg  Pulse 97  Temp(Src) 98.5 F (36.9 C) (Oral)  Resp 18  SpO2 97%  LMP 09/03/2014  Breastfeeding? No  Physical Exam  Constitutional: She is oriented to person, place, and time. She appears well-developed and well-nourished. No distress.  HENT:  Head: Normocephalic and atraumatic.  Mouth/Throat: Oropharynx is clear and moist.  Eyes: EOM are normal. Pupils are equal, round, and reactive to light.  Right eye with slight  injection, no crusting, pupils equal round reactive to light.  Neck: Neck supple. No tracheal deviation present.  Cardiovascular: Normal rate.   Pulmonary/Chest: Effort normal and breath sounds normal. No respiratory distress. She has no wheezes. She has no rales. She exhibits no tenderness.  Musculoskeletal: Normal range of motion.  Neurological: She is alert and oriented to person, place, and time.  Skin: Skin is warm and dry.  Psychiatric: She has a normal mood and affect. Her behavior is normal.  Nursing note and vitals reviewed.   ED Course  Procedures (including critical care time)  DIAGNOSTIC STUDIES: Oxygen Saturation is 100% on RA, normal by my interpretation.    COORDINATION OF CARE:    Labs Review Labs Reviewed - No data to display  Imaging Review No results found. I have personally reviewed and evaluated these images and lab results as part of my medical decision-making.   EKG Interpretation None      MDM   Final diagnoses:  Conjunctivitis of right eye  URI (upper  respiratory infection)   Filed Vitals:   10/15/14 0903 10/15/14 1010  BP: 116/64 111/68  Pulse: 97 89  Temp: 98.5 F (36.9 C)   TempSrc: Oral   Resp: 18 16  SpO2: 97% 97%    Medications  tetracaine (PONTOCAINE) 0.5 % ophthalmic solution 2 drop (not administered)  fluorescein ophthalmic strip 1 strip (not administered)  trimethoprim-polymyxin b (POLYTRIM) ophthalmic solution 2 drop (not administered)  predniSONE (DELTASONE) tablet 60 mg (60 mg Oral Given 10/15/14 1005)  albuterol (PROVENTIL HFA;VENTOLIN HFA) 108 (90 BASE) MCG/ACT inhaler 2 puff (2 puffs Inhalation Given 10/15/14 1005)    [name] [name] is  a pleasant 29 y.o. female presenting with injection and irritation to right eye, patient states that the eye was crusted when she woke up this morning however there is no crusting on my exam, no significant discharge. Patient is not a contact lens wearer. Patient reports that the URI is setting off her asthma however her lung sounds are clear with no wheezing, there is excellent air movement in all fields. Patient will be given inhaler, started on steroid burst and will refill her nebulizer solution for home use.  Evaluation does not show pathology that would require ongoing emergent intervention or inpatient treatment. Pt is hemodynamically stable and mentating appropriately. Discussed findings and plan with patient/guardian, who agrees with care plan. All questions answered. Return precautions discussed and outpatient follow up given.   New Prescriptions   ALBUTEROL (PROVENTIL) (2.5 MG/3ML) 0.083% NEBULIZER SOLUTION    Take 3 mLs (2.5 mg total) by nebulization every 4 (four) hours as needed for wheezing or shortness of breath.   PREDNISONE (DELTASONE) 50 MG TABLET    Take 1 tablet daily with breakfast   TRIMETHOPRIM-POLYMYXIN B (POLYTRIM) OPHTHALMIC SOLUTION    Place 1 drop into the right eye every 4 (four) hours.     I personally performed the services described in this  documentation, which was scribed in my presence. The recorded information has been reviewed and is accurate.   Wynetta Emery, PA-C 10/15/14 1032  Donnetta Hutching, MD 10/15/14 1146

## 2014-10-22 ENCOUNTER — Emergency Department (HOSPITAL_COMMUNITY)
Admission: EM | Admit: 2014-10-22 | Discharge: 2014-10-22 | Disposition: A | Discharge status: Home / Self Care | Payer: 59 | Attending: Physician Assistant | Admitting: Physician Assistant

## 2014-10-22 ENCOUNTER — Encounter (HOSPITAL_COMMUNITY): Payer: Self-pay | Admitting: *Deleted

## 2014-10-22 DIAGNOSIS — Z792 Long term (current) use of antibiotics: Secondary | ICD-10-CM | POA: Insufficient documentation

## 2014-10-22 DIAGNOSIS — Z79899 Other long term (current) drug therapy: Secondary | ICD-10-CM | POA: Insufficient documentation

## 2014-10-22 DIAGNOSIS — H109 Unspecified conjunctivitis: Secondary | ICD-10-CM | POA: Insufficient documentation

## 2014-10-22 DIAGNOSIS — R0981 Nasal congestion: Secondary | ICD-10-CM | POA: Insufficient documentation

## 2014-10-22 DIAGNOSIS — B309 Viral conjunctivitis, unspecified: Secondary | ICD-10-CM

## 2014-10-22 DIAGNOSIS — Z720 Tobacco use: Secondary | ICD-10-CM | POA: Insufficient documentation

## 2014-10-22 DIAGNOSIS — Z8701 Personal history of pneumonia (recurrent): Secondary | ICD-10-CM | POA: Insufficient documentation

## 2014-10-22 DIAGNOSIS — Z8742 Personal history of other diseases of the female genital tract: Secondary | ICD-10-CM | POA: Insufficient documentation

## 2014-10-22 DIAGNOSIS — Z872 Personal history of diseases of the skin and subcutaneous tissue: Secondary | ICD-10-CM | POA: Insufficient documentation

## 2014-10-22 DIAGNOSIS — J45909 Unspecified asthma, uncomplicated: Secondary | ICD-10-CM | POA: Insufficient documentation

## 2014-10-22 DIAGNOSIS — F329 Major depressive disorder, single episode, unspecified: Secondary | ICD-10-CM | POA: Insufficient documentation

## 2014-10-22 DIAGNOSIS — J3489 Other specified disorders of nose and nasal sinuses: Secondary | ICD-10-CM | POA: Insufficient documentation

## 2014-10-22 DIAGNOSIS — Z7952 Long term (current) use of systemic steroids: Secondary | ICD-10-CM | POA: Insufficient documentation

## 2014-10-22 DIAGNOSIS — R05 Cough: Secondary | ICD-10-CM | POA: Insufficient documentation

## 2014-10-22 NOTE — Discharge Instructions (Signed)
Viral Conjunctivitis Viral conjunctivitis is an inflammation of the clear membrane that covers the white part of your eye and the inner surface of your eyelid (conjunctiva). The inflammation is caused by a viral infection. The blood vessels in the conjunctiva become inflamed, causing the eye to become red or pink, and often itchy. Viral conjunctivitis can easily be passed from one person to another (contagious). CAUSES  Viral conjunctivitis is caused by a virus. A virus is a type of contagious germ. It can be spread by touching objects that have been contaminated with the virus, such as doorknobs or towels.  SYMPTOMS  Symptoms of viral conjunctivitis may include:   Eye redness.  Tearing or watery eyes.  Itchy eyes.  Burning feeling in the eyes.  Clear drainage from the eye.  Swollen eyelids.  A gritty feeling in the eye.  Light sensitivity. DIAGNOSIS  Viral conjunctivitis may be diagnosed with a medical history and physical exam. If you have discharge from your eye, the discharge may be tested to rule out other causes of conjunctivitis.  TREATMENT  Viral conjunctivitis does not respond to medicines that kill bacteria (antibiotics). Treatment for viral conjunctivitis is directed at stopping a bacterial infection from developing in addition to the viral infection. Treatment also aims to relieve your symptoms, such as itching. This may be done with antihistamine drops or other eye medicines. HOME CARE INSTRUCTIONS  Take medicines only as directed by your health care provider.  Avoid touching or rubbing your eyes.  Apply a warm, clean washcloth to your eye for 10-20 minutes, 3-4 times per day.  If you wear contact lenses, do not wear them until the inflammation is gone and your health care provider says it is safe to wear them again. Ask your health care provider how to sterilize or replace your contact lenses before using them again. Wear glasses until you can resume wearing  contacts.  Avoid wearing eye makeup until the inflammation is gone. Throw away any old eye cosmetics that may be contaminated.  Change or wash your pillowcase every day.  Do not share towels or washcloths. This may spread the infection.  Wash your hands often with soap and water. Use paper towels to dry your hands.  Gently wipe away any drainage from your eye with a warm, wet washcloth or a cotton ball.  Be very careful to avoid touching the edge of the eyelid with the eye drop bottle or ointment tube when applying medicines to the affected eye. This will stop you from spreading the infection to the other eye or to other people. SEEK MEDICAL CARE IF:   Your symptoms do not improve with treatment.  You have increased pain.  Your vision becomes blurry.  You have a fever.  You have facial pain, redness, or swelling.  You have new symptoms.  Your symptoms get worse.   This information is not intended to replace advice given to you by your health care provider. Make sure you discuss any questions you have with your health care provider.   Document Released: 03/11/2002 Document Revised: 06/12/2005 Document Reviewed: 09/30/2013 Elsevier Interactive Patient Education 2016 [employer]  

## 2014-10-22 NOTE — ED Notes (Signed)
Declined W/C at D/C and was escorted to lobby by RN. 

## 2014-10-22 NOTE — ED Provider Notes (Signed)
CSN: [id number]     Arrival date & time 10/22/14  4098 History  By signing my name below, I, [name] [name], attest that this documentation has been prepared under the direction and in the presence of Marlon Pel, PA-C Electronically Signed: Jarvis [name], ED Scribe. 10/22/2014. 11:12 AM.    Chief Complaint  Patient presents with  . Eye Problem     The history is provided by the patient. No language interpreter was used.    HPI Comments: [name] [name] is a 29 y.o. female who presents to the Emergency Department complaining of constant, moderate, bilateral eye redness onset 1.5 weeks. She reports associated eye drainage, burning eye pain and eye itching. Pt was seen in the ER 1 week ago, 10/15/14, for the same. Pt was told at that time that she could have viral conjunctivitis but was given Polytrim eye drops to take in case it was bacterial in etiology. She has been using the Polytrim eye drops with no significant relief. She reports her symptoms are typically worse at night but otherwise unchanged. She also states that her URI symptoms from 1 week ago have not completely improved. Pt does not wear contact lenses. She denies any numbness, weakness, fever, or chills.   Past Medical History  Diagnosis Date  . Asthma   . Pregnant state, incidental   . Breast abscess   . Family history of anesthesia complication     Grandmother has difficulty waskin up.  . Depression     HX of - not on med n, doing well.  . Eczema   . Admission for sterilization 06/12/2013  . SVD (spontaneous vaginal delivery)     x 2  . Pneumonia     HX only -years ago has had a few times.  . S/P tubal ligation 06/13/2013   Past Surgical History  Procedure Laterality Date  . Incise and drain abcess      left breast x 2  . Incision and drainage abscess Left 02/17/2012    Procedure:  DRAINAGEof recurrent breast abscess;  Surgeon: Shelly Rubenstein, MD;  Location: [facility] OR;  Service: General;  Laterality: Left;   . Laparoscopic tubal ligation Bilateral 06/13/2013    Procedure: LAPAROSCOPIC TUBAL LIGATION;  Surgeon: Sherian Rein, MD;  Location: [facility];  Service: Gynecology;  Laterality: Bilateral;   Family History  Problem Relation Age of Onset  . Healthy Mother   . Anxiety disorder Mother   . Healthy Father   . Stroke Maternal Grandmother   . Stroke Paternal Grandfather   . Bipolar disorder Neg Hx   . Depression Neg Hx    Social History  Substance Use Topics  . Smoking status: Current Every Day Smoker -- 1.00 packs/day for 5 years    Types: Cigarettes    Last Attempt to Quit: 06/22/2010  . Smokeless tobacco: Never Used  . Alcohol Use: Yes     Comment: daily up until mid December 2015. Since then it is down to 1 beer a week   OB History    Gravida Para Term Preterm AB TAB SAB Ectopic Multiple Living   Review of Systems  Constitutional: Negative for fever and chills.  HENT: Positive for congestion and rhinorrhea.   Eyes: Positive for pain, discharge, redness and itching.  Respiratory: Positive for cough.       Allergies  Doxycycline  Home Medications   Prior to Admission medications  Medication Sig Start Date End Date Taking? Authorizing Provider  albuterol (PROVENTIL) (2.5 MG/3ML) 0.083% nebulizer solution Take 3 mLs (2.5 mg total) by nebulization every 4 (four) hours as needed for wheezing or shortness of breath. 10/15/14   [name], PA-C  cefdinir (OMNICEF) 300 MG capsule Take 1 capsule (300 mg total) by mouth 2 (two) times daily. 05/01/14   Rodolph Bong, MD  FLUoxetine (PROZAC) 20 MG capsule Take 1 capsule (20 mg total) by mouth daily. 05/28/14   Oletta Darter, MD  HYDROcodone-acetaminophen (NORCO/VICODIN) 5-325 MG per tablet Take 1 tablet by mouth every 6 (six) hours as needed for moderate pain. 09/15/14   Elson Areas, PA-C  hydrOXYzine (ATARAX/VISTARIL) 25 MG tablet Take 25 mg by mouth 2 (two) times daily as needed for itching.     Historical Provider, MD  ibuprofen (ADVIL,MOTRIN) 800 MG tablet Take 800 mg by mouth every 8 (eight) hours as needed for moderate pain.    Historical Provider, MD  loratadine (CLARITIN) 10 MG tablet TAKE 1 TABLET BY MOUTH EVERY DAY 11/20/13   Carney Living, MD  montelukast (SINGULAIR) 10 MG tablet Take 1 tablet (10 mg total) by mouth at bedtime. 09/11/13   Carney Living, MD  predniSONE (DELTASONE) 50 MG tablet Take 1 tablet daily with breakfast 10/15/14   Joni Reining Pisciotta, PA-C  QUEtiapine (SEROQUEL) 50 MG tablet Take 1 tablet (50 mg total) by mouth at bedtime. 05/28/14 05/28/15  Oletta Darter, MD  triamcinolone cream (KENALOG) 0.5 % Apply 1 application topically 3 (three) times daily.    Historical Provider, MD  trimethoprim-polymyxin b (POLYTRIM) ophthalmic solution Place 1 drop into the right eye every 4 (four) hours. 10/15/14   [name], PA-C   Triage Vitals: BP 120/78 mmHg  Pulse 101  Temp(Src) 97.2 F (36.2 C) (Oral)  Resp 12  SpO2 94%  LMP 10/03/2014  Physical Exam  Constitutional: She is oriented to person, place, and time. She appears well-developed and well-nourished. No distress.  HENT:  Head: Normocephalic and atraumatic.  Eyes: EOM are normal. Right conjunctiva is injected. Left conjunctiva is injected.  Clear drainage.  Neck: Neck supple. No spinous process tenderness and no muscular tenderness present. No tracheal deviation present.  Cardiovascular: Normal rate.   Pulmonary/Chest: Effort normal. No respiratory distress.  Musculoskeletal: Normal range of motion.  Neurological: She is alert and oriented to person, place, and time.  Skin: Skin is warm and dry.  Psychiatric: She has a normal mood and affect. Her behavior is normal.  Nursing note and vitals reviewed.   ED Course  Procedures (including critical care time)  DIAGNOSTIC STUDIES: Oxygen Saturation is 94% on RA, normal by my interpretation.    COORDINATION OF CARE: 11:09 AM- Will give  referral to eye doctor and to call after leaving and see if they can see her today since she is unable to return to work until better. She has symptoms of viral URI and injected bilateral eyes consistent with viral conjunctivitis made aware that viral conjunctivitis can last from 1-2 weeks and sometimes up to 3 weeks . Pt advised of plan for treatment and pt agrees.  Labs Review Labs Reviewed - No data to display  Imaging Review No results found. .   EKG Interpretation None      MDM   Final diagnoses:  Viral conjunctivitis   Medications - No data to display  28 y.o.Enma Septer's medical screening exam was performed and I feel the patient has had an appropriate workup  for their chief complaint at this time and likelihood of emergent condition existing is low. They have been counseled on decision, discharge, follow up and which symptoms necessitate immediate return to the emergency department. They or their family verbally stated understanding and agreement with plan and discharged in stable condition.   Vital signs are stable at discharge. Filed Vitals:   10/22/14 1111  BP: 116/81  Pulse: 87  Temp: 98.1 F (36.7 C)  Resp: 16     I personally performed the services described in this documentation, which was scribed in my presence. The recorded information has been reviewed and is accurate.    Marlon Peliffany Mehdi Gironda, PA-C 10/22/14 1113  Courteney Lyn Mackuen, MD 10/22/14 1624

## 2014-10-22 NOTE — ED Notes (Signed)
PT reports  Her eye problem has not improved since 10/13/14. Pt still has eye drainage and  Swelling .

## 2014-11-03 ENCOUNTER — Emergency Department (HOSPITAL_COMMUNITY)
Admission: EM | Admit: 2014-11-03 | Discharge: 2014-11-03 | Disposition: A | Discharge status: Home / Self Care | Payer: 59 | Attending: Emergency Medicine | Admitting: Emergency Medicine

## 2014-11-03 ENCOUNTER — Encounter (HOSPITAL_COMMUNITY): Payer: Self-pay | Admitting: *Deleted

## 2014-11-03 ENCOUNTER — Emergency Department (HOSPITAL_COMMUNITY): Payer: 59

## 2014-11-03 DIAGNOSIS — R0602 Shortness of breath: Secondary | ICD-10-CM | POA: Diagnosis present

## 2014-11-03 DIAGNOSIS — J45901 Unspecified asthma with (acute) exacerbation: Secondary | ICD-10-CM

## 2014-11-03 DIAGNOSIS — Z872 Personal history of diseases of the skin and subcutaneous tissue: Secondary | ICD-10-CM | POA: Diagnosis not present

## 2014-11-03 DIAGNOSIS — Z720 Tobacco use: Secondary | ICD-10-CM | POA: Insufficient documentation

## 2014-11-03 DIAGNOSIS — Z8659 Personal history of other mental and behavioral disorders: Secondary | ICD-10-CM | POA: Diagnosis not present

## 2014-11-03 DIAGNOSIS — R000 Tachycardia, unspecified: Secondary | ICD-10-CM | POA: Diagnosis not present

## 2014-11-03 DIAGNOSIS — Z8701 Personal history of pneumonia (recurrent): Secondary | ICD-10-CM | POA: Insufficient documentation

## 2014-11-03 DIAGNOSIS — Z8742 Personal history of other diseases of the female genital tract: Secondary | ICD-10-CM | POA: Insufficient documentation

## 2014-11-03 LAB — BASIC METABOLIC PANEL WITH GFR
Anion gap: 9 (ref 5–15)
BUN: 6 mg/dL (ref 6–20)
CO2: 24 mmol/L (ref 22–32)
Calcium: 9 mg/dL (ref 8.9–10.3)
Chloride: 108 mmol/L (ref 101–111)
Creatinine, Ser: 0.82 mg/dL (ref 0.44–1.00)
GFR calc Af Amer: >60 mL/min
GFR calc non Af Amer: >60 mL/min
Glucose, Bld: 120 mg/dL — ABNORMAL HIGH (ref 65–99)
Potassium: 3.6 mmol/L (ref 3.5–5.1)
Sodium: 141 mmol/L (ref 135–145)

## 2014-11-03 LAB — CBC WITH DIFFERENTIAL/PLATELET
Basophils Absolute: 0 K/uL (ref 0.0–0.1)
Basophils Relative: 0 %
Eosinophils Absolute: 0.1 K/uL (ref 0.0–0.7)
Eosinophils Relative: 1 %
HCT: 44.3 % (ref 36.0–46.0)
Hemoglobin: 14.6 g/dL (ref 12.0–15.0)
Lymphocytes Relative: 21 %
Lymphs Abs: 2 K/uL (ref 0.7–4.0)
MCH: 32.7 pg (ref 26.0–34.0)
MCHC: 33 g/dL (ref 30.0–36.0)
MCV: 99.1 fL (ref 78.0–100.0)
Monocytes Absolute: 0.6 K/uL (ref 0.1–1.0)
Monocytes Relative: 6 %
Neutro Abs: 6.8 K/uL (ref 1.7–7.7)
Neutrophils Relative %: 72 %
Platelets: 385 K/uL (ref 150–400)
RBC: 4.47 MIL/uL (ref 3.87–5.11)
RDW: 13.1 % (ref 11.5–15.5)
WBC: 9.6 K/uL (ref 4.0–10.5)

## 2014-11-03 MED ORDER — IPRATROPIUM-ALBUTEROL 0.5-2.5 (3) MG/3ML IN SOLN
3.0000 mL | Freq: Once | RESPIRATORY_TRACT | Status: AC
  Administered 2014-11-03: 3 mL via RESPIRATORY_TRACT
  Filled 2014-11-03: qty 3

## 2014-11-03 MED ORDER — ALBUTEROL SULFATE HFA 108 (90 BASE) MCG/ACT IN AERS
1.0000 | INHALATION_SPRAY | Freq: Once | RESPIRATORY_TRACT | Status: AC
  Administered 2014-11-03: 1 via RESPIRATORY_TRACT
  Filled 2014-11-03: qty 6.7

## 2014-11-03 MED ORDER — DEXAMETHASONE SODIUM PHOSPHATE 10 MG/ML IJ SOLN
10.0000 mg | Freq: Once | INTRAMUSCULAR | Status: AC
  Administered 2014-11-03: 10 mg via INTRAVENOUS
  Filled 2014-11-03: qty 1

## 2014-11-03 MED ORDER — MAGNESIUM SULFATE 2 GM/50ML IV SOLN
2.0000 g | Freq: Once | INTRAVENOUS | Status: AC
  Administered 2014-11-03: 2 g via INTRAVENOUS
  Filled 2014-11-03: qty 50

## 2014-11-03 MED ORDER — ALBUTEROL SULFATE (2.5 MG/3ML) 0.083% IN NEBU
INHALATION_SOLUTION | RESPIRATORY_TRACT | Status: AC
  Filled 2014-11-03: qty 6

## 2014-11-03 MED ORDER — PREDNISONE 10 MG (21) PO TBPK: 10.0000 mg | ORAL_TABLET | Freq: Every day | ORAL | Status: DC

## 2014-11-03 MED ORDER — ALBUTEROL SULFATE (2.5 MG/3ML) 0.083% IN NEBU
5.0000 mg | INHALATION_SOLUTION | Freq: Once | RESPIRATORY_TRACT | Status: AC
  Administered 2014-11-03: 5 mg via RESPIRATORY_TRACT

## 2014-11-03 MED ORDER — AZITHROMYCIN 250 MG PO TABS: 250.0000 mg | ORAL_TABLET | Freq: Every day | ORAL | Status: DC

## 2014-11-03 MED ORDER — ALBUTEROL SULFATE (2.5 MG/3ML) 0.083% IN NEBU: 2.5000 mg | INHALATION_SOLUTION | Freq: Four times a day (QID) | RESPIRATORY_TRACT | Status: DC | PRN

## 2014-11-03 MED ORDER — SODIUM CHLORIDE 0.9 % IV BOLUS (SEPSIS)
500.0000 mL | Freq: Once | INTRAVENOUS | Status: AC
  Administered 2014-11-03: 500 mL via INTRAVENOUS

## 2014-11-03 MED ORDER — ALBUTEROL (5 MG/ML) CONTINUOUS INHALATION SOLN
10.0000 mg/h | INHALATION_SOLUTION | RESPIRATORY_TRACT | Status: DC
  Administered 2014-11-03: 10 mg/h via RESPIRATORY_TRACT
  Filled 2014-11-03: qty 20

## 2014-11-03 MED ORDER — SODIUM CHLORIDE 0.9 % IV BOLUS (SEPSIS): 500.0000 mL | Freq: Once | INTRAVENOUS | Status: DC

## 2014-11-03 NOTE — ED Notes (Signed)
Pt was recently URI and was given steroids and she has run through a inhaler

## 2014-11-03 NOTE — ED Notes (Signed)
[facility] Pl[facility]

## 2014-11-03 NOTE — ED Notes (Signed)
RT made aware patient is to have a treatment

## 2014-11-03 NOTE — ED Notes (Signed)
Patient ambulated with pulse oximetry. Patient's Oxygen Saturation startes at 98% dropped to as low as 93%. Patient denies any change in SOB while walking. Patient's HR 109-119 during walking.

## 2014-11-03 NOTE — ED Provider Notes (Signed)
CSN: [id number]     Arrival date & time 11/03/14  1144 History   First MD Initiated Contact with Patient 11/03/14 1219     Chief Complaint  Patient presents with  . Asthma  . Cough     (Consider location/radiation/quality/duration/timing/severity/associated sxs/prior Treatment) HPI  [name] [name] is a 29 y.o F with a past medical history of severe asthma who presents the emergency department today complaining of shortness of breath and cough. Patient states that she was recently diagnosed with a URI 2 weeks ago and was sent home on prednisone. Patient had initial symptomatic improvement but over the last 3-4 days for symptoms have returned and worsened. Patient now complaining of significant shortness of breath and productive cough. Patient also complaining of chest pain upon deep inspiration. Patient has been taking home rescue inhaler with no relief. Patient has history of several hospitalizations for asthma exacerbations. Denies fever, dizziness, syncope, vomiting, sore throat, abdominal pain, leg swelling, palpitations. Patient is a former smoker. No oral contraception.  Past Medical History  Diagnosis Date  . Asthma   . Pregnant state, incidental   . Breast abscess   . Family history of anesthesia complication     Grandmother has difficulty waskin up.  . Depression     HX of - not on med n, doing well.  . Eczema   . Admission for sterilization 06/12/2013  . SVD (spontaneous vaginal delivery)     x 2  . Pneumonia     HX only -years ago has had a few times.  . S/P tubal ligation 06/13/2013   Past Surgical History  Procedure Laterality Date  . Incise and drain abcess      left breast x 2  . Incision and drainage abscess Left 02/17/2012    Procedure:  DRAINAGEof recurrent breast abscess;  Surgeon: Shelly Rubenstein, MD;  Location: [facility] OR;  Service: General;  Laterality: Left;  . Laparoscopic tubal ligation Bilateral 06/13/2013    Procedure: LAPAROSCOPIC TUBAL LIGATION;   Surgeon: Sherian Rein, MD;  Location: [facility];  Service: Gynecology;  Laterality: Bilateral;   Family History  Problem Relation Age of Onset  . Healthy Mother   . Anxiety disorder Mother   . Healthy Father   . Stroke Maternal Grandmother   . Stroke Paternal Grandfather   . Bipolar disorder Neg Hx   . Depression Neg Hx    Social History  Substance Use Topics  . Smoking status: Current Every Day Smoker -- 1.00 packs/day for 5 years    Types: Cigarettes    Last Attempt to Quit: 06/22/2010  . Smokeless tobacco: Never Used  . Alcohol Use: Yes     Comment: daily up until mid December 2015. Since then it is down to 1 beer a week   OB History    Gravida Para Term Preterm AB TAB SAB Ectopic Multiple Living   Review of Systems  All other systems reviewed and are negative.     Allergies  Doxycycline  Home Medications   Prior to Admission medications   Medication Sig Start Date End Date Taking? Authorizing Provider  albuterol (PROVENTIL HFA;VENTOLIN HFA) 108 (90 BASE) MCG/ACT inhaler Inhale 2 puffs into the lungs every 6 (six) hours as needed for wheezing or shortness of breath.   Yes Historical Provider, MD  albuterol (PROVENTIL) (2.5 MG/3ML) 0.083% nebulizer solution Take 3 mLs (2.5 mg total) by nebulization every 4 (four)  hours as needed for wheezing or shortness of breath. 10/15/14  Yes [name], PA-C  FLUoxetine (PROZAC) 20 MG capsule Take 1 capsule (20 mg total) by mouth daily. Patient not taking: Reported on 11/03/2014 05/28/14   [name], MD  HYDROcodone-acetaminophen (NORCO/VICODIN) 5-325 MG per tablet Take 1 tablet by mouth every 6 (six) hours as needed for moderate pain. Patient not taking: Reported on 11/03/2014 09/15/14   [name], PA-C  loratadine (CLARITIN) 10 MG tablet TAKE 1 TABLET BY MOUTH EVERY DAY Patient not taking: Reported on 11/03/2014 11/20/13   [name], MD  montelukast (SINGULAIR) 10 MG tablet Take 1  tablet (10 mg total) by mouth at bedtime. Patient not taking: Reported on 11/03/2014 09/11/13   [name], MD  predniSONE (DELTASONE) 50 MG tablet Take 1 tablet daily with breakfast Patient not taking: Reported on 11/03/2014 10/15/14   Joni ReiningNicole Pisciotta, PA-C  QUEtiapine (SEROQUEL) 50 MG tablet Take 1 tablet (50 mg total) by mouth at bedtime. Patient not taking: Reported on 11/03/2014 05/28/14 05/28/15  [name], MD   BP 122/71 mmHg  Pulse 110  Temp(Src) 98.4 F (36.9 C) (Oral)  Resp 18  SpO2 100%  LMP 10/28/2014 Physical Exam  Constitutional: She is oriented to person, place, and time. She appears well-developed and well-nourished. No distress.  HENT:  Head: Normocephalic and atraumatic.  Mouth/Throat: Oropharynx is clear and moist. No oropharyngeal exudate.  Eyes: Conjunctivae and EOM are normal. Pupils are equal, round, and reactive to light. Right eye exhibits no discharge. Left eye exhibits no discharge. No scleral icterus.  Neck: Normal range of motion. Neck supple.  Cardiovascular: Regular rhythm, normal heart sounds and intact distal pulses.  Exam reveals no gallop and no friction rub.   No murmur heard. Tachycardic to 120.  Pulmonary/Chest: Effort normal. No accessory muscle usage. No respiratory distress. She has wheezes in the right upper field, the right lower field, the left upper field and the left lower field. She has rales. She exhibits tenderness.  Diffuse inspiratory and expiratory wheezes throughout all lung fields. Rales appreciated.  Abdominal: Soft. Bowel sounds are normal. She exhibits no distension and no mass. There is no tenderness. There is no rebound and no guarding.  Musculoskeletal: Normal range of motion. She exhibits no edema or tenderness.  Lymphadenopathy:    She has no cervical adenopathy.  Neurological: She is alert and oriented to person, place, and time. No cranial nerve deficit.  Strength 5/5 throughout. No sensory deficits.  No gait  abnormality.  Skin: Skin is warm and dry. No rash noted. She is not diaphoretic. No erythema. No pallor.  Psychiatric: She has a normal mood and affect. Her behavior is normal.  Nursing note and vitals reviewed.   ED Course  Procedures (including critical care time) Labs Review Labs Reviewed  BASIC METABOLIC PANEL - Abnormal; Notable for the following:    Glucose, Bld 120 (*)    All other components within normal limits  CBC WITH DIFFERENTIAL/PLATELET    Imaging Review Dg Chest 2 View  11/03/2014  CLINICAL DATA:  Shortness of breath, cough, and congestion for 2 weeks, personal history of smoking EXAM: CHEST  2 VIEW COMPARISON:  01/02/2013 FINDINGS: Normal heart size, mediastinal contours, and pulmonary vascularity. Mild peribronchial thickening. No pulmonary infiltrate, pleural effusion, or pneumothorax. Bones unremarkable. IMPRESSION: Chronic bronchitic changes without infiltrate. Electronically Signed   By: Ulyses SouthwardMark  Boles [name]   On: 11/03/2014 13:28   I have personally reviewed and evaluated these  images and lab results as part of my medical decision-making.   EKG Interpretation   Date/Time:  Tuesday November 03 2014 12:27:55 EDT Ventricular Rate:  122 PR Interval:  151 QRS Duration: 81 QT Interval:  322 QTC Calculation: 459 R Axis:   87 Text Interpretation:  Sinus tachycardia Consider right atrial enlargement  Baseline wander in lead(s) V3 V4 V5 since last tracing no significant  change Confirmed by MILLER  MD, BRIAN ([id number]) on 11/03/2014 12:31:45 PM      MDM   Final diagnoses:  Asthma exacerbation    29 year old female with history of severe asthma presents with shortness of breath, productive cough. Patient diagnosed with URI 2 weeks ago and sent home on prednisone taper. Symptoms temporarily improved but have returned 3 days ago and worsened. Home albuterol inhaler used with no relief. No fever. Tachycardic in ED to 120. 98% O2 on room air. No tachypnea. Diffuse  inspiratory neck surgery wheezes appreciated on lung exam. Patient given 1 albuterol inhaler in triage and 2 DuoNeb abs when back in the emergency department. Patient reports minimal symptomatic improvement after treatment.  Chest x-ray reveals chronic bronchitic changes without infiltrate. Negative for infection.  IV placed. Patient given IV Decadron. Repeat lung exam without change. Patient placed on continuous nebulizer treatment and given 2 g magnesium. Repeat lung exam improved however wheezes still appreciated. Patient reports moderate symptomatic improvement after intervention. States that she feels stable for discharge. Patient ambulated with pulse ox. Saturations dropped briefly to 93%. Patient not increased in shortness of breath with ambulation. Patient feels that she is stable for discharge, does not want to be admitted at this time. Patient still tachycardic however feel that this may persist given the amount of albuterol patient has received today.  The patient is safe for discharge. We'll send home with azithromycin, prednisone taper, albuterol nebulizer solution. Patient has home albuterol inhaler. Patient given strict return precautions. These are outlined in her discharge instructions.  Patient was discussed with and seen by Dr. Hyacinth Meeker who agrees with the treatment plan.      Lester Kinsman Counce, PA-C 11/03/14 1750  Eber Hong, MD 11/04/14 [phone]

## 2014-11-03 NOTE — Discharge Instructions (Signed)
Asthma, Acute Bronchospasm °Acute bronchospasm caused by asthma is also referred to as an asthma attack. Bronchospasm means your air passages become narrowed. The narrowing is caused by inflammation and tightening of the muscles in the air tubes (bronchi) in your lungs. This can make it hard to breathe or cause you to wheeze and cough. °CAUSES °Possible triggers are: °· Animal dander from the skin, hair, or feathers of animals. °· Dust mites contained in house dust. °· Cockroaches. °· Pollen from trees or grass. °· Mold. °· Cigarette or tobacco smoke. °· Air pollutants such as dust, household cleaners, hair sprays, aerosol sprays, paint fumes, strong chemicals, or strong odors. °· Cold air or weather changes. Cold air may trigger inflammation. Winds increase molds and pollens in the air. °· Strong emotions such as crying or laughing hard. °· Stress. °· Certain medicines such as aspirin or beta-blockers. °· Sulfites in foods and drinks, such as dried fruits and wine. °· Infections or inflammatory conditions, such as a flu, cold, or inflammation of the nasal membranes (rhinitis). °· Gastroesophageal reflux disease (GERD). GERD is a condition where stomach acid backs up into your esophagus. °· Exercise or strenuous activity. °SIGNS AND SYMPTOMS  °· Wheezing. °· Excessive coughing, particularly at night. °· Chest tightness. °· Shortness of breath. °DIAGNOSIS  °Your health care provider will ask you about your medical history and perform a physical exam. A chest X-ray or blood testing may be performed to look for other causes of your symptoms or other conditions that may have triggered your asthma attack.  °TREATMENT  °Treatment is aimed at reducing inflammation and opening up the airways in your lungs.  Most asthma attacks are treated with inhaled medicines. These include quick relief or rescue medicines (such as bronchodilators) and controller medicines (such as inhaled corticosteroids). These medicines are sometimes  given through an inhaler or a nebulizer. Systemic steroid medicine taken by mouth or given through an IV tube also can be used to reduce the inflammation when an attack is moderate or severe. Antibiotic medicines are only used if a bacterial infection is present.  °HOME CARE INSTRUCTIONS  °· Rest. °· Drink plenty of liquids. This helps the mucus to remain thin and be easily coughed up. Only use caffeine in moderation and do not use alcohol until you have recovered from your illness. °· Do not smoke. Avoid being exposed to secondhand smoke. °· You play a critical role in keeping yourself in good health. Avoid exposure to things that cause you to wheeze or to have breathing problems. °· Keep your medicines up-to-date and available. Carefully follow your health care provider's treatment plan. °· Take your medicine exactly as prescribed. °· When pollen or pollution is bad, keep windows closed and use an air conditioner or go to places with air conditioning. °· Asthma requires careful medical care. See your health care provider for a follow-up as advised. If you are more than [redacted] weeks pregnant and you were prescribed any new medicines, let your obstetrician know about the visit and how you are doing. Follow up with your health care provider as directed. °· After you have recovered from your asthma attack, make an appointment with your outpatient doctor to talk about ways to reduce the likelihood of future attacks. If you do not have a doctor who manages your asthma, make an appointment with a primary care doctor to discuss your asthma. °SEEK IMMEDIATE MEDICAL CARE IF:  °· You are getting worse. °· You have trouble breathing. If severe, call your local   emergency services (911 in the [place] °· You develop chest pain or discomfort. °· You are vomiting. °· You are not able to keep fluids down. °· You are coughing up yellow, green, brown, or bloody sputum. °· You have a fever and your symptoms suddenly get worse. °· You have  trouble swallowing. °MAKE SURE YOU:  °· Understand these instructions. °· Will watch your condition. °· Will get help right away if you are not doing well or get worse. °  °This information is not intended to replace advice given to you by your health care provider. Make sure you discuss any questions you have with your health care provider. °  °Document Released: 04/05/2006 Document Revised: 12/24/2012 Document Reviewed: 06/26/2012 °Elsevier Interactive Patient Education ©2016 [employer] ° °Asthma Attack Prevention °While you may not be able to control the fact that you have asthma, you can take actions to prevent asthma attacks. The best way to prevent asthma attacks is to maintain good control of your asthma. You can achieve this by: °· Taking your medicines as directed. °· Avoiding things that can irritate your airways or make your asthma symptoms worse (asthma triggers). °· Keeping track of how well your asthma is controlled and of any changes in your symptoms. °· Responding quickly to worsening asthma symptoms (asthma attack). °· Seeking emergency care when it is needed. °WHAT ARE SOME WAYS TO PREVENT AN ASTHMA ATTACK? °Have a Plan °Work with your health care provider to create a written plan for managing and treating your asthma attacks (asthma action plan). This plan includes: °· A list of your asthma triggers and how you can avoid them. °· Information on when medicines should be taken and when their dosages should be changed. °· The use of a device that measures how well your lungs are working (peak flow meter). °Monitor Your Asthma °Use your peak flow meter and record your results in a journal every day. A drop in your peak flow numbers on one or more days may indicate the start of an asthma attack. This can happen even before you start to feel symptoms. You can prevent an asthma attack from getting worse by following the steps in your asthma action plan. °Avoid Asthma Triggers °Work with your asthma  health care provider to find out what your asthma triggers are. This can be done by: °· Allergy testing. °· Keeping a journal that notes when asthma attacks occur and the factors that may have contributed to them. °· Determining if there are other medical conditions that are making your asthma worse. °Once you have determined your asthma triggers, take steps to avoid them. This may include avoiding excessive or prolonged exposure to: °· Dust. Have someone dust and vacuum your home for you once or twice a week. Using a high-efficiency particulate arrestance (HEPA) vacuum is best. °· Smoke. This includes campfire smoke, forest fire smoke, and secondhand smoke from tobacco products. °· Pet dander. Avoid contact with animals that you know you are allergic to. °· Allergens from trees, grasses or pollens. Avoid spending a lot of time outdoors when pollen counts are high, and on very windy days. °· Very cold, dry, or humid air. °· Mold. °· Foods that contain high amounts of sulfites. °· Strong odors. °· Outdoor air pollutants, such as engine exhaust. °· Indoor air pollutants, such as aerosol sprays and fumes from household cleaners. °· Household pests, including dust mites and cockroaches, and pest droppings. °· Certain medicines, including NSAIDs. Always talk to your health   care provider before stopping or starting any new medicines. Medicines Take over-the-counter and prescription medicines only as told by your health care provider. Many asthma attacks can be prevented by carefully following your medicine schedule. Taking your medicines correctly is especially important when you cannot avoid certain asthma triggers. Act Quickly If an asthma attack does happen, acting quickly can decrease how severe it is and how long it lasts. Take these steps:   Pay attention to your symptoms. If you are coughing, wheezing, or having difficulty breathing, do not wait to see if your symptoms go away on their own. Follow your asthma  action plan.  If you have followed your asthma action plan and your symptoms are not improving, call your health care provider or seek immediate medical care at the nearest hospital. It is important to note how often you need to use your fast-acting rescue inhaler. If you are using your rescue inhaler more often, it may mean that your asthma is not under control. Adjusting your asthma treatment plan may help you to prevent future asthma attacks and help you to gain better control of your condition. HOW CAN I PREVENT AN ASTHMA ATTACK WHEN I EXERCISE? Follow advice from your health care provider about whether you should use your fast-acting inhaler before exercising. Many people with asthma experience exercise-induced bronchoconstriction (EIB). This condition often worsens during vigorous exercise in cold, humid, or dry environments. Usually, people with EIB can stay very active by pre-treating with a fast-acting inhaler before exercising.   This information is not intended to replace advice given to you by your health care provider. Make sure you discuss any questions you have with your health care provider.  Follow up with her primary care physician in 3 days for reevaluation. Return to the emergency department if you experience worsening of your symptoms, fever, shortness of breath, chest pain, loss of consciousness.

## 2014-11-03 NOTE — ED Provider Notes (Signed)
29 year old female, presents with worsening shortness of breath, ongoing asthma exacerbation. Recently treated for upper respiratory infection, no antibiotics, recent prednisone. On exam the patient has diffuse expiratory wheezing, she speaks in full sentences, no accessory muscle use, mild tachycardia, no significant hypoxia, after nebulized treatment she is still wheezing, she will get magnesium, second neb treatment, anticipate antibiotics and 5 days of prednisone, close follow-up. The patient is in agreement with the plan. Chest x-ray here shows no infiltrate  Medical screening examination/treatment/procedure(s) were conducted as a shared visit with non-physician practitioner(s) and myself.  I personally evaluated the patient during the encounter.  Clinical Impression:   Final diagnoses:  Asthma exacerbation         Eber HongBrian Challen Spainhour, MD 11/04/14 [phone]

## 2014-11-03 NOTE — ED Notes (Signed)
PA Sam at the bedside.

## 2014-11-17 ENCOUNTER — Ambulatory Visit (HOSPITAL_COMMUNITY): Payer: Self-pay | Admitting: Psychiatry

## 2014-11-23 ENCOUNTER — Emergency Department (HOSPITAL_COMMUNITY)
Admission: EM | Admit: 2014-11-23 | Discharge: 2014-11-23 | Disposition: A | Discharge status: Home / Self Care | Payer: 59 | Attending: Emergency Medicine | Admitting: Emergency Medicine

## 2014-11-23 ENCOUNTER — Encounter (HOSPITAL_COMMUNITY): Payer: Self-pay | Admitting: *Deleted

## 2014-11-23 DIAGNOSIS — F329 Major depressive disorder, single episode, unspecified: Secondary | ICD-10-CM | POA: Insufficient documentation

## 2014-11-23 DIAGNOSIS — J45901 Unspecified asthma with (acute) exacerbation: Secondary | ICD-10-CM

## 2014-11-23 DIAGNOSIS — Z8701 Personal history of pneumonia (recurrent): Secondary | ICD-10-CM | POA: Diagnosis not present

## 2014-11-23 DIAGNOSIS — Z9851 Tubal ligation status: Secondary | ICD-10-CM | POA: Diagnosis not present

## 2014-11-23 DIAGNOSIS — Z79899 Other long term (current) drug therapy: Secondary | ICD-10-CM | POA: Diagnosis not present

## 2014-11-23 DIAGNOSIS — F17210 Nicotine dependence, cigarettes, uncomplicated: Secondary | ICD-10-CM | POA: Diagnosis not present

## 2014-11-23 DIAGNOSIS — Z872 Personal history of diseases of the skin and subcutaneous tissue: Secondary | ICD-10-CM | POA: Insufficient documentation

## 2014-11-23 DIAGNOSIS — Z8742 Personal history of other diseases of the female genital tract: Secondary | ICD-10-CM | POA: Diagnosis not present

## 2014-11-23 DIAGNOSIS — R0602 Shortness of breath: Secondary | ICD-10-CM | POA: Diagnosis present

## 2014-11-23 MED ORDER — AEROCHAMBER PLUS W/MASK MISC
1.0000 | Freq: Once | Status: AC
  Administered 2014-11-23: 1
  Filled 2014-11-23: qty 1

## 2014-11-23 MED ORDER — PREDNISONE 20 MG PO TABS: ORAL_TABLET | ORAL | Status: DC

## 2014-11-23 MED ORDER — ALBUTEROL SULFATE HFA 108 (90 BASE) MCG/ACT IN AERS
2.0000 | INHALATION_SPRAY | Freq: Once | RESPIRATORY_TRACT | Status: AC
  Administered 2014-11-23: 2 via RESPIRATORY_TRACT
  Filled 2014-11-23: qty 6.7

## 2014-11-23 MED ORDER — PREDNISONE 20 MG PO TABS
40.0000 mg | ORAL_TABLET | Freq: Once | ORAL | Status: AC
  Administered 2014-11-23: 40 mg via ORAL
  Filled 2014-11-23: qty 2

## 2014-11-23 MED ORDER — IPRATROPIUM-ALBUTEROL 0.5-2.5 (3) MG/3ML IN SOLN
3.0000 mL | RESPIRATORY_TRACT | Status: AC
  Administered 2014-11-23 (×3): 3 mL via RESPIRATORY_TRACT
  Filled 2014-11-23: qty 6
  Filled 2014-11-23: qty 9
  Filled 2014-11-23: qty 3

## 2014-11-23 NOTE — ED Provider Notes (Signed)
CSN: [id number]     Arrival date & time 11/23/14  0930 History   First MD Initiated Contact with Patient 11/23/14 [phone]     Chief Complaint  Patient presents with  . Asthma     (Consider location/radiation/quality/duration/timing/severity/associated sxs/prior Treatment) Patient is a 29 y.o. female presenting with shortness of breath. The history is provided by the patient.  Shortness of Breath Severity:  Moderate Onset quality:  Sudden Duration:  2 weeks Timing:  Constant Progression:  Unchanged Chronicity:  Recurrent Relieved by:  Nothing Worsened by:  Nothing tried Ineffective treatments:  None tried Associated symptoms: cough and wheezing   Associated symptoms: no chest pain, no fever, no headaches and no vomiting   Risk factors: tobacco use     29 yo F with a chief complaint of asthma exacerbation. Patient had 3 of these in the past 6 weeks. States they occur whenever she stops her steroid treatment. Was last seen about 2 weeks ago" getting IV mag continuous nebs. Patient states she feels much better than that episode but is concerned about worsening. Been taking her home nebulizers every 2 hours. Has run out of her home inhaler. States that she's having difficulty obtaining follow-up with her family doctor.  Past Medical History  Diagnosis Date  . Asthma   . Pregnant state, incidental   . Breast abscess   . Family history of anesthesia complication     Grandmother has difficulty waskin up.  . Depression     HX of - not on med n, doing well.  . Eczema   . Admission for sterilization 06/12/2013  . SVD (spontaneous vaginal delivery)     x 2  . Pneumonia     HX only -years ago has had a few times.  . S/P tubal ligation 06/13/2013   Past Surgical History  Procedure Laterality Date  . Incise and drain abcess      left breast x 2  . Incision and drainage abscess Left 02/17/2012    Procedure:  DRAINAGEof recurrent breast abscess;  Surgeon: Shelly Rubenstein, MD;   Location: [facility] OR;  Service: General;  Laterality: Left;  . Laparoscopic tubal ligation Bilateral 06/13/2013    Procedure: LAPAROSCOPIC TUBAL LIGATION;  Surgeon: Sherian Rein, MD;  Location: [facility];  Service: Gynecology;  Laterality: Bilateral;   Family History  Problem Relation Age of Onset  . Healthy Mother   . Anxiety disorder Mother   . Healthy Father   . Stroke Maternal Grandmother   . Stroke Paternal Grandfather   . Bipolar disorder Neg Hx   . Depression Neg Hx    Social History  Substance Use Topics  . Smoking status: Current Every Day Smoker -- 1.00 packs/day for 5 years    Types: Cigarettes    Last Attempt to Quit: 06/22/2010  . Smokeless tobacco: Never Used  . Alcohol Use: Yes     Comment: daily up until mid December 2015. Since then it is down to 1 beer a week   OB History    Gravida Para Term Preterm AB TAB SAB Ectopic Multiple Living   Review of Systems  Constitutional: Negative for fever and chills.  HENT: Negative for congestion and rhinorrhea.   Eyes: Negative for redness and visual disturbance.  Respiratory: Positive for cough and wheezing. Negative for shortness of breath.   Cardiovascular: Negative for chest pain and palpitations.  Gastrointestinal: Negative for nausea and  vomiting.  Genitourinary: Negative for dysuria and urgency.  Musculoskeletal: Negative for myalgias and arthralgias.  Skin: Negative for pallor and wound.  Neurological: Negative for dizziness and headaches.      Allergies  Doxycycline  Home Medications   Prior to Admission medications   Medication Sig Start Date End Date Taking? Authorizing Provider  albuterol (PROVENTIL HFA;VENTOLIN HFA) 108 (90 BASE) MCG/ACT inhaler Inhale 2 puffs into the lungs every 6 (six) hours as needed for wheezing or shortness of breath.   Yes Historical Provider, MD  albuterol (PROVENTIL) (2.5 MG/3ML) 0.083% nebulizer solution Take 3 mLs (2.5 mg total) by nebulization every 4  (four) hours as needed for wheezing or shortness of breath. 10/15/14  Yes [name], PA-C  albuterol (PROVENTIL) (2.5 MG/3ML) 0.083% nebulizer solution Take 3 mLs (2.5 mg total) by nebulization every 6 (six) hours as needed for wheezing or shortness of breath. Patient not taking: Reported on 11/23/2014 11/03/14   [name], PA-C  azithromycin (ZITHROMAX) 250 MG tablet Take 1 tablet (250 mg total) by mouth daily. Take first 2 tablets together, then 1 every day until finished. Patient not taking: Reported on 11/23/2014 11/03/14   [name], PA-C  FLUoxetine (PROZAC) 20 MG capsule Take 1 capsule (20 mg total) by mouth daily. Patient not taking: Reported on 11/03/2014 05/28/14   Oletta Darter, MD  HYDROcodone-acetaminophen (NORCO/VICODIN) 5-325 MG per tablet Take 1 tablet by mouth every 6 (six) hours as needed for moderate pain. Patient not taking: Reported on 11/03/2014 09/15/14   Elson Areas, PA-C  loratadine (CLARITIN) 10 MG tablet TAKE 1 TABLET BY MOUTH EVERY DAY Patient not taking: Reported on 11/03/2014 11/20/13   Carney Living, MD  montelukast (SINGULAIR) 10 MG tablet Take 1 tablet (10 mg total) by mouth at bedtime. Patient not taking: Reported on 11/03/2014 09/11/13   Carney Living, MD  predniSONE (DELTASONE) 20 MG tablet 2 tabs po daily x 4 days 11/23/14   Melene Plan, DO  QUEtiapine (SEROQUEL) 50 MG tablet Take 1 tablet (50 mg total) by mouth at bedtime. Patient not taking: Reported on 11/03/2014 05/28/14 05/28/15  Oletta Darter, MD   BP 117/72 mmHg  Pulse 99  Temp(Src) 98.9 F (37.2 C) (Oral)  Resp 20  SpO2 98%  LMP 10/28/2014 Physical Exam  Constitutional: She is oriented to person, place, and time. She appears well-developed and well-nourished. No distress.  HENT:  Head: Normocephalic and atraumatic.  Eyes: EOM are normal. Pupils are equal, round, and reactive to light.  Neck: Normal range of motion. Neck supple.  Cardiovascular: Normal  rate and regular rhythm.  Exam reveals no gallop and no friction rub.   No murmur heard. Pulmonary/Chest: Effort normal. She has wheezes. She has no rales.  Diffuse end expiratory wheezes. Prolonged expiration  Abdominal: Soft. She exhibits no distension. There is no tenderness. There is no rebound and no guarding.  Musculoskeletal: She exhibits no edema or tenderness.  Neurological: She is alert and oriented to person, place, and time.  Skin: Skin is warm and dry. She is not diaphoretic.  Psychiatric: She has a normal mood and affect. Her behavior is normal.  Nursing note and vitals reviewed.   ED Course  Procedures (including critical care time) Labs Review Labs Reviewed - No data to display  Imaging Review No results found. I have personally reviewed and evaluated these images and lab results as part of my medical decision-making.   EKG Interpretation None      MDM  Final diagnoses:  Asthma exacerbation    29 yo F with a chief complaint of shortness of breath. Treat with 3 DuoNeb's back-to-back and prednisone. Patient is well-appearing and nontoxic noted retractions. Feel she is safe for discharge. Patient is afebrile with no retractions or focal lung findings feel x-ray would be unhelpful at this time.  Patient feeling much better, reassessed with improvement of breath sounds, no continued wheezing.  D/c home.  PCP follow up.  11:51 AM:  I have discussed the diagnosis/risks/treatment options with the patient and believe the pt to be eligible for discharge home to follow-up with PCP. We also discussed returning to the ED immediately if new or worsening sx occur. We discussed the sx which are most concerning (e.g., sudden worsening sob, fever) that necessitate immediate return. Medications administered to the patient during their visit and any new prescriptions provided to the patient are listed below.  Medications given during this visit Medications  ipratropium-albuterol  (DUONEB) 0.5-2.5 (3) MG/3ML nebulizer solution 3 mL (3 mLs Nebulization Given 11/23/14 1025)  predniSONE (DELTASONE) tablet 40 mg (40 mg Oral Given 11/23/14 1019)  albuterol (PROVENTIL HFA;VENTOLIN HFA) 108 (90 BASE) MCG/ACT inhaler 2 puff (2 puffs Inhalation Given 11/23/14 1149)  aerochamber plus with mask device 1 each (1 each Other Given 11/23/14 1149)    New Prescriptions   PREDNISONE (DELTASONE) 20 MG TABLET    2 tabs po daily x 4 days    The patient appears reasonably screen and/or stabilized for discharge and I doubt any other medical condition or other [facility] requiring further screening, evaluation, or treatment in the ED at this time prior to discharge.    Melene Planan Koren Plyler, DO 11/23/14 1151

## 2014-11-23 NOTE — ED Notes (Signed)
Pt reports being seen two weeks ago for asthma, at that time had to get Mag infusion and hour nebs. Reports having cough/asthma, no relief with nebs at home. spo2 97%.

## 2014-11-23 NOTE — Discharge Instructions (Signed)

## 2014-11-23 NOTE — ED Notes (Signed)
Pt. States last neb tx at 0700.

## 2014-12-03 ENCOUNTER — Encounter: Payer: Self-pay | Admitting: Family Medicine

## 2014-12-03 ENCOUNTER — Ambulatory Visit (INDEPENDENT_AMBULATORY_CARE_PROVIDER_SITE_OTHER): Payer: 59 | Admitting: Family Medicine

## 2014-12-03 VITALS — BP 124/72 | HR 103 | Temp 98.7°F | Ht 60.0 in | Wt 155.0 lb

## 2014-12-03 DIAGNOSIS — F17200 Nicotine dependence, unspecified, uncomplicated: Secondary | ICD-10-CM

## 2014-12-03 DIAGNOSIS — J4540 Moderate persistent asthma, uncomplicated: Secondary | ICD-10-CM

## 2014-12-03 MED ORDER — MOMETASONE FURO-FORMOTEROL FUM 200-5 MCG/ACT IN AERO: 2.0000 | INHALATION_SPRAY | Freq: Two times a day (BID) | RESPIRATORY_TRACT | Status: DC

## 2014-12-03 MED ORDER — BUPROPION HCL ER (XL) 150 MG PO TB24
150.0000 mg | ORAL_TABLET | Freq: Every day | ORAL | Status: DC
Start: 2014-12-03 — End: 2015-05-28

## 2014-12-03 MED ORDER — MOMETASONE FURO-FORMOTEROL FUM 200-5 MCG/ACT IN AERO
2.0000 | INHALATION_SPRAY | Freq: Two times a day (BID) | RESPIRATORY_TRACT | Status: DC
Start: 2014-12-03 — End: 2016-06-05

## 2014-12-03 MED ORDER — NICOTINE POLACRILEX 4 MG MT GUM: 4.0000 mg | CHEWING_GUM | OROMUCOSAL | Status: DC | PRN

## 2014-12-03 NOTE — Progress Notes (Signed)
   Subjective:    Patient ID: [name] [name], female    DOB: 1985-12-21, 29 y.o.   MRN: [id number]  HPI  CC: breathing  # Asthma issues:  Few weeks of symptoms worsened. She has been in and out of ED since October for breathing issues, has gotten antibiotics and steroids  Only on albuterol inhaler at home  Has been on dulera in the past but had to stop because of losing her insurance ROS: no fevers or chills, +cough, no CP, no LE edema  # Tobacco use  Wants to quit, knows it is making her asthma worse  Tried patches and gum, e-cig  1/2 pack a day generally. Sometimes more  Quit in a month confidence scale: 7/10  Social Hx: current smoker  Review of Systems   See HPI for ROS.   Past medical history, surgical, family, and social history reviewed and updated in the EMR as appropriate. Objective:  BP 124/72 mmHg  Pulse 103  Temp(Src) 98.7 F (37.1 C) (Oral)  Ht 5' (1.524 m)  Wt 155 lb (70.308 kg)  BMI 30.27 kg/m2  SpO2 98%  LMP 12/03/2014 (Exact Date) Vitals and nursing note reviewed  General: NAD CV: tachy, regular rhythm, normal s1s2, no murmurs, rubs or gallop. 2+ radial pulses bilaterally  Resp: end exp wheezes, normal effort Extremities: no edema  Assessment & Plan:  Asthma Worsened over past several months including multiple ED visits, steroid courses. Will restart dulera (now has insurance that will cover it), given sample in clinic with instruction by pharmacy clinic (did high dose dulera for now until symptoms better controlled, with frequent oral steroid use want to try and avoid any more prednisone bursts if possible). Follow up within 1 month.  Tobacco use disorder Seems committed to quitting based on her worsened asthma symptoms. Wants to try wellbutrin (currently off prozac and seroquel which she was taking for GAD and MDD). Also sent in rx for NRT gum. Follow up 1 month.

## 2014-12-03 NOTE — Patient Instructions (Signed)
Asthma: Start taking the dulera 2 puffs twice a day  Tobacco: Start wellbutrin 150mg  daily. After 1 week increase to 300mg  daily (can take 2 tablets of 150). If you experience any issues with this increased dose you can go back to the 150mg  and call us.  Use the nicotine gum for cravings. 4mg  gum, can cut this in half if 4mg  is too much. When you use this you should chew for a few seconds and then "park" it in your cheek to absorb the nicotine. If you continue chewing you will cause yourself to get nauseas.

## 2014-12-06 ENCOUNTER — Other Ambulatory Visit: Payer: Self-pay | Admitting: Family Medicine

## 2014-12-07 DIAGNOSIS — F17200 Nicotine dependence, unspecified, uncomplicated: Secondary | ICD-10-CM | POA: Insufficient documentation

## 2014-12-07 DIAGNOSIS — Z87891 Personal history of nicotine dependence: Secondary | ICD-10-CM | POA: Insufficient documentation

## 2014-12-07 NOTE — Assessment & Plan Note (Signed)
Worsened over past several months including multiple ED visits, steroid courses. Will restart dulera (now has insurance that will cover it), given sample in clinic with instruction by pharmacy clinic (did high dose dulera for now until symptoms better controlled, with frequent oral steroid use want to try and avoid any more prednisone bursts if possible). Follow up within 1 month.

## 2014-12-07 NOTE — Telephone Encounter (Signed)
Received an additional fax for patient stating that she would like a cheaper inhaler other than dulera.  Will forward to Dr. Waynetta SandyWight who saw patient for this most recent visit. Fox Salminen,CMA

## 2014-12-07 NOTE — Assessment & Plan Note (Signed)
Seems committed to quitting based on her worsened asthma symptoms. Wants to try wellbutrin (currently off prozac and seroquel which she was taking for GAD and MDD). Also sent in rx for NRT gum. Follow up 1 month.

## 2015-02-22 ENCOUNTER — Telehealth: Payer: Self-pay | Admitting: *Deleted

## 2015-02-22 NOTE — Telephone Encounter (Signed)
Prior Authorization received from [employer] for [employer].  PA form placed in provider box for completion. Clovis Pu, RN

## 2015-02-22 NOTE — Telephone Encounter (Signed)
PA faxed to [facility] for review.  Review process could take 24-72 hours to complete.  Martin, Tamika L, RN  

## 2015-02-23 NOTE — Telephone Encounter (Signed)
Called [employer] to check the status of PA. Patient does not have [employer] at this time.  Called [facility] to get the correct insurance information. Insurance information that was given by [facility] was also the wrong information.  Tried calling patient number list; they were invalid numbers.  Will forward to PCP.  PA cancels through [facility] due to invalid ID and no coverage. Clovis Pu, RN

## 2015-03-02 ENCOUNTER — Telehealth: Payer: Self-pay | Admitting: Family Medicine

## 2015-03-02 NOTE — Telephone Encounter (Signed)
Patient is needing her Dulera switched to something else because insurance is not paying enough.   She said she has been trying to get this changed for 3 months.

## 2015-03-03 MED ORDER — FLUTICASONE PROPIONATE HFA 220 MCG/ACT IN AERO: 2.0000 | INHALATION_SPRAY | Freq: Two times a day (BID) | RESPIRATORY_TRACT | Status: DC

## 2015-03-03 NOTE — Telephone Encounter (Signed)
pls call her n let her know i called in high dose flovent until she can figure out what her insurance will pu for and she needs to come into see me soon thanks

## 2015-03-03 NOTE — Telephone Encounter (Signed)
Will forward to MD to advise. Intisar Claudio,CMA  

## 2015-03-04 NOTE — Telephone Encounter (Signed)
Tried to call patient but VM is not set up yet.  Will try calling again later today.  Jearldean Gutt,CMA

## 2015-03-08 NOTE — Telephone Encounter (Signed)
Clld pt - advsd of higher dose of Flovent being electronically sent to her pharmacy as well as her needing to schedule a follow up office vist/ Pt stated she understood and would call back to schedule appt. Damita DunningsMichelle Byrd [facility] CenterMorehead,CMA

## 2015-04-04 ENCOUNTER — Encounter (HOSPITAL_COMMUNITY): Payer: Self-pay | Admitting: *Deleted

## 2015-04-04 ENCOUNTER — Emergency Department (HOSPITAL_COMMUNITY)
Admission: EM | Admit: 2015-04-04 | Discharge: 2015-04-04 | Disposition: A | Discharge status: Home / Self Care | Payer: 59 | Attending: Emergency Medicine | Admitting: Emergency Medicine

## 2015-04-04 DIAGNOSIS — Z7951 Long term (current) use of inhaled steroids: Secondary | ICD-10-CM | POA: Insufficient documentation

## 2015-04-04 DIAGNOSIS — Z8659 Personal history of other mental and behavioral disorders: Secondary | ICD-10-CM | POA: Insufficient documentation

## 2015-04-04 DIAGNOSIS — Y9289 Other specified places as the place of occurrence of the external cause: Secondary | ICD-10-CM | POA: Insufficient documentation

## 2015-04-04 DIAGNOSIS — T2065XA Corrosion of second degree of scalp [any part], initial encounter: Secondary | ICD-10-CM | POA: Diagnosis not present

## 2015-04-04 DIAGNOSIS — J45909 Unspecified asthma, uncomplicated: Secondary | ICD-10-CM | POA: Diagnosis not present

## 2015-04-04 DIAGNOSIS — R21 Rash and other nonspecific skin eruption: Secondary | ICD-10-CM | POA: Diagnosis present

## 2015-04-04 DIAGNOSIS — Z8701 Personal history of pneumonia (recurrent): Secondary | ICD-10-CM | POA: Diagnosis not present

## 2015-04-04 DIAGNOSIS — Z79899 Other long term (current) drug therapy: Secondary | ICD-10-CM | POA: Insufficient documentation

## 2015-04-04 DIAGNOSIS — Y9389 Activity, other specified: Secondary | ICD-10-CM | POA: Diagnosis not present

## 2015-04-04 DIAGNOSIS — X58XXXA Exposure to other specified factors, initial encounter: Secondary | ICD-10-CM | POA: Diagnosis not present

## 2015-04-04 DIAGNOSIS — Z8742 Personal history of other diseases of the female genital tract: Secondary | ICD-10-CM | POA: Diagnosis not present

## 2015-04-04 DIAGNOSIS — F17210 Nicotine dependence, cigarettes, uncomplicated: Secondary | ICD-10-CM | POA: Diagnosis not present

## 2015-04-04 DIAGNOSIS — Y998 Other external cause status: Secondary | ICD-10-CM | POA: Diagnosis not present

## 2015-04-04 DIAGNOSIS — T656X1A Toxic effect of paints and dyes, not elsewhere classified, accidental (unintentional), initial encounter: Secondary | ICD-10-CM | POA: Diagnosis not present

## 2015-04-04 DIAGNOSIS — T304 Corrosion of unspecified body region, unspecified degree: Secondary | ICD-10-CM

## 2015-04-04 DIAGNOSIS — L259 Unspecified contact dermatitis, unspecified cause: Secondary | ICD-10-CM | POA: Diagnosis not present

## 2015-04-04 MED ORDER — PREDNISONE 20 MG PO TABS
60.0000 mg | ORAL_TABLET | Freq: Once | ORAL | Status: AC
  Administered 2015-04-04: 60 mg via ORAL
  Filled 2015-04-04: qty 3

## 2015-04-04 MED ORDER — MUPIROCIN 2 % EX OINT: 1.0000 "application " | TOPICAL_OINTMENT | Freq: Two times a day (BID) | CUTANEOUS | Status: DC

## 2015-04-04 MED ORDER — CEPHALEXIN 500 MG PO CAPS: 500.0000 mg | ORAL_CAPSULE | Freq: Four times a day (QID) | ORAL | Status: DC

## 2015-04-04 MED ORDER — PREDNISONE 50 MG PO TABS: ORAL_TABLET | ORAL | Status: DC

## 2015-04-04 MED ORDER — HYDROCODONE-ACETAMINOPHEN 5-325 MG PO TABS: 2.0000 | ORAL_TABLET | ORAL | Status: DC | PRN

## 2015-04-04 MED ORDER — HYDROCODONE-ACETAMINOPHEN 5-325 MG PO TABS
1.0000 | ORAL_TABLET | Freq: Once | ORAL | Status: AC
  Administered 2015-04-04: 1 via ORAL
  Filled 2015-04-04: qty 1

## 2015-04-04 NOTE — ED Provider Notes (Signed)
CSN: [id number]     Arrival date & time 04/04/15  1119 History  By signing my name below, I, [name] [name], attest that this documentation has been prepared under the direction and in the presence of [name], PA-C Electronically Signed: Soijett [name], ED Scribe. 04/04/2015. 1:29 PM.  Chief Complaint  Patient presents with  . Rash      The history is provided by the patient. No language interpreter was used.    [name] [name] is a 30 y.o. female with a medical hx of eczema who presents to the Emergency Department complaining of rash to hairline onset 4 days. Pt reports that she believes her rash is due to a hair dye that she used 2 weeks ago. Pt reports that she now has pain to her entire head and that there is drainage to her scalp. Pt notes that she has used the hair dye for 15 years and she hasn't used it for several years due to being pregnant. Pt hasn't had any issues with the hair dye in the past. Pt denies new soaps/medications/pets/environment/lotion/detergent.   Pt is having associated symptoms of color change, drainage, bilateral ear swelling with blisters. She notes that she has tried warm water rinses and 2 benadryl every 8 hours with no relief of her symptoms. She denies fever, chills, and any other symptoms. Pt is not allergic to any medications. Pt PCP is [name].     Past Medical History  Diagnosis Date  . Asthma   . Pregnant state, incidental   . Breast abscess   . Family history of anesthesia complication     Grandmother has difficulty waskin up.  . Depression     HX of - not on med n, doing well.  . Eczema   . Admission for sterilization 06/12/2013  . SVD (spontaneous vaginal delivery)     x 2  . Pneumonia     HX only -years ago has had a few times.  . S/P tubal ligation 06/13/2013   Past Surgical History  Procedure Laterality Date  . Incise and drain abcess      left breast x 2  . Incision and drainage abscess Left 02/17/2012     Procedure:  DRAINAGEof recurrent breast abscess;  Surgeon: Shelly Rubenstein, MD;  Location: [facility] OR;  Service: General;  Laterality: Left;  . Laparoscopic tubal ligation Bilateral 06/13/2013    Procedure: LAPAROSCOPIC TUBAL LIGATION;  Surgeon: Sherian Rein, MD;  Location: [facility] ORS;  Service: Gynecology;  Laterality: Bilateral;   Family History  Problem Relation Age of Onset  . Healthy Mother   . Anxiety disorder Mother   . Healthy Father   . Stroke Maternal Grandmother   . Stroke Paternal Grandfather   . Bipolar disorder Neg Hx   . Depression Neg Hx    Social History  Substance Use Topics  . Smoking status: Current Every Day Smoker -- 1.00 packs/day for 5 years    Types: Cigarettes    Last Attempt to Quit: 06/22/2010  . Smokeless tobacco: Never Used  . Alcohol Use: Yes     Comment: daily up until mid December 2015. Since then it is down to 1 beer a week   OB History    Gravida Para Term Preterm AB TAB SAB Ectopic Multiple Living   Review of Systems  Constitutional: Negative for fever and chills.  HENT:  Bilateral ear swelling with associated blisters. Redness and drainage to scalp  Skin: Positive for color change and rash.  All other systems reviewed and are negative.     Allergies  Doxycycline  Home Medications   Prior to Admission medications   Medication Sig Start Date End Date Taking? Authorizing Provider  albuterol (PROVENTIL) (2.5 MG/3ML) 0.083% nebulizer solution Take 3 mLs (2.5 mg total) by nebulization every 4 (four) hours as needed for wheezing or shortness of breath. 10/15/14   [name], PA-C  azithromycin (ZITHROMAX) 250 MG tablet Take 1 tablet (250 mg total) by mouth daily. Take first 2 tablets together, then 1 every day until finished. Patient not taking: Reported on 11/23/2014 11/03/14   [name], PA-C  buPROPion (WELLBUTRIN XL) 150 MG 24 hr tablet Take 1 tablet (150 mg total) by mouth daily. Increase to  2 tabs daily after 7 days 12/03/14   [name], MD  fluticasone [facility](FLOVENT HFA) 220 MCG/ACT inhaler Inhale 2 puffs into the lungs 2 (two) times daily. 03/03/15   [name], MD  HYDROcodone-acetaminophen (NORCO/VICODIN) 5-325 MG per tablet Take 1 tablet by mouth every 6 (six) hours as needed for moderate pain. Patient not taking: Reported on 11/03/2014 09/15/14   [name], PA-C  loratadine (CLARITIN) 10 MG tablet TAKE 1 TABLET BY MOUTH EVERY DAY Patient not taking: Reported on 11/03/2014 11/20/13   [name], MD  mometasone-formoterol (DULERA) 200-5 MCG/ACT AERO Inhale 2 puffs into the lungs 2 (two) times daily. 12/03/14   [name], MD  mometasone-formoterol [facility](DULERA) 200-5 MCG/ACT AERO Inhale 2 puffs into the lungs 2 (two) times daily. 12/03/14   [name], MD  montelukast (SINGULAIR) 10 MG tablet Take 1 tablet (10 mg total) by mouth at bedtime. Patient not taking: Reported on 11/03/2014 09/11/13   [name], MD  nicotine polacrilex (NICORETTE) 4 MG gum Take 1 each (4 mg total) by mouth as needed for smoking cessation. 12/03/14   [name], MD  predniSONE (DELTASONE) 20 MG tablet 2 tabs po daily x 4 days 11/23/14   [name], DO  PROAIR HFA 108 (90 BASE) MCG/ACT inhaler INHALE 2 PUFFS BY MOUTH EVERY 2 HOURS AS NEEDED FOR SHORTNESS OF BREATH 12/07/14   [name], MD  QUEtiapine (SEROQUEL) 50 MG tablet Take 1 tablet (50 mg total) by mouth at bedtime. Patient not taking: Reported on 11/03/2014 05/28/14 05/28/15  [name], MD   BP 131/76 mmHg  Pulse 100  Temp(Src) 98.1 F (36.7 C) (Oral)  Ht 5' (1.524 m)  Wt 145 lb 6 oz (65.942 kg)  BMI 28.39 kg/m2  SpO2 98%  LMP 03/22/2015  Breastfeeding? No Physical Exam  Constitutional: She is oriented to person, place, and time. She appears well-developed and well-nourished. No distress.  HENT:  Head: Normocephalic and atraumatic.  Significant erythema and vesicular rash to scalp and along tops of  ears. Blisters are in different stages of healing are weeping serosanguinous fluid.   Eyes: Conjunctivae are normal. Right eye exhibits no discharge. Left eye exhibits no discharge. No scleral icterus.  Cardiovascular: Normal rate.   Pulmonary/Chest: Effort normal. No respiratory distress.  Airway intact. No respiratory distress. No oral swelling.   Neurological: She is alert and oriented to person, place, and time. Coordination normal.  Skin: Skin is warm and dry. No rash noted. She is not diaphoretic. No erythema. No pallor.  Psychiatric: She has a normal mood and affect. Her behavior is  normal.  Nursing note and vitals reviewed.   ED Course  Procedures (including critical care time) DIAGNOSTIC STUDIES: Oxygen Saturation is 98% on RA, nl by my interpretation.    COORDINATION OF CARE: 1:29 PM Discussed treatment plan with pt at bedside which includes continue benadryl use, abx Rx, steroid Rx, and pt agreed to plan.    Labs Review Labs Reviewed - No data to display  Imaging Review No results found.    EKG Interpretation None      MDM   Final diagnoses:  Contact dermatitis  Chemical burn     Patient with contact dermatitis, possible surrounding cellulitis. Instructed to avoid offending agent, hair dye. Will treat with keflex Rx, prednisone Rx, and topical Bactroban Rx. Follow up with PCP in 2-3 days. Return precautions discussed. Pt is safe for discharge at this time.    I personally performed the services described in this documentation, which was scribed in my presence. The recorded information has been reviewed and is accurate.     Lester Kinsman [place], PA-C 04/07/15 2105  Glynn Octave, MD 04/08/15 [phone]

## 2015-04-04 NOTE — ED Notes (Signed)
Pt used hair dye 2 weeks ago. Started with rash to scalp 1 week ago. Now scalp very red and painful.

## 2015-04-04 NOTE — Discharge Instructions (Signed)
Contact Dermatitis °Dermatitis is redness, soreness, and swelling (inflammation) of the skin. Contact dermatitis is a reaction to certain substances that touch the skin. There are two types of contact dermatitis:  °· Irritant contact dermatitis. This type is caused by something that irritates your skin, such as dry hands from washing them too much. This type does not require previous exposure to the substance for a reaction to occur. This type is more common. °· Allergic contact dermatitis. This type is caused by a substance that you are allergic to, such as a nickel allergy or poison ivy. This type only occurs if you have been exposed to the substance (allergen) before. Upon a repeat exposure, your body reacts to the substance. This type is less common. °CAUSES  °Many different substances can cause contact dermatitis. Irritant contact dermatitis is most commonly caused by exposure to:  °· Makeup.   °· Soaps.   °· Detergents.   °· Bleaches.   °· Acids.   °· Metal salts, such as nickel.   °Allergic contact dermatitis is most commonly caused by exposure to:  °· Poisonous plants.   °· Chemicals.   °· Jewelry.   °· Latex.   °· Medicines.   °· Preservatives in products, such as clothing.   °RISK FACTORS °This condition is more likely to develop in:  °· People who have jobs that expose them to irritants or allergens. °· People who have certain medical conditions, such as asthma or eczema.   °SYMPTOMS  °Symptoms of this condition may occur anywhere on your body where the irritant has touched you or is touched by you. Symptoms include: °· Dryness or flaking.   °· Redness.   °· Cracks.   °· Itching.   °· Pain or a burning feeling.   °· Blisters. °· Drainage of small amounts of blood or clear fluid from skin cracks. °With allergic contact dermatitis, there may also be swelling in areas such as the eyelids, mouth, or genitals.  °DIAGNOSIS  °This condition is diagnosed with a medical history and physical exam. A patch skin test  may be performed to help determine the cause. If the condition is related to your job, you may need to see an occupational medicine specialist. °TREATMENT °Treatment for this condition includes figuring out what caused the reaction and protecting your skin from further contact. Treatment may also include:  °· Steroid creams or ointments. Oral steroid medicines may be needed in more severe cases. °· Antibiotics or antibacterial ointments, if a skin infection is present. °· Antihistamine lotion or an antihistamine taken by mouth to ease itching. °· A bandage (dressing). °HOME CARE INSTRUCTIONS °Skin Care  °· Moisturize your skin as needed.   °· Apply cool compresses to the affected areas. °· Try taking a bath with: °¨ Epsom salts. Follow the instructions on the packaging. You can get these at your local pharmacy or grocery store. °¨ Baking soda. Pour a small amount into the bath as directed by your health care provider. °¨ Colloidal oatmeal. Follow the instructions on the packaging. You can get this at your local pharmacy or grocery store. °· Try applying baking soda paste to your skin. Stir water into baking soda until it reaches a paste-like consistency. °· Do not scratch your skin. °· Bathe less frequently, such as every other day. °· Bathe in lukewarm water. Avoid using hot water. °Medicines  °· Take or apply over-the-counter and prescription medicines only as told by your health care provider.   °· If you were prescribed an antibiotic medicine, take or apply your antibiotic as told by your health care provider. Do not stop using the   antibiotic even if your condition starts to improve. General Instructions  Keep all follow-up visits as told by your health care provider. This is important.  Avoid the substance that caused your reaction. If you do not know what caused it, keep a journal to try to track what caused it. Write down:  What you eat.  What cosmetic products you use.  What you drink.  What  you wear in the affected area. This includes jewelry.  If you were given a dressing, take care of it as told by your health care provider. This includes when to change and remove it. SEEK MEDICAL CARE IF:   Your condition does not improve with treatment.  Your condition gets worse.  You have signs of infection such as swelling, tenderness, redness, soreness, or warmth in the affected area.  You have a fever.  You have new symptoms. SEEK IMMEDIATE MEDICAL CARE IF:   You have a severe headache, neck pain, or neck stiffness.  You vomit.  You feel very sleepy.  You notice red streaks coming from the affected area.  Your bone or joint underneath the affected area becomes painful after the skin has healed.  The affected area turns darker.  You have difficulty breathing.   This information is not intended to replace advice given to you by your health care provider. Make sure you discuss any questions you have with your health care provider.   Continue taking home Benadryl. Take antibiotics and prednisone as prescribed. Apply antibiotic ointment to scalp daily. Wash scalp with warm water. Avoid using soaps or shampoos on your scalp until healed. Follow up with your primary care doctor as soon as possible for a wound recheck. Return to the ED if you experience severe worsening of your symptoms, fever, chills.

## 2015-04-04 NOTE — ED Notes (Signed)
Pt in c/o head rash onset x 2 wks ago worsening today, pt has visible blisters to front of the scalp bil top of her ears & rash to the neck, pt scalp is red, denies pain & rash anywhere else, reports onset of rash post using hair dye, pt reports similar episode in the past without severe symptoms, A&O x4, denies SOB & difficulty breathing

## 2015-05-10 ENCOUNTER — Ambulatory Visit: Payer: Self-pay | Admitting: Family Medicine

## 2015-05-28 ENCOUNTER — Encounter: Payer: Self-pay | Admitting: Family Medicine

## 2015-05-28 ENCOUNTER — Ambulatory Visit (INDEPENDENT_AMBULATORY_CARE_PROVIDER_SITE_OTHER): Payer: 59 | Admitting: Family Medicine

## 2015-05-28 VITALS — BP 110/60 | HR 85 | Temp 98.0°F | Ht 60.0 in | Wt 145.0 lb

## 2015-05-28 DIAGNOSIS — F411 Generalized anxiety disorder: Secondary | ICD-10-CM

## 2015-05-28 DIAGNOSIS — L309 Dermatitis, unspecified: Secondary | ICD-10-CM | POA: Diagnosis not present

## 2015-05-28 MED ORDER — HYDROXYZINE HCL 25 MG PO TABS: 25.0000 mg | ORAL_TABLET | Freq: Three times a day (TID) | ORAL | Status: DC | PRN

## 2015-05-28 MED ORDER — TRIAMCINOLONE ACETONIDE 0.5 % EX OINT: 1.0000 "application " | TOPICAL_OINTMENT | Freq: Every day | CUTANEOUS | Status: DC

## 2015-05-28 MED ORDER — SERTRALINE HCL 50 MG PO TABS: 50.0000 mg | ORAL_TABLET | Freq: Every day | ORAL | Status: DC

## 2015-05-28 NOTE — Assessment & Plan Note (Signed)
Has been off medicines for at least several months, reports significant stressors and episodes. Will start sertraline, atarax. Follow up 2 weeks.

## 2015-05-28 NOTE — Progress Notes (Signed)
   Subjective:    Patient ID: [name] [name], female    DOB: 03/16/1985, 30 y.o.   MRN: [id number]  HPI  CC: eczema  # Eczema:  Has been on triamcinolone cream for a long time  Feels like the cream is not helping anymore  moreso after sweating, gets bad itching in elbow, back of legs, back; she breaks out with a rash that goes away within a few days ROS: no joint pains, no changes in vision, no trouble swallowing, no trouble peeing  # Anxiety  Getting "really bad"  Lost insurance because she couldn't afford.   Trigger used to be driving; now feels like being around people, taking kids to park.   Symptoms feels like heart fluttering, racing, fast breathing, feels like she is dying  Usually jsut lets it pass, takes about 30 minutes usually.   Last episode was Wednesday. Has been happening almost every single day, about 20 times in last month.  ROS: no SI or HI  GAD7 score 21  Social Hx: current smoker, 1ppd.  Review of Systems   See HPI for ROS.   Past medical history, surgical, family, and social history reviewed and updated in the EMR as appropriate. Objective:  BP 110/60 mmHg  Pulse 85  Temp(Src) 98 F (36.7 C) (Oral)  Ht 5' (1.524 m)  Wt 145 lb (65.772 kg)  BMI 28.32 kg/m2  SpO2 98%  LMP 05/22/2015 (Exact Date) Vitals and nursing note reviewed  General: no apparent distress  Skin: there is a difuse eczematous appearing rash on both arms.  Psych: normal mood and affect, normal thought content and speech.  Assessment & Plan:  GAD (generalized anxiety disorder) Has been off medicines for at least several months, reports significant stressors and episodes. Will start sertraline, atarax. Follow up 2 weeks.  Eczema History seems consistent with eczema and possibly allergy to patient sweat. Discussed trial of atarax for itchy, zyrtec/allegra/claritin, and triamcinolone 0.5%. If still having significant issues may need referral to allergist if continues to  get rash eruptions not fully consistent with eczema.

## 2015-05-28 NOTE — Assessment & Plan Note (Signed)
History seems consistent with eczema and possibly allergy to patient sweat. Discussed trial of atarax for itchy, zyrtec/allegra/claritin, and triamcinolone 0.5%. If still having significant issues may need referral to allergist if continues to get rash eruptions not fully consistent with eczema.

## 2015-05-28 NOTE — Patient Instructions (Signed)
Hydroxyzine: take up to 3 times a day as needed for anxiety or itching  Sertraline: take 50mg  once a day. Call us if you experience any thoughts of hurting yourself or others. Biggest side effect GI upset which gets better with time.

## 2015-07-02 ENCOUNTER — Ambulatory Visit: Payer: Self-pay | Admitting: Family Medicine

## 2015-11-02 ENCOUNTER — Emergency Department (HOSPITAL_COMMUNITY)
Admission: EM | Admit: 2015-11-02 | Discharge: 2015-11-02 | Disposition: A | Discharge status: Home / Self Care | Payer: Self-pay | Attending: Emergency Medicine | Admitting: Emergency Medicine

## 2015-11-02 ENCOUNTER — Encounter (HOSPITAL_COMMUNITY): Payer: Self-pay

## 2015-11-02 ENCOUNTER — Emergency Department (HOSPITAL_COMMUNITY): Payer: Self-pay

## 2015-11-02 DIAGNOSIS — J45909 Unspecified asthma, uncomplicated: Secondary | ICD-10-CM | POA: Insufficient documentation

## 2015-11-02 DIAGNOSIS — Z79899 Other long term (current) drug therapy: Secondary | ICD-10-CM | POA: Insufficient documentation

## 2015-11-02 DIAGNOSIS — L309 Dermatitis, unspecified: Secondary | ICD-10-CM | POA: Insufficient documentation

## 2015-11-02 DIAGNOSIS — F17210 Nicotine dependence, cigarettes, uncomplicated: Secondary | ICD-10-CM | POA: Insufficient documentation

## 2015-11-02 DIAGNOSIS — J069 Acute upper respiratory infection, unspecified: Secondary | ICD-10-CM | POA: Insufficient documentation

## 2015-11-02 MED ORDER — ALBUTEROL SULFATE HFA 108 (90 BASE) MCG/ACT IN AERS
2.0000 | INHALATION_SPRAY | Freq: Once | RESPIRATORY_TRACT | Status: AC
  Administered 2015-11-02: 2 via RESPIRATORY_TRACT
  Filled 2015-11-02: qty 6.7

## 2015-11-02 MED ORDER — PREDNISONE 10 MG (21) PO TBPK: 10.0000 mg | ORAL_TABLET | Freq: Every day | ORAL | 0 refills | Status: DC

## 2015-11-02 MED ORDER — ALBUTEROL SULFATE (2.5 MG/3ML) 0.083% IN NEBU
5.0000 mg | INHALATION_SOLUTION | Freq: Once | RESPIRATORY_TRACT | Status: AC
  Administered 2015-11-02: 5 mg via RESPIRATORY_TRACT
  Filled 2015-11-02: qty 6

## 2015-11-02 MED ORDER — HYDROXYZINE HCL 10 MG PO TABS: 10.0000 mg | ORAL_TABLET | Freq: Four times a day (QID) | ORAL | 0 refills | Status: DC | PRN

## 2015-11-02 MED ORDER — BENZONATATE 100 MG PO CAPS: 200.0000 mg | ORAL_CAPSULE | Freq: Two times a day (BID) | ORAL | 0 refills | Status: DC | PRN

## 2015-11-02 MED ORDER — PREDNISONE 20 MG PO TABS
60.0000 mg | ORAL_TABLET | Freq: Once | ORAL | Status: AC
  Administered 2015-11-02: 60 mg via ORAL
  Filled 2015-11-02: qty 3

## 2015-11-02 MED ORDER — OXYMETAZOLINE HCL 0.05 % NA SOLN: 1.0000 | Freq: Two times a day (BID) | NASAL | 0 refills | Status: DC

## 2015-11-02 MED ORDER — BETAMETHASONE DIPROPIONATE 0.05 % EX OINT: TOPICAL_OINTMENT | Freq: Two times a day (BID) | CUTANEOUS | 0 refills | Status: DC

## 2015-11-02 NOTE — ED Notes (Signed)
Patient returned from xray.

## 2015-11-02 NOTE — Discharge Instructions (Signed)
Take your medications as prescribed. I also recommend applying in an emollient such as Eucerin onto her skin where your eczema is present for additional relief. Continue using your albuterol inhaler as needed for wheezing/shortness of breath. I recommend following up with the dermatology clinic listed above within the next week if he rash has not improved or has worsened. Follow-up with your primary care provider within the next week as needed regarding your asthma/URI. Please return to the Emergency Department if symptoms worsen or new onset of fever, headache, neck stiffness, coughing up blood, difficulty breathing, chest pain, abdominal pain, vomiting, redness, swelling, warmth, drainage, new/worsening rash.

## 2015-11-02 NOTE — ED Notes (Signed)
Patient transported to X-ray 

## 2015-11-02 NOTE — ED Provider Notes (Signed)
[facility]-EMERGENCY DEPT Provider Note   CSN: [id number] Arrival date & time: 11/02/15  1405  By signing my name below, I, Sonum Patel, attest that this documentation has been prepared under the direction and in the presence of Melburn Hake, [place]. Electronically Signed: Sonum Patel, Neurosurgeon. 11/02/15. 3:13 PM.  History   Chief Complaint Chief Complaint  Patient presents with  . Rash    eczema   . Asthma    The history is provided by the patient. No language interpreter was used.     HPI Comments: [name] [name] is a 30 y.o. female with past medical history of asthma, eczema who presents to the Emergency Department complaining of 1 month of a constant, gradually worsening rash to the bilateral lower extremities, bilateral upper extremities, and torso. She has applied triamcinolone and hydrocortisone cream without relief. She has a history of eczema and states this is similar but worse. She denies change in soaps, lotions or detergents, new medications or food; denies being in wooded areas; denies suspicious food intake.   She also complains of nonproductive cough with associated nasal congestion, mild wheezing and mild chest tightness for the past few days. She usually uses an inhaler but states she has run out. She denies fever, vomiting, CP, hemoptysis, abdominal pain, diarrhea. Denies taking any medications for her sxs.   Past Medical History:  Diagnosis Date  . Admission for sterilization 06/12/2013  . Asthma   . Breast abscess   . Depression    HX of - not on med n, doing well.  . Eczema   . Family history of anesthesia complication    Grandmother has difficulty waskin up.  . Pneumonia    HX only -years ago has had a few times.  . Pregnant state, incidental   . S/P tubal ligation 06/13/2013  . SVD (spontaneous vaginal delivery)    x 2    Patient Active Problem List   Diagnosis Date Noted  . Tobacco use disorder 12/07/2014  . Major depressive disorder, recurrent  episode, moderate (HCC) 01/13/2014  . GAD (generalized anxiety disorder) 01/13/2014  . Tinea pedis of both feet 09/10/2013  . S/P tubal ligation 06/13/2013  . Admission for sterilization 06/12/2013  . Rash and nonspecific skin eruption 06/09/2013  . Neck discomfort 02/26/2013  . Tachycardia 10/23/2012  . Allergic conjunctivitis of both eyes and rhinitis 10/14/2012  . Mastitis, Left. 11/27/2011  . Depression 03/24/2011  . Asthma 03/01/2006  . Eczema 03/01/2006    Past Surgical History:  Procedure Laterality Date  . INCISE AND DRAIN ABCESS     left breast x 2  . INCISION AND DRAINAGE ABSCESS Left 02/17/2012   Procedure:  DRAINAGEof recurrent breast abscess;  Surgeon: Shelly Rubenstein, MD;  Location: [facility] OR;  Service: General;  Laterality: Left;  . LAPAROSCOPIC TUBAL LIGATION Bilateral 06/13/2013   Procedure: LAPAROSCOPIC TUBAL LIGATION;  Surgeon: Sherian Rein, MD;  Location: [facility];  Service: Gynecology;  Laterality: Bilateral;    OB History    Gravida Para Term Preterm AB Living   2 2 1 1   2    SAB TAB Ectopic Multiple Live Births           2       Home Medications    Prior to Admission medications   Medication Sig Start Date End Date Taking? Authorizing Provider  albuterol (PROVENTIL) (2.5 MG/3ML) 0.083% nebulizer solution Take 3 mLs (2.5 mg total) by nebulization every 4 (four) hours as needed for wheezing or shortness  of breath. 10/15/14   Phillipe Clemon Pisciotta, PA-C  benzonatate (TESSALON) 100 MG capsule Take 2 capsules (200 mg total) by mouth 2 (two) times daily as needed for cough. 11/02/15   Barrett Henle, PA-C  betamethasone dipropionate (DIPROLENE) 0.05 % ointment Apply topically 2 (two) times daily. 11/02/15   Barrett Henle, PA-C  cephALEXin (KEFLEX) 500 MG capsule Take 1 capsule (500 mg total) by mouth 4 (four) times daily. 04/04/15   [name], PA-C  fluticasone (FLOVENT HFA) 220 MCG/ACT inhaler Inhale 2 puffs into the lungs 2  (two) times daily. 03/03/15   Carney Living, MD  HYDROcodone-acetaminophen (NORCO/VICODIN) 5-325 MG tablet Take 2 tablets by mouth every 4 (four) hours as needed. 04/04/15   [name], PA-C  hydrOXYzine (ATARAX/VISTARIL) 10 MG tablet Take 1 tablet (10 mg total) by mouth every 6 (six) hours as needed for itching. 11/02/15   Barrett Henle, PA-C  loratadine (CLARITIN) 10 MG tablet TAKE 1 TABLET BY MOUTH EVERY DAY Patient not taking: Reported on 11/03/2014 11/20/13   Carney Living, MD  mometasone-formoterol (DULERA) 200-5 MCG/ACT AERO Inhale 2 puffs into the lungs 2 (two) times daily. 12/03/14   Nani Ravens, MD  montelukast (SINGULAIR) 10 MG tablet Take 1 tablet (10 mg total) by mouth at bedtime. Patient not taking: Reported on 11/03/2014 09/11/13   Carney Living, MD  mupirocin ointment (BACTROBAN) 2 % Place 1 application into the nose 2 (two) times daily. Place on wound 04/04/15   [name], PA-C  nicotine polacrilex (NICORETTE) 4 MG gum Take 1 each (4 mg total) by mouth as needed for smoking cessation. 12/03/14   Nani Ravens, MD  oxymetazoline [facility] NASAL SPRAY) 0.05 % nasal spray Place 1 spray into both nostrils 2 (two) times daily. Do not use for more than 3 days to prevent rebound rhinorrhea. 11/02/15   Barrett Henle, PA-C  predniSONE (STERAPRED UNI-PAK 21 TAB) 10 MG (21) TBPK tablet Take 1 tablet (10 mg total) by mouth daily. Take 6 tabs by mouth daily  for 2 days, then 5 tabs for 2 days, then 4 tabs for 2 days, then 3 tabs for 2 days, 2 tabs for 2 days, then 1 tab by mouth daily for 2 days 11/02/15   Satira Sark Tyliyah Mcmeekin, PA-C  PROAIR HFA 108 [phone] BASE) MCG/ACT inhaler INHALE 2 PUFFS BY MOUTH EVERY 2 HOURS AS NEEDED FOR SHORTNESS OF BREATH 12/07/14   Nani Ravens, MD  sertraline (ZOLOFT) 50 MG tablet Take 1 tablet (50 mg total) by mouth daily. 05/28/15   Nani Ravens, MD  triamcinolone ointment (KENALOG) 0.5 % Apply 1 application  topically daily. 05/28/15   Nani Ravens, MD    Family History Family History  Problem Relation Age of Onset  . Healthy Mother   . Anxiety disorder Mother   . Healthy Father   . Stroke Maternal Grandmother   . Stroke Paternal Grandfather   . Bipolar disorder Neg Hx   . Depression Neg Hx     Social History Social History  Substance Use Topics  . Smoking status: Current Every Day Smoker    Packs/day: 1.00    Years: 5.00    Types: Cigarettes    Last attempt to quit: 06/22/2010  . Smokeless tobacco: Never Used  . Alcohol use Yes     Comment: daily up until mid December 2015. Since then it is down to 1 beer a week  Allergies   Doxycycline   Review of Systems Review of Systems  Constitutional: Negative for fever.  HENT: Positive for congestion.   Respiratory: Positive for cough, chest tightness and wheezing. Negative for shortness of breath.   Cardiovascular: Negative for chest pain.  Gastrointestinal: Negative for diarrhea and vomiting.  Skin: Positive for rash.     Physical Exam Updated Vital Signs BP 118/79 (BP Location: Right Arm)   Pulse 91   Temp 98.5 F (36.9 C) (Oral)   Resp 18   LMP 10/26/2015 (Within Days) Comment: tubes tied  SpO2 100%   Physical Exam  Constitutional: She is oriented to person, place, and time. She appears well-developed and well-nourished. No distress.  HENT:  Head: Normocephalic and atraumatic.  Mouth/Throat: Uvula is midline, oropharynx is clear and moist and mucous membranes are normal. No oral lesions. No oropharyngeal exudate, posterior oropharyngeal edema, posterior oropharyngeal erythema or tonsillar abscesses. No tonsillar exudate.  No oral lesions  Eyes: Conjunctivae and EOM are normal. Right eye exhibits no discharge. Left eye exhibits no discharge. No scleral icterus.  Neck: Normal range of motion. Neck supple.  Cardiovascular: Normal rate, regular rhythm, normal heart sounds and intact distal pulses.     Pulmonary/Chest: Effort normal. No respiratory distress. She has wheezes. She has no rales. She exhibits no tenderness.  Mild diffuse end expiratory wheezing.   Abdominal: Soft. Bowel sounds are normal. There is no tenderness.  Musculoskeletal: Normal range of motion. She exhibits no edema.  Lymphadenopathy:    She has no cervical adenopathy.  Neurological: She is alert and oriented to person, place, and time.  Skin: Skin is warm and dry. Capillary refill takes less than 2 seconds. Rash noted. She is not diaphoretic.  Diffuse erythematous papular rash with extensive excoriations noted to bilateral legs, arms, back, chest, and abdomen. No pustules, vesicles, bulla, or drainage. No lesions on palms or soles.   Nursing note and vitals reviewed.    ED Treatments / Results  DIAGNOSTIC STUDIES: Oxygen Saturation is 100% on room air, normal, adequate by my interpretation.    COORDINATION OF CARE: 3:13 PM Discussed treatment plan with pt at bedside and pt agreed to plan.   Labs (all labs ordered are listed, but only abnormal results are displayed) Labs Reviewed - No data to display  EKG  EKG Interpretation None       Radiology Dg Chest 2 View  Result Date: 11/02/2015 CLINICAL DATA:  Shortness of breath, cough. EXAM: CHEST  2 VIEW COMPARISON:  Radiographs of November 03, 2014. FINDINGS: The heart size and mediastinal contours are within normal limits. Both lungs are clear. No pneumothorax or pleural effusion is noted. The visualized skeletal structures are unremarkable. IMPRESSION: No active cardiopulmonary disease. Electronically Signed   By: Lupita RaiderJames  Green Jr, [name]   On: 11/02/2015 15:37    Procedures Procedures (including critical care time)  Medications Ordered in ED Medications  albuterol (PROVENTIL) (2.5 MG/3ML) 0.083% nebulizer solution 5 mg (5 mg Nebulization Given 11/02/15 1555)  predniSONE (DELTASONE) tablet 60 mg (60 mg Oral Given 11/02/15 1554)  albuterol (PROVENTIL  HFA;VENTOLIN HFA) 108 (90 Base) MCG/ACT inhaler 2 puff (2 puffs Inhalation Given 11/02/15 1556)     Initial Impression / Assessment and Plan / ED Course  I have reviewed the triage vital signs and the nursing notes.  Pertinent labs & imaging results that were available during my care of the patient were reviewed by me and considered in my medical decision making (see chart for details).  Clinical Course    Pt CXR negative for acute infiltrate. Patients symptoms are consistent with URI, likely viral etiology. Patient given neb treatment and steroids in the ED. Albuterol inhaler given in the ED to take home. Discussed that antibiotics are not indicated for viral infections. Pt will be discharged with symptomatic treatment.  Verbalizes understanding and is agreeable with plan. Pt is hemodynamically stable & in NAD prior to dc.   Rash consistent with moderate/severe eczema. Patient denies any difficulty breathing or swallowing.  Pt has a patent airway without stridor and is handling secretions without difficulty; no angioedema. No blisters, no pustules, no warmth, no draining sinus tracts, no superficial abscesses, no bullous impetigo, no vesicles, no desquamation, no target lesions with dusky purpura or a central bulla. Not tender to touch. No concern for superimposed infection. No concern for SJS, TEN, TSS, tick borne illness, syphilis or other life-threatening condition. Will discharge home with oral steroids, steroid ointment and vistaril recommend Benadryl as needed for pruritis.    Final Clinical Impressions(s) / ED Diagnoses   Final diagnoses:  Eczema, unspecified type  Viral upper respiratory tract infection    New Prescriptions New Prescriptions   BENZONATATE (TESSALON) 100 MG CAPSULE    Take 2 capsules (200 mg total) by mouth 2 (two) times daily as needed for cough.   BETAMETHASONE DIPROPIONATE (DIPROLENE) 0.05 % OINTMENT    Apply topically 2 (two) times daily.   HYDROXYZINE  (ATARAX/VISTARIL) 10 MG TABLET    Take 1 tablet (10 mg total) by mouth every 6 (six) hours as needed for itching.   OXYMETAZOLINE (AFRIN NASAL SPRAY) 0.05 % NASAL SPRAY    Place 1 spray into both nostrils 2 (two) times daily. Do not use for more than 3 days to prevent rebound rhinorrhea.   PREDNISONE (STERAPRED UNI-PAK 21 TAB) 10 MG (21) TBPK TABLET    Take 1 tablet (10 mg total) by mouth daily. Take 6 tabs by mouth daily  for 2 days, then 5 tabs for 2 days, then 4 tabs for 2 days, then 3 tabs for 2 days, 2 tabs for 2 days, then 1 tab by mouth daily for 2 days   I personally performed the services described in this documentation, which was scribed in my presence. The recorded information has been reviewed and is accurate.    Satira Sarkicole Elizabeth Websters CrossingNadeau, [place]PA-C 11/02/15 1611    Nelva Nayobert Beaton, MD 11/13/15 2144

## 2015-11-02 NOTE — ED Triage Notes (Signed)
Pt reports eczema X1 month. She reports she has been using steroid cream without relief. Redness noted to the abdomen. Pt also reports asthma. Lung sounds clear to auscultation. No resp distress noted.

## 2015-12-17 ENCOUNTER — Other Ambulatory Visit: Payer: Self-pay | Admitting: Family Medicine

## 2015-12-18 ENCOUNTER — Encounter (HOSPITAL_COMMUNITY): Payer: Self-pay | Admitting: *Deleted

## 2015-12-18 ENCOUNTER — Emergency Department (HOSPITAL_COMMUNITY)
Admission: EM | Admit: 2015-12-18 | Discharge: 2015-12-18 | Disposition: A | Discharge status: Home / Self Care | Payer: 59 | Attending: Emergency Medicine | Admitting: Emergency Medicine

## 2015-12-18 DIAGNOSIS — L309 Dermatitis, unspecified: Secondary | ICD-10-CM | POA: Insufficient documentation

## 2015-12-18 DIAGNOSIS — J45909 Unspecified asthma, uncomplicated: Secondary | ICD-10-CM | POA: Insufficient documentation

## 2015-12-18 DIAGNOSIS — F17210 Nicotine dependence, cigarettes, uncomplicated: Secondary | ICD-10-CM | POA: Insufficient documentation

## 2015-12-18 MED ORDER — PREDNISONE 20 MG PO TABS: 40.0000 mg | ORAL_TABLET | Freq: Every day | ORAL | 0 refills | Status: DC

## 2015-12-18 MED ORDER — DEXAMETHASONE SODIUM PHOSPHATE 10 MG/ML IJ SOLN
10.0000 mg | Freq: Once | INTRAMUSCULAR | Status: AC
  Administered 2015-12-18: 10 mg via INTRAMUSCULAR
  Filled 2015-12-18: qty 1

## 2015-12-18 NOTE — ED Triage Notes (Signed)
The pt is c/o a rash all over her body for one month itching   lmp nov 1st

## 2015-12-18 NOTE — ED Provider Notes (Signed)
[facility] Provider Note   CSN: [id number] Arrival date & time: 12/18/15  2034  By signing my name below, I, Linna DarnerRussell Turner, attest that this documentation has been prepared under the direction and in the presence of Roxy Horsemanobert Candus Braud, PA-C. Electronically Signed: Linna Darnerussell Turner, Scribe. 12/18/2015. 9:18 PM.  History   Chief Complaint Chief Complaint  Patient presents with  . Rash    The history is provided by the patient. No language interpreter was used.     [name] [name] is a 30 y.o. female with PMHx significant for eczema and asthma who presents to the Emergency Department with a chief complaint of intermittent, erythematous, generalized rash for the last month and a half. Pt states her rash is pruritic and painful. She states the pain is worse with any contact to her skin. She was seen here on 11/02/15 when her rash first presented; she was prescribed prednisone for 12 days and states this resolved her rash, but it presented again when she finished the treatment. Pt believes she is having an eczema flare up but has never experienced a rash of the same severity in the past. Pt states she does not have a dermatologist (no health insurance). She denies numbness, weakness, fever, chills, SOB, or any other associated symptoms.  Past Medical History:  Diagnosis Date  . Admission for sterilization 06/12/2013  . Asthma   . Breast abscess   . Depression    HX of - not on med n, doing well.  . Eczema   . Family history of anesthesia complication    Grandmother has difficulty waskin up.  . Pneumonia    HX only -years ago has had a few times.  . Pregnant state, incidental   . S/P tubal ligation 06/13/2013  . SVD (spontaneous vaginal delivery)    x 2    Patient Active Problem List   Diagnosis Date Noted  . Tobacco use disorder 12/07/2014  . Major depressive disorder, recurrent episode, moderate (HCC) 01/13/2014  . GAD (generalized anxiety disorder) 01/13/2014  . Tinea  pedis of both feet 09/10/2013  . S/P tubal ligation 06/13/2013  . Admission for sterilization 06/12/2013  . Rash and nonspecific skin eruption 06/09/2013  . Neck discomfort 02/26/2013  . Tachycardia 10/23/2012  . Allergic conjunctivitis of both eyes and rhinitis 10/14/2012  . Mastitis, Left. 11/27/2011  . Depression 03/24/2011  . Asthma 03/01/2006  . Eczema 03/01/2006    Past Surgical History:  Procedure Laterality Date  . INCISE AND DRAIN ABCESS     left breast x 2  . INCISION AND DRAINAGE ABSCESS Left 02/17/2012   Procedure:  DRAINAGEof recurrent breast abscess;  Surgeon: Shelly Rubensteinouglas A Blackman, MD;  Location: [facility] OR;  Service: General;  Laterality: Left;  . LAPAROSCOPIC TUBAL LIGATION Bilateral 06/13/2013   Procedure: LAPAROSCOPIC TUBAL LIGATION;  Surgeon: Sherian ReinJody Bovard-Stuckert, MD;  Location: [facility];  Service: Gynecology;  Laterality: Bilateral;    OB History    Gravida Para Term Preterm AB Living   2 2 1 1   2    SAB TAB Ectopic Multiple Live Births           2       Home Medications    Prior to Admission medications   Medication Sig Start Date End Date Taking? Authorizing Provider  albuterol (PROVENTIL) (2.5 MG/3ML) 0.083% nebulizer solution Take 3 mLs (2.5 mg total) by nebulization every 4 (four) hours as needed for wheezing or shortness of breath. 10/15/14   [name], PA-C  benzonatate (  TESSALON) 100 MG capsule Take 2 capsules (200 mg total) by mouth 2 (two) times daily as needed for cough. 11/02/15   Barrett Henle, PA-C  betamethasone dipropionate (DIPROLENE) 0.05 % ointment Apply topically 2 (two) times daily. 11/02/15   Barrett Henle, PA-C  cephALEXin (KEFLEX) 500 MG capsule Take 1 capsule (500 mg total) by mouth 4 (four) times daily. 04/04/15   [name], PA-C  fluticasone (FLOVENT HFA) 220 MCG/ACT inhaler Inhale 2 puffs into the lungs 2 (two) times daily. 03/03/15   Carney Living, MD  HYDROcodone-acetaminophen (NORCO/VICODIN)  5-325 MG tablet Take 2 tablets by mouth every 4 (four) hours as needed. 04/04/15   [name], PA-C  hydrOXYzine (ATARAX/VISTARIL) 10 MG tablet Take 1 tablet (10 mg total) by mouth every 6 (six) hours as needed for itching. 11/02/15   Barrett Henle, PA-C  loratadine (CLARITIN) 10 MG tablet TAKE 1 TABLET BY MOUTH EVERY DAY Patient not taking: Reported on 11/03/2014 11/20/13   Carney Living, MD  mometasone-formoterol (DULERA) 200-5 MCG/ACT AERO Inhale 2 puffs into the lungs 2 (two) times daily. 12/03/14   Nani Ravens, MD  montelukast (SINGULAIR) 10 MG tablet Take 1 tablet (10 mg total) by mouth at bedtime. Patient not taking: Reported on 11/03/2014 09/11/13   Carney Living, MD  mupirocin ointment (BACTROBAN) 2 % Place 1 application into the nose 2 (two) times daily. Place on wound 04/04/15   [name], PA-C  nicotine polacrilex (NICORETTE) 4 MG gum Take 1 each (4 mg total) by mouth as needed for smoking cessation. 12/03/14   Nani Ravens, MD  oxymetazoline [facility] NASAL SPRAY) 0.05 % nasal spray Place 1 spray into both nostrils 2 (two) times daily. Do not use for more than 3 days to prevent rebound rhinorrhea. 11/02/15   Barrett Henle, PA-C  predniSONE (STERAPRED UNI-PAK 21 TAB) 10 MG (21) TBPK tablet Take 1 tablet (10 mg total) by mouth daily. Take 6 tabs by mouth daily  for 2 days, then 5 tabs for 2 days, then 4 tabs for 2 days, then 3 tabs for 2 days, 2 tabs for 2 days, then 1 tab by mouth daily for 2 days 11/02/15   Satira Sark Nadeau, PA-C  PROAIR HFA [phone] BASE) MCG/ACT inhaler INHALE 2 PUFFS BY MOUTH EVERY 2 HOURS AS NEEDED FOR SHORTNESS OF BREATH 12/07/14   Nani Ravens, MD  sertraline (ZOLOFT) 50 MG tablet Take 1 tablet (50 mg total) by mouth daily. 05/28/15   Nani Ravens, MD  triamcinolone ointment (KENALOG) 0.5 % Apply 1 application topically daily. 05/28/15   Nani Ravens, MD    Family History Family History  Problem  Relation Age of Onset  . Healthy Mother   . Anxiety disorder Mother   . Healthy Father   . Stroke Maternal Grandmother   . Stroke Paternal Grandfather   . Bipolar disorder Neg Hx   . Depression Neg Hx     Social History Social History  Substance Use Topics  . Smoking status: Current Every Day Smoker    Packs/day: 1.00    Years: 5.00    Types: Cigarettes    Last attempt to quit: 06/22/2010  . Smokeless tobacco: Never Used  . Alcohol use Yes     Comment: daily up until mid December 2015. Since then it is down to 1 beer a week     Allergies   Doxycycline   Review of Systems Review  of Systems  Constitutional: Negative for chills and fever.  Respiratory: Negative for shortness of breath.   Musculoskeletal: Positive for myalgias (secondary to rash).  Skin: Positive for rash (generalized).  Neurological: Negative for weakness and numbness.     Physical Exam Updated Vital Signs BP 132/80   Pulse 101   Temp 98.4 F (36.9 C) (Oral)   Resp 16   LMP 11/08/2015   SpO2 99%   Physical Exam  Constitutional: She is oriented to person, place, and time. She appears well-developed and well-nourished. No distress.  HENT:  Head: Normocephalic and atraumatic.  Eyes: Conjunctivae and EOM are normal.  Neck: Neck supple. No tracheal deviation present.  Cardiovascular: Normal rate, regular rhythm, normal heart sounds and intact distal pulses.  Exam reveals no gallop and no friction rub.   No murmur heard. Pulmonary/Chest: Effort normal and breath sounds normal. No respiratory distress. She has no wheezes. She has no rales.  Lungs CTA.  Musculoskeletal: Normal range of motion.  Neurological: She is alert and oriented to person, place, and time.  Skin: Skin is warm and dry. Rash noted.  Dry, scaly, maculopapular rash involving the trunk and extremities. No evidence of superimposed infection.  Psychiatric: She has a normal mood and affect. Her behavior is normal.  Nursing note and  vitals reviewed.    ED Treatments / Results  Labs (all labs ordered are listed, but only abnormal results are displayed) Labs Reviewed - No data to display  EKG  EKG Interpretation None       Radiology No results found.  Procedures Procedures (including critical care time)  DIAGNOSTIC STUDIES: Oxygen Saturation is 99% on RA, normal by my interpretation.    COORDINATION OF CARE: 9:25 PM Discussed treatment plan with pt at bedside and pt agreed to plan.  Medications Ordered in ED Medications - No data to display   Initial Impression / Assessment and Plan / ED Course  I have reviewed the triage vital signs and the nursing notes.  Pertinent labs & imaging results that were available during my care of the patient were reviewed by me and considered in my medical decision making (see chart for details).  Clinical Course     Symptoms seem consistent with eczema.  She needs to see a dermatologist.  I have strongly advised this.  She states that she cannot afford this.  There is no evidence of anaphylactic reaction, no evidence of serious life threatening rash.  Will treat with prednisone taper.  Final Clinical Impressions(s) / ED Diagnoses      Final diagnoses:  Eczema, unspecified type    New Prescriptions Discharge Medication List as of 12/18/2015  9:30 PM    START taking these medications   Details  predniSONE (DELTASONE) 20 MG tablet Take 2 tablets (40 mg total) by mouth daily. Take 60 mg by mouth for 5 days, 40 mg by mouth daily for 5 days, then 20mg  by mouth daily for 5 days, then 10mg  daily for 5 days, Starting Sat 12/18/2015, Print       I personally performed the services described in this documentation, which was scribed in my presence. The recorded information has been reviewed and is accurate.       Roxy HorsemanRobert Shadrach Bartunek, PA-C 12/18/15 [id number]    Gwyneth SproutWhitney Plunkett, MD 12/19/15 2034

## 2015-12-18 NOTE — ED Notes (Signed)
Pt presents with full body rash.  Sts she thinks her eczema is flaring up.  This happened about a month ago.  She was put on prednisone, which resolved it, but as soon as she stopped it, the rash came back.  Right now, she has pain and swelling in her legs.  8/10.

## 2016-02-18 ENCOUNTER — Telehealth: Payer: Self-pay | Admitting: Family Medicine

## 2016-02-18 NOTE — Telephone Encounter (Signed)
Home number is invalid. Pt wasn't around, spoke to her mother and I asked mom to let pt know she should schedule an appt with her PCP anytime. Pt reportedly no longer has insurance according to mom. -Mesha Guinyard

## 2016-02-29 ENCOUNTER — Emergency Department (HOSPITAL_COMMUNITY)
Admission: EM | Admit: 2016-02-29 | Discharge: 2016-02-29 | Disposition: A | Discharge status: Home / Self Care | Payer: Self-pay | Attending: Emergency Medicine | Admitting: Emergency Medicine

## 2016-02-29 ENCOUNTER — Encounter (HOSPITAL_COMMUNITY): Payer: Self-pay | Admitting: Emergency Medicine

## 2016-02-29 ENCOUNTER — Emergency Department (HOSPITAL_COMMUNITY): Payer: Self-pay

## 2016-02-29 DIAGNOSIS — R0789 Other chest pain: Secondary | ICD-10-CM

## 2016-02-29 DIAGNOSIS — Z79899 Other long term (current) drug therapy: Secondary | ICD-10-CM | POA: Insufficient documentation

## 2016-02-29 DIAGNOSIS — J45909 Unspecified asthma, uncomplicated: Secondary | ICD-10-CM | POA: Insufficient documentation

## 2016-02-29 DIAGNOSIS — F17210 Nicotine dependence, cigarettes, uncomplicated: Secondary | ICD-10-CM | POA: Insufficient documentation

## 2016-02-29 LAB — COMPREHENSIVE METABOLIC PANEL
ALBUMIN: 4.1 g/dL (ref 3.5–5.0)
ALT: 13 U/L — AB (ref 14–54)
AST: 17 U/L (ref 15–41)
Alkaline Phosphatase: 49 U/L (ref 38–126)
Anion gap: 9 (ref 5–15)
BILIRUBIN TOTAL: 0.8 mg/dL (ref 0.3–1.2)
BUN: 7 mg/dL (ref 6–20)
CHLORIDE: 104 mmol/L (ref 101–111)
CO2: 25 mmol/L (ref 22–32)
CREATININE: 0.67 mg/dL (ref 0.44–1.00)
Calcium: 9.3 mg/dL (ref 8.9–10.3)
GFR calc Af Amer: >60 mL/min (ref >60)
GLUCOSE: 111 mg/dL — AB (ref 65–99)
POTASSIUM: 3.9 mmol/L (ref 3.5–5.1)
Sodium: 138 mmol/L (ref 135–145)
Total Protein: 6.7 g/dL (ref 6.5–8.1)

## 2016-02-29 LAB — CBC
HEMATOCRIT: 44.6 % (ref 36.0–46.0)
Hemoglobin: 15.3 g/dL — ABNORMAL HIGH (ref 12.0–15.0)
MCH: 33.7 pg (ref 26.0–34.0)
MCHC: 34.3 g/dL (ref 30.0–36.0)
MCV: 98.2 fL (ref 78.0–100.0)
PLATELETS: 339 10*3/uL (ref 150–400)
RBC: 4.54 MIL/uL (ref 3.87–5.11)
RDW: 12.7 % (ref 11.5–15.5)
WBC: 9.2 10*3/uL (ref 4.0–10.5)

## 2016-02-29 LAB — I-STAT TROPONIN, ED: TROPONIN I, POC: 0 ng/mL (ref 0.00–0.08)

## 2016-02-29 MED ORDER — ALBUTEROL SULFATE (2.5 MG/3ML) 0.083% IN NEBU
5.0000 mg | INHALATION_SOLUTION | Freq: Once | RESPIRATORY_TRACT | Status: AC
  Administered 2016-02-29: 5 mg via RESPIRATORY_TRACT
  Filled 2016-02-29: qty 6

## 2016-02-29 MED ORDER — PREDNISONE 20 MG PO TABS
60.0000 mg | ORAL_TABLET | ORAL | Status: AC
  Administered 2016-02-29: 60 mg via ORAL
  Filled 2016-02-29: qty 3

## 2016-02-29 MED ORDER — ALBUTEROL SULFATE HFA 108 (90 BASE) MCG/ACT IN AERS
1.0000 | INHALATION_SPRAY | RESPIRATORY_TRACT | Status: DC | PRN
  Administered 2016-02-29: 1 via RESPIRATORY_TRACT
  Filled 2016-02-29: qty 6.7

## 2016-02-29 MED ORDER — PREDNISONE 20 MG PO TABS: 40.0000 mg | ORAL_TABLET | Freq: Every day | ORAL | 0 refills | Status: DC

## 2016-02-29 NOTE — ED Triage Notes (Addendum)
Pt reports left sided chest pain that began after she started coughing up mucus today. Pt reports she is on tamiflu, has been on it since Thursday. Pt reports some nausea as well, denies sob.

## 2016-02-29 NOTE — ED Provider Notes (Signed)
[facility]-EMERGENCY DEPT Provider Note   CSN: [id number] Arrival date & time: 02/29/16  1116  By signing my name below, I, Sonum Patel, attest that this documentation has been prepared under the direction and in the presence of Gerhard Munchobert Richel Millspaugh, MD. Electronically Signed: Sonum Patel, Neurosurgeoncribe. 02/29/16. 12:47 PM.  History   Chief Complaint Chief Complaint  Patient presents with  . Chest Pain  . Cough    The history is provided by the patient. No language interpreter was used.     HPI Comments: Manuela NeptuneCourtney Krall is a 31 y.o. female with past medical history of asthma who presents to the Emergency Department complaining of persistent left sided chest pain that began this morning. She states at times the pain moves to the right rib area. She has experienced fatigue, dizziness, fever for the last week and has been taking Tamiflu without significant relief.  She has taken ibuprofen for a HA without relief of any symptoms. She denies cough, SOB, vomiting, confusion. 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   Past Medical History:  Diagnosis Date  . Admission for sterilization 06/12/2013  . Asthma   . Breast abscess   . Depression    HX of - not on med n, doing well.  . Eczema   . Family history of anesthesia complication    Grandmother has difficulty waskin up.  . Pneumonia    HX only -years ago has had a few times.  . Pregnant state, incidental   . S/P tubal ligation 06/13/2013  . SVD (spontaneous vaginal delivery)    x 2    Patient Active Problem List   Diagnosis Date Noted  . Tobacco use disorder 12/07/2014  . Major depressive disorder, recurrent episode, moderate (HCC) 01/13/2014  . GAD (generalized anxiety disorder) 01/13/2014  . Tinea pedis of both feet 09/10/2013  . S/P tubal ligation 06/13/2013  . Admission for sterilization 06/12/2013  . Rash and nonspecific skin eruption 06/09/2013  . Neck discomfort 02/26/2013  . Tachycardia 10/23/2012  . Allergic conjunctivitis of both  eyes and rhinitis 10/14/2012  . Mastitis, Left. 11/27/2011  . Depression 03/24/2011  . Asthma 03/01/2006  . Eczema 03/01/2006    Past Surgical History:  Procedure Laterality Date  . INCISE AND DRAIN ABCESS     left breast x 2  . INCISION AND DRAINAGE ABSCESS Left 02/17/2012   Procedure:  DRAINAGEof recurrent breast abscess;  Surgeon: Shelly Rubensteinouglas A Blackman, MD;  Location: [facility] OR;  Service: General;  Laterality: Left;  . LAPAROSCOPIC TUBAL LIGATION Bilateral 06/13/2013   Procedure: LAPAROSCOPIC TUBAL LIGATION;  Surgeon: Sherian ReinJody Bovard-Stuckert, MD;  Location: [facility];  Service: Gynecology;  Laterality: Bilateral;    OB History    Gravida Para Term Preterm AB Living   2 2 1 1   2    SAB TAB Ectopic Multiple Live Births           2       Home Medications    Prior to Admission medications   Medication Sig Start Date End Date Taking? Authorizing Provider  albuterol (PROVENTIL) (2.5 MG/3ML) 0.083% nebulizer solution Take 3 mLs (2.5 mg total) by nebulization every 4 (four) hours as needed for wheezing or shortness of breath. 10/15/14   [name], PA-C  benzonatate (TESSALON) 100 MG capsule Take 2 capsules (200 mg total) by mouth 2 (two) times daily as needed for cough. 11/02/15   Barrett HenleNicole Elizabeth Nadeau, PA-C  betamethasone dipropionate (DIPROLENE) 0.05 % ointment Apply topically 2 (two) times daily. 11/02/15  Satira Sark Nadeau, PA-C  cephALEXin (KEFLEX) 500 MG capsule Take 1 capsule (500 mg total) by mouth 4 (four) times daily. 04/04/15   [name], PA-C  fluticasone (FLOVENT HFA) 220 MCG/ACT inhaler Inhale 2 puffs into the lungs 2 (two) times daily. 03/03/15   Carney Living, MD  HYDROcodone-acetaminophen (NORCO/VICODIN) 5-325 MG tablet Take 2 tablets by mouth every 4 (four) hours as needed. 04/04/15   [name], PA-C  hydrOXYzine (ATARAX/VISTARIL) 10 MG tablet Take 1 tablet (10 mg total) by mouth every 6 (six) hours as needed for itching. 11/02/15   Barrett Henle, PA-C  loratadine (CLARITIN) 10 MG tablet TAKE 1 TABLET BY MOUTH EVERY DAY Patient not taking: Reported on 11/03/2014 11/20/13   Carney Living, MD  mometasone-formoterol (DULERA) 200-5 MCG/ACT AERO Inhale 2 puffs into the lungs 2 (two) times daily. 12/03/14   Nani Ravens, MD  montelukast (SINGULAIR) 10 MG tablet Take 1 tablet (10 mg total) by mouth at bedtime. Patient not taking: Reported on 11/03/2014 09/11/13   Carney Living, MD  mupirocin ointment (BACTROBAN) 2 % Place 1 application into the nose 2 (two) times daily. Place on wound 04/04/15   [name], PA-C  nicotine polacrilex (NICORETTE) 4 MG gum Take 1 each (4 mg total) by mouth as needed for smoking cessation. 12/03/14   Nani Ravens, MD  oxymetazoline [facility] NASAL SPRAY) 0.05 % nasal spray Place 1 spray into both nostrils 2 (two) times daily. Do not use for more than 3 days to prevent rebound rhinorrhea. 11/02/15   Barrett Henle, PA-C  predniSONE (DELTASONE) 20 MG tablet Take 2 tablets (40 mg total) by mouth daily. Take 60 mg by mouth for 5 days, 40 mg by mouth daily for 5 days, then 20mg  by mouth daily for 5 days, then 10mg  daily for 5 days 12/18/15   Roxy Horseman, PA-C  PROAIR HFA 108 (90 Base) MCG/ACT inhaler INHALE 2 PUFFS BY MOUTH EVERY 2 HOURS AS NEEDED SHORTNESS OF BREATH 12/20/15   Carney Living, MD  sertraline (ZOLOFT) 50 MG tablet Take 1 tablet (50 mg total) by mouth daily. 05/28/15   Nani Ravens, MD  triamcinolone ointment (KENALOG) 0.5 % Apply 1 application topically daily. 05/28/15   Nani Ravens, MD    Family History Family History  Problem Relation Age of Onset  . Healthy Mother   . Anxiety disorder Mother   . Healthy Father   . Stroke Maternal Grandmother   . Stroke Paternal Grandfather   . Bipolar disorder Neg Hx   . Depression Neg Hx     Social History Social History  Substance Use Topics  . Smoking status: Current Every Day Smoker    Packs/day:  1.00    Years: 5.00    Types: Cigarettes    Last attempt to quit: 06/22/2010  . Smokeless tobacco: Never Used  . Alcohol use Yes     Comment: daily up until mid December 2015. Since then it is down to 1 beer a week     Allergies   Doxycycline   Review of Systems Review of Systems  Constitutional:       Per HPI, otherwise negative  HENT:       Per HPI, otherwise negative  Respiratory:       Per HPI, otherwise negative  Cardiovascular:       Per HPI, otherwise negative  Gastrointestinal: Negative for vomiting.  Endocrine:  Negative aside from HPI  Genitourinary:       Neg aside from HPI   Musculoskeletal:       Per HPI, otherwise negative  Skin: Negative.   Neurological: Negative for syncope.     Physical Exam Updated Vital Signs BP 123/71   Pulse 102   Temp 98.5 F (36.9 C)   Resp 18   LMP  (Exact Date) Comment: Pt states she has tubal ligation  SpO2 98%   Physical Exam  Constitutional: She is oriented to person, place, and time. She appears well-developed and well-nourished. No distress.  HENT:  Head: Normocephalic and atraumatic.  Eyes: Conjunctivae and EOM are normal.  Cardiovascular: Normal rate and regular rhythm.   Pulmonary/Chest: Effort normal. No stridor. No respiratory distress. She has decreased breath sounds.  Abdominal: She exhibits no distension.  Musculoskeletal: She exhibits no edema.  Neurological: She is alert and oriented to person, place, and time. No cranial nerve deficit.  Skin: Skin is warm and dry.  Psychiatric: She has a normal mood and affect.  Nursing note and vitals reviewed.    ED Treatments / Results  DIAGNOSTIC STUDIES: Oxygen Saturation is 98% on RA, normal by my interpretation.    COORDINATION OF CARE: 12:28 PM Discussed treatment plan with pt at bedside and pt agreed to plan.   Labs (all labs ordered are listed, but only abnormal results are displayed) Labs Reviewed  CBC - Abnormal; Notable for the following:        Result Value   Hemoglobin 15.3 (*)    All other components within normal limits  COMPREHENSIVE METABOLIC PANEL - Abnormal; Notable for the following:    Glucose, Bld 111 (*)    ALT 13 (*)    All other components within normal limits  I-STAT TROPOININ, ED    EKG  EKG Interpretation  Date/Time:  Tuesday February 29 2016 12:51:23 EST Ventricular Rate:  84 PR Interval:  178 QRS Duration: 82 QT Interval:  386 QTC Calculation: 456 R Axis:   90 Text Interpretation:  Normal sinus rhythm Rightward axis No significant change since last tracing Abnormal ekg Confirmed by Gerhard Munch  MD [phone]) on 02/29/2016 2:02:14 PM       Radiology Dg Chest 2 View  Result Date: 02/29/2016 CLINICAL DATA:  Left-sided chest pain, shortness of breath for 2 days, dizziness and nausea, smoking history EXAM: CHEST  2 VIEW COMPARISON:  Chest x-ray of 11/02/2015 FINDINGS: No active infiltrate or effusion is seen. Mediastinal and hilar contours are unremarkable. There is some peribronchial thickening most consistent with bronchitis in this patient with smoking history. The heart is within normal limits in size. No bony abnormality is seen. IMPRESSION: 1. No active lung disease. 2. Peribronchial thickening may indicate bronchitis in this patient with smoking history. Electronically Signed   By: Dwyane Dee [name]   On: 02/29/2016 12:02    Procedures Procedures (including critical care time)  Medications Ordered in ED Medications  predniSONE (DELTASONE) tablet 60 mg (60 mg Oral Given 02/29/16 1252)  albuterol (PROVENTIL) (2.5 MG/3ML) 0.083% nebulizer solution 5 mg (5 mg Nebulization Given 02/29/16 1251)     Initial Impression / Assessment and Plan / ED Course  I have reviewed the triage vital signs and the nursing notes.  Pertinent labs & imaging results that were available during my care of the patient were reviewed by me and considered in my medical decision making (see chart for details).  Following  neb treatment the patient notes some  improvement. We discussed all findings at length, need help with primary care, will also discuss today's reassuring evaluation, with no evidence for ACS, pneumonia, bacteremia, sepsis, pneumothorax or other acute pathology. Patient does have evidence for bronchitis, and given her smoking history, was started on a course of steroids, will follow up with her care.   Final Clinical Impressions(s) / ED Diagnoses   Final diagnoses:  Atypical chest pain    New Prescriptions New Prescriptions   PREDNISONE (DELTASONE) 20 MG TABLET    Take 2 tablets (40 mg total) by mouth daily with breakfast. For the next four days    I personally performed the services described in this documentation, which was scribed in my presence. The recorded information has been reviewed and is accurate.    Gerhard Munch, MD 02/29/16 [phone]

## 2016-02-29 NOTE — ED Notes (Signed)
Pt back from Xray, Phleb at bedside now obtaining sample.

## 2016-02-29 NOTE — ED Notes (Signed)
Pt states had fever two days last week but has not had fever since.

## 2016-02-29 NOTE — Discharge Instructions (Signed)
As discussed, your evaluation today has been largely reassuring.  But, it is important that you monitor your condition carefully, and do not hesitate to return to the ED if you develop new, or concerning changes in your condition. ? ?Otherwise, please follow-up with your physician for appropriate ongoing care. ? ?

## 2016-02-29 NOTE — ED Notes (Signed)
Patient currently in x ray.  Will transport to Pod C when complete

## 2016-03-05 ENCOUNTER — Emergency Department (HOSPITAL_COMMUNITY)
Admission: EM | Admit: 2016-03-05 | Discharge: 2016-03-05 | Disposition: A | Discharge status: Home / Self Care | Payer: Self-pay | Attending: Emergency Medicine | Admitting: Emergency Medicine

## 2016-03-05 ENCOUNTER — Emergency Department (HOSPITAL_COMMUNITY): Payer: Self-pay

## 2016-03-05 ENCOUNTER — Encounter (HOSPITAL_COMMUNITY): Payer: Self-pay

## 2016-03-05 DIAGNOSIS — R0602 Shortness of breath: Secondary | ICD-10-CM

## 2016-03-05 DIAGNOSIS — Z79899 Other long term (current) drug therapy: Secondary | ICD-10-CM | POA: Insufficient documentation

## 2016-03-05 DIAGNOSIS — F17210 Nicotine dependence, cigarettes, uncomplicated: Secondary | ICD-10-CM | POA: Insufficient documentation

## 2016-03-05 DIAGNOSIS — J40 Bronchitis, not specified as acute or chronic: Secondary | ICD-10-CM | POA: Insufficient documentation

## 2016-03-05 MED ORDER — PREDNISONE 20 MG PO TABS: 40.0000 mg | ORAL_TABLET | Freq: Every day | ORAL | 0 refills | Status: DC

## 2016-03-05 MED ORDER — AZITHROMYCIN 250 MG PO TABS: 250.0000 mg | ORAL_TABLET | Freq: Every day | ORAL | 0 refills | Status: DC

## 2016-03-05 MED ORDER — METHYLPREDNISOLONE SODIUM SUCC 125 MG IJ SOLR
125.0000 mg | Freq: Once | INTRAMUSCULAR | Status: AC
  Administered 2016-03-05: 125 mg via INTRAMUSCULAR
  Filled 2016-03-05: qty 2

## 2016-03-05 MED ORDER — ALBUTEROL SULFATE HFA 108 (90 BASE) MCG/ACT IN AERS: 1.0000 | INHALATION_SPRAY | Freq: Four times a day (QID) | RESPIRATORY_TRACT | 0 refills | Status: DC | PRN

## 2016-03-05 MED ORDER — IPRATROPIUM-ALBUTEROL 0.5-2.5 (3) MG/3ML IN SOLN
3.0000 mL | Freq: Once | RESPIRATORY_TRACT | Status: AC
  Administered 2016-03-05: 3 mL via RESPIRATORY_TRACT
  Filled 2016-03-05: qty 3

## 2016-03-05 NOTE — ED Provider Notes (Signed)
[facility]-EMER[facility]Y DEPT Provider Note   CSN: [id number] Arrival date & time: 03/05/16  4098  By signing my name below, I, Rosario Adie, attest that this documentation has been prepared under the direction and in the presence of [facility], PA-C.  Electronically Signed: Rosario Adie, ED Scribe. 03/05/16. 10:01 AM.  History   Chief Complaint Chief Complaint  Patient presents with  . cough, congestion, recheck flu   The history is provided by the patient. No language interpreter was used.    HPI Comments: [name] [name] is a 31 y.o. female with a h/o asthma, who presents to the Emergency Department complaining of persistent, gradually worsening non-productive cough and congestion beginning one week ago. She notes associated sore throat and subjective fever secondary to these issues. Pt was placed on a course of Tamiflu for her symptoms ~1.5 weeks ago which she finished the course of without relief of her symptoms. Pt was additionally seen in the ED ~1 week ago for this issue, and was prescribed four days of Prednisone. She took her last dosage of this today prior to coming into the ED, but her symptoms have continued to worsen despite this. CXR during her last ED visit was remarkable for peribronchial thickening, but without evidence of PNA. She denies trouble swallowing, or any other associated symptoms.   Past Medical History:  Diagnosis Date  . Admission for sterilization 06/12/2013  . Asthma   . Breast abscess   . Depression    HX of - not on med n, doing well.  . Eczema   . Family history of anesthesia complication    Grandmother has difficulty waskin up.  . Pneumonia    HX only -years ago has had a few times.  . Pregnant state, incidental   . S/P tubal ligation 06/13/2013  . SVD (spontaneous vaginal delivery)    x 2   Patient Active Problem List   Diagnosis Date Noted  . Tobacco use disorder 12/07/2014  . Major depressive disorder, recurrent episode, moderate  (HCC) 01/13/2014  . GAD (generalized anxiety disorder) 01/13/2014  . Tinea pedis of both feet 09/10/2013  . S/P tubal ligation 06/13/2013  . Admission for sterilization 06/12/2013  . Rash and nonspecific skin eruption 06/09/2013  . Neck discomfort 02/26/2013  . Tachycardia 10/23/2012  . Allergic conjunctivitis of both eyes and rhinitis 10/14/2012  . Mastitis, Left. 11/27/2011  . Depression 03/24/2011  . Asthma 03/01/2006  . Eczema 03/01/2006   Past Surgical History:  Procedure Laterality Date  . INCISE AND DRAIN ABCESS     left breast x 2  . INCISION AND DRAINAGE ABSCESS Left 02/17/2012   Procedure:  DRAINAGEof recurrent breast abscess;  Surgeon: Shelly Rubenstein, MD;  Location: [facility] OR;  Service: General;  Laterality: Left;  . LAPAROSCOPIC TUBAL LIGATION Bilateral 06/13/2013   Procedure: LAPAROSCOPIC TUBAL LIGATION;  Surgeon: Sherian Rein, MD;  Location: [facility] OR[facility];  Service: Gynecology;  Laterality: Bilateral;   OB History    Gravida Para Term Preterm AB Living   2 2 1 1   2    SAB TAB Ectopic Multiple Live Births           2     Home Medications    Prior to Admission medications   Medication Sig Start Date End Date Taking? Authorizing Provider  albuterol (PROVENTIL HFA;VENTOLIN HFA) 108 (90 Base) MCG/ACT inhaler Inhale 1-2 puffs into the lungs every 6 (six) hours as needed for wheezing or shortness of breath. 03/05/16   Marijean Niemann  Pilcher Hiren Peplinski, PA-C  azithromycin (ZITHROMAX) 250 MG tablet Take 1 tablet (250 mg total) by mouth daily. Take first 2 tablets together, then 1 every day until finished. 03/05/16   Chase Picket Mika Griffitts, PA-C  benzonatate (TESSALON) 100 MG capsule Take 2 capsules (200 mg total) by mouth 2 (two) times daily as needed for cough. 11/02/15   Barrett Henle, PA-C  betamethasone dipropionate (DIPROLENE) 0.05 % ointment Apply topically 2 (two) times daily. 11/02/15   Barrett Henle, PA-C  cephALEXin (KEFLEX) 500 MG capsule Take 1 capsule (500 mg  total) by mouth 4 (four) times daily. 04/04/15   [name], PA-C  fluticasone (FLOVENT HFA) 220 MCG/ACT inhaler Inhale 2 puffs into the lungs 2 (two) times daily. 03/03/15   Carney Living, MD  HYDROcodone-acetaminophen (NORCO/VICODIN) 5-325 MG tablet Take 2 tablets by mouth every 4 (four) hours as needed. 04/04/15   [name], PA-C  hydrOXYzine (ATARAX/VISTARIL) 10 MG tablet Take 1 tablet (10 mg total) by mouth every 6 (six) hours as needed for itching. 11/02/15   Barrett Henle, PA-C  loratadine (CLARITIN) 10 MG tablet TAKE 1 TABLET BY MOUTH EVERY DAY Patient not taking: Reported on 11/03/2014 11/20/13   Carney Living, MD  mometasone-formoterol (DULERA) 200-5 MCG/ACT AERO Inhale 2 puffs into the lungs 2 (two) times daily. 12/03/14   Nani Ravens, MD  montelukast (SINGULAIR) 10 MG tablet Take 1 tablet (10 mg total) by mouth at bedtime. Patient not taking: Reported on 11/03/2014 09/11/13   Carney Living, MD  mupirocin ointment (BACTROBAN) 2 % Place 1 application into the nose 2 (two) times daily. Place on wound 04/04/15   [name], PA-C  nicotine polacrilex (NICORETTE) 4 MG gum Take 1 each (4 mg total) by mouth as needed for smoking cessation. 12/03/14   Nani Ravens, MD  oxymetazoline [facility] NASAL SPRAY) 0.05 % nasal spray Place 1 spray into both nostrils 2 (two) times daily. Do not use for more than 3 days to prevent rebound rhinorrhea. 11/02/15   Barrett Henle, PA-C  predniSONE (DELTASONE) 20 MG tablet Take 2 tablets (40 mg total) by mouth daily. 03/05/16   Chase Picket Stephaun Million, PA-C  sertraline (ZOLOFT) 50 MG tablet Take 1 tablet (50 mg total) by mouth daily. 05/28/15   Nani Ravens, MD  triamcinolone ointment (KENALOG) 0.5 % Apply 1 application topically daily. 05/28/15   Nani Ravens, MD   Family History Family History  Problem Relation Age of Onset  . Healthy Mother   . Anxiety disorder Mother   . Healthy Father   .  Stroke Maternal Grandmother   . Stroke Paternal Grandfather   . Bipolar disorder Neg Hx   . Depression Neg Hx    Social History Social History  Substance Use Topics  . Smoking status: Current Every Day Smoker    Packs/day: 1.00    Years: 5.00    Types: Cigarettes    Last attempt to quit: 06/22/2010  . Smokeless tobacco: Never Used  . Alcohol use Yes     Comment: daily up until mid December 2015. Since then it is down to 1 beer a week   Allergies   Doxycycline  Review of Systems Review of Systems  Constitutional: Positive for fever.  HENT: Positive for congestion and sore throat.   Respiratory: Positive for cough.    Physical Exam Updated Vital Signs BP 125/55 (BP Location: Left Arm)   Pulse 81   Temp 98.3  F (36.8 C) (Oral)   Resp 16   SpO2 98%   Physical Exam  Constitutional: She is oriented to person, place, and time. She appears well-developed and well-nourished. No distress.  HENT:  Head: Normocephalic and atraumatic.  Oropharynx with erythema, but without evidence of exudates or hypertrophy.  Cardiovascular: Normal rate, regular rhythm and normal heart sounds.   No murmur heard. Pulmonary/Chest: Effort normal. No respiratory distress. She has wheezes.  Expiratory wheezing in the blateral lung fields. Speaking in full sentences without difficulty. No crackles appreciated.   Abdominal: Soft. She exhibits no distension. There is no tenderness.  Musculoskeletal: She exhibits no edema.  Neurological: She is alert and oriented to person, place, and time.  Skin: Skin is warm and dry.  Nursing note and vitals reviewed.  ED Treatments / Results  DIAGNOSTIC STUDIES: Oxygen Saturation is 100% on RA, normal by my interpretation.   COORDINATION OF CARE: 10:00 AM-Discussed next steps with pt. Pt verbalized understanding and is agreeable with the plan.   Labs (all labs ordered are listed, but only abnormal results are displayed) Labs Reviewed - No data to  display  EKG  EKG Interpretation None      Radiology Dg Chest 2 View  Result Date: 03/05/2016 CLINICAL DATA:  Flu like symptoms.  Cough and congestion. EXAM: CHEST  2 VIEW COMPARISON:  February 29, 2016 FINDINGS: The cardiomediastinal silhouette is normal. Mild bronchitic changes, slightly more prominent the interval. No focal infiltrate. No other change. IMPRESSION: Mild bronchitic changes, slightly more prominent in the interval. Recommend follow-up to resolution. Electronically Signed   By: Gerome Sam III M.D   On: 03/05/2016 11:26    Procedures Procedures   Medications Ordered in ED Medications  ipratropium-albuterol (DUONEB) 0.5-2.5 (3) MG/3ML nebulizer solution 3 mL (3 mLs Nebulization Given 03/05/16 1006)  methylPREDNISolone sodium succinate (SOLU-MEDROL) 125 mg/2 mL injection 125 mg (125 mg Intramuscular Given 03/05/16 1006)    Initial Impression / Assessment and Plan / ED Course  I have reviewed the triage vital signs and the nursing notes.  Pertinent labs & imaging results that were available during my care of the patient were reviewed by me and considered in my medical decision making (see chart for details).    [name] [name] is a 31 y.o. female daily smoker with history of asthma who presents to ED for worsening cough, congestion, wheezing over the last several days. Seen in ED on 2/27 for same and started on prednisone. Patient notes worsening symptoms since this ED visit. On exam, patient is afebrile with wheezing in bilateral lung fields. Chest x-ray was repeated which shows mild bronchitic changes, slightly more prominent since prior imaging. DuoNeb given in ED. Patient reevaluated and she feels much improved, wheezing present, but improved. Will treat with Zithromax and refill albuterol inhaler. Short burst prednisone. PCP follow-up strongly encouraged. Reasons to return to ER discussed. All questions answered.  Final Clinical Impressions(s) / ED Diagnoses    Final diagnoses:  Shortness of breath  Bronchitis   New Prescriptions Discharge Medication List as of 03/05/2016 11:38 AM    START taking these medications   Details  azithromycin (ZITHROMAX) 250 MG tablet Take 1 tablet (250 mg total) by mouth daily. Take first 2 tablets together, then 1 every day until finished., Starting Sun 03/05/2016, Print       I personally performed the services described in this documentation, which was scribed in my presence. The recorded information has been reviewed and is accurate.  [facility]leda E. Lutz Va Medical CenterJaime Pilcher Shifa Brisbon, PA-C 03/05/16 1245    Linwood DibblesJon Knapp, MD 03/05/16 [phone]

## 2016-03-05 NOTE — ED Triage Notes (Signed)
Patient here requesting recheck of flu like illness. Ongoing cough, congestion and reported fever. Has completed steroids and tamiflu. Speaking complete sentences, NAD

## 2016-03-05 NOTE — Discharge Instructions (Signed)
Please take all of your antibiotics until finished! Inhaler as needed. Prednisone daily as directed. If your symptoms do not improve following course of antibiotics, please follow-up with your primary care provider. Return to ER for new or worsening symptoms, any additional concerns.

## 2016-03-05 NOTE — ED Notes (Signed)
Declined W/C at D/C and was escorted to lobby by RN. 

## 2016-05-04 ENCOUNTER — Telehealth: Payer: Self-pay | Admitting: Family Medicine

## 2016-05-04 MED ORDER — ALBUTEROL SULFATE HFA 108 (90 BASE) MCG/ACT IN AERS: 1.0000 | INHALATION_SPRAY | Freq: Four times a day (QID) | RESPIRATORY_TRACT | 0 refills | Status: DC | PRN

## 2016-05-04 NOTE — Telephone Encounter (Signed)
Called [phone] (H)  Told her reasons why she needs an appointment  Will give one more refil  She will make an appointment with me and financial counselor for perhaps orange card

## 2016-05-04 NOTE — Telephone Encounter (Signed)
Pt is calling to see why the doctor will not fill her pro air. The patient hasn't been seen in almost a year and all the prescription for this medication has been by PA's . I tried to get her to schedule an appointment, but she said we just always fill her medication and doesn't see an reason to come and pay a doctor since she already has to pay for the medication and she doesn't have insurance. She also said that the PA's work in our office and she has never went anywhere else. Please call patient so she can go pick up her medication. jw

## 2016-05-04 NOTE — Telephone Encounter (Signed)
I sent in an Erx for proventil.  Please check with pharmacy to see if we need to fill out a PA  Thanks  LC

## 2016-06-05 ENCOUNTER — Encounter: Payer: Self-pay | Admitting: Family Medicine

## 2016-06-05 ENCOUNTER — Ambulatory Visit (INDEPENDENT_AMBULATORY_CARE_PROVIDER_SITE_OTHER): Payer: Self-pay | Admitting: Family Medicine

## 2016-06-05 VITALS — BP 120/70 | HR 88 | Temp 99.2°F | Ht 60.0 in | Wt 151.2 lb

## 2016-06-05 DIAGNOSIS — J4540 Moderate persistent asthma, uncomplicated: Secondary | ICD-10-CM

## 2016-06-05 DIAGNOSIS — L309 Dermatitis, unspecified: Secondary | ICD-10-CM

## 2016-06-05 MED ORDER — ALBUTEROL SULFATE HFA 108 (90 BASE) MCG/ACT IN AERS: 1.0000 | INHALATION_SPRAY | Freq: Four times a day (QID) | RESPIRATORY_TRACT | 1 refills | Status: DC | PRN

## 2016-06-05 MED ORDER — FLUTICASONE FUROATE-VILANTEROL 100-25 MCG/INH IN AEPB: 1.0000 | INHALATION_SPRAY | Freq: Every day | RESPIRATORY_TRACT | 0 refills | Status: DC

## 2016-06-05 MED ORDER — TRIAMCINOLONE ACETONIDE 0.5 % EX OINT: 1.0000 "application " | TOPICAL_OINTMENT | Freq: Two times a day (BID) | CUTANEOUS | 1 refills | Status: DC

## 2016-06-05 NOTE — Assessment & Plan Note (Signed)
Poorly controlled Her symptoms Place her in the moderate persistent to severe persistent severity classification Albuterol refilled Start breo daily - samples given Advised patient to attempt to get financial assistance set up Follow-up in one month

## 2016-06-05 NOTE — Progress Notes (Signed)
   Subjective:   [name] [name] is a 31 y.o. female with a history of GAD, depression, eczema, tobacco use disorder, asthma here for  Chief Complaint  Patient presents with  . Follow-up    asthma, eczema     Asthma - moved out of mold filled house 3 weeks ago - feels like this was exacerbating her asthma - feels like "lungs are coming back to normal" - used to take dulera and singulair but unable to afford them for the last year - thinks they likely helped with her symptoms - Asthma control Test shows that in the past 4 weeks she is her shortness of breath more than once per day, her asthma symptoms have been waking her once or twice nightly, she is using her rescue inhaler 1-2 times per day and she feels that her asthma is poorly controlled   Eczema - on neck, elbow flexural creases, around nose - tried switching soaps and lotions, using fragrance free - cut all nails short because she was scratching so much - Was using triamcinolone ointment but has run out  Review of Systems:  Per HPI.   Social History: current smoker  Objective:  BP 120/70   Pulse 88   Temp 99.2 F (37.3 C) (Oral)   Ht 5' (1.524 m)   Wt 151 lb 3.2 oz (68.6 kg)   SpO2 97%   BMI 29.53 kg/m   Gen:  31 y.o. female in NAD HEENT: NCAT, MMM, EOMI, PERRL, anicteric sclerae, OP clear, TMs clear CV: RRR, no MRG Resp: Non-labored, diffuse expiratory wheezes, good air movement Ext: WWP, no edema MSK: No obvious deformities, gait intact Skin: Erythematous maculopapular rash with lichenification of bilateral elbow flexure old surfaces, back of neck and small area of maculopapular rash around left nostril Neuro: Alert and oriented, speech normal      Assessment & Plan:     [name] [name] is a 31 y.o. female here for   Asthma Poorly controlled Her symptoms Place her in the moderate persistent to severe persistent severity classification Albuterol refilled Start breo daily - samples given Advised  patient to attempt to get financial assistance set up Follow-up in one month  Eczema Poorly controlled  resume triamcinolone Discussed Zyrtec use Discussed lifestyle interventions to help including shorter, cooler showers, frequent lotion application, sensitive skin unscented detergents and soaps, allergen removal from the home, etc. Return precautions discussed including signs of secondary bacterial infection   Beryle FlockBacigalupo, Marzella SchleinAngela M, MD MPH PGY-3,  [facility] 06/05/2016  5:33 PM

## 2016-06-05 NOTE — Patient Instructions (Addendum)
Make an appt with Annice PihJackie to discuss financial assistance.  For now, use the samples provided.  Asthma, Adult Asthma is a recurring condition in which the airways tighten and narrow. Asthma can make it difficult to breathe. It can cause coughing, wheezing, and shortness of breath. Asthma episodes, also called asthma attacks, range from minor to life-threatening. Asthma cannot be cured, but medicines and lifestyle changes can help control it. What are the causes? Asthma is believed to be caused by inherited (genetic) and environmental factors, but its exact cause is unknown. Asthma may be triggered by allergens, lung infections, or irritants in the air. Asthma triggers are different for each person. Common triggers include:  Animal dander.  Dust mites.  Cockroaches.  Pollen from trees or grass.  Mold.  Smoke.  Air pollutants such as dust, household cleaners, hair sprays, aerosol sprays, paint fumes, strong chemicals, or strong odors.  Cold air, weather changes, and winds (which increase molds and pollens in the air).  Strong emotional expressions such as crying or laughing hard.  Stress.  Certain medicines (such as aspirin) or types of drugs (such as beta-blockers).  Sulfites in foods and drinks. Foods and drinks that may contain sulfites include dried fruit, potato chips, and sparkling grape juice.  Infections or inflammatory conditions such as the flu, a cold, or an inflammation of the nasal membranes (rhinitis).  Gastroesophageal reflux disease (GERD).  Exercise or strenuous activity.  What are the signs or symptoms? Symptoms may occur immediately after asthma is triggered or many hours later. Symptoms include:  Wheezing.  Excessive nighttime or early morning coughing.  Frequent or severe coughing with a common cold.  Chest tightness.  Shortness of breath.  How is this diagnosed? The diagnosis of asthma is made by a review of your medical history and a physical  exam. Tests may also be performed. These may include:  Lung function studies. These tests show how much air you breathe in and out.  Allergy tests.  Imaging tests such as X-rays.  How is this treated? Asthma cannot be cured, but it can usually be controlled. Treatment involves identifying and avoiding your asthma triggers. It also involves medicines. There are 2 classes of medicine used for asthma treatment:  Controller medicines. These prevent asthma symptoms from occurring. They are usually taken every day.  Reliever or rescue medicines. These quickly relieve asthma symptoms. They are used as needed and provide short-term relief.  Your health care provider will help you create an asthma action plan. An asthma action plan is a written plan for managing and treating your asthma attacks. It includes a list of your asthma triggers and how they may be avoided. It also includes information on when medicines should be taken and when their dosage should be changed. An action plan may also involve the use of a device called a peak flow meter. A peak flow meter measures how well the lungs are working. It helps you monitor your condition. Follow these instructions at home:  Take medicines only as directed by your health care provider. Speak with your health care provider if you have questions about how or when to take the medicines.  Use a peak flow meter as directed by your health care provider. Record and keep track of readings.  Understand and use the action plan to help minimize or stop an asthma attack without needing to seek medical care.  Control your home environment in the following ways to help prevent asthma attacks: ? Do not smoke.  Avoid being exposed to secondhand smoke. ? Change your heating and air conditioning filter regularly. ? Limit your use of fireplaces and wood stoves. ? Get rid of pests (such as roaches and mice) and their droppings. ? Throw away plants if you see mold on  them. ? Clean your floors and dust regularly. Use unscented cleaning products. ? Try to have someone else vacuum for you regularly. Stay out of rooms while they are being vacuumed and for a short while afterward. If you vacuum, use a dust mask from a hardware store, a double-layered or microfilter vacuum cleaner bag, or a vacuum cleaner with a HEPA filter. ? Replace carpet with wood, tile, or vinyl flooring. Carpet can trap dander and dust. ? Use allergy-proof pillows, mattress covers, and box spring covers. ? Wash bed sheets and blankets every week in hot water and dry them in a dryer. ? Use blankets that are made of polyester or cotton. ? Clean bathrooms and kitchens with bleach. If possible, have someone repaint the walls in these rooms with mold-resistant paint. Keep out of the rooms that are being cleaned and painted. ? Wash hands frequently. Contact a health care provider if:  You have wheezing, shortness of breath, or a cough even if taking medicine to prevent attacks.  The colored mucus you cough up (sputum) is thicker than usual.  Your sputum changes from clear or white to yellow, green, gray, or bloody.  You have any problems that may be related to the medicines you are taking (such as a rash, itching, swelling, or trouble breathing).  You are using a reliever medicine more than 2-3 times per week.  Your peak flow is still at 50-79% of your personal best after following your action plan for 1 hour.  You have a fever. Get help right away if:  You seem to be getting worse and are unresponsive to treatment during an asthma attack.  You are short of breath even at rest.  You get short of breath when doing very little physical activity.  You have difficulty eating, drinking, or talking due to asthma symptoms.  You develop chest pain.  You develop a fast heartbeat.  You have a bluish color to your lips or fingernails.  You are light-headed, dizzy, or faint.  Your peak  flow is less than 50% of your personal best. This information is not intended to replace advice given to you by your health care provider. Make sure you discuss any questions you have with your health care provider. Document Released: 12/19/2004 Document Revised: 06/02/2015 Document Reviewed: 07/18/2012 Elsevier Interactive Patient Education  2017 [employer].

## 2016-06-05 NOTE — Assessment & Plan Note (Signed)
Poorly controlled  resume triamcinolone Discussed Zyrtec use Discussed lifestyle interventions to help including shorter, cooler showers, frequent lotion application, sensitive skin unscented detergents and soaps, allergen removal from the home, etc. Return precautions discussed including signs of secondary bacterial infection

## 2017-01-19 ENCOUNTER — Encounter (HOSPITAL_COMMUNITY): Payer: Self-pay | Admitting: Emergency Medicine

## 2017-01-19 ENCOUNTER — Other Ambulatory Visit: Payer: Self-pay

## 2017-01-19 ENCOUNTER — Emergency Department (HOSPITAL_COMMUNITY): Payer: Self-pay

## 2017-01-19 DIAGNOSIS — J45909 Unspecified asthma, uncomplicated: Secondary | ICD-10-CM | POA: Insufficient documentation

## 2017-01-19 DIAGNOSIS — Z5321 Procedure and treatment not carried out due to patient leaving prior to being seen by health care provider: Secondary | ICD-10-CM | POA: Insufficient documentation

## 2017-01-19 MED ORDER — ALBUTEROL SULFATE (2.5 MG/3ML) 0.083% IN NEBU
5.0000 mg | INHALATION_SOLUTION | Freq: Once | RESPIRATORY_TRACT | Status: AC
  Administered 2017-01-19: 5 mg via RESPIRATORY_TRACT
  Filled 2017-01-19: qty 6

## 2017-01-19 NOTE — ED Triage Notes (Signed)
Pt present with worsening shob, ran out of home neb solution. Pt states she feels "tight"

## 2017-01-20 ENCOUNTER — Emergency Department (HOSPITAL_COMMUNITY)
Admission: EM | Admit: 2017-01-20 | Discharge: 2017-01-20 | Discharge status: Against Medical Advice | Payer: Self-pay | Attending: Emergency Medicine | Admitting: Emergency Medicine

## 2017-01-20 NOTE — ED Notes (Signed)
Unable to locate patient in lobby, presumed to have left without being seen.  

## 2017-01-20 NOTE — ED Notes (Signed)
Attempted to bring to peds, no answer in waiting room.

## 2017-02-26 ENCOUNTER — Other Ambulatory Visit: Payer: Self-pay | Admitting: Family Medicine

## 2017-03-21 ENCOUNTER — Telehealth: Payer: Self-pay | Admitting: Family Medicine

## 2017-03-21 NOTE — Telephone Encounter (Signed)
Called pt, no answer left voicemail.

## 2017-04-17 ENCOUNTER — Other Ambulatory Visit: Payer: Self-pay | Admitting: Family Medicine

## 2017-04-30 ENCOUNTER — Other Ambulatory Visit: Payer: Self-pay | Admitting: Family Medicine

## 2017-05-23 ENCOUNTER — Emergency Department (HOSPITAL_COMMUNITY)
Admission: EM | Admit: 2017-05-23 | Discharge: 2017-05-24 | Disposition: A | Discharge status: Home / Self Care | Payer: Self-pay | Attending: Emergency Medicine | Admitting: Emergency Medicine

## 2017-05-23 ENCOUNTER — Encounter (HOSPITAL_COMMUNITY): Payer: Self-pay | Admitting: *Deleted

## 2017-05-23 ENCOUNTER — Emergency Department (HOSPITAL_COMMUNITY): Payer: Self-pay

## 2017-05-23 ENCOUNTER — Other Ambulatory Visit: Payer: Self-pay

## 2017-05-23 DIAGNOSIS — R42 Dizziness and giddiness: Secondary | ICD-10-CM

## 2017-05-23 DIAGNOSIS — Z87891 Personal history of nicotine dependence: Secondary | ICD-10-CM | POA: Insufficient documentation

## 2017-05-23 DIAGNOSIS — G44309 Post-traumatic headache, unspecified, not intractable: Secondary | ICD-10-CM

## 2017-05-23 DIAGNOSIS — J45909 Unspecified asthma, uncomplicated: Secondary | ICD-10-CM | POA: Insufficient documentation

## 2017-05-23 DIAGNOSIS — Z79899 Other long term (current) drug therapy: Secondary | ICD-10-CM | POA: Insufficient documentation

## 2017-05-23 DIAGNOSIS — F0781 Postconcussional syndrome: Secondary | ICD-10-CM | POA: Insufficient documentation

## 2017-05-23 DIAGNOSIS — R51 Headache: Secondary | ICD-10-CM | POA: Insufficient documentation
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
## 2017-05-24 MED ORDER — DIPHENHYDRAMINE HCL 50 MG/ML IJ SOLN
12.5000 mg | Freq: Once | INTRAMUSCULAR | Status: DC
  Filled 2017-05-24: qty 1

## 2017-05-24 MED ORDER — DIPHENHYDRAMINE HCL 25 MG PO CAPS
50.0000 mg | ORAL_CAPSULE | Freq: Once | ORAL | Status: AC
  Administered 2017-05-24: 50 mg via ORAL
  Filled 2017-05-24: qty 2

## 2017-05-24 MED ORDER — SODIUM CHLORIDE 0.9 % IV BOLUS: 1000.0000 mL | Freq: Once | INTRAVENOUS | Status: DC

## 2017-05-24 MED ORDER — IBUPROFEN 800 MG PO TABS: 800.0000 mg | ORAL_TABLET | Freq: Three times a day (TID) | ORAL | 0 refills | Status: DC

## 2017-05-24 MED ORDER — KETOROLAC TROMETHAMINE 30 MG/ML IJ SOLN
30.0000 mg | Freq: Once | INTRAMUSCULAR | Status: DC
  Filled 2017-05-24: qty 1

## 2017-05-24 MED ORDER — MECLIZINE HCL 25 MG PO TABS: 25.0000 mg | ORAL_TABLET | Freq: Three times a day (TID) | ORAL | 0 refills | Status: DC | PRN

## 2017-05-24 MED ORDER — METOCLOPRAMIDE HCL 5 MG/ML IJ SOLN
10.0000 mg | Freq: Once | INTRAMUSCULAR | Status: AC
  Administered 2017-05-24: 10 mg via INTRAMUSCULAR
  Filled 2017-05-24: qty 2

## 2017-05-24 MED ORDER — METOCLOPRAMIDE HCL 5 MG/ML IJ SOLN
10.0000 mg | Freq: Once | INTRAMUSCULAR | Status: DC
  Filled 2017-05-24: qty 2

## 2017-05-24 MED ORDER — KETOROLAC TROMETHAMINE 60 MG/2ML IM SOLN
60.0000 mg | Freq: Once | INTRAMUSCULAR | Status: AC
  Administered 2017-05-24: 60 mg via INTRAMUSCULAR
  Filled 2017-05-24: qty 2
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
## 2017-05-24 NOTE — ED Notes (Signed)
RN attempted for IV x2 without success 

## 2017-05-24 NOTE — Discharge Instructions (Addendum)
1. Medications: Ibuprofen and Tylenol for pain; Meclizine for dizziness 2. Treatment: Rest, ice on head.  Concussion precautions: keep patient in a quiet, not simulating, dark environment. No TV, computer use, video games until headache is resolved completely. No contact sports until cleared by the pediatrician. 3. Follow Up: With primary care physician on Monday if headache persists and ENT next week if dizziness persists.  Return to the emergency department if patient becomes lethargic, begins vomiting, develops double vision, speech difficulty, problems walking or other change in mental status.

## 2017-05-25 ENCOUNTER — Other Ambulatory Visit: Payer: Self-pay

## 2017-05-25 ENCOUNTER — Encounter (HOSPITAL_COMMUNITY): Payer: Self-pay

## 2017-05-25 ENCOUNTER — Emergency Department (HOSPITAL_COMMUNITY)
Admission: EM | Admit: 2017-05-25 | Discharge: 2017-05-25 | Disposition: A | Discharge status: Home / Self Care | Payer: Self-pay | Attending: Emergency Medicine | Admitting: Emergency Medicine

## 2017-05-25 DIAGNOSIS — F0781 Postconcussional syndrome: Secondary | ICD-10-CM | POA: Insufficient documentation

## 2017-05-25 DIAGNOSIS — R55 Syncope and collapse: Secondary | ICD-10-CM | POA: Insufficient documentation

## 2017-05-25 DIAGNOSIS — R4020 Unspecified coma: Secondary | ICD-10-CM

## 2017-05-25 DIAGNOSIS — F17210 Nicotine dependence, cigarettes, uncomplicated: Secondary | ICD-10-CM | POA: Insufficient documentation

## 2017-05-25 DIAGNOSIS — Z79899 Other long term (current) drug therapy: Secondary | ICD-10-CM | POA: Insufficient documentation

## 2017-05-25 LAB — BASIC METABOLIC PANEL
Anion gap: 10 (ref 5–15)
BUN: 10 mg/dL (ref 6–20)
CO2: 24 mmol/L (ref 22–32)
Calcium: 9 mg/dL (ref 8.9–10.3)
Chloride: 106 mmol/L (ref 101–111)
Creatinine, Ser: 0.73 mg/dL (ref 0.44–1.00)
GLUCOSE: 93 mg/dL (ref 65–99)
POTASSIUM: 4.1 mmol/L (ref 3.5–5.1)
Sodium: 140 mmol/L (ref 135–145)

## 2017-05-25 LAB — CBC WITH DIFFERENTIAL/PLATELET
Abs Immature Granulocytes: 0 10*3/uL (ref 0.0–0.1)
BASOS ABS: 0.1 10*3/uL (ref 0.0–0.1)
BASOS PCT: 1 %
EOS ABS: 0.4 10*3/uL (ref 0.0–0.7)
Eosinophils Relative: 4 %
HCT: 44.4 % (ref 36.0–46.0)
Hemoglobin: 14.8 g/dL (ref 12.0–15.0)
Immature Granulocytes: 0 %
LYMPHS PCT: 24 %
Lymphs Abs: 2.5 10*3/uL (ref 0.7–4.0)
MCH: 33.3 pg (ref 26.0–34.0)
MCHC: 33.3 g/dL (ref 30.0–36.0)
MCV: 100 fL (ref 78.0–100.0)
MONO ABS: 0.7 10*3/uL (ref 0.1–1.0)
MONOS PCT: 7 %
NEUTROS ABS: 6.5 10*3/uL (ref 1.7–7.7)
NEUTROS PCT: 64 %
PLATELETS: 383 10*3/uL (ref 150–400)
RBC: 4.44 MIL/uL (ref 3.87–5.11)
RDW: 13 % (ref 11.5–15.5)
WBC: 10.2 10*3/uL (ref 4.0–10.5)

## 2017-05-25 LAB — I-STAT BETA HCG BLOOD, ED (MC, WL, AP ONLY): HCG, QUANTITATIVE: 5.1 m[IU]/mL — AB (ref <5)

## 2017-05-25 NOTE — ED Notes (Signed)
Pt ambulated to the restroom without difficulty. Gait steady/even. 

## 2017-05-25 NOTE — ED Triage Notes (Signed)
Pt states that she was recently seen for a concussion and last night while sitting in bed she had an episode of LOC. Pt reports that her boyfriend told her she was was "out for about 2 minutes and not breathing for a couple of seconds" also told her she was "stumbling and slurring her words".

## 2017-05-25 NOTE — ED Notes (Signed)
Patient left /did not wait for EDP to explain plan of care per her request .

## 2017-05-25 NOTE — ED Provider Notes (Signed)
[facility] EMERGENCY DEPARTMENT Provider Note   CSN: [id number] Arrival date & time: 05/25/17  1505     History   Chief Complaint Chief Complaint  Patient presents with  . Loss of Consciousness    HPI [name] [name] is a 32 y.o. female.  HPI  32 year old female comes in with chief complaint of loss of consciousness. Patient was seen in the ER on 522 with headaches after she had a fall.  Patient had a CT scan of her head which was negative at that time.  She states that after being discharged she continues to have dizziness and difficulty in concentrating.    Last night she had an episode of syncope that was witnessed by her friend.  Patient states that she had no prodrome prior to the syncopal episode, and her friend states that patient lost consciousness for about 3 to 5 minutes and had shallow respirations.  He also felt that the patient was confused and slurred when she came off of her loss of consciousness episode.  There was no seizure-like activity noted by him.    Patient denies any substance abuse or heavy alcohol use.  She also denies any premature CAD in the family, Pt has no hx of PE, DVT and denies any exogenous hormone (testosterone / estrogen) use, long distance travels or surgery in the past 6 weeks, active cancer, recent immobilization.   Past Medical History:  Diagnosis Date  . Admission for sterilization 06/12/2013  . Asthma   . Breast abscess   . Depression    HX of - not on med n, doing well.  . Eczema   . Family history of anesthesia complication    Grandmother has difficulty waskin up.  . Pneumonia    HX only -years ago has had a few times.  . Pregnant state, incidental   . S/P tubal ligation 06/13/2013  . SVD (spontaneous vaginal delivery)    x 2    Patient Active Problem List   Diagnosis Date Noted  . Tobacco use disorder 12/07/2014  . Major depressive disorder, recurrent episode, moderate (HCC) 01/13/2014  . GAD  (generalized anxiety disorder) 01/13/2014  . Tinea pedis of both feet 09/10/2013  . S/P tubal ligation 06/13/2013  . Admission for sterilization 06/12/2013  . Rash and nonspecific skin eruption 06/09/2013  . Neck discomfort 02/26/2013  . Tachycardia 10/23/2012  . Allergic conjunctivitis of both eyes and rhinitis 10/14/2012  . Mastitis, Left. 11/27/2011  . Depression 03/24/2011  . Asthma 03/01/2006  . Eczema 03/01/2006    Past Surgical History:  Procedure Laterality Date  . INCISE AND DRAIN ABCESS     left breast x 2  . INCISION AND DRAINAGE ABSCESS Left 02/17/2012   Procedure:  DRAINAGEof recurrent breast abscess;  Surgeon: Shelly Rubenstein, MD;  Location: [facility] OR;  Service: General;  Laterality: Left;  . LAPAROSCOPIC TUBAL LIGATION Bilateral 06/13/2013   Procedure: LAPAROSCOPIC TUBAL LIGATION;  Surgeon: Sherian Rein, MD;  Location: [facility] ORS;  Service: Gynecology;  Laterality: Bilateral;     OB History    Gravida  2   Para  2   Term  1   Preterm  1   AB      Living  2     SAB      TAB      Ectopic      Multiple      Live Births  2  Home Medications    Prior to Admission medications   Medication Sig Start Date End Date Taking? Authorizing Provider  albuterol (PROVENTIL HFA;VENTOLIN HFA) 108 (90 Base) MCG/ACT inhaler INHALE 1 TO 2 PUFFS BY MOUTH EVERY 6 HOURS AS NEEDED FOR WHEEZING OR SHORTNESS OF BREATH 05/01/17  Yes Chambliss, Estill Batten, MD  ibuprofen (ADVIL,MOTRIN) 800 MG tablet Take 1 tablet (800 mg total) by mouth 3 (three) times daily. 05/24/17  Yes Muthersbaugh, Dahlia Client, PA-C  meclizine (ANTIVERT) 25 MG tablet Take 1 tablet (25 mg total) by mouth 3 (three) times daily as needed for dizziness. 05/24/17  Yes Muthersbaugh, Dahlia Client, PA-C    Family History Family History  Problem Relation Age of Onset  . Healthy Mother   . Anxiety disorder Mother   . Healthy Father   . Stroke Maternal Grandmother   . Stroke Paternal Grandfather   .  Bipolar disorder Neg Hx   . Depression Neg Hx     Social History Social History   Tobacco Use  . Smoking status: Current Every Day Smoker    Packs/day: 1.00    Years: 5.00    Pack years: 5.00    Types: Cigarettes  . Smokeless tobacco: Never Used  Substance Use Topics  . Alcohol use: Yes    Comment: occasional  . Drug use: No     Allergies   Doxycycline   Review of Systems Review of Systems  Constitutional: Positive for activity change.  Respiratory: Negative for shortness of breath.   Cardiovascular: Negative for chest pain.  Neurological: Positive for syncope and light-headedness. Negative for headaches.  Hematological: Does not bruise/bleed easily.     Physical Exam Updated Vital Signs BP 105/61   Pulse 74   Temp 98.9 F (37.2 C) (Oral)   Resp 17   Ht 5' (1.524 m)   Wt 68 kg (150 lb)   SpO2 94%   BMI 29.29 kg/m   Physical Exam  Constitutional: She is oriented to person, place, and time. She appears well-developed.  HENT:  Head: Normocephalic and atraumatic.  Eyes: EOM are normal.  Neck: Neck supple.  Cardiovascular: Normal rate.  Pulmonary/Chest: Effort normal.  Abdominal: Bowel sounds are normal.  Musculoskeletal: She exhibits no edema, tenderness or deformity.  Cerebellar exam is normal (finger to nose) Sensory exam normal for bilateral upper and lower extremities - and patient is able to discriminate between sharp and dull. Motor exam is 4+/5   Neurological: She is alert and oriented to person, place, and time.  Skin: Skin is warm and dry.  Nursing note and vitals reviewed.    ED Treatments / Results  Labs (all labs ordered are listed, but only abnormal results are displayed) Labs Reviewed  I-STAT BETA HCG BLOOD, ED ([facility], [facility], [facility] ONLY) - Abnormal; Notable for the following components:      Result Value   I-stat hCG, quantitative 5.1 (*)    All other components within normal limits  BASIC METABOLIC PANEL  CBC WITH DIFFERENTIAL/PLATELET     EKG EKG Interpretation  Date/Time:  Friday May 25 2017 15:12:30 EDT Ventricular Rate:  98 PR Interval:  176 QRS Duration: 76 QT Interval:  336 QTC Calculation: 428 R Axis:   83 Text Interpretation:  Normal sinus rhythm Normal ECG No acute changes No significant change since last tracing Confirmed by Derwood Kaplan [phone]) on 05/25/2017 7:17:26 PM   Radiology No results found.  Procedures Procedures (including critical care time)  Medications Ordered in ED Medications - No data to  display   Initial Impression / Assessment and Plan / ED Course  I have reviewed the triage vital signs and the nursing notes.  Pertinent labs & imaging results that were available during my care of the patient were reviewed by me and considered in my medical decision making (see chart for details).  Clinical Course as of May 25 2353  Fri May 25, 2017  2344 Amended to talk to the patient after discharging her.  The nurse had informed the patient that I will come and see her and discuss the results, however when I went into the room patient was already gone.   [AN]    Clinical Course User Index [AN] Derwood Kaplan, MD    32 year old female comes in with chief complaint of LOC. She has no medical problems and does not take any medications besides PRN inhalers. Patient's syncopal episode occurred yesterday.  She had a recent episode of TBI and postconcussion syndrome.  It seems like patient's difficulty in concentration, headaches, dizziness could be related to her postconcussion syndrome.  Her syncope likely is not because of postconcussion syndrome.  Patient does not have any cardiac or neurologic risk factors.  She does not use any drugs and there is no PE concerns based on history and exam.  Patient is PERC negative as well.  EKG is reassuring.  Will put patient on telemetry monitoring for the next 2 hours.  She will be advised to follow-up with concussion specialist for her postconcussion  syndrome and also with PCP to see if they want further syncope work-up.  Final Clinical Impressions(s) / ED Diagnoses   Final diagnoses:  LOC (loss of consciousness) [facility])  Post concussion syndrome    ED Discharge Orders    None       Derwood Kaplan, MD 05/25/17 2358

## 2017-05-25 NOTE — Discharge Instructions (Signed)
We saw you in the ER after he had a fainting spell yesterday.  It also appears that you were having dizziness, difficulty concentrating, headaches and other symptoms after your traumatic event last week. We suspect that a lot of your symptoms are due to postconcussion syndrome. We would like you to see her primary care doctor to see if you need to use a Holter monitor for your fainting.  ER work-up does not show any concerning cardiac arrhythmia and all your labs are normal.  Return to the ER again if you faint.

## 2017-05-25 NOTE — ED Provider Notes (Signed)
Patient placed in Quick Look pathway, seen and evaluated   Chief Complaint: LOC  HPI: Patient is a 32 year old female who presents the emergency department for possible syncope last night.  She was seen in the emergency department 2 days prior due to complaints of a headache status post head injury 1 week ago.  She states that since the head injury she has had headaches, difficulty concentrating, and "fogginess."  At times over the past week she has felt lightheaded but has not had any syncope episodes.  She states that last night she was sitting in her bed and her boyfriend informed her that she passed out for 1 to 2 minutes.  She does not recall having any symptoms prior to this or actually passing out.  She remembers him trying to wake her up.  She says she felt fine afterwards, her boyfriend felt that she was having trouble walking and trouble with her speech.  These have been at baseline today.  Today she still having headache and trouble concentrating. No change in vision, numbness or weakness.   ROS: Positive for headache, trouble concentrating, and syncope  Negative for chest pain, dyspnea, numbness, or weakness.   Physical Exam:   Gen: No distress  Neuro: Awake and Alert  Skin: Warm    Focused Exam: Clear speech.  CN III through XII grossly intact.  Negative pronator drift.  5 out of 5 symmetric grip strength.  Sensation grossly intact bilateral upper extremities.  Patient with negative head CT at previous ER visit, subsequent head injuries.  Will initiate evaluation with basic labs and EKG.  Initiation of care has begun. The patient has been counseled on the process, plan, and necessity for staying for the completion/evaluation, and the remainder of the medical screening examination.  Advised patient alert staff should her symptoms change/worsen or should she have any other concerns.   Cherly Anderson, PA-C 05/25/17 1646    Derwood Kaplan, MD 05/26/17 [phone]

## 2017-06-15 ENCOUNTER — Other Ambulatory Visit: Payer: Self-pay | Admitting: Family Medicine

## 2017-07-13 ENCOUNTER — Other Ambulatory Visit: Payer: Self-pay | Admitting: Family Medicine

## 2017-07-18 NOTE — Telephone Encounter (Signed)
Pt informed that she will have to make an appt before getting a refill. She declined. Says she doesn't have insurance and she pays out of pocket for the inhalers. Please advise. Deseree Bruna PotterBlount, CMA

## 2017-07-20 MED ORDER — ALBUTEROL SULFATE HFA 108 (90 BASE) MCG/ACT IN AERS: 2.0000 | INHALATION_SPRAY | RESPIRATORY_TRACT | 0 refills | Status: DC | PRN

## 2017-07-20 NOTE — Addendum Note (Signed)
Addended by: Pearlean BrownieHAMBLISS, MARSHALL L on: 07/20/2017 05:01 PM   Modules accepted: Orders

## 2017-10-01 ENCOUNTER — Encounter (HOSPITAL_COMMUNITY): Payer: Self-pay | Admitting: *Deleted

## 2017-10-01 ENCOUNTER — Emergency Department (HOSPITAL_COMMUNITY)
Admission: EM | Admit: 2017-10-01 | Discharge: 2017-10-01 | Disposition: A | Discharge status: Home / Self Care | Payer: Self-pay | Attending: Emergency Medicine | Admitting: Emergency Medicine

## 2017-10-01 ENCOUNTER — Emergency Department (HOSPITAL_COMMUNITY): Payer: Self-pay

## 2017-10-01 ENCOUNTER — Other Ambulatory Visit: Payer: Self-pay

## 2017-10-01 DIAGNOSIS — R079 Chest pain, unspecified: Secondary | ICD-10-CM | POA: Insufficient documentation

## 2017-10-01 DIAGNOSIS — F17210 Nicotine dependence, cigarettes, uncomplicated: Secondary | ICD-10-CM | POA: Insufficient documentation

## 2017-10-01 DIAGNOSIS — J45909 Unspecified asthma, uncomplicated: Secondary | ICD-10-CM | POA: Insufficient documentation

## 2017-10-01 DIAGNOSIS — J4521 Mild intermittent asthma with (acute) exacerbation: Secondary | ICD-10-CM | POA: Insufficient documentation

## 2017-10-01 MED ORDER — IPRATROPIUM-ALBUTEROL 0.5-2.5 (3) MG/3ML IN SOLN
3.0000 mL | Freq: Once | RESPIRATORY_TRACT | Status: AC
  Administered 2017-10-01: 3 mL via RESPIRATORY_TRACT
  Filled 2017-10-01: qty 3

## 2017-10-01 MED ORDER — BENZONATATE 100 MG PO CAPS
100.0000 mg | ORAL_CAPSULE | Freq: Three times a day (TID) | ORAL | 0 refills | Status: DC
Start: 2017-10-01 — End: 2018-09-09

## 2017-10-01 MED ORDER — PREDNISONE 20 MG PO TABS: ORAL_TABLET | ORAL | 0 refills | Status: DC

## 2017-10-01 MED ORDER — PREDNISONE 20 MG PO TABS
60.0000 mg | ORAL_TABLET | Freq: Once | ORAL | Status: AC
Start: 2017-10-01 — End: 2017-10-01
  Administered 2017-10-01: 60 mg via ORAL
  Filled 2017-10-01: qty 3

## 2017-10-01 MED ORDER — ALBUTEROL SULFATE HFA 108 (90 BASE) MCG/ACT IN AERS
2.0000 | INHALATION_SPRAY | RESPIRATORY_TRACT | Status: DC | PRN
  Filled 2017-10-01: qty 6.7

## 2017-10-01 MED ORDER — ALBUTEROL SULFATE (2.5 MG/3ML) 0.083% IN NEBU: 5.0000 mg | INHALATION_SOLUTION | Freq: Once | RESPIRATORY_TRACT | Status: DC

## 2017-10-01 NOTE — ED Provider Notes (Signed)
Patient placed in Quick Look pathway, seen and evaluated   Chief Complaint: Cough, asthma exacerbation   HPI:   [name] [name] is a 32 y.o. female  with a PMH of asthma who presents to the Emergency Department complaining of cough x 3 days. Does not believe she had a fever. Tried her inhaler and nebulizer at home with no improvement.     ROS: + cough, shortness of breath  - chest pain, abdominal pain  Physical Exam:   Gen: No distress  Neuro: Awake and Alert  Skin: Warm    Focused Exam: Inspiratory wheezing in all lung fields.    Initiation of care has begun. The patient has been counseled on the process, plan, and necessity for staying for the completion/evaluation, and the remainder of the medical screening examination    Kimsey Demaree, Chase Picket, PA-C 10/01/17 1308    Cathren Laine, MD 10/01/17 [phone]

## 2017-10-01 NOTE — ED Triage Notes (Signed)
Pt in c/o cough and asthma exacerbation for the last few days, has been using inhalers and nebulizer tx at home without relief, unsure of fever at home

## 2017-10-01 NOTE — ED Provider Notes (Signed)
[facility] EMERGENCY DEPARTMENT Provider Note   CSN: [id number] Arrival date & time: 10/01/17  1257     History   Chief Complaint Chief Complaint  Patient presents with  . Asthma    HPI [name] [name] is a 32 y.o. female.  The history is provided by the patient. No language interpreter was used.  Asthma      32 year old female with history of asthma presenting complaint of shortness of breath.  Patient report for the past 3 days she has had worsening shortness of breath and wheezing similar to her prior asthma.  Symptom has been persistent, not adequately improved despite using her rescue inhaler every couple hours.  She also complaining of some pleuritic pain in her chest along with coughing.  She denies any fever chills, hemoptysis, abdominal pain, lightheadedness or dizziness.  No prior history of PE or DVT, no recent surgery, prolonged bedrest, active cancer or hemoptysis.  She attributed her symptoms to weather changes.  Past Medical History:  Diagnosis Date  . Admission for sterilization 06/12/2013  . Asthma   . Breast abscess   . Depression    HX of - not on med n, doing well.  . Eczema   . Family history of anesthesia complication    Grandmother has difficulty waskin up.  . Pneumonia    HX only -years ago has had a few times.  . Pregnant state, incidental   . S/P tubal ligation 06/13/2013  . SVD (spontaneous vaginal delivery)    x 2    Patient Active Problem List   Diagnosis Date Noted  . Tobacco use disorder 12/07/2014  . Major depressive disorder, recurrent episode, moderate (HCC) 01/13/2014  . GAD (generalized anxiety disorder) 01/13/2014  . Tinea pedis of both feet 09/10/2013  . S/P tubal ligation 06/13/2013  . Admission for sterilization 06/12/2013  . Rash and nonspecific skin eruption 06/09/2013  . Neck discomfort 02/26/2013  . Tachycardia 10/23/2012  . Allergic conjunctivitis of both eyes and rhinitis 10/14/2012  . Mastitis,  Left. 11/27/2011  . Depression 03/24/2011  . Asthma 03/01/2006  . Eczema 03/01/2006    Past Surgical History:  Procedure Laterality Date  . INCISE AND DRAIN ABCESS     left breast x 2  . INCISION AND DRAINAGE ABSCESS Left 02/17/2012   Procedure:  DRAINAGEof recurrent breast abscess;  Surgeon: Shelly Rubenstein, MD;  Location: [facility] OR;  Service: General;  Laterality: Left;  . LAPAROSCOPIC TUBAL LIGATION Bilateral 06/13/2013   Procedure: LAPAROSCOPIC TUBAL LIGATION;  Surgeon: Sherian Rein, MD;  Location: [facility] ORS;  Service: Gynecology;  Laterality: Bilateral;     OB History    Gravida  2   Para  2   Term  1   Preterm  1   AB      Living  2     SAB      TAB      Ectopic      Multiple      Live Births  2            Home Medications    Prior to Admission medications   Medication Sig Start Date End Date Taking? Authorizing Provider  albuterol (PROVENTIL HFA;VENTOLIN HFA) 108 (90 Base) MCG/ACT inhaler Inhale 2 puffs into the lungs every 4 (four) hours as needed for wheezing or shortness of breath. Can NOT refill until has an office visit. 07/20/17   Carney Living, MD  ibuprofen (ADVIL,MOTRIN) 800 MG tablet Take 1 tablet (  800 mg total) by mouth 3 (three) times daily. 05/24/17   Muthersbaugh, Dahlia Client, PA-C  meclizine (ANTIVERT) 25 MG tablet Take 1 tablet (25 mg total) by mouth 3 (three) times daily as needed for dizziness. 05/24/17   Muthersbaugh, Dahlia Client, PA-C    Family History Family History  Problem Relation Age of Onset  . Healthy Mother   . Anxiety disorder Mother   . Healthy Father   . Stroke Maternal Grandmother   . Stroke Paternal Grandfather   . Bipolar disorder Neg Hx   . Depression Neg Hx     Social History Social History   Tobacco Use  . Smoking status: Current Every Day Smoker    Packs/day: 1.00    Years: 5.00    Pack years: 5.00    Types: Cigarettes  . Smokeless tobacco: Never Used  Substance Use Topics  . Alcohol use: Yes     Comment: occasional  . Drug use: No     Allergies   Doxycycline   Review of Systems Review of Systems  All other systems reviewed and are negative.    Physical Exam Updated Vital Signs BP (!) 145/96 (BP Location: Right Arm)   Pulse 88   Temp 98.3 F (36.8 C) (Oral)   Resp 17   SpO2 98%   Physical Exam  Constitutional: She appears well-developed and well-nourished. No distress.  HENT:  Head: Atraumatic.  Right Ear: External ear normal.  Left Ear: External ear normal.  Mild rhinorrhea throat exam unremarkable  Eyes: Conjunctivae are normal.  Neck: Normal range of motion. Neck supple. No JVD present. No tracheal deviation present.  Cardiovascular: Normal rate and regular rhythm.  Pulmonary/Chest: Effort normal. She has wheezes (Inspiratory wheezes heard without rales or rhonchi).  Abdominal: Soft. There is no tenderness.  Musculoskeletal: She exhibits no edema.  Neurological: She is alert.  Skin: No rash noted.  Psychiatric: She has a normal mood and affect.  Nursing note and vitals reviewed.    ED Treatments / Results  Labs (all labs ordered are listed, but only abnormal results are displayed) Labs Reviewed - No data to display  EKG None  Radiology Dg Chest 2 View  Result Date: 10/01/2017 CLINICAL DATA:  Cough EXAM: CHEST - 2 VIEW COMPARISON:  January 19, 2017. FINDINGS: No edema or consolidation. The heart size and pulmonary vascularity are normal. No adenopathy. There is lower thoracic dextroscoliosis. IMPRESSION: No edema or consolidation. Electronically Signed   By: Bretta Bang III [name]   On: 10/01/2017 14:17    Procedures Procedures (including critical care time)  Medications Ordered in ED Medications  ipratropium-albuterol (DUONEB) 0.5-2.5 (3) MG/3ML nebulizer solution 3 mL (3 mLs Nebulization Given 10/01/17 1311)  predniSONE (DELTASONE) tablet 60 mg (60 mg Oral Given 10/01/17 1311)     Initial Impression / Assessment and Plan / ED Course    I have reviewed the triage vital signs and the nursing notes.  Pertinent labs & imaging results that were available during my care of the patient were reviewed by me and considered in my medical decision making (see chart for details).     BP (!) 145/96 (BP Location: Right Arm)   Pulse 88   Temp 98.3 F (36.8 C) (Oral)   Resp 17   SpO2 98%    Final Clinical Impressions(s) / ED Diagnoses   Final diagnoses:  Mild intermittent asthma with exacerbation    ED Discharge Orders         Ordered    predniSONE (  DELTASONE) 20 MG tablet     10/01/17 1645    benzonatate (TESSALON) 100 MG capsule  Every 8 hours     10/01/17 1645         3:37 PM Patient here with shortness of breath and wheezing suspicious of asthma palpation.  She reports some improvement with initial DuoNeb's.  He still has some wheezing, will repeat another treatment.  Steroid given.  PERC neg, doubt PE.    4:44 PM Improvement of symptoms after receiving breathing treatment.  Patient discharged home with an albuterol inhaler, steroid, and cough medication.  Return precautions discussed.  Patient ambulate while maintaining adequate oxygenation.   Fayrene Helper, PA-C 10/01/17 1645    Melene Plan, DO 10/04/17 [phone]

## 2017-12-28 ENCOUNTER — Encounter (HOSPITAL_COMMUNITY): Payer: Self-pay | Admitting: Emergency Medicine

## 2017-12-28 ENCOUNTER — Other Ambulatory Visit: Payer: Self-pay

## 2017-12-28 ENCOUNTER — Emergency Department (HOSPITAL_COMMUNITY)
Admission: EM | Admit: 2017-12-28 | Discharge: 2017-12-29 | Disposition: A | Discharge status: Home / Self Care | Payer: Self-pay | Attending: Emergency Medicine | Admitting: Emergency Medicine

## 2017-12-28 ENCOUNTER — Emergency Department (HOSPITAL_COMMUNITY): Payer: Self-pay

## 2017-12-28 DIAGNOSIS — Z79899 Other long term (current) drug therapy: Secondary | ICD-10-CM | POA: Insufficient documentation

## 2017-12-28 DIAGNOSIS — F17210 Nicotine dependence, cigarettes, uncomplicated: Secondary | ICD-10-CM | POA: Insufficient documentation

## 2017-12-28 DIAGNOSIS — J45909 Unspecified asthma, uncomplicated: Secondary | ICD-10-CM | POA: Insufficient documentation

## 2017-12-28 DIAGNOSIS — J029 Acute pharyngitis, unspecified: Secondary | ICD-10-CM | POA: Insufficient documentation

## 2017-12-28 LAB — CBC WITH DIFFERENTIAL/PLATELET
Abs Immature Granulocytes: 0.02 10*3/uL (ref 0.00–0.07)
Basophils Absolute: 0.1 10*3/uL (ref 0.0–0.1)
Basophils Relative: 1 %
Eosinophils Absolute: 0.2 10*3/uL (ref 0.0–0.5)
Eosinophils Relative: 2 %
HCT: 44.9 % (ref 36.0–46.0)
HEMOGLOBIN: 14.8 g/dL (ref 12.0–15.0)
Immature Granulocytes: 0 %
LYMPHS PCT: 31 %
Lymphs Abs: 2.6 10*3/uL (ref 0.7–4.0)
MCH: 33.2 pg (ref 26.0–34.0)
MCHC: 33 g/dL (ref 30.0–36.0)
MCV: 100.7 fL — ABNORMAL HIGH (ref 80.0–100.0)
Monocytes Absolute: 0.7 10*3/uL (ref 0.1–1.0)
Monocytes Relative: 8 %
Neutro Abs: 4.9 10*3/uL (ref 1.7–7.7)
Neutrophils Relative %: 58 %
Platelets: 329 10*3/uL (ref 150–400)
RBC: 4.46 MIL/uL (ref 3.87–5.11)
RDW: 12.7 % (ref 11.5–15.5)
WBC: 8.5 10*3/uL (ref 4.0–10.5)
nRBC: 0 % (ref 0.0–0.2)

## 2017-12-28 LAB — COMPREHENSIVE METABOLIC PANEL
ALT: 12 U/L (ref 0–44)
AST: 16 U/L (ref 15–41)
Albumin: 4 g/dL (ref 3.5–5.0)
Alkaline Phosphatase: 46 U/L (ref 38–126)
Anion gap: 7 (ref 5–15)
BUN: 5 mg/dL — ABNORMAL LOW (ref 6–20)
CALCIUM: 9 mg/dL (ref 8.9–10.3)
CO2: 27 mmol/L (ref 22–32)
Chloride: 104 mmol/L (ref 98–111)
Creatinine, Ser: 0.76 mg/dL (ref 0.44–1.00)
GFR calc Af Amer: >60 mL/min (ref >60)
GFR calc non Af Amer: >60 mL/min (ref >60)
GLUCOSE: 96 mg/dL (ref 70–99)
Potassium: 3.8 mmol/L (ref 3.5–5.1)
Sodium: 138 mmol/L (ref 135–145)
Total Bilirubin: 0.6 mg/dL (ref 0.3–1.2)
Total Protein: 6.5 g/dL (ref 6.5–8.1)

## 2017-12-28 LAB — I-STAT BETA HCG BLOOD, ED (MC, WL, AP ONLY): I-stat hCG, quantitative: <5 m[IU]/mL (ref <5)

## 2017-12-28 LAB — GROUP A STREP BY PCR: Group A Strep by PCR: NOT DETECTED

## 2017-12-28 MED ORDER — DEXAMETHASONE SODIUM PHOSPHATE 10 MG/ML IJ SOLN
10.0000 mg | Freq: Once | INTRAMUSCULAR | Status: AC
  Administered 2017-12-29: 10 mg via INTRAVENOUS
  Filled 2017-12-28: qty 1

## 2017-12-28 MED ORDER — IOHEXOL 300 MG/ML  SOLN
75.0000 mL | Freq: Once | INTRAMUSCULAR | Status: AC | PRN
  Administered 2017-12-28: 75 mL via INTRAVENOUS

## 2017-12-28 NOTE — ED Provider Notes (Signed)
[facility] EMERGENCY DEPARTMENT Provider Note   CSN: [id number] Arrival date & time: 12/28/17  1559     History   Chief Complaint Chief Complaint  Patient presents with  . Sore Throat  . Shortness of Breath    HPI [name] [name] is a 32 y.o. female.  The history is provided by the patient, medical records and a significant other. No language interpreter was used.  Sore Throat  This is a new problem. The current episode started more than 2 days ago. The problem occurs constantly. Pertinent negatives include no chest pain, no abdominal pain, no headaches and no shortness of breath (resolved). Nothing relieves the symptoms. She has tried nothing for the symptoms. The treatment provided no relief.    Past Medical History:  Diagnosis Date  . Admission for sterilization 06/12/2013  . Asthma   . Breast abscess   . Depression    HX of - not on med n, doing well.  . Eczema   . Family history of anesthesia complication    Grandmother has difficulty waskin up.  . Pneumonia    HX only -years ago has had a few times.  . Pregnant state, incidental   . S/P tubal ligation 06/13/2013  . SVD (spontaneous vaginal delivery)    x 2    Patient Active Problem List   Diagnosis Date Noted  . Tobacco use disorder 12/07/2014  . Major depressive disorder, recurrent episode, moderate (HCC) 01/13/2014  . GAD (generalized anxiety disorder) 01/13/2014  . Tinea pedis of both feet 09/10/2013  . S/P tubal ligation 06/13/2013  . Admission for sterilization 06/12/2013  . Rash and nonspecific skin eruption 06/09/2013  . Neck discomfort 02/26/2013  . Tachycardia 10/23/2012  . Allergic conjunctivitis of both eyes and rhinitis 10/14/2012  . Mastitis, Left. 11/27/2011  . Depression 03/24/2011  . Asthma 03/01/2006  . Eczema 03/01/2006    Past Surgical History:  Procedure Laterality Date  . INCISE AND DRAIN ABCESS     left breast x 2  . INCISION AND DRAINAGE ABSCESS Left  02/17/2012   Procedure:  DRAINAGEof recurrent breast abscess;  Surgeon: Shelly Rubenstein, MD;  Location: [facility] OR;  Service: General;  Laterality: Left;  . LAPAROSCOPIC TUBAL LIGATION Bilateral 06/13/2013   Procedure: LAPAROSCOPIC TUBAL LIGATION;  Surgeon: Sherian Rein, MD;  Location: [facility] ORS;  Service: Gynecology;  Laterality: Bilateral;     OB History    Gravida  2   Para  2   Term  1   Preterm  1   AB      Living  2     SAB      TAB      Ectopic      Multiple      Live Births  2            Home Medications    Prior to Admission medications   Medication Sig Start Date End Date Taking? Authorizing Provider  albuterol (PROVENTIL HFA;VENTOLIN HFA) 108 (90 Base) MCG/ACT inhaler Inhale 2 puffs into the lungs every 4 (four) hours as needed for wheezing or shortness of breath. Can NOT refill until has an office visit. Patient taking differently: Inhale 2 puffs into the lungs every 4 (four) hours as needed for wheezing or shortness of breath.  07/20/17   Carney Living, MD  benzonatate (TESSALON) 100 MG capsule Take 1 capsule (100 mg total) by mouth every 8 (eight) hours. 10/01/17   Fayrene Helper, PA-C  EPINEPHrine (  PRIMATENE MIST) 0.125 MG/ACT AERO Inhale 2 puffs into the lungs 4 (four) times daily as needed (shortness of breath, coughing, or wheezing).     [provider]  predniSONE (DELTASONE) 20 MG tablet 3 tabs po day one, then 2 tabs daily x 4 days 10/01/17   Fayrene Helper, PA-C    Family History Family History  Problem Relation Age of Onset  . Healthy Mother   . Anxiety disorder Mother   . Healthy Father   . Stroke Maternal Grandmother   . Stroke Paternal Grandfather   . Bipolar disorder Neg Hx   . Depression Neg Hx     Social History Social History   Tobacco Use  . Smoking status: Current Every Day Smoker    Packs/day: 1.00    Years: 5.00    Pack years: 5.00    Types: Cigarettes  . Smokeless tobacco: Never Used  Substance Use Topics   . Alcohol use: Yes    Comment: occasional  . Drug use: No     Allergies   Doxycycline   Review of Systems Review of Systems  Constitutional: Negative for chills, diaphoresis, fatigue, fever and unexpected weight change.  HENT: Negative for congestion.   Eyes: Negative for visual disturbance.  Respiratory: Negative for cough, chest tightness, shortness of breath (resolved), wheezing and stridor.   Cardiovascular: Negative for chest pain, palpitations and leg swelling.  Gastrointestinal: Negative for abdominal pain, constipation, diarrhea, nausea and vomiting.  Genitourinary: Negative for dysuria and flank pain.  Musculoskeletal: Negative for back pain, neck pain and neck stiffness.  Skin: Negative for rash and wound.  Neurological: Negative for light-headedness, numbness and headaches.  Psychiatric/Behavioral: Negative for agitation.  All other systems reviewed and are negative.    Physical Exam Updated Vital Signs BP 118/80   Pulse 79   Temp 97.9 F (36.6 C)   Resp 18   Ht 5' (1.524 m)   Wt 68 kg   SpO2 97%   BMI 29.29 kg/m   Physical Exam Vitals signs and nursing note reviewed.  Constitutional:      General: She is not in acute distress.    Appearance: She is well-developed. She is not ill-appearing, toxic-appearing or diaphoretic.  HENT:     Head: Normocephalic and atraumatic.     Right Ear: External ear normal.     Left Ear: External ear normal.     Nose: Nose normal. No congestion or rhinorrhea.     Mouth/Throat:     Mouth: Mucous membranes are moist.     Pharynx: Uvula midline. Oropharyngeal exudate and posterior oropharyngeal erythema present. No pharyngeal swelling or uvula swelling.     Tonsils: Tonsillar exudate present. Swelling: 0 on the right. 0 on the left.  Eyes:     Conjunctiva/sclera: Conjunctivae normal.     Pupils: Pupils are equal, round, and reactive to light.  Neck:     Musculoskeletal: Normal range of motion and neck supple.    Cardiovascular:     Rate and Rhythm: Normal rate.     Heart sounds: No murmur.  Pulmonary:     Effort: No respiratory distress.     Breath sounds: No stridor.  Abdominal:     General: There is no distension.     Tenderness: There is no abdominal tenderness. There is no rebound.  Skin:    General: Skin is warm.     Capillary Refill: Capillary refill takes less than 2 seconds.     Findings: No erythema or  rash.  Neurological:     Mental Status: She is alert and oriented to person, place, and time.     Motor: No abnormal muscle tone.     Coordination: Coordination normal.     Deep Tendon Reflexes: Reflexes are normal and symmetric.      ED Treatments / Results  Labs (all labs ordered are listed, but only abnormal results are displayed) Labs Reviewed  CBC WITH DIFFERENTIAL/PLATELET - Abnormal; Notable for the following components:      Result Value   MCV 100.7 (*)    All other components within normal limits  COMPREHENSIVE METABOLIC PANEL - Abnormal; Notable for the following components:   BUN 5 (*)    All other components within normal limits  GROUP A STREP BY PCR  HCG, SERUM, QUALITATIVE  I-STAT BETA HCG BLOOD, ED ([facility], [facility], [facility] ONLY)    EKG EKG Interpretation  Date/Time:  Friday December 28 2017 16:13:35 EST Ventricular Rate:  108 PR Interval:  156 QRS Duration: 78 QT Interval:  336 QTC Calculation: 450 R Axis:   84 Text Interpretation:  Sinus tachycardia Otherwise normal ECG When compared to prior,  faster rate.  No STEMI Confirmed by Theda Belfastegeler, Chris ([id number]) on 12/28/2017 7:33:20 PM   Radiology Ct Soft Tissue Neck W Contrast  Result Date: 12/28/2017 CLINICAL DATA:  Initial evaluation for acute sore throat, stridor. Epiglottitis or tonsillitis suspected. EXAM: CT NECK WITH CONTRAST TECHNIQUE: Multidetector CT imaging of the neck was performed using the standard protocol following the bolus administration of intravenous contrast. CONTRAST:  75mL OMNIPAQUE IOHEXOL  300 MG/ML  SOLN COMPARISON:  None available. FINDINGS: Pharynx and larynx: Oral cavity within normal limits without mass lesion or loculated fluid collection. Scattered dental caries noted. No acute inflammatory changes seen about the teeth. Palatine tonsils symmetric and within normal limits without evidence for acute tonsillitis. No tonsillar or peritonsillar abscess. Parapharyngeal fat maintained. Mild prominence of the adenoidal soft tissues. Nasopharynx otherwise unremarkable. No retropharyngeal collection. Epiglottis within normal limits. Vallecula clear. Remainder of the hypopharynx and supraglottic larynx within normal limits. Glottis is closed, limiting evaluation. Subglottic airway clear. Salivary glands: Salivary glands including the parotid and submandibular glands within normal limits. Thyroid: Thyroid normal. Lymph nodes: No pathologically enlarged lymph nodes identified within the neck. Vascular: Normal intravascular enhancement seen throughout the neck. Limited intracranial: Unremarkable. Visualized orbits: Visualized globes and orbital soft tissues within normal limits. Mastoids and visualized paranasal sinuses: Mild scattered mucosal thickening within the ethmoidal air cells and maxillary sinuses. Visualized paranasal sinuses are otherwise clear. Visualize mastoids and middle ear cavities are well pneumatized and free of fluid. Skeleton: No acute osseous abnormality. No discrete lytic or blastic osseous lesions. Upper chest: Visualized upper chest within normal limits. Partially visualized lungs are clear. Other: None. IMPRESSION: 1. No CT evidence for acute abnormality identified within the neck. 2. Poor dentition without associated acute inflammatory changes. Electronically Signed   By: Rise MuBenjamin  McClintock [name]   On: 12/28/2017 22:13    Procedures Procedures (including critical care time)  Medications Ordered in ED Medications  dexamethasone (DECADRON) injection 10 mg (has no  administration in time range)  iohexol (OMNIPAQUE) 300 MG/ML solution 75 mL (75 mLs Intravenous Contrast Given 12/28/17 2120)     Initial Impression / Assessment and Plan / ED Course  I have reviewed the triage vital signs and the nursing notes.  Pertinent labs & imaging results that were available during my care of the patient were reviewed by me and considered in  my medical decision making (see chart for details).     Denyce Harr is a 32 y.o. female with a past medical history significant for asthma, depression, eczema, anxiety, depression who presents with sore throat, sensation of throat tightening, and shortness of breath.  She reports that her asthma has been flaring up slightly more recently due to environmental changes however she reports that improved after home albuterol.  She denies any shortness of breath at this time.  She is really concerned about her sore throat.  She describes as moderate to severe but has had no difficulty swallowing.  She says that she has not had difficulty breathing this evening.  She denies any fevers, chills, chest pain, shortness of breath, nausea, vomiting, or other symptoms.  On exam, patient has posterior oropharyngeal erythema and some exudate.  No evidence of PTA or RPA.  Normal neck range of motion.  No trismus, no evidence of Ludwig angina on exam.  Normal tongue range of motion.  No stridor on exam.  Lungs were clear.  No wheezing.  Exam otherwise unremarkable.  On reassessment, patient reports he feels "there is a knot in my throat".  She feels there may be a pocket of infection.  Despite reassuring exam initially, due to the feeling that there is a swelling in her throat, patient will have CT to rule out abscess.  Patient had screening labs as well.  Labs are reassuring and CT scan showed no abnormality.  Suspect pharyngitis likely viral strep test was negative.  Patient given a dose of Decadron to help with the sensation of tightness  in the sensation of a knot.  Patient was able to eat and drink without difficulty.  Patient will follow-up with her PCP for further management of likely viral pharyngitis.  Patient understood return precautions for any worsening symptoms.  Patient had other questions or concerns and was discharged in good condition  Final Clinical Impressions(s) / ED Diagnoses   Final diagnoses:  Pharyngitis, unspecified etiology  Sore throat    ED Discharge Orders    None     Clinical Impression: 1. Pharyngitis, unspecified etiology   2. Sore throat     Disposition: Discharge  Condition: Good  I have discussed the results, Dx and Tx plan with the pt(& family if present). He/she/they expressed understanding and agree(s) with the plan. Discharge instructions discussed at great length. Strict return precautions discussed and pt &/or family have verbalized understanding of the instructions. No further questions at time of discharge.    New Prescriptions   No medications on file    Follow Up: Carney Living, MD [address] [place] [address] [phone]     [facility] EMERGENCY DEPARTMENT [address] [id number] mc [place] [address] [phone]       Jedediah Noda, Canary Brim, MD 12/29/17 0000

## 2017-12-28 NOTE — Discharge Instructions (Signed)
Your exam today showed redness and discharge in the back of her throat consistent with a pharyngitis.  Strep test was negative.  We did the picture which did not show any evidence of abscess.  You received steroids to help with the sensation you are feeling, please use over-the-counter medications to help with your discomfort and follow-up with your primary doctor.  Please stay hydrated and rest.  If any symptoms change or worsen, please return to the nearest emergency department.

## 2017-12-28 NOTE — ED Notes (Signed)
Assumed care of pt for IV placement and lab draw.  Denies any needs at this time.  Updated on plan of care.  Family at bedside.

## 2017-12-28 NOTE — ED Notes (Signed)
Pt given sprite, crackers, and peanut butter.

## 2017-12-28 NOTE — ED Triage Notes (Signed)
Pt states that she began feeling short of breath and that her throat is closing states that this started at approx 9-10am and wants to be evaluated because it hasn't resolved. Hx of asthma states that she used her inhaler at 3pm with no improvement.

## 2017-12-28 NOTE — ED Notes (Signed)
Brought pt back to treatment room.  Pt placed on pulse ox and BP monitor.

## 2017-12-29 NOTE — ED Notes (Signed)
Patient verbalizes understanding of discharge instructions. Opportunity for questioning and answers were provided. Armband removed by staff, pt discharged from ED home via POV with family. 

## 2018-01-03 ENCOUNTER — Encounter (HOSPITAL_COMMUNITY): Payer: Self-pay | Admitting: Emergency Medicine

## 2018-01-03 ENCOUNTER — Emergency Department (HOSPITAL_COMMUNITY)
Admission: EM | Admit: 2018-01-03 | Discharge: 2018-01-03 | Disposition: A | Discharge status: Home / Self Care | Payer: Medicaid Other | Attending: Emergency Medicine | Admitting: Emergency Medicine

## 2018-01-03 ENCOUNTER — Emergency Department (HOSPITAL_COMMUNITY): Payer: Medicaid Other

## 2018-01-03 DIAGNOSIS — S60921A Unspecified superficial injury of right hand, initial encounter: Secondary | ICD-10-CM | POA: Insufficient documentation

## 2018-01-03 DIAGNOSIS — M79641 Pain in right hand: Secondary | ICD-10-CM

## 2018-01-03 DIAGNOSIS — W2209XA Striking against other stationary object, initial encounter: Secondary | ICD-10-CM | POA: Insufficient documentation

## 2018-01-03 DIAGNOSIS — J029 Acute pharyngitis, unspecified: Secondary | ICD-10-CM

## 2018-01-03 DIAGNOSIS — Y9389 Activity, other specified: Secondary | ICD-10-CM | POA: Insufficient documentation

## 2018-01-03 DIAGNOSIS — Y999 Unspecified external cause status: Secondary | ICD-10-CM | POA: Insufficient documentation

## 2018-01-03 DIAGNOSIS — F17210 Nicotine dependence, cigarettes, uncomplicated: Secondary | ICD-10-CM | POA: Insufficient documentation

## 2018-01-03 DIAGNOSIS — Y929 Unspecified place or not applicable: Secondary | ICD-10-CM | POA: Insufficient documentation

## 2018-01-03 DIAGNOSIS — J45909 Unspecified asthma, uncomplicated: Secondary | ICD-10-CM | POA: Insufficient documentation

## 2018-01-03 MED ORDER — LIDOCAINE VISCOUS HCL 2 % MT SOLN
15.0000 mL | Freq: Once | OROMUCOSAL | Status: AC
  Administered 2018-01-03: 15 mL via ORAL
  Filled 2018-01-03: qty 15

## 2018-01-03 MED ORDER — FAMOTIDINE 20 MG PO TABS: 20.0000 mg | ORAL_TABLET | Freq: Two times a day (BID) | ORAL | 0 refills | Status: DC

## 2018-01-03 MED ORDER — CEPHALEXIN 250 MG PO CAPS
500.0000 mg | ORAL_CAPSULE | Freq: Once | ORAL | Status: AC
  Administered 2018-01-03: 500 mg via ORAL
  Filled 2018-01-03: qty 2

## 2018-01-03 MED ORDER — CEPHALEXIN 500 MG PO CAPS: 500.0000 mg | ORAL_CAPSULE | Freq: Four times a day (QID) | ORAL | 0 refills | Status: AC

## 2018-01-03 MED ORDER — ALUM & MAG HYDROXIDE-SIMETH 200-200-20 MG/5ML PO SUSP
30.0000 mL | Freq: Once | ORAL | Status: AC
  Administered 2018-01-03: 30 mL via ORAL
  Filled 2018-01-03: qty 30

## 2018-01-03 NOTE — Discharge Instructions (Addendum)
1. Medications: Please take all of your antibiotics until finished!   You may develop abdominal discomfort or diarrhea from the antibiotic.  You may help offset this with probiotics which you can buy or get in yogurt. Do not eat  or take the probiotics until 2 hours after your antibiotic.  Alternate 600 mg of ibuprofen and [phone] mg of Tylenol every 3 hours as needed for pain. Do not exceed 4000 mg of Tylenol daily.  Take ibuprofen with food to avoid upset stomach issues.   Start taking Pepcid twice daily.  Avoid fried foods, spicy foods, fatty foods, alcohol, or acidic foods which could exacerbate your symptoms.  You may also find it helpful to elevate the head of the bed.  For sore throat you can also use warm water salt gargles, warm teas, over-the-counter Chloraseptic spray, honey  2. Treatment: rest, ice, elevate and use splint as needed , drink plenty of fluids, gentle stretching 3. Follow Up: Please followup with the orthopedic hand doctor as directed or your PCP in 1 week if no improvement for discussion of your diagnoses and further evaluation after today's visit; if you do not have a primary care doctor use the resource guide provided to find one; Please return to the ER for worsening symptoms or other concerns such as worsening swelling, redness of the skin, fevers, loss of pulses, or loss of feeling

## 2018-01-03 NOTE — ED Triage Notes (Signed)
R hand pain pt stated she hit her hand on something but does not know what. Pt also c/o throat pain was seen for this last week.

## 2018-01-03 NOTE — ED Provider Notes (Signed)
[facility] EMERGENCY DEPARTMENT Provider Note   CSN: [id number] Arrival date & time: 01/03/18  1642     History   Chief Complaint No chief complaint on file.   HPI [name] [name] is a 33 y.o. female.  With history of asthma presents for evaluation of gradual onset, progressively worsening sore throat for 2 weeks.  Was seen and evaluated for this on 12/29/2017 with reassuring work-up and was diagnosed with pharyngitis and discharged home.  She states that for the past 2 days her symptoms have worsened and she now feels a stabbing sensation in her throat which worsens with attempts to eat.  Denies choking, fevers, throat tightness, shortness of breath, chest pain, abdominal pain, nausea, or vomiting.  Has been taking ibuprofen and Tylenol without significant relief.  Also notes acute onset, constant right fifth digit pain for 2 days.  States that on New Year's Eve while intoxicated she punched a wall with her right hand and noted immediate onset of pain, ecchymosis, and swelling.  Now currently notes constant burning sensation to the right fifth digit.  More mild pain to the right fourth digit.  Pain does not radiate.  Denies numbness or tingling.  Pain worsens with movement and palpation.  Has not tried anything for her symptoms but states swelling and ecchymosis have improved.  He is right-hand dominant.  The history is provided by the patient.    Past Medical History:  Diagnosis Date  . Admission for sterilization 06/12/2013  . Asthma   . Breast abscess   . Depression    HX of - not on med n, doing well.  . Eczema   . Family history of anesthesia complication    Grandmother has difficulty waskin up.  . Pneumonia    HX only -years ago has had a few times.  . Pregnant state, incidental   . S/P tubal ligation 06/13/2013  . SVD (spontaneous vaginal delivery)    x 2    Patient Active Problem List   Diagnosis Date Noted  . Tobacco use disorder 12/07/2014  .  Major depressive disorder, recurrent episode, moderate (HCC) 01/13/2014  . GAD (generalized anxiety disorder) 01/13/2014  . Tinea pedis of both feet 09/10/2013  . S/P tubal ligation 06/13/2013  . Admission for sterilization 06/12/2013  . Rash and nonspecific skin eruption 06/09/2013  . Neck discomfort 02/26/2013  . Tachycardia 10/23/2012  . Allergic conjunctivitis of both eyes and rhinitis 10/14/2012  . Mastitis, Left. 11/27/2011  . Depression 03/24/2011  . Asthma 03/01/2006  . Eczema 03/01/2006    Past Surgical History:  Procedure Laterality Date  . INCISE AND DRAIN ABCESS     left breast x 2  . INCISION AND DRAINAGE ABSCESS Left 02/17/2012   Procedure:  DRAINAGEof recurrent breast abscess;  Surgeon: Shelly Rubenstein, MD;  Location: [facility] OR;  Service: General;  Laterality: Left;  . LAPAROSCOPIC TUBAL LIGATION Bilateral 06/13/2013   Procedure: LAPAROSCOPIC TUBAL LIGATION;  Surgeon: Sherian Rein, MD;  Location: [facility] ORS;  Service: Gynecology;  Laterality: Bilateral;     OB History    Gravida  2   Para  2   Term  1   Preterm  1   AB      Living  2     SAB      TAB      Ectopic      Multiple      Live Births  2  Home Medications    Prior to Admission medications   Medication Sig Start Date End Date Taking? Authorizing Provider  albuterol (PROVENTIL HFA;VENTOLIN HFA) 108 (90 Base) MCG/ACT inhaler Inhale 2 puffs into the lungs every 4 (four) hours as needed for wheezing or shortness of breath. Can NOT refill until has an office visit. Patient not taking: Reported on 12/28/2017 07/20/17   Carney Livinghambliss, Marshall L, MD  benzonatate (TESSALON) 100 MG capsule Take 1 capsule (100 mg total) by mouth every 8 (eight) hours. Patient not taking: Reported on 12/28/2017 10/01/17   Fayrene Helperran, Bowie, PA-C  cephALEXin (KEFLEX) 500 MG capsule Take 1 capsule (500 mg total) by mouth 4 (four) times daily for 5 days. 01/03/18 01/08/18  Michela PitcherFawze, Bruin Bolger A, PA-C  famotidine (PEPCID) 20  MG tablet Take 1 tablet (20 mg total) by mouth 2 (two) times daily. 01/03/18   Luevenia MaxinFawze, Marsia Cino A, PA-C  predniSONE (DELTASONE) 20 MG tablet 3 tabs po day one, then 2 tabs daily x 4 days Patient not taking: Reported on 12/28/2017 10/01/17   Fayrene Helperran, Bowie, PA-C    Family History Family History  Problem Relation Age of Onset  . Healthy Mother   . Anxiety disorder Mother   . Healthy Father   . Stroke Maternal Grandmother   . Stroke Paternal Grandfather   . Bipolar disorder Neg Hx   . Depression Neg Hx     Social History Social History   Tobacco Use  . Smoking status: Current Every Day Smoker    Packs/day: 1.00    Years: 5.00    Pack years: 5.00    Types: Cigarettes  . Smokeless tobacco: Never Used  Substance Use Topics  . Alcohol use: Yes    Comment: occasional  . Drug use: No     Allergies   Doxycycline   Review of Systems Review of Systems  HENT: Positive for sore throat.   Respiratory: Negative for shortness of breath.   Cardiovascular: Negative for chest pain.  Gastrointestinal: Negative for abdominal pain, diarrhea and vomiting.  Musculoskeletal: Positive for arthralgias.  Skin: Positive for color change and wound.  Neurological: Negative for numbness.     Physical Exam Updated Vital Signs BP (!) 132/91 (BP Location: Left Arm)   Pulse 97   Temp 98.6 F (37 C) (Oral)   Resp 17   Ht 5' (1.524 m)   Wt 68 kg   SpO2 96%   BMI 29.28 kg/m   Physical Exam Vitals signs and nursing note reviewed.  Constitutional:      General: She is not in acute distress.    Appearance: She is well-developed.  HENT:     Head: Normocephalic and atraumatic.     Nose: Nose normal.     Mouth/Throat:     Pharynx: Posterior oropharyngeal erythema present.     Comments: Posterior pharynx with erythema.  No tonsillar hypertrophy, exudates, uvular deviation, trismus, or sublingual abnormalities.  Tolerating secretions without difficulty Eyes:     General:        Right eye: No  discharge.        Left eye: No discharge.     Conjunctiva/sclera: Conjunctivae normal.  Neck:     Musculoskeletal: Normal range of motion and neck supple. No neck rigidity.     Vascular: No JVD.     Trachea: No tracheal deviation.  Cardiovascular:     Rate and Rhythm: Normal rate.     Pulses: Normal pulses.     Comments: 2+ radial pulses bilaterally  Pulmonary:     Effort: Pulmonary effort is normal.  Abdominal:     General: There is no distension.     Tenderness: There is no abdominal tenderness.  Musculoskeletal:        General: Tenderness present.     Comments: Mild swelling and erythema to the dorsum of the right fourth and fifth digits.  Superficial abrasions noted to the dorsum of these digits.  No active drainage.  Tenderness to palpation of the right fourth and fifth digits diffusely with no focal tenderness.  No crepitus.  No snuffbox tenderness.  5/5 strength of bilateral wrist and digits with flexion and extension against resistance.  Good grip strength bilaterally.  Lymphadenopathy:     Cervical: No cervical adenopathy.  Skin:    General: Skin is warm and dry.     Findings: No erythema.  Neurological:     Mental Status: She is alert.     Comments: Fluent speech, no facial droop, sensation intact to soft touch of bilateral hands.  Good grip strength bilaterally.  Psychiatric:        Behavior: Behavior normal.      ED Treatments / Results  Labs (all labs ordered are listed, but only abnormal results are displayed) Labs Reviewed - No data to display  EKG None  Radiology Dg Hand Complete Right  Result Date: 01/03/2018 CLINICAL DATA:  Hand pain after injury. Prior fifth metacarpal fracture EXAM: RIGHT HAND - COMPLETE 3+ VIEW COMPARISON:  Radiograph 09/11/2014 FINDINGS: Remote fifth metacarpal fracture with mild residual posttraumatic deformity. No acute fracture. Alignment is maintained. The joint spaces are maintained. No focal soft tissue abnormality. No radiopaque  foreign body. IMPRESSION: No acute fracture or dislocation of the right hand. Electronically Signed   By: Narda Rutherford [name]   On: 01/03/2018 19:13    Procedures Procedures (including critical care time)  Medications Ordered in ED Medications  alum & mag hydroxide-simeth (MAALOX/MYLANTA) 200-200-20 MG/5ML suspension 30 mL (30 mLs Oral Given 01/03/18 1945)    And  lidocaine (XYLOCAINE) 2 % viscous mouth solution 15 mL (15 mLs Oral Given 01/03/18 1945)  cephALEXin (KEFLEX) capsule 500 mg (500 mg Oral Given 01/03/18 1945)     Initial Impression / Assessment and Plan / ED Course  I have reviewed the triage vital signs and the nursing notes.  Pertinent labs & imaging results that were available during my care of the patient were reviewed by me and considered in my medical decision making (see chart for details).     Patient presenting for evaluation of ongoing sore throat and acute onset of right hand pain.  She is afebrile, vital signs are stable.  She is neurovascularly intact.  She has some healing superficial wounds to the dorsum of the left hand with some erythema and swelling.  No drainage, no evidence of flexor tenosynovitis or felon.  Will cover with a course of Keflex and discussed wound care.  Radiographs show no evidence of acute osseous abnormality.  No snuffbox tenderness on examination.  Fingers were buddy taped for comfort, recommend follow-up with PCP or hand surgery for reevaluation.  With regards to her sore throat, no evidence of peritonsillar abscess, Ludwig's angina, retropharyngeal abscess, or strep pharyngitis.  She recently had imaging of the neck which showed no concerning pathology and she was discharged with treatment for pharyngitis.  I suspect there may be a component of acid reflux to her symptoms as well.  Discussed lifestyle modification and dietary modifications and will discharge  with a course of Pepcid.  Also recommend symptomatic management with honey, warm tea,  Chloraseptic spray, lozenges, etc. recommend follow-up with PCP if symptoms persist.   Discussed strict ED return precautions. Pt verbalized understanding of and agreement with plan and is safe for discharge home at this time.   Final Clinical Impressions(s) / ED Diagnoses   Final diagnoses:  Sore throat  Right hand pain    ED Discharge Orders         Ordered    cephALEXin (KEFLEX) 500 MG capsule  4 times daily     01/03/18 1935    famotidine (PEPCID) 20 MG tablet  2 times daily     01/03/18 1935           Bennye AlmFawze, Raywood Wailes A, PA-C 01/04/18 1531    Rolan BuccoBelfi, Melanie, MD 01/05/18 [phone]

## 2018-01-03 NOTE — ED Notes (Signed)
Discharge instructions and prescriptions discussed with Pt. Pt verbalized understanding. Pt stable and ambulatory.   

## 2018-07-03 ENCOUNTER — Other Ambulatory Visit: Payer: Self-pay

## 2018-07-03 DIAGNOSIS — Z20822 Contact with and (suspected) exposure to covid-19: Secondary | ICD-10-CM

## 2018-07-08 LAB — NOVEL CORONAVIRUS, NAA: SARS-CoV-2, NAA: NOT DETECTED

## 2018-09-08 ENCOUNTER — Encounter (HOSPITAL_COMMUNITY): Payer: Self-pay | Admitting: Emergency Medicine

## 2018-09-08 ENCOUNTER — Other Ambulatory Visit: Payer: Self-pay

## 2018-09-08 ENCOUNTER — Emergency Department (HOSPITAL_COMMUNITY)
Admission: EM | Admit: 2018-09-08 | Discharge: 2018-09-09 | Disposition: A | Discharge status: Home / Self Care | Payer: Medicaid Other | Attending: Emergency Medicine | Admitting: Emergency Medicine

## 2018-09-08 DIAGNOSIS — R030 Elevated blood-pressure reading, without diagnosis of hypertension: Secondary | ICD-10-CM

## 2018-09-08 DIAGNOSIS — L239 Allergic contact dermatitis, unspecified cause: Secondary | ICD-10-CM | POA: Insufficient documentation

## 2018-09-08 DIAGNOSIS — Z79899 Other long term (current) drug therapy: Secondary | ICD-10-CM | POA: Insufficient documentation

## 2018-09-08 DIAGNOSIS — F17210 Nicotine dependence, cigarettes, uncomplicated: Secondary | ICD-10-CM | POA: Insufficient documentation

## 2018-09-08 NOTE — ED Triage Notes (Signed)
C/o rash to bilateral hands- fingers peeling and blistering x 2 days.

## 2018-09-09 MED ORDER — PREDNISONE 20 MG PO TABS
60.0000 mg | ORAL_TABLET | Freq: Once | ORAL | Status: AC
  Administered 2018-09-09: 60 mg via ORAL
  Filled 2018-09-09: qty 3

## 2018-09-09 MED ORDER — DIPHENHYDRAMINE HCL 25 MG PO CAPS
25.0000 mg | ORAL_CAPSULE | Freq: Once | ORAL | Status: AC
  Administered 2018-09-09: 25 mg via ORAL
  Filled 2018-09-09: qty 1

## 2018-09-09 MED ORDER — PREDNISONE 20 MG PO TABS: 60.0000 mg | ORAL_TABLET | Freq: Every day | ORAL | 0 refills | Status: DC

## 2018-09-09 NOTE — Discharge Instructions (Signed)
Take diphenhydramine (Benadryl) as needed for itching.  Your blood pressure was a little high today .

## 2018-09-09 NOTE — ED Provider Notes (Signed)
[facility]   CSN: [id number] Arrival date & time: 09/08/18  2223    History   Chief Complaint Chief Complaint  Patient presents with  . Rash    HPI [name] [name] is a 33 y.o. female.   The history is provided by the patient.  She has history of eczema and comes in with a rash on her hands over the last 3 days.  Rash started on her left index finger and has spread to involve her entire hands bilaterally.  There are blisters and skin has been peeling.  She denies any rash anywhere else.  She denies any difficulty breathing or swallowing.  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  Past Medical History:  Diagnosis Date  . Admission for sterilization 06/12/2013  . Asthma   . Breast abscess   . Depression    HX of - not on med n, doing well.  . Eczema   . Family history of anesthesia complication    Grandmother has difficulty waskin up.  . Pneumonia    HX only -years ago has had a few times.  . Pregnant state, incidental   . S/P tubal ligation 06/13/2013  . SVD (spontaneous vaginal delivery)    x 2    Patient Active Problem List   Diagnosis Date Noted  . Tobacco use disorder 12/07/2014  . Major depressive disorder, recurrent episode, moderate (HCC) 01/13/2014  . GAD (generalized anxiety disorder) 01/13/2014  . Tinea pedis of both feet 09/10/2013  . S/P tubal ligation 06/13/2013  . Admission for sterilization 06/12/2013  . Rash and nonspecific skin eruption 06/09/2013  . Neck discomfort 02/26/2013  . Tachycardia 10/23/2012  . Allergic conjunctivitis of both eyes and rhinitis 10/14/2012  . Mastitis, Left. 11/27/2011  . Depression 03/24/2011  . Asthma 03/01/2006  . Eczema 03/01/2006    Past Surgical History:  Procedure Laterality Date  . INCISE AND DRAIN ABCESS     left breast x 2  . INCISION AND DRAINAGE ABSCESS Left 02/17/2012   Procedure:  DRAINAGEof recurrent breast abscess;  Surgeon: Shelly Rubensteinouglas A Blackman, MD;  Location: [facility] OR;  Service: General;  Laterality: Left;  . LAPAROSCOPIC TUBAL LIGATION Bilateral 06/13/2013   Procedure: LAPAROSCOPIC TUBAL LIGATION;  Surgeon: Sherian ReinJody Bovard-Stuckert, MD;  Location: [facility] OR[facility];  Service: Gynecology;  Laterality: Bilateral;     OB History    Gravida  2   Para  2   Term  1   Preterm  1   AB      Living  2     SAB      TAB      Ectopic      Multiple      Live Births  2            Home Medications    Prior to Admission medications   Medication Sig Start Date End Date Taking? Authorizing Provider  albuterol (PROVENTIL HFA;VENTOLIN HFA) 108 (90 Base) MCG/ACT inhaler Inhale 2 puffs into the lungs every 4 (four) hours as needed for wheezing or shortness of breath. Can NOT refill until has an office visit. Patient not taking: Reported on 12/28/2017 07/20/17   Carney Livinghambliss, Marshall L, MD  benzonatate (TESSALON) 100 MG capsule Take 1 capsule (100 mg total) by mouth every 8 (eight) hours. Patient not taking: Reported on 12/28/2017 10/01/17   Fayrene Helperran, Bowie, PA-C  famotidine (PEPCID) 20  MG tablet Take 1 tablet (20 mg total) by mouth 2 (two) times daily. 01/03/18   Nils Flack, Mina A, PA-C  predniSONE (DELTASONE) 20 MG tablet 3 tabs po day one, then 2 tabs daily x 4 days Patient not taking: Reported on 12/28/2017 10/01/17   Domenic Moras, PA-C    Family History Family History  Problem Relation Age of Onset  . Healthy Mother   . Anxiety disorder Mother   . Healthy Father   . Stroke Maternal Grandmother   . Stroke Paternal Grandfather   . Bipolar disorder Neg Hx   . Depression Neg Hx     Social History Social History   Tobacco Use  . Smoking status: Current Every Day Smoker    Packs/day: 1.00    Years: 5.00    Pack years: 5.00    Types: Cigarettes  . Smokeless tobacco: Never Used  Substance Use Topics  . Alcohol use: Yes    Comment: occasional  . Drug use: No      Allergies   Doxycycline   Review of Systems Review of Systems  All other systems reviewed and are negative.    Physical Exam Updated Vital Signs BP (!) 141/87 (BP Location: Left Arm)   Pulse 87   Temp 98.4 F (36.9 C) (Oral)   Resp 18   LMP 09/06/2018   SpO2 96%   Physical Exam Vitals signs and nursing note reviewed.    33 year old female, resting comfortably and in no acute distress. Vital signs are significant for elevated blood pressure. Oxygen saturation is 96%, which is normal. Head is normocephalic and atraumatic. PERRLA, EOMI. Oropharynx is clear. Neck is nontender and supple without adenopathy or JVD. Back is nontender and there is no CVA tenderness. Lungs are clear without rales, wheezes, or rhonchi. Chest is nontender. Heart has regular rate and rhythm without murmur. Abdomen is soft, flat, nontender without masses or hepatosplenomegaly and peristalsis is normoactive. Extremities have no cyanosis or edema, full range of motion is present. Skin: Rash is present involving both hands.  There is some swelling and some thickening of the skin and some areas of vesicle formation and a few areas with some mild desquamation.  Overall picture is most consistent with contact dermatitis. Neurologic: Mental status is normal, cranial nerves are intact, there are no motor or sensory deficits.  ED Treatments / Results   Procedures Procedures  Medications Ordered in ED Medications  predniSONE (DELTASONE) tablet 60 mg (60 mg Oral Given 09/09/18 0243)  diphenhydrAMINE (BENADRYL) capsule 25 mg (25 mg Oral Given 09/09/18 0243)     Initial Impression / Assessment and Plan / ED Course  I have reviewed the triage vital signs and the nursing notes.  Contact dermatitis, unclear what she was reacting to.  She is discharged with prescription for prednisone and told to use over-the-counter diphenhydramine as needed for itching.  Advised to have her blood pressure rechecked to make sure  it is coming down.  Follow-up with PCP.  Old records are reviewed, and she does have a prior ED visit for her contact dermatitis, several visits for exacerbation of eczema  Final Clinical Impressions(s) / ED Diagnoses   Final diagnoses:  Allergic contact dermatitis, unspecified trigger    ED Discharge Orders         Ordered    predniSONE (DELTASONE) 20 MG tablet  Daily     09/09/18 1497           Delora Fuel, MD 02[birth year]78 [phone]

## 2018-10-02 ENCOUNTER — Other Ambulatory Visit: Payer: Self-pay

## 2018-10-02 DIAGNOSIS — Z20822 Contact with and (suspected) exposure to covid-19: Secondary | ICD-10-CM

## 2018-10-03 LAB — NOVEL CORONAVIRUS, NAA: SARS-CoV-2, NAA: NOT DETECTED

## 2018-11-21 ENCOUNTER — Emergency Department (HOSPITAL_COMMUNITY)
Admission: EM | Admit: 2018-11-21 | Discharge: 2018-11-21 | Disposition: A | Discharge status: Home / Self Care | Payer: Self-pay | Attending: Emergency Medicine | Admitting: Emergency Medicine

## 2018-11-21 ENCOUNTER — Other Ambulatory Visit: Payer: Self-pay

## 2018-11-21 ENCOUNTER — Encounter (HOSPITAL_COMMUNITY): Payer: Self-pay | Admitting: Emergency Medicine

## 2018-11-21 DIAGNOSIS — M79652 Pain in left thigh: Secondary | ICD-10-CM | POA: Insufficient documentation

## 2018-11-21 DIAGNOSIS — F17210 Nicotine dependence, cigarettes, uncomplicated: Secondary | ICD-10-CM | POA: Insufficient documentation

## 2018-11-21 LAB — POC URINE PREG, ED: Preg Test, Ur: NEGATIVE

## 2018-11-21 MED ORDER — IBUPROFEN 600 MG PO TABS: 600.0000 mg | ORAL_TABLET | Freq: Four times a day (QID) | ORAL | 0 refills | Status: DC | PRN

## 2018-11-21 MED ORDER — CYCLOBENZAPRINE HCL 10 MG PO TABS: 10.0000 mg | ORAL_TABLET | Freq: Two times a day (BID) | ORAL | 0 refills | Status: DC | PRN

## 2018-11-21 MED ORDER — GABAPENTIN 100 MG PO CAPS: 100.0000 mg | ORAL_CAPSULE | Freq: Three times a day (TID) | ORAL | 0 refills | Status: DC

## 2018-11-21 NOTE — Discharge Instructions (Signed)
What is meralgia paresthetica? — Meralgia paresthetica is a condition that causes pain, tingling, and numbness in the outer thigh. It happens when a nerve in that area gets squeezed or compressed.  Different things can cause meralgia paresthetica. They include pregnancy, wearing tight belts or waistbands, leaning the thigh on something for a long time, and injury to the area. Sometimes, it can happen after surgery in the area.  Meralgia paresthetica is more common in people who have diabetes or obesity, and in older people. It is not a serious condition, and it usually goes away on its own.  What are the symptoms of meralgia paresthetica? — The main symptoms involve the upper, outer thigh. They can include:  ?Pain – Pain can be burning or stinging.  ?Tingling – This can feel like "pins and needles" in the area.  ?Numbness  ?Feeling extra sensitive to touch – Even light touch, like the feelings of clothing on the skin, might be unpleasant.  ?Itching  Symptoms usually affect only 1 of the thighs.  Will I need tests? — Your doctor should be able to tell if you have meralgia paresthetica by learning about your symptoms and doing a "neurological exam." In a neurological exam, the doctor checks how the brain, nerves, and muscles are working.  Sometimes doctors do other tests to make sure something else is not causing your symptoms. This is more likely if you have any symptoms that are different from the ones listed above. Other tests might include:  ?MRI of the spine – An MRI is a type of imaging test. It creates pictures of the inside of your body.  ?Nerve conduction studies ("NCS") or electromyography ("EMG") – These tests check how well your nerves and muscles are working.  How is meralgia paresthetica treated? — Meralgia paresthetica usually goes away on its own within a few weeks or months. If your symptoms bother you, it might help to:  ?Avoid wearing tight belts or clothing with a tight waistband. These things can  put pressure on the nerve that runs from your lower spine to your thigh.  ?Consider losing weight if you are overweight. Your doctor or nurse can talk to you about healthy ways to lose weight if this is something you want to do.  ?Take pain-relieving medicines such as acetaminophen (brand name: Tylenol) or ibuprofen (sample brand names: Advil, Motrin).  If your symptoms last for longer than 1 or 2 months, tell your doctor or nurse. They might suggest trying other treatments, such as pain medicines or a shot of medicine to numb the area. Sometimes surgery is recommended for people with severe symptoms, but this is rare.

## 2018-11-21 NOTE — ED Provider Notes (Signed)
[facility] EMERGENCY DEPARTMENT Provider Note   CSN: [id number] Arrival date & time: 11/21/18  1919     History   Chief Complaint Chief Complaint  Patient presents with  . Leg Pain    HPI [name] [name] is a 33 y.o. female.     The history is provided by the patient. No language interpreter was used.     33 year old female presenting complaining of left groin pain.  Patient developed sudden onset of pain to left upper thigh and groin area yesterday while she was at work.  Pain is described as a sharp nonradiating sensation with spasm to her left thigh moderate to severe.  Pain worse when she moves.  She was having difficulty getting out of bed this morning requiring help.  Pain has been persistent.  No associated fever chills no recent injury, no numbness or weakness, no back pain, bowel bladder changes, dysuria or abdominal pain.  She has been using heating pad without adequate relief.  Her last menstruation is 10/09/2018.    Past Medical History:  Diagnosis Date  . Admission for sterilization 06/12/2013  . Asthma   . Breast abscess   . Depression    HX of - not on med n, doing well.  . Eczema   . Family history of anesthesia complication    Grandmother has difficulty waskin up.  . Pneumonia    HX only -years ago has had a few times.  . Pregnant state, incidental   . S/P tubal ligation 06/13/2013  . SVD (spontaneous vaginal delivery)    x 2    Patient Active Problem List   Diagnosis Date Noted  . Tobacco use disorder 12/07/2014  . Major depressive disorder, recurrent episode, moderate (HCC) 01/13/2014  . GAD (generalized anxiety disorder) 01/13/2014  . Tinea pedis of both feet 09/10/2013  . S/P tubal ligation 06/13/2013  . Admission for sterilization 06/12/2013  . Rash and nonspecific skin eruption 06/09/2013  . Neck discomfort 02/26/2013  . Tachycardia 10/23/2012  . Allergic conjunctivitis of both eyes and rhinitis 10/14/2012  .  Mastitis, Left. 11/27/2011  . Depression 03/24/2011  . Asthma 03/01/2006  . Eczema 03/01/2006    Past Surgical History:  Procedure Laterality Date  . INCISE AND DRAIN ABCESS     left breast x 2  . INCISION AND DRAINAGE ABSCESS Left 02/17/2012   Procedure:  DRAINAGEof recurrent breast abscess;  Surgeon: Shelly Rubenstein, MD;  Location: [facility] OR;  Service: General;  Laterality: Left;  . LAPAROSCOPIC TUBAL LIGATION Bilateral 06/13/2013   Procedure: LAPAROSCOPIC TUBAL LIGATION;  Surgeon: Sherian Rein, MD;  Location: [facility];  Service: Gynecology;  Laterality: Bilateral;     OB History    Gravida  2   Para  2   Term  1   Preterm  1   AB      Living  2     SAB      TAB      Ectopic      Multiple      Live Births  2            Home Medications    Prior to Admission medications   Medication Sig Start Date End Date Taking? Authorizing Provider  famotidine (PEPCID) 20 MG tablet Take 1 tablet (20 mg total) by mouth 2 (two) times daily. 01/03/18   Fawze, Mina A, PA-C  predniSONE (DELTASONE) 20 MG tablet Take 3 tablets (60 mg total) by mouth daily.  3 tabs po day one, then 2 tabs daily x 4 days 09/09/18   Dione BoozeGlick, David, MD  albuterol (PROVENTIL HFA;VENTOLIN HFA) 108 (90 Base) MCG/ACT inhaler Inhale 2 puffs into the lungs every 4 (four) hours as needed for wheezing or shortness of breath. Can NOT refill until has an office visit. Patient not taking: Reported on 12/28/2017 07/20/17 09/09/18  Carney Livinghambliss, Marshall L, MD    Family History Family History  Problem Relation Age of Onset  . Healthy Mother   . Anxiety disorder Mother   . Healthy Father   . Stroke Maternal Grandmother   . Stroke Paternal Grandfather   . Bipolar disorder Neg Hx   . Depression Neg Hx     Social History Social History   Tobacco Use  . Smoking status: Current Every Day Smoker    Packs/day: 1.00    Years: 5.00    Pack years: 5.00    Types: Cigarettes  . Smokeless tobacco: Never Used   Substance Use Topics  . Alcohol use: Yes    Comment: occasional  . Drug use: No     Allergies   Doxycycline   Review of Systems Review of Systems  Constitutional: Negative for fever.  Musculoskeletal: Positive for arthralgias.  Neurological: Negative for numbness.     Physical Exam Updated Vital Signs BP (!) 132/94 (BP Location: Left Arm)   Pulse (!) 106   Temp 98.4 F (36.9 C) (Oral)   Resp 18   LMP 10/09/2018 (Approximate)   SpO2 98%   Physical Exam Vitals signs and nursing note reviewed.  Constitutional:      General: She is not in acute distress.    Appearance: She is well-developed. She is obese.  HENT:     Head: Atraumatic.  Eyes:     Conjunctiva/sclera: Conjunctivae normal.  Neck:     Musculoskeletal: Neck supple.  Abdominal:     General: Abdomen is flat.     Palpations: Abdomen is soft.     Tenderness: There is no abdominal tenderness.  Musculoskeletal:        General: Tenderness (Left thigh: Tenderness noted to anterior proximal thigh on palpation without any overlying skin changes.  Left inguinal without lymphadenopathy or inguinal hernia noted.  Normal hip flexion extension abduction abduction.  Leg compartment is soft) present.     Comments: No midline spine tenderness  Skin:    Findings: No rash.  Neurological:     Mental Status: She is alert and oriented to person, place, and time.      ED Treatments / Results  Labs (all labs ordered are listed, but only abnormal results are displayed) Labs Reviewed  POC URINE PREG, ED    EKG None  Radiology No results found.  Procedures Procedures (including critical care time)  Medications Ordered in ED Medications - No data to display   Initial Impression / Assessment and Plan / ED Course  I have reviewed the triage vital signs and the nursing notes.  Pertinent labs & imaging results that were available during my care of the patient were reviewed by me and considered in my medical  decision making (see chart for details).        BP (!) 132/94 (BP Location: Left Arm)   Pulse (!) 106   Temp 98.4 F (36.9 C) (Oral)   Resp 18   LMP 10/09/2018 (Approximate)   SpO2 98%    Final Clinical Impressions(s) / ED Diagnoses   Final diagnoses:  Acute pain of left  thigh    ED Discharge Orders    None     Patient here with atraumatic left groin pain that started since yesterday.  Pain is presents to the left anterior proximal thigh following the distributions of the left lateral cutaneous femoral nerve suggestive of meralgia paresthetica.  Low suspicion for infectious etiology.  No recent injury to suggest hip fracture or dislocation.  No evidence to suggest DVT.  She is neurovascular intact.  She has a soft and nontender abdomen, doubt GU pathology.  No lumbar spine tenderness to suggest sciatica.  Will provide symptomatic treatment.  Return precaution discussed.   Domenic Moras, PA-C 11/21/18 2051    Blanchie Dessert, MD 11/21/18 [phone]

## 2018-11-21 NOTE — ED Triage Notes (Signed)
Patient reports left anterior groin pain /left lower hip pain onset yesterday unrelieved by OTC Ibuprofen  , denies injury or fall , ambulatory .

## 2019-01-14 ENCOUNTER — Inpatient Hospital Stay
Admission: RE | Admit: 2019-01-14 | Discharge: 2019-01-14 | Disposition: A | Discharge status: Home / Self Care | Payer: Medicaid Other | Source: Ambulatory Visit

## 2019-04-21 ENCOUNTER — Encounter (HOSPITAL_COMMUNITY): Payer: Self-pay | Admitting: Emergency Medicine

## 2019-04-21 ENCOUNTER — Emergency Department (HOSPITAL_COMMUNITY)
Admission: EM | Admit: 2019-04-21 | Discharge: 2019-04-21 | Disposition: A | Discharge status: Home / Self Care | Payer: Medicaid Other | Attending: Emergency Medicine | Admitting: Emergency Medicine

## 2019-04-21 DIAGNOSIS — J45909 Unspecified asthma, uncomplicated: Secondary | ICD-10-CM | POA: Insufficient documentation

## 2019-04-21 DIAGNOSIS — L02811 Cutaneous abscess of head [any part, except face]: Secondary | ICD-10-CM | POA: Insufficient documentation

## 2019-04-21 DIAGNOSIS — F17210 Nicotine dependence, cigarettes, uncomplicated: Secondary | ICD-10-CM | POA: Insufficient documentation

## 2019-04-21 MED ORDER — SULFAMETHOXAZOLE-TRIMETHOPRIM 800-160 MG PO TABS: 1.0000 | ORAL_TABLET | Freq: Two times a day (BID) | ORAL | 0 refills | Status: AC

## 2019-04-21 MED ORDER — ACETAMINOPHEN 500 MG PO TABS
1000.0000 mg | ORAL_TABLET | Freq: Once | ORAL | Status: AC
  Administered 2019-04-21: 11:00:00 1000 mg via ORAL
  Filled 2019-04-21: qty 2

## 2019-04-21 NOTE — ED Notes (Signed)
Dr Zavitz at bedside  

## 2019-04-21 NOTE — ED Provider Notes (Signed)
[facility] EMERGENCY DEPARTMENT Provider Note   CSN: [id number] Arrival date & time: 04/21/19  2725     History Chief Complaint  Patient presents with  . Abscess    [name] [name] is a 33 y.o. female.  Patient with boil and asthma history presents with pain and concern for infection behind left ear.  Patient has been wearing a mask, no direct trauma.  No fever chills or vomiting.  Gradually worsening over the weekend.        Past Medical History:  Diagnosis Date  . Admission for sterilization 06/12/2013  . Asthma   . Breast abscess   . Depression    HX of - not on med n, doing well.  . Eczema   . Family history of anesthesia complication    Grandmother has difficulty waskin up.  . Pneumonia    HX only -years ago has had a few times.  . Pregnant state, incidental   . S/P tubal ligation 06/13/2013  . SVD (spontaneous vaginal delivery)    x 2    Patient Active Problem List   Diagnosis Date Noted  . Tobacco use disorder 12/07/2014  . Major depressive disorder, recurrent episode, moderate (HCC) 01/13/2014  . GAD (generalized anxiety disorder) 01/13/2014  . Tinea pedis of both feet 09/10/2013  . S/P tubal ligation 06/13/2013  . Admission for sterilization 06/12/2013  . Rash and nonspecific skin eruption 06/09/2013  . Neck discomfort 02/26/2013  . Tachycardia 10/23/2012  . Allergic conjunctivitis of both eyes and rhinitis 10/14/2012  . Mastitis, Left. 11/27/2011  . Depression 03/24/2011  . Asthma 03/01/2006  . Eczema 03/01/2006    Past Surgical History:  Procedure Laterality Date  . INCISE AND DRAIN ABCESS     left breast x 2  . INCISION AND DRAINAGE ABSCESS Left 02/17/2012   Procedure:  DRAINAGEof recurrent breast abscess;  Surgeon: Shelly Rubenstein, MD;  Location: [facility] OR;  Service: General;  Laterality: Left;  . LAPAROSCOPIC TUBAL LIGATION Bilateral 06/13/2013   Procedure: LAPAROSCOPIC TUBAL LIGATION;  Surgeon: Sherian Rein,  MD;  Location: [facility];  Service: Gynecology;  Laterality: Bilateral;     OB History    Gravida  2   Para  2   Term  1   Preterm  1   AB      Living  2     SAB      TAB      Ectopic      Multiple      Live Births  2           Family History  Problem Relation Age of Onset  . Healthy Mother   . Anxiety disorder Mother   . Healthy Father   . Stroke Maternal Grandmother   . Stroke Paternal Grandfather   . Bipolar disorder Neg Hx   . Depression Neg Hx     Social History   Tobacco Use  . Smoking status: Current Every Day Smoker    Packs/day: 1.00    Years: 5.00    Pack years: 5.00    Types: Cigarettes  . Smokeless tobacco: Never Used  Substance Use Topics  . Alcohol use: Yes    Comment: occasional  . Drug use: No    Home Medications Prior to Admission medications   Medication Sig Start Date End Date Taking? Authorizing Provider  cyclobenzaprine (FLEXERIL) 10 MG tablet Take 1 tablet (10 mg total) by mouth 2 (two) times daily as needed for muscle  spasms. 11/21/18   Fayrene Helper, PA-C  famotidine (PEPCID) 20 MG tablet Take 1 tablet (20 mg total) by mouth 2 (two) times daily. 01/03/18   Fawze, Mina A, PA-C  gabapentin (NEURONTIN) 100 MG capsule Take 1 capsule (100 mg total) by mouth 3 (three) times daily. 11/21/18   Fayrene Helper, PA-C  ibuprofen (ADVIL) 600 MG tablet Take 1 tablet (600 mg total) by mouth every 6 (six) hours as needed. 11/21/18   Fayrene Helper, PA-C  predniSONE (DELTASONE) 20 MG tablet Take 3 tablets (60 mg total) by mouth daily. 3 tabs po day one, then 2 tabs daily x 4 days 09/09/18   Dione Booze, MD  sulfamethoxazole-trimethoprim (BACTRIM DS) 800-160 MG tablet Take 1 tablet by mouth 2 (two) times daily for 7 days. 04/21/19 04/28/19  Blane Ohara, MD  albuterol (PROVENTIL HFA;VENTOLIN HFA) 108 (90 Base) MCG/ACT inhaler Inhale 2 puffs into the lungs every 4 (four) hours as needed for wheezing or shortness of breath. Can NOT refill until has an office  visit. Patient not taking: Reported on 12/28/2017 07/20/17 09/09/18  Carney Living, MD    Allergies    Doxycycline  Review of Systems   Review of Systems  Constitutional: Negative for chills and fever.  HENT: Negative for congestion.   Eyes: Negative for visual disturbance.  Respiratory: Negative for shortness of breath.   Cardiovascular: Negative for chest pain.  Gastrointestinal: Negative for abdominal pain and vomiting.  Genitourinary: Negative for dysuria and flank pain.  Musculoskeletal: Negative for back pain, neck pain and neck stiffness.  Skin: Positive for rash and wound.  Neurological: Negative for light-headedness and headaches.    Physical Exam Updated Vital Signs BP (!) 131/101 (BP Location: Left Arm)   Pulse (!) 118   Temp 98.7 F (37.1 C) (Oral)   Resp 16   Ht 5' (1.524 m)   Wt 72.6 kg   SpO2 99%   BMI 31.25 kg/m   Physical Exam Vitals and nursing note reviewed.  Constitutional:      Appearance: She is well-developed.  HENT:     Head: Normocephalic.     Comments: Patient has 2 cm fluctuant/abscess posterior to left ear, surrounding erythema approximately 3-1/2 cm.  No crepitance.  Neck supple no meningismus. Eyes:     General:        Right eye: No discharge.        Left eye: No discharge.     Conjunctiva/sclera: Conjunctivae normal.  Neck:     Trachea: No tracheal deviation.  Cardiovascular:     Rate and Rhythm: Tachycardia present.  Pulmonary:     Effort: Pulmonary effort is normal.  Abdominal:     General: There is no distension.  Musculoskeletal:     Cervical back: Normal range of motion and neck supple.  Skin:    General: Skin is warm.     Findings: Rash present.  Neurological:     Mental Status: She is alert and oriented to person, place, and time.     ED Results / Procedures / Treatments   Labs (all labs ordered are listed, but only abnormal results are displayed) Labs Reviewed - No data to  display  EKG None  Radiology No results found.  Procedures .Marland KitchenIncision and Drainage  Date/Time: 04/21/2019 10:43 AM Performed by: Blane Ohara, MD Authorized by: Blane Ohara, MD   Consent:    Consent obtained:  Verbal   Consent given by:  Patient   Risks discussed:  Bleeding, incomplete drainage,  infection, damage to other organs and pain   Alternatives discussed:  No treatment Location:    Type:  Abscess   Size:  2 cm   Location:  Head   Head location:  Scalp Pre-procedure details:    Skin preparation:  Betadine Anesthesia (see MAR for exact dosages):    Anesthesia method:  Local infiltration   Local anesthetic:  Lidocaine 1% WITH epi Procedure type:    Complexity:  Simple Procedure details:    Needle aspiration: yes     Needle size:  25 G   Incision types:  Stab incision   Incision depth:  Dermal   Scalpel blade:  11   Wound management:  Probed and deloculated   Drainage:  Purulent   Drainage amount:  Moderate   Wound treatment:  Wound left open   Packing materials:  None Post-procedure details:    Patient tolerance of procedure:  Tolerated well, no immediate complications   (including critical care time)  Medications Ordered in ED Medications  acetaminophen (TYLENOL) tablet 1,000 mg (1,000 mg Oral Given 04/21/19 1038)    ED Course  I have reviewed the triage vital signs and the nursing notes.  Pertinent labs & imaging results that were available during my care of the patient were reviewed by me and considered in my medical decision making (see chart for details).    MDM Rules/Calculators/A&P                     Patient presents with clinical concern for abscess likely MRSA given patient's history of boil.  Possibly related to wearing a mask with the abrasion from posterior area. Tylenol for pain.   Incision and drainage performed, discussed wound care and antibiotics with outpatient follow-up.  Patient does have tachycardia in ER clinically  secondary to pain.   Final Clinical Impression(s) / ED Diagnoses Final diagnoses:  Scalp abscess    Rx / DC Orders ED Discharge Orders         Ordered    sulfamethoxazole-trimethoprim (BACTRIM DS) 800-160 MG tablet  2 times daily     04/21/19 1035           Elnora Morrison, MD 04/21/19 1044

## 2019-04-21 NOTE — ED Triage Notes (Signed)
Pt has an abscess behind left ear that began swelling on Friday.

## 2019-04-21 NOTE — Discharge Instructions (Signed)
Soak regularly and keep clean as directed. Take antibiotics as discussed. Follow-up with a clinician if you develop vomiting, fevers, spreading redness or new concerns. Use Tylenol or Motrin every 6 hours as needed for pain.

## 2019-06-26 ENCOUNTER — Emergency Department (HOSPITAL_COMMUNITY)
Admission: EM | Admit: 2019-06-26 | Discharge: 2019-06-26 | Disposition: A | Discharge status: Home / Self Care | Payer: Medicaid Other | Attending: Emergency Medicine | Admitting: Emergency Medicine

## 2019-06-26 ENCOUNTER — Encounter (HOSPITAL_COMMUNITY): Payer: Self-pay | Admitting: *Deleted

## 2019-06-26 ENCOUNTER — Other Ambulatory Visit: Payer: Self-pay

## 2019-06-26 DIAGNOSIS — R21 Rash and other nonspecific skin eruption: Secondary | ICD-10-CM

## 2019-06-26 DIAGNOSIS — L299 Pruritus, unspecified: Secondary | ICD-10-CM | POA: Insufficient documentation

## 2019-06-26 DIAGNOSIS — F17210 Nicotine dependence, cigarettes, uncomplicated: Secondary | ICD-10-CM | POA: Insufficient documentation

## 2019-06-26 MED ORDER — BETAMETHASONE DIPROPIONATE 0.05 % EX OINT: TOPICAL_OINTMENT | Freq: Two times a day (BID) | CUTANEOUS | 0 refills | Status: DC

## 2019-06-26 MED ORDER — PREDNISONE 10 MG (21) PO TBPK: ORAL_TABLET | Freq: Every day | ORAL | 0 refills | Status: DC

## 2019-06-26 MED ORDER — FAMOTIDINE 20 MG PO TABS: 40.0000 mg | ORAL_TABLET | Freq: Once | ORAL | Status: DC

## 2019-06-26 MED ORDER — PREDNISONE 20 MG PO TABS
60.0000 mg | ORAL_TABLET | Freq: Once | ORAL | Status: AC
  Administered 2019-06-26: 60 mg via ORAL
  Filled 2019-06-26: qty 3

## 2019-06-26 MED ORDER — HYDROXYZINE HCL 25 MG PO TABS: 25.0000 mg | ORAL_TABLET | Freq: Three times a day (TID) | ORAL | 0 refills | Status: DC

## 2019-06-26 MED ORDER — HYDROXYZINE HCL 25 MG PO TABS
25.0000 mg | ORAL_TABLET | Freq: Once | ORAL | Status: AC
  Administered 2019-06-26: 25 mg via ORAL
  Filled 2019-06-26: qty 1

## 2019-06-26 NOTE — ED Triage Notes (Signed)
Pt reports hx of eczema and had rash to her neck. Woke up this am and feels like it has gotten worse and reports drainage. No acute distress noted at triage.

## 2019-06-26 NOTE — ED Provider Notes (Signed)
[facility] EMERGENCY DEPARTMENT Provider Note   CSN: [id number] Arrival date & time: 06/26/19  1216     History Chief Complaint  Patient presents with  . Rash    [name] [name] is a 34 y.o. female past medical history significant for asthma and eczema.  HPI Patient presents to emergency department today with chief complaint of progressively worsening rash x2 weeks on her neck and bilateral arms. She endorses associated pruritus.  She has been taking Benadryl without any relief.  She has a history of eczema and states this feels similar however it is worse.  She is no longer established with a pcp. Denies fever, chills, contacts with persons with similar rash, or any changes in lotions/soaps/detergents, exposure to animal or plant irritants, and denies swelling or purulent discharge. No new medications. No recent travel. No recent tick bites. No involvement to palms/soles or between webspaces. Patient does not have history of  immunocompromised . UTD on immunizations.       Past Medical History:  Diagnosis Date  . Admission for sterilization 06/12/2013  . Asthma   . Breast abscess   . Depression    HX of - not on med n, doing well.  . Eczema   . Family history of anesthesia complication    Grandmother has difficulty waskin up.  . Pneumonia    HX only -years ago has had a few times.  . Pregnant state, incidental   . S/P tubal ligation 06/13/2013  . SVD (spontaneous vaginal delivery)    x 2    Patient Active Problem List   Diagnosis Date Noted  . Tobacco use disorder 12/07/2014  . Major depressive disorder, recurrent episode, moderate (HCC) 01/13/2014  . GAD (generalized anxiety disorder) 01/13/2014  . Tinea pedis of both feet 09/10/2013  . S/P tubal ligation 06/13/2013  . Admission for sterilization 06/12/2013  . Rash and nonspecific skin eruption 06/09/2013  . Neck discomfort 02/26/2013  . Tachycardia 10/23/2012  . Allergic conjunctivitis of  both eyes and rhinitis 10/14/2012  . Mastitis, Left. 11/27/2011  . Depression 03/24/2011  . Asthma 03/01/2006  . Eczema 03/01/2006    Past Surgical History:  Procedure Laterality Date  . INCISE AND DRAIN ABCESS     left breast x 2  . INCISION AND DRAINAGE ABSCESS Left 02/17/2012   Procedure:  DRAINAGEof recurrent breast abscess;  Surgeon: Shelly Rubenstein, MD;  Location: [facility] OR;  Service: General;  Laterality: Left;  . LAPAROSCOPIC TUBAL LIGATION Bilateral 06/13/2013   Procedure: LAPAROSCOPIC TUBAL LIGATION;  Surgeon: Sherian Rein, MD;  Location: [facility];  Service: Gynecology;  Laterality: Bilateral;     OB History    Gravida  2   Para  2   Term  1   Preterm  1   AB      Living  2     SAB      TAB      Ectopic      Multiple      Live Births  2           Family History  Problem Relation Age of Onset  . Healthy Mother   . Anxiety disorder Mother   . Healthy Father   . Stroke Maternal Grandmother   . Stroke Paternal Grandfather   . Bipolar disorder Neg Hx   . Depression Neg Hx     Social History   Tobacco Use  . Smoking status: Current Every Day Smoker    Packs/day:  1.00    Years: 5.00    Pack years: 5.00    Types: Cigarettes  . Smokeless tobacco: Never Used  Substance Use Topics  . Alcohol use: Yes    Comment: occasional  . Drug use: No    Home Medications Prior to Admission medications   Medication Sig Start Date End Date Taking? Authorizing Provider  acetaminophen (TYLENOL) 500 MG tablet Take 500-1,000 mg by mouth every 6 (six) hours as needed for mild pain or headache.   Yes [provider]  albuterol (PROAIR HFA) 108 (90 Base) MCG/ACT inhaler Inhale 2 puffs into the lungs every 6 (six) hours as needed for wheezing or shortness of breath.   Yes [provider]  ibuprofen (ADVIL) 200 MG tablet Take 800 mg by mouth every 6 (six) hours as needed for headache or mild pain.   Yes [provider]  betamethasone  dipropionate (DIPROLENE) 0.05 % ointment Apply topically 2 (two) times daily. 06/26/19   Yoshua Geisinger E, PA-C  hydrOXYzine (ATARAX/VISTARIL) 25 MG tablet Take 1 tablet (25 mg total) by mouth 3 (three) times daily. For itching 06/26/19   Alaysha Jefcoat, Yvonna Alanis E, PA-C  predniSONE (STERAPRED UNI-PAK 21 TAB) 10 MG (21) TBPK tablet Take by mouth daily. Take 6 tabs by mouth daily  for 2 days, then 5 tabs for 2 days, then 4 tabs for 2 days, then 3 tabs for 2 days, 2 tabs for 2 days, then 1 tab by mouth daily for 2 days 06/26/19   Cherolyn Behrle, Yvonna Alanis E, PA-C  famotidine (PEPCID) 20 MG tablet Take 1 tablet (20 mg total) by mouth 2 (two) times daily. 01/03/18 06/26/19  Michela Pitcher A, PA-C  gabapentin (NEURONTIN) 100 MG capsule Take 1 capsule (100 mg total) by mouth 3 (three) times daily. 11/21/18 06/26/19  Fayrene Helper, PA-C    Allergies    Doxycycline  Review of Systems   Review of Systems All other systems are reviewed and are negative for acute change except as noted in the HPI.  Physical Exam Updated Vital Signs BP 125/73 (BP Location: Left Arm)   Pulse 82   Temp 98.2 F (36.8 C) (Oral)   Resp 20   LMP 06/21/2019   SpO2 100%   Physical Exam Vitals and nursing note reviewed.  Constitutional:      Appearance: She is well-developed. She is not ill-appearing or toxic-appearing.     Comments: Airway is intact. No angioedema, no oral lesions.  HENT:     Head: Normocephalic and atraumatic.     Nose: Nose normal.  Eyes:     General: No scleral icterus.       Right eye: No discharge.        Left eye: No discharge.     Conjunctiva/sclera: Conjunctivae normal.  Neck:     Vascular: No JVD.  Cardiovascular:     Rate and Rhythm: Normal rate and regular rhythm.     Pulses: Normal pulses.     Heart sounds: Normal heart sounds.  Pulmonary:     Effort: Pulmonary effort is normal.     Breath sounds: Normal breath sounds.  Abdominal:     General: There is no distension.  Musculoskeletal:         General: Normal range of motion.     Cervical back: Normal range of motion.  Skin:    General: Skin is warm and dry.     Findings: Rash present.     Comments: Diffuse erythematous papular rash with excoriations  on bilateral biceps and neck.  There are no pustules, vesicles, bulla or drainage.  No lesions noted on palms or soles.  Neurological:     Mental Status: She is oriented to person, place, and time.     GCS: GCS eye subscore is 4. GCS verbal subscore is 5. GCS motor subscore is 6.     Comments: Fluent speech, no facial droop.  Psychiatric:        Behavior: Behavior normal.     ED Results / Procedures / Treatments   Labs (all labs ordered are listed, but only abnormal results are displayed) Labs Reviewed - No data to display  EKG None  Radiology No results found.  Procedures Procedures (including critical care time)  Medications Ordered in ED Medications  predniSONE (DELTASONE) tablet 60 mg (60 mg Oral Given 06/26/19 1704)  hydrOXYzine (ATARAX/VISTARIL) tablet 25 mg (25 mg Oral Given 06/26/19 1705)    ED Course  I have reviewed the triage vital signs and the nursing notes.  Pertinent labs & imaging results that were available during my care of the patient were reviewed by me and considered in my medical decision making (see chart for details).    MDM Rules/Calculators/A&P                          History provided by patient with additional history obtained from chart review.    Rash consistent with eczema. Patient denies any difficulty breathing or swallowing.  Pt has a patent airway without stridor and is handling secretions without difficulty; no angioedema. No blisters, no pustules, no warmth, no draining sinus tracts, no superficial abscesses, no bullous impetigo, no vesicles, no desquamation, no target lesions with dusky purpura or a central bulla. Not tender to touch. No concern for superimposed infection. No concern for SJS, TEN, TSS, tick borne illness,  syphilis or other life-threatening condition. Will discharge home with short course of steroid ointment, steroid taper and atarax as needed for pruritis. Patient given information for community clinic as she does not have pcp currently.   Portions of this note were generated with [employer]. Dictation errors may occur despite best attempts at proofreading.    Final Clinical Impression(s) / ED Diagnoses Final diagnoses:  Rash    Rx / DC Orders ED Discharge Orders         Ordered    betamethasone dipropionate (DIPROLENE) 0.05 % ointment  2 times daily     Discontinue  Reprint     06/26/19 1649    predniSONE (STERAPRED UNI-PAK 21 TAB) 10 MG (21) TBPK tablet  Daily     Discontinue  Reprint     06/26/19 1649    hydrOXYzine (ATARAX/VISTARIL) 25 MG tablet  3 times daily     Discontinue  Reprint     06/26/19 1649           Zakia Sainato, Harley Hallmark, PA-C 06/26/19 1818    Lacretia Leigh, MD 07/01/19 1052

## 2019-06-26 NOTE — ED Notes (Signed)
Pt verbalized understanding of d/c instructions, medications and follow up. Pt ambulatory to WR, NAD

## 2019-06-26 NOTE — Discharge Instructions (Addendum)
You have been seen today for rash. Please read and follow all provided instructions. Return to the emergency room for worsening condition or new concerning symptoms.    1. Medications:  Prescription to your pharmacy for Atarax.  This is a medicine used to help with itching.  Take this instead of Benadryl.  -Prescription also sent for prednisone taper.  Take this as prescribed.  -Prescription sent for an ointment that you can apply to your arms and neck for pain.  Do not apply to your face or groin.  Continue usual home medications Take medications as prescribed. Please review all of the medicines and only take them if you do not have an allergy to them.   2. Treatment: rest, drink plenty of fluids  3. Follow Up:  Please follow up with primary care provider by scheduling an appointment as soon as possible for a visit  If you do not have a primary care physician, contact [facility] at [phone] for referral -I have also given you the contact information for [facility] any health and wellness clinic.  You can call the clinic to schedule the next available appointment.  It is also a possibility that you have an allergic reaction to any of the medicines that you have been prescribed - Everybody reacts differently to medications and while MOST people have no trouble with most medicines, you may have a reaction such as nausea, vomiting, rash, swelling, shortness of breath. If this is the case, please stop taking the medicine immediately and contact your physician.  ?

## 2019-07-16 NOTE — Progress Notes (Signed)
Patient ID: [name] [name], female   DOB: Jun 05, 1985, 34 y.o.   MRN: [id number]  Virtual Visit via Telephone Note  I connected with [name] [name] on 07/23/19 at 10:30 AM EDT by telephone and verified that I am speaking with the correct person using two identifiers.   I discussed the limitations, risks, security and privacy concerns of performing an evaluation and management service by telephone and the availability of in person appointments. I also discussed with the patient that there may be a patient responsible charge related to this service. The patient expressed understanding and agreed to proceed.  PATIENT visit by telephone virtually in the context of Covid-19 pandemic. Patient location:  home My Location:  [facility] office Persons on the call: me and the patient  History of Present Illness: After being seen in the ED 6/24 for rash.  Rash has improved with cream.  Needs RF on albuterol bc was unable to afford at her pharmacy.  No other issues or concerns.     From A/P: Rash consistent with eczema. Patient denies any difficulty breathing or swallowing.  Pt has a patent airway without stridor and is handling secretions without difficulty; no angioedema. No blisters, no pustules, no warmth, no draining sinus tracts, no superficial abscesses, no bullous impetigo, no vesicles, no desquamation, no target lesions with dusky purpura or a central bulla. Not tender to touch. No concern for superimposed infection. No concern for SJS, TEN, TSS, tick borne illness, syphilis or other life-threatening condition. Will discharge home with short course of steroid ointment, steroid taper and atarax as needed for pruritis. Patient given information for community clinic as she does not have pcp currently.    Observations/Objective:  NAD.  A&Ox3   Assessment and Plan: 1. Moderate persistent asthma without complication - albuterol (PROAIR HFA) 108 (90 Base) MCG/ACT inhaler; Inhale 2 puffs into the  lungs every 6 (six) hours as needed for wheezing or shortness of breath.  Dispense: 18 g; Refill: 1  2. Eczema, unspecified type - triamcinolone cream (KENALOG) 0.1 %; Apply 1 application topically 2 (two) times daily.  Dispense: 30 g; Refill: 2  3. Encounter for examination following treatment at hospital    Follow Up Instructions: Assign PCP in about 2 months   I discussed the assessment and treatment plan with the patient. The patient was provided an opportunity to ask questions and all were answered. The patient agreed with the plan and demonstrated an understanding of the instructions.   The patient was advised to call back or seek an in-person evaluation if the symptoms worsen or if the condition fails to improve as anticipated.  I provided 11 minutes of non-face-to-face time during this encounter.   Georgian Co, PA-C

## 2019-07-23 ENCOUNTER — Other Ambulatory Visit: Payer: Self-pay

## 2019-07-23 ENCOUNTER — Encounter: Payer: Self-pay | Admitting: Physician Assistant

## 2019-07-23 ENCOUNTER — Ambulatory Visit: Payer: Self-pay | Attending: Physician Assistant | Admitting: Physician Assistant

## 2019-07-23 DIAGNOSIS — J4540 Moderate persistent asthma, uncomplicated: Secondary | ICD-10-CM

## 2019-07-23 DIAGNOSIS — Z09 Encounter for follow-up examination after completed treatment for conditions other than malignant neoplasm: Secondary | ICD-10-CM

## 2019-07-23 DIAGNOSIS — L309 Dermatitis, unspecified: Secondary | ICD-10-CM

## 2019-07-23 MED ORDER — ALBUTEROL SULFATE HFA 108 (90 BASE) MCG/ACT IN AERS: 2.0000 | INHALATION_SPRAY | Freq: Four times a day (QID) | RESPIRATORY_TRACT | 1 refills | Status: DC | PRN

## 2019-07-23 MED ORDER — TRIAMCINOLONE ACETONIDE 0.1 % EX CREA: 1.0000 "application " | TOPICAL_CREAM | Freq: Two times a day (BID) | CUTANEOUS | 2 refills | Status: DC

## 2019-07-23 MED FILL — !VENTOLIN HFA INHALER: 108 (90 BAS | 25 days supply | Qty: 18 | Fill #0

## 2019-07-23 MED FILL — TRIAMCINOLONE ACETONIDE 0.1: 0.1 | 15 days supply | Qty: 30 | Fill #0

## 2019-07-23 NOTE — Progress Notes (Signed)
f /u ED

## 2019-10-27 ENCOUNTER — Telehealth: Payer: Self-pay

## 2019-10-27 NOTE — Telephone Encounter (Signed)
Copied from CRM [id number]. Topic: General - Other >> Oct 22, 2019 11:27 AM Gwenlyn Fudge wrote: Reason for CRM: Pt called stating that she got her paperwork filled out for financial assistance. Pt is requesting to have next steps and what she should do. Please advise.

## 2019-10-28 ENCOUNTER — Telehealth: Payer: Self-pay | Admitting: General Practice

## 2019-10-28 NOTE — Telephone Encounter (Signed)
I return Pt call, when over what she need to apply for [employer] financial program

## 2019-11-04 ENCOUNTER — Ambulatory Visit: Payer: Self-pay | Attending: Family Medicine

## 2019-11-04 ENCOUNTER — Other Ambulatory Visit: Payer: Self-pay

## 2019-11-12 ENCOUNTER — Ambulatory Visit: Payer: Medicaid Other | Attending: Family Medicine | Admitting: Family Medicine

## 2019-11-12 ENCOUNTER — Encounter: Payer: Self-pay | Admitting: Family Medicine

## 2019-11-12 ENCOUNTER — Other Ambulatory Visit: Payer: Self-pay

## 2019-11-12 ENCOUNTER — Other Ambulatory Visit: Payer: Self-pay | Admitting: Family Medicine

## 2019-11-12 VITALS — BP 144/86 | HR 103 | Wt 182.2 lb

## 2019-11-12 DIAGNOSIS — F411 Generalized anxiety disorder: Secondary | ICD-10-CM

## 2019-11-12 DIAGNOSIS — L309 Dermatitis, unspecified: Secondary | ICD-10-CM

## 2019-11-12 DIAGNOSIS — J4540 Moderate persistent asthma, uncomplicated: Secondary | ICD-10-CM

## 2019-11-12 DIAGNOSIS — L2089 Other atopic dermatitis: Secondary | ICD-10-CM

## 2019-11-12 MED ORDER — ALBUTEROL SULFATE HFA 108 (90 BASE) MCG/ACT IN AERS: 2.0000 | INHALATION_SPRAY | Freq: Four times a day (QID) | RESPIRATORY_TRACT | 1 refills | Status: DC | PRN

## 2019-11-12 MED ORDER — CETIRIZINE HCL 10 MG PO TABS: 10.0000 mg | ORAL_TABLET | Freq: Every day | ORAL | 4 refills | Status: DC

## 2019-11-12 MED ORDER — BETAMETHASONE DIPROPIONATE 0.05 % EX OINT: TOPICAL_OINTMENT | Freq: Two times a day (BID) | CUTANEOUS | 0 refills | Status: DC

## 2019-11-12 MED ORDER — ESCITALOPRAM OXALATE 10 MG PO TABS: 10.0000 mg | ORAL_TABLET | Freq: Every day | ORAL | 1 refills | Status: DC

## 2019-11-12 MED ORDER — PREDNISONE 20 MG PO TABS: ORAL_TABLET | ORAL | 0 refills | Status: DC

## 2019-11-12 MED ORDER — HYDROXYZINE HCL 25 MG PO TABS: 25.0000 mg | ORAL_TABLET | Freq: Three times a day (TID) | ORAL | 0 refills | Status: DC

## 2019-11-12 MED FILL — ALBUTEROL SULFATE HFA 108 (: 108 (90 BAS | 25 days supply | Qty: 18 | Fill #0

## 2019-11-12 NOTE — Patient Instructions (Signed)

## 2019-11-12 NOTE — Progress Notes (Signed)
EZCEMA FLARE WANTS ANXIETY RX

## 2019-11-12 NOTE — Progress Notes (Signed)
Established Patient Office Visit  Subjective:  Patient ID: [name] [name], female    DOB: September 24, 1985  Age: 34 y.o. MRN: [id number]  CC:  Chief Complaint  Patient presents with  . Establish Care    HPI [name] [name], 34 year old female who presents to establish care.  She reports that she has had longstanding issues with eczema for which she is seeing specialist in the past without much relief of her symptoms.  She reports that she has had a recent flareup with areas of reddened, slightly thickened skin which is itchy.  She does not currently have any of her previous steroid cream.  She also request prescription to help with itching and sleep.  She also has taken prednisone tapers in the past which did seem to help calm down her skin issues and she would like to have a prescription at today's visit as well as a referral to a skin specialist.  She additionally has a history of asthma and needs a refill of albuterol.  She does have some daily issues with shortness of breath and awakening with sensation of chest tightness and occasional wheezing.  She also reports some recent increase in chronic issues with anxiety.  She reports that she has been treated with medications for anxiety in the past but cannot recall which medications.  When sertraline/Zoloft as mentioned, she believes that she did take this medication in the past but did not feel that it was really helpful.  She reports that in episodes of feeling anxious for no reason and sometimes feels as if she is having some increased heart rate as tightness of breath as if she might be having a panic attack.  She is interested in being back on medication to help with anxiety.   Past Medical History:  Diagnosis Date  . Admission for sterilization 06/12/2013  . Asthma   . Breast abscess   . Depression    HX of - not on med n, doing well.  . Eczema   . Family history of anesthesia complication    Grandmother has difficulty waskin  up.  . Pneumonia    HX only -years ago has had a few times.  . Pregnant state, incidental   . S/P tubal ligation 06/13/2013  . SVD (spontaneous vaginal delivery)    x 2    Past Surgical History:  Procedure Laterality Date  . INCISE AND DRAIN ABCESS     left breast x 2  . INCISION AND DRAINAGE ABSCESS Left 02/17/2012   Procedure:  DRAINAGEof recurrent breast abscess;  Surgeon: Shelly Rubenstein, MD;  Location: [facility] OR;  Service: General;  Laterality: Left;  . LAPAROSCOPIC TUBAL LIGATION Bilateral 06/13/2013   Procedure: LAPAROSCOPIC TUBAL LIGATION;  Surgeon: Sherian Rein, MD;  Location: [facility] ORS;  Service: Gynecology;  Laterality: Bilateral;    Family History  Problem Relation Age of Onset  . Healthy Mother   . Anxiety disorder Mother   . Healthy Father   . Stroke Maternal Grandmother   . Stroke Paternal Grandfather   . Bipolar disorder Neg Hx   . Depression Neg Hx     Social History   Socioeconomic History  . Marital status: Divorced    Spouse name: Not on file  . Number of children: Not on file  . Years of education: Not on file  . Highest education level: Not on file  Occupational History  . Not on file  Tobacco Use  . Smoking status: Current Every Day Smoker  Packs/day: 1.00    Years: 5.00    Pack years: 5.00    Types: Cigarettes  . Smokeless tobacco: Never Used  Substance and Sexual Activity  . Alcohol use: Yes    Comment: occasional  . Drug use: No  . Sexual activity: Yes    Birth control/protection: Surgical  Other Topics Concern  . Not on file  Social History Narrative   Lives with boyfriend Eligah East and his son Sheria Lang 2006   Not currently working    Social Determinants of Health   Financial Resource Strain:   . Difficulty of Paying Living Expenses: Not on file  Food Insecurity:   . Worried About Programme researcher, broadcasting/film/video in the Last Year: Not on file  . Ran Out of Food in the Last Year: Not on file  Transportation Needs:   . Lack of  Transportation (Medical): Not on file  . Lack of Transportation (Non-Medical): Not on file  Physical Activity:   . Days of Exercise per Week: Not on file  . Minutes of Exercise per Session: Not on file  Stress:   . Feeling of Stress : Not on file  Social Connections:   . Frequency of Communication with Friends and Family: Not on file  . Frequency of Social Gatherings with Friends and Family: Not on file  . Attends Religious Services: Not on file  . Active Member of Clubs or Organizations: Not on file  . Attends Banker Meetings: Not on file  . Marital Status: Not on file  Intimate Partner Violence:   . Fear of Current or Ex-Partner: Not on file  . Emotionally Abused: Not on file  . Physically Abused: Not on file  . Sexually Abused: Not on file    Outpatient Medications Prior to Visit  Medication Sig Dispense Refill  . albuterol (PROAIR HFA) 108 (90 Base) MCG/ACT inhaler Inhale 2 puffs into the lungs every 6 (six) hours as needed for wheezing or shortness of breath. 18 g 1  . triamcinolone cream (KENALOG) 0.1 % Apply 1 application topically 2 (two) times daily. 30 g 2  . acetaminophen (TYLENOL) 500 MG tablet Take 500-1,000 mg by mouth every 6 (six) hours as needed for mild pain or headache.    . betamethasone dipropionate (DIPROLENE) 0.05 % ointment Apply topically 2 (two) times daily. (Patient not taking: Reported on 11/12/2019) 30 g 0  . hydrOXYzine (ATARAX/VISTARIL) 25 MG tablet Take 1 tablet (25 mg total) by mouth 3 (three) times daily. For itching 12 tablet 0  . ibuprofen (ADVIL) 200 MG tablet Take 800 mg by mouth every 6 (six) hours as needed for headache or mild pain.    . predniSONE (STERAPRED UNI-PAK 21 TAB) 10 MG (21) TBPK tablet Take by mouth daily. Take 6 tabs by mouth daily  for 2 days, then 5 tabs for 2 days, then 4 tabs for 2 days, then 3 tabs for 2 days, 2 tabs for 2 days, then 1 tab by mouth daily for 2 days 42 tablet 0   No facility-administered  medications prior to visit.    Allergies  Allergen Reactions  . Doxycycline Photosensitivity    ROS Review of Systems  Constitutional: Positive for fatigue. Negative for chills and fever.  HENT: Positive for congestion. Negative for sore throat and trouble swallowing.   Eyes: Negative for photophobia and visual disturbance.  Respiratory: Positive for shortness of breath. Negative for cough and wheezing.   Cardiovascular: Negative for chest pain and palpitations.  Gastrointestinal: Negative for abdominal pain, constipation, diarrhea and nausea.  Endocrine: Negative for polydipsia, polyphagia and polyuria.  Genitourinary: Negative for dysuria and frequency.  Musculoskeletal: Negative for arthralgias and back pain.  Skin: Positive for rash. Negative for wound.  Neurological: Negative for dizziness and headaches.  Hematological: Negative for adenopathy. Does not bruise/bleed easily.  Psychiatric/Behavioral: Positive for sleep disturbance. The patient is nervous/anxious.       Objective:    Physical Exam Vitals and nursing note reviewed.  Constitutional:      General: She is not in acute distress.    Comments: 34 year old female with visible plaque like rash causing mild erythema/pink appearance to the skin on the bilateral forearms with concentration at the antecubital fossa  Cardiovascular:     Rate and Rhythm: Normal rate and regular rhythm.  Pulmonary:     Effort: Pulmonary effort is normal.     Breath sounds: Normal breath sounds. No wheezing.  Abdominal:     Palpations: Abdomen is soft.     Tenderness: There is no abdominal tenderness. There is no right CVA tenderness, left CVA tenderness, guarding or rebound.  Musculoskeletal:        General: No tenderness.     Cervical back: Normal range of motion and neck supple. No tenderness.     Right lower leg: No edema.     Left lower leg: No edema.  Lymphadenopathy:     Cervical: No cervical adenopathy.  Skin:    General:  Skin is warm and dry.     Findings: Rash (Plaque-like rash on bilateral forearms, neck and chest) present.  Neurological:     General: No focal deficit present.     Mental Status: She is alert and oriented to person, place, and time.  Psychiatric:        Mood and Affect: Mood normal.        Behavior: Behavior normal.     BP (!) 144/86 (BP Location: Left Arm, Patient Position: Sitting)   Pulse (!) 103   Wt 182 lb 3.2 oz (82.6 kg)   SpO2 97%   BMI 35.58 kg/m  Wt Readings from Last 3 Encounters:  11/12/19 182 lb 3.2 oz (82.6 kg)  04/21/19 160 lb (72.6 kg)  01/03/18 149 lb 14.6 oz (68 kg)     Health Maintenance Due  Topic Date Due  . Hepatitis C Screening  Never done  . COVID-19 Vaccine (1) Never done  . TETANUS/TDAP  Never done  . PAP SMEAR-Modifier  11/26/2006  . INFLUENZA VACCINE  08/03/2019      Lab Results  Component Value Date   TSH 2.296 01/26/2014   Lab Results  Component Value Date   WBC 8.5 12/28/2017   HGB 14.8 12/28/2017   HCT 44.9 12/28/2017   MCV 100.7 (H) 12/28/2017   PLT 329 12/28/2017   Lab Results  Component Value Date   NA 138 12/28/2017   K 3.8 12/28/2017   CO2 27 12/28/2017   GLUCOSE 96 12/28/2017   BUN 5 (L) 12/28/2017   CREATININE 0.76 12/28/2017   BILITOT 0.6 12/28/2017   ALKPHOS 46 12/28/2017   AST 16 12/28/2017   ALT 12 12/28/2017   PROT 6.5 12/28/2017   ALBUMIN 4.0 12/28/2017   CALCIUM 9.0 12/28/2017   ANIONGAP 7 12/28/2017   No results found for: CHOL No results found for: HDL No results found for: LDLCALC No results found for: TRIG No results found for: CHOLHDL No results found for: [id number]  Assessment & Plan:  1. Eczema, unspecified type; 4.  Flexural atopic dermatitis Educational information on eczema/atopic dermatitis provided to the patient as part of her after visit summary.  New prescription given for betamethasone which patient has used in the past and patient requested prednisone taper which she states has  helped to calm down similar rashes in the past.  She is also being provided for hydroxyzine/Atarax to take as needed up to 3 times daily for itching and prescription for cetirizine to take at bedtime to help with itching and atopic dermatitis.  She will also be referred to dermatology for further evaluation and treatment. - betamethasone dipropionate (DIPROLENE) 0.05 % ointment; Apply topically 2 (two) times daily.  Dispense: 30 g; Refill: 0 - hydrOXYzine (ATARAX/VISTARIL) 25 MG tablet; Take 1 tablet (25 mg total) by mouth 3 (three) times daily. For itching  Dispense: 30 tablet; Refill: 0 - cetirizine (ZYRTEC) 10 MG tablet; Take 1 tablet (10 mg total) by mouth at bedtime.  Dispense: 30 tablet; Refill: 4 - Ambulatory referral to Dermatology - predniSONE (DELTASONE) 20 MG tablet; Take 2 pills daily x 2 days then 1 pill daily x 2 days then 1/2 pill daily x 4 days; take after eating  Dispense: 8 tablet; Refill: 0  2. Moderate persistent asthma without complication She reports recurrent asthma symptoms but is currently only on albuterol on an as-needed basis.  She does not wish to start any new medications at this time, refill of ProAir provided - albuterol (PROAIR HFA) 108 (90 Base) MCG/ACT inhaler; Inhale 2 puffs into the lungs every 6 (six) hours as needed for wheezing or shortness of breath.  Dispense: 18 g; Refill: 1  3. GAD (generalized anxiety disorder) She reports longstanding issues with generalized anxiety but is not sure which medications that she has taken in the past.  She believes that she may have taken sertraline/Zoloft which was not helpful.  She agrees to try the use of Lexapro 10 mg daily and she has been asked to follow-up in the next 4 to 6 weeks for reevaluation to make sure that she is doing well on this dose and does not need an increase.  She should call or return sooner if she has any issues with the medication.  She has been asked to go to the emergency department if she develops an  acute worsening of anxiety, suicidal thoughts or ideations or any other concerns. - escitalopram (LEXAPRO) 10 MG tablet; Take 1 tablet (10 mg total) by mouth daily.  Dispense: 30 tablet; Refill: 1    Follow-up: Return for anxiety/new med-f/u in 4-5 weeks.    Cain Saupe, MD

## 2019-11-13 MED FILL — ESCITALOPRAM 10 MG TABLET: 10 | 30 days supply | Qty: 30 | Fill #0

## 2019-11-13 MED FILL — hydrOXYzine HCL 25 MG TABS: 25 | 10 days supply | Qty: 30 | Fill #0

## 2019-11-13 MED FILL — BETAMETHASONE DP 0.05% OINT: 0.05 | 14 days supply | Qty: 30 | Fill #0

## 2019-11-13 MED FILL — predniSONE 20 MG TABS: 20 | 9 days supply | Qty: 8 | Fill #0

## 2019-11-26 ENCOUNTER — Ambulatory Visit: Payer: Self-pay | Admitting: *Deleted

## 2019-11-26 NOTE — Telephone Encounter (Signed)
Patient states she started having symptoms 1 week after started- no better- getting some worse- patient wants to discuss changing medication. Can she have a sooner appointment. Patient states she has taken Wellbutrin in the past without SE- can she be changed to that?  Reason for Disposition . [1] Caller has URGENT medicine question about med that PCP or specialist prescribed AND [2] triager unable to answer question  Answer Assessment - Initial Assessment Questions 1. NAME of MEDICATION: "What medicine are you calling about?"     lexapro 10 mg 2. QUESTION: "What is your question?" (e.g., medication refill, side effect)     Side effects to medication 3. PRESCRIBING HCP: "Who prescribed it?" Reason: if prescribed by specialist, call should be referred to that group.     [facility] 4. SYMPTOMS: "Do you have any symptoms?"     Sleepy- more anxiety- patient has appointment to for follow up and had planned to discuss SE then- but now with appointment being pushed out- she doesn't think she can wait. 5. SEVERITY: If symptoms are present, ask "Are they mild, moderate or severe?"     Moderate/severe 6. PREGNANCY:  "Is there any chance that you are pregnant?" "When was your last menstrual period?"     No- BTL  Protocols used: MEDICATION QUESTION CALL-A-AH

## 2019-11-26 NOTE — Telephone Encounter (Signed)
Called pt able to make earlier appt . Scheduled to See [name] [name] on 12/01/2019. Pt agreed

## 2019-12-01 ENCOUNTER — Other Ambulatory Visit: Payer: Self-pay | Admitting: Family

## 2019-12-01 ENCOUNTER — Ambulatory Visit: Payer: Self-pay | Attending: Family | Admitting: Family

## 2019-12-01 ENCOUNTER — Other Ambulatory Visit: Payer: Self-pay

## 2019-12-01 ENCOUNTER — Other Ambulatory Visit: Payer: Self-pay | Admitting: Family Medicine

## 2019-12-01 ENCOUNTER — Encounter: Payer: Self-pay | Admitting: Family

## 2019-12-01 VITALS — BP 148/87 | HR 87 | Temp 97.2°F | Wt 186.2 lb

## 2019-12-01 DIAGNOSIS — L309 Dermatitis, unspecified: Secondary | ICD-10-CM

## 2019-12-01 DIAGNOSIS — F411 Generalized anxiety disorder: Secondary | ICD-10-CM

## 2019-12-01 MED ORDER — PAROXETINE HCL 10 MG PO TABS: 10.0000 mg | ORAL_TABLET | Freq: Every day | ORAL | 0 refills | Status: DC

## 2019-12-01 MED FILL — !VENTOLIN HFA INHALER: 108 (90 BAS | 25 days supply | Qty: 18 | Fill #1

## 2019-12-01 MED FILL — BETAMETHASONE DP 0.05% OINT: 0.05 | 14 days supply | Qty: 30 | Fill #0

## 2019-12-01 MED FILL — ?CETIRIZINE HCL 10 MG TABLE: 10 | 30 days supply | Qty: 30 | Fill #0

## 2019-12-01 MED FILL — PAROXETINE HCL 10 MG TABS: 10 | 35 days supply | Qty: 35 | Fill #0

## 2019-12-01 NOTE — Progress Notes (Signed)
Pt eczema causing burning sensation and pain Stopped escitalopram-caused more anxiety and extreme sleepiness   hasnt been able to pick up ointment and triamcinolone cream

## 2019-12-01 NOTE — Patient Instructions (Addendum)
Paxil for anxiety.   Follow-up in 4 to 6 weeks or sooner if needed for anxiety. Report to emergency department or urgent care if symptoms worsen and/or become severe.  Pick-up Diprolene ointment, Hydroxyzine pills, and Cetirizine pills today for eczema.   Referral to Dermatology confirmed, please call [phone] for additional details. Generalized Anxiety Disorder, Adult Generalized anxiety disorder (GAD) is a mental health disorder. People with this condition constantly worry about everyday events. Unlike normal anxiety, worry related to GAD is not triggered by a specific event. These worries also do not fade or get better with time. GAD interferes with life functions, including relationships, work, and school. GAD can vary from mild to severe. People with severe GAD can have intense waves of anxiety with physical symptoms (panic attacks). What are the causes? The exact cause of GAD is not known. What increases the risk? This condition is more likely to develop in:  Women.  People who have a family history of anxiety disorders.  People who are very shy.  People who experience very stressful life events, such as the death of a loved one.  People who have a very stressful family environment. What are the signs or symptoms? People with GAD often worry excessively about many things in their lives, such as their health and family. They may also be overly concerned about:  Doing well at work.  Being on time.  Natural disasters.  Friendships. Physical symptoms of GAD include:  Fatigue.  Muscle tension or having muscle twitches.  Trembling or feeling shaky.  Being easily startled.  Feeling like your heart is pounding or racing.  Feeling out of breath or like you cannot take a deep breath.  Having trouble falling asleep or staying asleep.  Sweating.  Nausea, diarrhea, or irritable bowel syndrome (IBS).  Headaches.  Trouble concentrating or remembering  facts.  Restlessness.  Irritability. How is this diagnosed? Your health care provider can diagnose GAD based on your symptoms and medical history. You will also have a physical exam. The health care provider will ask specific questions about your symptoms, including how severe they are, when they started, and if they come and go. Your health care provider may ask you about your use of alcohol or drugs, including prescription medicines. Your health care provider may refer you to a mental health specialist for further evaluation. Your health care provider will do a thorough examination and may perform additional tests to rule out other possible causes of your symptoms. To be diagnosed with GAD, a person must have anxiety that:  Is out of his or her control.  Affects several different aspects of his or her life, such as work and relationships.  Causes distress that makes him or her unable to take part in normal activities.  Includes at least three physical symptoms of GAD, such as restlessness, fatigue, trouble concentrating, irritability, muscle tension, or sleep problems. Before your health care provider can confirm a diagnosis of GAD, these symptoms must be present more days than they are not, and they must last for six months or longer. How is this treated? The following therapies are usually used to treat GAD:  Medicine. Antidepressant medicine is usually prescribed for long-term daily control. Antianxiety medicines may be added in severe cases, especially when panic attacks occur.  Talk therapy (psychotherapy). Certain types of talk therapy can be helpful in treating GAD by providing support, education, and guidance. Options include: ? Cognitive behavioral therapy (CBT). People learn coping skills and techniques  to ease their anxiety. They learn to identify unrealistic or negative thoughts and behaviors and to replace them with positive ones. ? Acceptance and commitment therapy (ACT).  This treatment teaches people how to be mindful as a way to cope with unwanted thoughts and feelings. ? Biofeedback. This process trains you to manage your body's response (physiological response) through breathing techniques and relaxation methods. You will work with a therapist while machines are used to monitor your physical symptoms.  Stress management techniques. These include yoga, meditation, and exercise. A mental health specialist can help determine which treatment is best for you. Some people see improvement with one type of therapy. However, other people require a combination of therapies. Follow these instructions at home:  Take over-the-counter and prescription medicines only as told by your health care provider.  Try to maintain a normal routine.  Try to anticipate stressful situations and allow extra time to manage them.  Practice any stress management or self-calming techniques as taught by your health care provider.  Do not punish yourself for setbacks or for not making progress.  Try to recognize your accomplishments, even if they are small.  Keep all follow-up visits as told by your health care provider. This is important. Contact a health care provider if:  Your symptoms do not get better.  Your symptoms get worse.  You have signs of depression, such as: ? A persistently sad, cranky, or irritable mood. ? Loss of enjoyment in activities that used to bring you joy. ? Change in weight or eating. ? Changes in sleeping habits. ? Avoiding friends or family members. ? Loss of energy for normal tasks. ? Feelings of guilt or worthlessness. Get help right away if:  You have serious thoughts about hurting yourself or others. If you ever feel like you may hurt yourself or others, or have thoughts about taking your own life, get help right away. You can go to your nearest emergency department or call:  Your local emergency services (911 in the [place]  A suicide crisis  helpline, such as the National Suicide Prevention Lifeline at 618-328-2342. This is open 24 hours a day. Summary  Generalized anxiety disorder (GAD) is a mental health disorder that involves worry that is not triggered by a specific event.  People with GAD often worry excessively about many things in their lives, such as their health and family.  GAD may cause physical symptoms such as restlessness, trouble concentrating, sleep problems, frequent sweating, nausea, diarrhea, headaches, and trembling or muscle twitching.  A mental health specialist can help determine which treatment is best for you. Some people see improvement with one type of therapy. However, other people require a combination of therapies. This information is not intended to replace advice given to you by your health care provider. Make sure you discuss any questions you have with your health care provider. Document Revised: 12/01/2016 Document Reviewed: 11/09/2015 Elsevier Patient Education  2020 [employer].

## 2019-12-01 NOTE — Progress Notes (Signed)
Patient ID: [name] [name], female    DOB: 1985-07-17  MRN: [id number]  CC: Anxiety Follow-Up  Subjective: [name] [name] is a 34 y.o. female with history of asthma, eczema, depression, tachycardia, and generalized anxiety disorder who presents for anxiety follow-up.  1. ANXIETY FOLLOW-UP:  11/12/2019: Visit with Dr. Jillyn Hidden. Reports longstanding issues with generalized anxiety and not sure which medications that she has taken in the past. Believes she may have taken Sertraline (Zoloft) which was not helpful. Agrees to try Lexapro 10 mg daily and she has been asked to follow-up in the next 4 to 6 weeks for reevaluation to make sure that she is doing well on this dose and does not need an increase. She should call or return sooner if she has any issues with the medication. She has been asked to go to the emergency department if she develops an acute worsening of anxiety, suicidal thoughts or ideations or any other concerns.  11/26/2019: Patient spoke with nurse triage about Lexapro side effect stating she felt more anxiety and sleepiness. States had a follow-up appointment but needs to see provider sooner.   12/01/2019: Reports she quit taking Lexapro 3 days ago stating the medication caused her to be excessively sleepy and also made her anxiety worse. Duration:uncontrolled Anxious mood: yes  Excessive worrying: yes Irritability: yes  Sweating: no Nausea: no Palpitations:yes Hyperventilation: no Panic attacks: none recently Agoraphobia: yes  Obscessions/compulsions: yes  Depressed mood: comes and goes  Weight changes: gaining  Insomnia: yes hard to stay asleep  Hypersomnia: no  Fatigue/loss of energy: yes Feelings of worthlessness: no Feelings of guilt: denies Impaired concentration/indecisiveness: no Suicidal ideations: no  Crying spells: no Recent Stressors/Life Changes: no   Relationship problems: no   Family stress: no     Financial stress: yes    Job stress:  no    Recent death/loss: no  Depression screen [facility] 2/9 12/01/2019 11/12/2019 07/23/2019 06/05/2016 05/28/2015  Decreased Interest 2 3 0 0 0  Down, Depressed, Hopeless 1 3 0 0 0  PHQ - 2 Score 3 6 0 0 0  Altered sleeping 3 3 0 - -  Tired, decreased energy 3 3 0 - -  Change in appetite 1 3 0 - -  Feeling bad or failure about yourself  2 3 0 - -  Trouble concentrating 3 3 0 - -  Moving slowly or fidgety/restless 0 0 0 - -  Suicidal thoughts 0 0 0 - -  PHQ-9 Score 15 21 0 - -   GAD 7 : Generalized Anxiety Score 12/01/2019 07/23/2019  Nervous, Anxious, on Edge 3 0  Control/stop worrying 3 0  Worry too much - different things 3 0  Trouble relaxing 3 0  Restless 3 0  Easily annoyed or irritable 3 0  Afraid - awful might happen 3 0  Total GAD 7 Score 21 0    2. ECZEMA FOLLOW-UP: 11/12/2019: Visit with Dr. Jillyn Hidden. New prescription given for Betamethasone which patient used in the past and patient requested Prednisone taper which she states has helped to calm down similar rashes in the past. Also provided for Hydroxyzine/Atarax to take as needed up to 3 times daily for itching and prescription for Cetirizine to take at bedtime to help with itching and atopic dermatitis. She was also referred to Dermatology for further evaluation and treatment.   12/01/2019: Reports she completed course of Prednisone. States she was unable to pickup the Diprolene ointment, Hydroxyzine, and Cetirizine since last visit. Rash  has not improved and still uncomfortable. Has not received a call from Dermatology referral as of yet.    Patient Active Problem List   Diagnosis Date Noted  . Tobacco use disorder 12/07/2014  . Major depressive disorder, recurrent episode, moderate (HCC) 01/13/2014  . GAD (generalized anxiety disorder) 01/13/2014  . Tinea pedis of both feet 09/10/2013  . S/P tubal ligation 06/13/2013  . Admission for sterilization 06/12/2013  . Rash and nonspecific skin eruption 06/09/2013  . Neck  discomfort 02/26/2013  . Tachycardia 10/23/2012  . Allergic conjunctivitis of both eyes and rhinitis 10/14/2012  . Mastitis, Left. 11/27/2011  . Depression 03/24/2011  . Asthma 03/01/2006  . Eczema 03/01/2006     Current Outpatient Medications on File Prior to Visit  Medication Sig Dispense Refill  . albuterol (PROAIR HFA) 108 (90 Base) MCG/ACT inhaler Inhale 2 puffs into the lungs every 6 (six) hours as needed for wheezing or shortness of breath. 18 g 1  . cetirizine (ZYRTEC) 10 MG tablet Take 1 tablet (10 mg total) by mouth at bedtime. 30 tablet 4  . acetaminophen (TYLENOL) 500 MG tablet Take 500-1,000 mg by mouth every 6 (six) hours as needed for mild pain or headache.    . betamethasone dipropionate (DIPROLENE) 0.05 % ointment Apply topically 2 (two) times daily. (Patient not taking: Reported on 12/01/2019) 30 g 0  . hydrOXYzine (ATARAX/VISTARIL) 25 MG tablet Take 1 tablet (25 mg total) by mouth 3 (three) times daily. For itching (Patient not taking: Reported on 12/01/2019) 30 tablet 0  . predniSONE (DELTASONE) 20 MG tablet Take 2 pills daily x 2 days then 1 pill daily x 2 days then 1/2 pill daily x 4 days; take after eating (Patient not taking: Reported on 12/01/2019) 8 tablet 0  . triamcinolone cream (KENALOG) 0.1 % Apply 1 application topically 2 (two) times daily. (Patient not taking: Reported on 12/01/2019) 30 g 2  . [DISCONTINUED] famotidine (PEPCID) 20 MG tablet Take 1 tablet (20 mg total) by mouth 2 (two) times daily. 30 tablet 0  . [DISCONTINUED] gabapentin (NEURONTIN) 100 MG capsule Take 1 capsule (100 mg total) by mouth 3 (three) times daily. 60 capsule 0   No current facility-administered medications on file prior to visit.    Allergies  Allergen Reactions  . Doxycycline Photosensitivity    Social History   Socioeconomic History  . Marital status: Divorced    Spouse name: Not on file  . Number of children: Not on file  . Years of education: Not on file  . Highest  education level: Not on file  Occupational History  . Not on file  Tobacco Use  . Smoking status: Current Every Day Smoker    Packs/day: 1.00    Years: 5.00    Pack years: 5.00    Types: Cigarettes  . Smokeless tobacco: Never Used  Substance and Sexual Activity  . Alcohol use: Yes    Comment: occasional  . Drug use: No  . Sexual activity: Yes    Birth control/protection: Surgical  Other Topics Concern  . Not on file  Social History Narrative   Lives with boyfriend Eligah East and his son Sheria Lang 2006   Not currently working    Social Determinants of Health   Financial Resource Strain:   . Difficulty of Paying Living Expenses: Not on file  Food Insecurity:   . Worried About Programme researcher, broadcasting/film/video in the Last Year: Not on file  . Ran Out of Food in the Last  Year: Not on file  Transportation Needs:   . Lack of Transportation (Medical): Not on file  . Lack of Transportation (Non-Medical): Not on file  Physical Activity:   . Days of Exercise per Week: Not on file  . Minutes of Exercise per Session: Not on file  Stress:   . Feeling of Stress : Not on file  Social Connections:   . Frequency of Communication with Friends and Family: Not on file  . Frequency of Social Gatherings with Friends and Family: Not on file  . Attends Religious Services: Not on file  . Active Member of Clubs or Organizations: Not on file  . Attends Banker Meetings: Not on file  . Marital Status: Not on file  Intimate Partner Violence:   . Fear of Current or Ex-Partner: Not on file  . Emotionally Abused: Not on file  . Physically Abused: Not on file  . Sexually Abused: Not on file    Family History  Problem Relation Age of Onset  . Healthy Mother   . Anxiety disorder Mother   . Healthy Father   . Stroke Maternal Grandmother   . Stroke Paternal Grandfather   . Bipolar disorder Neg Hx   . Depression Neg Hx     Past Surgical History:  Procedure Laterality Date  . INCISE AND  DRAIN ABCESS     left breast x 2  . INCISION AND DRAINAGE ABSCESS Left 02/17/2012   Procedure:  DRAINAGEof recurrent breast abscess;  Surgeon: Shelly Rubenstein, MD;  Location: [facility] OR;  Service: General;  Laterality: Left;  . LAPAROSCOPIC TUBAL LIGATION Bilateral 06/13/2013   Procedure: LAPAROSCOPIC TUBAL LIGATION;  Surgeon: Sherian Rein, MD;  Location: [facility] ORS;  Service: Gynecology;  Laterality: Bilateral;    ROS: Review of Systems Negative except as stated above  PHYSICAL EXAM: BP (!) 148/87 (BP Location: Left Arm, Patient Position: Sitting)   Pulse 87   Temp (!) 97.2 F (36.2 C)   Wt 186 lb 3.2 oz (84.5 kg)   SpO2 99%   BMI 36.36 kg/m   Wt Readings from Last 3 Encounters:  12/01/19 186 lb 3.2 oz (84.5 kg)  11/12/19 182 lb 3.2 oz (82.6 kg)  04/21/19 160 lb (72.6 kg)    Physical Exam Constitutional:      Appearance: Normal appearance. She is obese.  HENT:     Head: Normocephalic.  Eyes:     Pupils: Pupils are equal, round, and reactive to light.  Cardiovascular:     Rate and Rhythm: Normal rate and regular rhythm.     Pulses: Normal pulses.     Heart sounds: Normal heart sounds.  Pulmonary:     Effort: Pulmonary effort is normal.     Breath sounds: Normal breath sounds.  Musculoskeletal:     Cervical back: Normal range of motion. Tenderness present.  Skin:    Coloration: Skin is not jaundiced.     Findings: Rash present.     Comments: Rash (plaque-like erythema/pink rash on bilateral forearms, neck, upper and lower lips, and bilateral eyes)  Neurological:     General: No focal deficit present.     Mental Status: She is alert and oriented to person, place, and time.  Psychiatric:        Mood and Affect: Mood normal.        Behavior: Behavior normal.    ASSESSMENT AND PLAN: 1. GAD (generalized anxiety disorder): - Prescribed Escitalopram on 11/12/2019 for anxiety. Reports within one week she  began to feel side effects of increased anxiety and excessive  sleepiness. Discontinued medication 3 days ago.  - Previously on Wellbutrin in 2013-2014 for postpartum depression and then again in 2016-2017 for smoking cessation.   - Tried course of Citalopram in 2013 without success.  - In the past has been on Fluoxetine and Quetiapine. - Denies thoughts of suicidal ideations and thoughts of self-harm. - Begin low-dose course of Paroxetine for anxiety. Follow-up with primary physician in 4 to 6 weeks or sooner if needed. - Counseled to not consume alcohol and illicit substances while prescribed medication. Patient verbalized understanding. - Discussed possible side effects of medication including but not limited to increased anxiety, suicidal ideation, thoughts of self-harm, dizziness, drowsiness, headache, and insomnia. Notify provider immediately should any side effects occur and seek immediate medical attention. Patient verbalized understanding. - Report to emergency department/urgent care if symptoms worsen and/or become severe. Patient verbalized understanding. - PARoxetine (PAXIL) 10 MG tablet; Take 1 tablet (10 mg total) by mouth daily.  Dispense: 35 tablet; Refill: 0  2. Eczema, unspecified type: - Visit 11/12/2019 and prescribed Betamethasone Dipropionate ointment, Hydroxyzine, Cetirizine, Prednisone taper, and referred to Dermatology.  - Today reports she has completed course of Prednisone taper. Was unable to pickup remaining medications and has not heard from Dermatology referral as of yet.  - Counseled patient to pickup remaining medications today from the clinic pharmacy. Patient agreeable.  - I was able to [facility] coordinator that referral was placed on 11/21/2019. Today patient was also given contact name and phone number [phone] [facility] for Dermatology referral.  - Follow-up with primary physician as scheduled.   Patient was given the opportunity to ask  questions.  Patient verbalized understanding of the plan and was able to repeat key elements of the plan. Patient was given clear instructions to go to Emergency Department or return to medical center if symptoms don't improve, worsen, or new problems develop.The patient verbalized understanding.   Requested Prescriptions   Signed Prescriptions Disp Refills  . PARoxetine (PAXIL) 10 MG tablet 35 tablet 0    Sig: Take 1 tablet (10 mg total) by mouth daily.    Return in about 6 weeks (around 01/12/2020) for primary physician.  Rema FendtAmy J Amyah Clawson, NP

## 2019-12-02 MED FILL — hydrOXYzine HCL 25 MG TABS: 25 | 10 days supply | Qty: 30 | Fill #0

## 2019-12-10 ENCOUNTER — Ambulatory Visit: Payer: Medicaid Other | Admitting: Family Medicine

## 2019-12-18 ENCOUNTER — Ambulatory Visit (INDEPENDENT_AMBULATORY_CARE_PROVIDER_SITE_OTHER): Payer: Self-pay | Admitting: Family Medicine

## 2019-12-18 ENCOUNTER — Other Ambulatory Visit: Payer: Self-pay | Admitting: Family Medicine

## 2019-12-18 ENCOUNTER — Other Ambulatory Visit: Payer: Self-pay

## 2019-12-18 DIAGNOSIS — L309 Dermatitis, unspecified: Secondary | ICD-10-CM

## 2019-12-18 MED ORDER — TRIAMCINOLONE ACETONIDE 0.5 % EX OINT
1.0000 | TOPICAL_OINTMENT | Freq: Two times a day (BID) | CUTANEOUS | 4 refills | Status: DC
Start: 2019-12-18 — End: 2020-03-30

## 2019-12-18 MED ORDER — HYDROXYZINE HCL 25 MG PO TABS: 25.0000 mg | ORAL_TABLET | Freq: Three times a day (TID) | ORAL | 2 refills | Status: DC

## 2019-12-18 MED FILL — hydrOXYzine HCL 25 MG TABS: 25 | 20 days supply | Qty: 60 | Fill #0

## 2019-12-18 MED FILL — TRIAMCINOLONE 0.5% OINTMENT: 0.5 | 30 days supply | Qty: 60 | Fill #0

## 2019-12-18 NOTE — Patient Instructions (Signed)
Good to see you  Use the Triamcinolone ointment twice a day every day until under good control then go to once a day  Use the atarax as needed when itchy  Have a humidifier in your bedroom  Use Basis or Dove soap bath only as needed  Anytime your skin is dry or itchy use vaseline very freely  Call back in not improved in 2 weeks

## 2019-12-18 NOTE — Assessment & Plan Note (Addendum)
Severe.  A bit unusual for her age to be so severe but does correlate with her asthma diagnosis and distribution and response to medications are very typical.   If does not respond to standard therapy may need to see an allergist for avoidance testing. Will change topical steroid to Triamcinolone 0.5% ointment to use very liberally twice a day.  Recommend very frequent use of vaseline whenever skin is the least dry or is itching.  Bedroom humidifier.  Atarax as needed.   See after visit summary for details

## 2019-12-18 NOTE — Progress Notes (Signed)
    SUBJECTIVE:   CHIEF COMPLAINT / HPI:   Eczema Has had for years but recently had a flair.  The Diprolene ointment she had been using has improved things but is running out. Prednisone also helped.  Showers QOD with scentless soap.  Uses an eczema lotion during the day.  No humidifier   PERTINENT  PMH / PSH: Asthma.  No history of cancer or any immune modulating meds  OBJECTIVE:   BP 110/80   Pulse 83   Ht 5' (1.524 m)   Wt 190 lb 3.2 oz (86.3 kg)   SpO2 98%   BMI 37.15 kg/m   Diffuse very dry skin on hands feet and extremities Skin is red and cracked on hands feet and red blotchy around neck and flexure surfaces No streaking or purulent discharge   ASSESSMENT/PLAN:   Eczema Severe.  A bit unusual for her age to be so severe but does correlate with her asthma diagnosis and distribution and response to medications are very typical.   If does not respond to standard therapy may need to see an allergist for avoidance testing. Will change topical steroid to Triamcinolone 0.5% ointment to use very liberally twice a day.  Recommend very frequent use of vaseline whenever skin is the least dry or is itching.  Bedroom humidifier.  Atarax as needed.   See after visit summary for details      Carney Living, MD [facility]

## 2019-12-24 ENCOUNTER — Ambulatory Visit: Payer: Medicaid Other | Admitting: Physician Assistant

## 2020-01-07 ENCOUNTER — Other Ambulatory Visit: Payer: Self-pay

## 2020-01-07 ENCOUNTER — Encounter: Payer: Self-pay | Admitting: Physician Assistant

## 2020-01-07 ENCOUNTER — Ambulatory Visit: Payer: Self-pay | Attending: Physician Assistant | Admitting: Physician Assistant

## 2020-01-07 ENCOUNTER — Other Ambulatory Visit: Payer: Self-pay | Admitting: Physician Assistant

## 2020-01-07 DIAGNOSIS — J4540 Moderate persistent asthma, uncomplicated: Secondary | ICD-10-CM

## 2020-01-07 DIAGNOSIS — F411 Generalized anxiety disorder: Secondary | ICD-10-CM

## 2020-01-07 DIAGNOSIS — L309 Dermatitis, unspecified: Secondary | ICD-10-CM

## 2020-01-07 MED ORDER — ALBUTEROL SULFATE HFA 108 (90 BASE) MCG/ACT IN AERS: 2.0000 | INHALATION_SPRAY | Freq: Four times a day (QID) | RESPIRATORY_TRACT | 1 refills | Status: DC | PRN

## 2020-01-07 MED ORDER — CETIRIZINE HCL 10 MG PO TABS: 10.0000 mg | ORAL_TABLET | Freq: Every day | ORAL | 4 refills | Status: DC

## 2020-01-07 MED ORDER — PAROXETINE HCL 20 MG PO TABS: 20.0000 mg | ORAL_TABLET | Freq: Every day | ORAL | 3 refills | Status: DC

## 2020-01-07 MED ORDER — HYDROXYZINE HCL 25 MG PO TABS: 25.0000 mg | ORAL_TABLET | Freq: Three times a day (TID) | ORAL | 2 refills | Status: DC

## 2020-01-07 MED FILL — hydrOXYzine HCL 25 MG TABS: 25 | 20 days supply | Qty: 60 | Fill #0

## 2020-01-07 MED FILL — ALBUTEROL SULFATE HFA 108 (: 108 (90 BAS | 25 days supply | Qty: 18 | Fill #0

## 2020-01-07 MED FILL — ?PAROXETINE HCL 20 MG TABLE: 20 | 30 days supply | Qty: 30 | Fill #0

## 2020-01-07 MED FILL — ?CETIRIZINE HCL 10 MG TABLE: 10 | 30 days supply | Qty: 30 | Fill #0

## 2020-01-07 NOTE — Progress Notes (Signed)
Virtual Visit via Telephone Note  I connected with [name] [name] on 01/07/20 at  4:10 PM EST by telephone and verified that I am speaking with the correct person using two identifiers.  Location: Patient: [name] [name] Provider: Georgian Co, PA-C   I discussed the limitations, risks, security and privacy concerns of performing an evaluation and management service by telephone and the availability of in person appointments. I also discussed with the patient that there may be a patient responsible charge related to this service. The patient expressed understanding and agreed to proceed.  PATIENT visit by telephone virtually in the context of Covid-19 pandemic. Patient location:  home My Location:  [facility] office Persons on the call:  Me and the patient.     History of Present Illness:  Patient has tried many different anxiety and depression medications.  Recently started on paxil and does feel it is making a slight difference.  She is worrying less.  She feels less irritable.  No panic attacks in a while.  Doing better around people.  No SI/HI.  No adverse SE.     Observations/Objective: NAD.  A&Ox3   Assessment and Plan: 1. GAD (generalized anxiety disorder) Increase dose bc doing better with still some symptoms - PARoxetine (PAXIL) 20 MG tablet; Take 1 tablet (20 mg total) by mouth daily.  Dispense: 30 tablet; Refill: 3 - Ambulatory referral to Social Work  2. Eczema, unspecified type - hydrOXYzine (ATARAX/VISTARIL) 25 MG tablet; Take 1 tablet (25 mg total) by mouth 3 (three) times daily. For itching  Dispense: 60 tablet; Refill: 2 - cetirizine (ZYRTEC) 10 MG tablet; Take 1 tablet (10 mg total) by mouth at bedtime.  Dispense: 30 tablet; Refill: 4  3. Moderate persistent asthma without complication - albuterol (PROAIR HFA) 108 (90 Base) MCG/ACT inhaler; Inhale 2 puffs into the lungs every 6 (six) hours as needed for wheezing or shortness of breath.  Dispense: 18 g; Refill:  1    Follow Up Instructions: Jasmine in 1-2 weeks;  Assign PCP in 6-8 weeks   I discussed the assessment and treatment plan with the patient. The patient was provided an opportunity to ask questions and all were answered. The patient agreed with the plan and demonstrated an understanding of the instructions.   The patient was advised to call back or seek an in-person evaluation if the symptoms worsen or if the condition fails to improve as anticipated.  I provided 17 minutes of non-face-to-face time during this encounter.   Georgian Co, PA-C  Patient ID: [name] [name], female   DOB: [birth year], 35 y.o.   MRN: [id number]

## 2020-02-09 ENCOUNTER — Other Ambulatory Visit: Payer: Self-pay | Admitting: Family

## 2020-02-09 DIAGNOSIS — F411 Generalized anxiety disorder: Secondary | ICD-10-CM

## 2020-02-09 MED FILL — ALBUTEROL SULFATE HFA 108 (: 108 (90 BAS | 25 days supply | Qty: 18 | Fill #1

## 2020-02-11 ENCOUNTER — Other Ambulatory Visit: Payer: Self-pay

## 2020-02-11 ENCOUNTER — Ambulatory Visit: Payer: Self-pay | Attending: Family Medicine | Admitting: Licensed Clinical Social Worker

## 2020-02-11 DIAGNOSIS — F411 Generalized anxiety disorder: Secondary | ICD-10-CM

## 2020-02-11 NOTE — BH Specialist Note (Signed)
MSW Intern called to meet with pt for scheduled appt based on referral from [facility]. Pt reports some alleviation of anxiety symptoms on current dose of Paxil, however would like an increase in dosage or a "booster" supplement to increase its effectiveness. MSW Intern offered options of therapy and a referral to [facility], but pt declined and decided to try and seek medication adjustments through a PCP here. An appt was made for March 29th at 2:30 pm with Dr. Delford Field for these concerns. Pt can follow up with SW services if desired.

## 2020-02-13 MED FILL — ?PAROXETINE HCL 20 MG TABLE: 20 | 30 days supply | Qty: 30 | Fill #1

## 2020-03-24 MED FILL — !VENTOLIN HFA INHALER: 108 (90 BAS | 25 days supply | Qty: 18 | Fill #1

## 2020-03-24 MED FILL — PARoxetine HCL 20 MG TABS: 20 | 30 days supply | Qty: 30 | Fill #2

## 2020-03-24 MED FILL — ?CETIRIZINE HCL 10 MG TABLE: 10 | 30 days supply | Qty: 30 | Fill #1

## 2020-03-30 ENCOUNTER — Ambulatory Visit: Payer: Self-pay | Attending: Critical Care Medicine | Admitting: Critical Care Medicine

## 2020-03-30 ENCOUNTER — Other Ambulatory Visit: Payer: Self-pay

## 2020-03-30 ENCOUNTER — Other Ambulatory Visit: Payer: Self-pay | Admitting: Critical Care Medicine

## 2020-03-30 ENCOUNTER — Encounter: Payer: Self-pay | Admitting: Critical Care Medicine

## 2020-03-30 VITALS — BP 135/83 | HR 90 | Ht 60.0 in | Wt 198.6 lb

## 2020-03-30 DIAGNOSIS — Z124 Encounter for screening for malignant neoplasm of cervix: Secondary | ICD-10-CM

## 2020-03-30 DIAGNOSIS — F411 Generalized anxiety disorder: Secondary | ICD-10-CM

## 2020-03-30 DIAGNOSIS — F17210 Nicotine dependence, cigarettes, uncomplicated: Secondary | ICD-10-CM | POA: Insufficient documentation

## 2020-03-30 DIAGNOSIS — J4540 Moderate persistent asthma, uncomplicated: Secondary | ICD-10-CM

## 2020-03-30 DIAGNOSIS — F331 Major depressive disorder, recurrent, moderate: Secondary | ICD-10-CM

## 2020-03-30 DIAGNOSIS — L309 Dermatitis, unspecified: Secondary | ICD-10-CM

## 2020-03-30 DIAGNOSIS — F17200 Nicotine dependence, unspecified, uncomplicated: Secondary | ICD-10-CM

## 2020-03-30 DIAGNOSIS — Z79899 Other long term (current) drug therapy: Secondary | ICD-10-CM | POA: Insufficient documentation

## 2020-03-30 DIAGNOSIS — Z1159 Encounter for screening for other viral diseases: Secondary | ICD-10-CM

## 2020-03-30 MED ORDER — ALBUTEROL SULFATE HFA 108 (90 BASE) MCG/ACT IN AERS: 2.0000 | INHALATION_SPRAY | Freq: Four times a day (QID) | RESPIRATORY_TRACT | 1 refills | Status: DC | PRN

## 2020-03-30 MED ORDER — CETIRIZINE HCL 10 MG PO TABS: 10.0000 mg | ORAL_TABLET | Freq: Every day | ORAL | 4 refills | Status: DC

## 2020-03-30 MED ORDER — HYDROXYZINE HCL 25 MG PO TABS: 25.0000 mg | ORAL_TABLET | Freq: Three times a day (TID) | ORAL | 2 refills | Status: DC

## 2020-03-30 MED ORDER — PAROXETINE HCL 20 MG PO TABS: 20.0000 mg | ORAL_TABLET | Freq: Every day | ORAL | 3 refills | Status: DC

## 2020-03-30 MED ORDER — TRIAMCINOLONE ACETONIDE 0.5 % EX OINT: 1.0000 "application " | TOPICAL_OINTMENT | Freq: Two times a day (BID) | CUTANEOUS | 4 refills | Status: DC

## 2020-03-30 NOTE — Assessment & Plan Note (Signed)
Mild intermittent asthma plan for this patient will be continue ProAir as needed

## 2020-03-30 NOTE — Progress Notes (Signed)
Subjective:    Patient ID: [name] [name], female    DOB: 1985-02-26, 35 y.o.   MRN: [id number]  35 y.o.F here to est PCP  03/30/2020 This is a pleasant 35 year old female previous history of asthma eczema and general anxiety disorder.  She was seen in February to establish into the clinic by one of our PAs started on Paxil 10 mg a day then had a dose increase to 20 mg a day but she just picked up 20 mg dose last week.  Patient was also been seen by dermatology for eczema and is on triamcinolone ointment which helps the eczema on the arms.  Patient had 2 children both delivered vaginally she now has bilateral tubes tied and does not wish further children.  She does vape nicotine products try to cut down the mountain nicotine consumption.  Her anxiety level is moderate it is improved on the Paxil but not 100% improved she is yet to see mental health provider this recommended the last visit.  Patient states she has no other complaints at this time.    Past Medical History:  Diagnosis Date  . Admission for sterilization 06/12/2013  . Asthma   . Breast abscess   . Depression    HX of - not on med n, doing well.  . Eczema   . Family history of anesthesia complication    Grandmother has difficulty waskin up.  . Pneumonia    HX only -years ago has had a few times.  . Pregnant state, incidental   . S/P tubal ligation 06/13/2013  . SVD (spontaneous vaginal delivery)    x 2     Family History  Problem Relation Age of Onset  . Healthy Mother   . Anxiety disorder Mother   . Healthy Father   . Stroke Maternal Grandmother   . Stroke Paternal Grandfather   . Bipolar disorder Neg Hx   . Depression Neg Hx      Social History   Socioeconomic History  . Marital status: Divorced    Spouse name: Not on file  . Number of children: Not on file  . Years of education: Not on file  . Highest education level: Not on file  Occupational History  . Not on file  Tobacco Use  . Smoking  status: Current Every Day Smoker    Packs/day: 1.00    Years: 5.00    Pack years: 5.00    Types: Cigarettes  . Smokeless tobacco: Never Used  Substance and Sexual Activity  . Alcohol use: Yes    Comment: occasional  . Drug use: No  . Sexual activity: Yes    Birth control/protection: Surgical  Other Topics Concern  . Not on file  Social History Narrative   Lives with boyfriend Eligah East and his son Sheria Lang 2006   Not currently working    Social Determinants of Health   Financial Resource Strain: Not on [employer] Insecurity: Not on file  Transportation Needs: Not on file  Physical Activity: Not on file  Stress: Not on file  Social Connections: Not on file  Intimate Partner Violence: Not on file     Allergies  Allergen Reactions  . Doxycycline Photosensitivity     Outpatient Medications Prior to Visit  Medication Sig Dispense Refill  . albuterol (PROAIR HFA) 108 (90 Base) MCG/ACT inhaler Inhale 2 puffs into the lungs every 6 (six) hours as needed for wheezing or shortness of breath. 18 g 1  . cetirizine (ZYRTEC)  10 MG tablet Take 1 tablet (10 mg total) by mouth at bedtime. 30 tablet 4  . hydrOXYzine (ATARAX/VISTARIL) 25 MG tablet Take 1 tablet (25 mg total) by mouth 3 (three) times daily. For itching 60 tablet 2  . PARoxetine (PAXIL) 20 MG tablet Take 1 tablet (20 mg total) by mouth daily. 30 tablet 3  . triamcinolone ointment (KENALOG) 0.5 % Apply 1 application topically 2 (two) times daily. 180 g 4  . acetaminophen (TYLENOL) 500 MG tablet Take 500-1,000 mg by mouth every 6 (six) hours as needed for mild pain or headache.     No facility-administered medications prior to visit.     Review of Systems  HENT: Negative.   Eyes: Negative.   Respiratory: Negative.   Gastrointestinal: Positive for abdominal distention and diarrhea.       IBS , small caliber stools  Genitourinary: Negative.   Musculoskeletal: Negative.   Skin: Positive for rash.       Hx of  eczema  Neurological: Positive for headaches.  Psychiatric/Behavioral: Positive for dysphoric mood and sleep disturbance. Negative for self-injury and suicidal ideas. The patient is nervous/anxious. The patient is not hyperactive.        Trouble staying asleep       Objective:   Physical Exam Vitals:   03/30/20 1436  BP: 135/83  Pulse: 90  SpO2: 97%  Weight: 198 lb 9.6 oz (90.1 kg)  Height: 5' (1.524 m)    Gen: Pleasant, well-nourished, in no distress,  normal affect  ENT: No lesions,  mouth clear,  oropharynx clear, no postnasal drip  Neck: No JVD, no TMG, no carotid bruits  Lungs: No use of accessory muscles, no dullness to percussion, clear without rales or rhonchi  Cardiovascular: RRR, heart sounds normal, no murmur or gallops, no peripheral edema  Abdomen: soft and NT, no HSM,  BS normal  Musculoskeletal: No deformities, no cyanosis or clubbing  Neuro: alert, non focal  Skin: Warm, no lesions eczema near the antecubital area of both elbows both upper arms   No lab draws since 2019 seen in the system      Assessment & Plan:  I personally reviewed all images and lab data in the [facility] system as well as any outside material available during this office visit and agree with the  radiology impressions.   Asthma Mild intermittent asthma plan for this patient will be continue ProAir as needed  Eczema Eczema in both upper arms we will continue triamcinolone ointment and also recommended good skin moisturizer  GAD (generalized anxiety disorder) Moderate generalized anxiety disorder with associated depression  We will continue Paxil at 20 mg daily  Referral to psychiatry made  Tobacco use disorder    . Current smoking consumption amount: 1 cartridge vaping a day  . Dicsussion on advise to quit smoking and smoking impacts: Cardiovascular lung impacts . Note recent data showing 1 cannot get off nicotine successfully using a vaping cessation method  . Patient's  willingness to quit: Wanting to quit  . Methods to quit smoking discussed: Behavioral modification  . Medication management of smoking session drugs discussed: Not indicated  . Resources provided:  AVS   . Setting quit date not established  . Follow-up arranged 4 months   Time spent counseling the patient: 5 minutes    Harveen was seen today for establish care.  Diagnoses and all orders for this visit:  Cervical cancer screening -     Ambulatory referral to Gynecology  MDD (major  depressive disorder), recurrent episode, moderate (HCC) -     Ambulatory referral to Psychiatry -     Thyroid Panel With TSH  Moderate persistent asthma without complication -     albuterol (PROAIR HFA) 108 (90 Base) MCG/ACT inhaler; Inhale 2 puffs into the lungs every 6 (six) hours as needed for wheezing or shortness of breath. -     CBC with Differential/Platelet  Eczema, unspecified type -     cetirizine (ZYRTEC) 10 MG tablet; Take 1 tablet (10 mg total) by mouth at bedtime. -     hydrOXYzine (ATARAX/VISTARIL) 25 MG tablet; Take 1 tablet (25 mg total) by mouth 3 (three) times daily. For itching -     Comprehensive metabolic panel -     CBC with Differential/Platelet  GAD (generalized anxiety disorder) -     Ambulatory referral to Psychiatry -     PARoxetine (PAXIL) 20 MG tablet; Take 1 tablet (20 mg total) by mouth daily. -     Thyroid Panel With TSH  Need for hepatitis C screening test -     HCV Ab w Reflex to Quant PCR  Tobacco use disorder  Other orders -     triamcinolone ointment (KENALOG) 0.5 %; Apply 1 application topically 2 (two) times daily.  Referral to gynecology for Pap smear was made  Complete primary care lab screen obtained including hepatitis C thyroid function blood counts metabolic panel  Patient agreed to a tetanus vaccine but declined to receive a COVID vaccination I did inform her if she becomes ill with Covid to contact us immediately for oral antiviral  therapy and to be tested immediately

## 2020-03-30 NOTE — Patient Instructions (Signed)
Refills on all your medications sent to our pharmacy  Referral to psychiatry was made  Referral to gynecology for a Pap smear was made  Tetanus vaccine was given  Labs today include metabolic panel blood counts hemoglobin A1c hepatitis C thyroid testing  Return to see Dr. Delford Field 3 months  Continue to work on tobacco cessation  See below regarding abdominal bloating and a proper diet for irritable bowel  See below regarding tobacco cessation   Tobacco Use Disorder Tobacco use disorder (TUD) occurs when a person craves, seeks, and uses tobacco, regardless of the consequences. This disorder can cause problems with mental and physical health. It can affect your ability to have healthy relationships, and it can keep you from meeting your responsibilities at work, home, or school. Tobacco may be:  Smoked as a cigarette or cigar.  Inhaled using e-cigarettes.  Smoked in a pipe or hookah.  Chewed as smokeless tobacco.  Inhaled into the nostrils as snuff. Tobacco products contain a dangerous chemical called nicotine, which is very addictive. Nicotine triggers hormones that make the body feel stimulated and works on areas of the brain that make you feel good. These effects can make it hard for people to quit nicotine. Tobacco contains many other unsafe chemicals that can damage almost every organ in the body. Smoking tobacco also puts others in danger due to fire risk and possible health problems caused by breathing in secondhand smoke. What are the signs or symptoms? Symptoms of TUD may include:  Being unable to slow down or stop your tobacco use.  Spending an abnormal amount of time getting or using tobacco.  Craving tobacco. Cravings may last for up to 6 months after quitting.  Tobacco use that: ? Interferes with your work, school, or home life. ? Interferes with your personal and social relationships. ? Makes you give up activities that you once enjoyed or found  important.  Using tobacco even though you know that it is: ? Dangerous or bad for your health or someone else's health. ? Causing problems in your life.  Needing more and more of the substance to get the same effect (developing tolerance).  Experiencing unpleasant symptoms if you do not use the substance (withdrawal). Withdrawal symptoms may include: ? Depressed, anxious, or irritable mood. ? Difficulty concentrating. ? Increased appetite. ? Restlessness or trouble sleeping.  Using the substance to avoid withdrawal. How is this diagnosed? This condition may be diagnosed based on:  Your current and past tobacco use. Your health care provider may ask questions about how your tobacco use affects your life.  A physical exam. You may be diagnosed with TUD if you have at least two symptoms within a 40-month period. How is this treated? This condition is treated by stopping tobacco use. Many people are unable to quit on their own and need help. Treatment may include:  Nicotine replacement therapy (NRT). NRT provides nicotine without the other harmful chemicals in tobacco. NRT gradually lowers the dosage of nicotine in the body and reduces withdrawal symptoms. NRT is available as: ? Over-the-counter gums, lozenges, and skin patches. ? Prescription mouth inhalers and nasal sprays.  Medicine that acts on the brain to reduce cravings and withdrawal symptoms.  A type of talk therapy that examines your triggers for tobacco use, how to avoid them, and how to cope with cravings (behavioral therapy).  Hypnosis. This may help with withdrawal symptoms.  Joining a support group for others coping with TUD. The best treatment for TUD is usually  a combination of medicine, talk therapy, and support groups. Recovery can be a long process. Many people start using tobacco again after stopping (relapse). If you relapse, it does not mean that treatment will not work. Follow these instructions at  home: Lifestyle  Do not use any products that contain nicotine or tobacco, such as cigarettes and e-cigarettes.  Avoid things that trigger tobacco use as much as you can. Triggers include people and situations that usually cause you to use tobacco.  Avoid drinks that contain caffeine, including coffee. These may worsen some withdrawal symptoms.  Find ways to manage stress. Wanting to smoke may cause stress, and stress can make you want to smoke. Relaxation techniques such as deep breathing, meditation, and yoga may help.  Attend support groups as needed. These groups are an important part of long-term recovery for many people. General instructions  Take over-the-counter and prescription medicines only as told by your health care provider.  Check with your health care provider before taking any new prescription or over-the-counter medicines.  Decide on a friend, family member, or smoking quit-line (such as 1-800-QUIT-NOW in the [place] that you can call or text when you feel the urge to smoke or when you need help coping with cravings.  Keep all follow-up visits as told by your health care provider and therapist. This is important.   Contact a health care provider if:  You are not able to take your medicines as prescribed.  Your symptoms get worse, even with treatment. Summary  Tobacco use disorder (TUD) occurs when a person craves, seeks, and uses tobacco regardless of the consequences.  This condition may be diagnosed based on your current and past tobacco use and a physical exam.  Many people are unable to quit on their own and need help. Recovery can be a long process.  The most effective treatment for TUD is usually a combination of medicine, talk therapy, and support groups. This information is not intended to replace advice given to you by your health care provider. Make sure you discuss any questions you have with your health care provider. Document Revised: 12/06/2016  Document Reviewed: 12/06/2016 Elsevier Patient Education  2021 Elsevier Inc.  Abdominal Bloating When you have abdominal bloating, your abdomen may feel full, tight, or painful. It may also look bigger than normal or swollen (distended). Common causes of abdominal bloating include:  Swallowing air.  Constipation.  Problems digesting food.  Eating too much.  Irritable bowel syndrome. This is a condition that affects the large intestine.  Lactose intolerance. This is an inability to digest lactose, a natural sugar in dairy products.  Celiac disease. This is a condition that affects the ability to digest gluten, a protein found in some grains.  Gastroparesis. This is a condition that slows down the movement of food in the stomach and small intestine. It is more common in people with diabetes mellitus.  Gastroesophageal reflux disease (GERD). This is a digestive condition that makes stomach acid flow back into the esophagus.  Urinary retention. This means that the body is holding onto urine, and the bladder cannot be emptied all the way. Follow these instructions at home: Eating and drinking  Avoid eating too much.  Try not to swallow air while talking or eating.  Avoid eating while lying down.  Avoid these foods and drinks: ? Foods that cause gas, such as broccoli, cabbage, cauliflower, and baked beans. ? Carbonated drinks. ? Hard candy. ? Chewing gum. Medicines  Take over-the-counter and prescription  medicines only as told by your health care provider.  Take probiotic medicines. These medicines contain live bacteria or yeasts that can help digestion.  Take coated peppermint oil capsules. Activity  Try to exercise regularly. Exercise may help to relieve bloating that is caused by gas and relieve constipation. General instructions  Keep all follow-up visits as told by your health care provider. This is important. Contact a health care provider if:  You have nausea  and vomiting.  You have diarrhea.  You have abdominal pain.  You have unusual weight loss or weight gain.  You have severe pain, and medicines do not help. Get help right away if:  You have severe chest pain.  You have trouble breathing.  You have shortness of breath.  You have trouble urinating.  You have darker urine than normal.  You have blood in your stools or have dark, tarry stools. Summary  Abdominal bloating means that the abdomen is swollen.  Common causes of abdominal bloating are swallowing air, constipation, and problems digesting food.  Avoid eating too much and avoid swallowing air.  Avoid foods that cause gas, carbonated drinks, hard candy, and chewing gum. This information is not intended to replace advice given to you by your health care provider. Make sure you discuss any questions you have with your health care provider. Document Revised: 04/08/2018 Document Reviewed: 01/21/2016 Elsevier Patient Education  2021 [employer].

## 2020-03-30 NOTE — Assessment & Plan Note (Signed)
Moderate generalized anxiety disorder with associated depression  We will continue Paxil at 20 mg daily  Referral to psychiatry made

## 2020-03-30 NOTE — Assessment & Plan Note (Signed)
  .   Current smoking consumption amount: 1 cartridge vaping a day  . Dicsussion on advise to quit smoking and smoking impacts: Cardiovascular lung impacts . Note recent data showing 1 cannot get off nicotine successfully using a vaping cessation method  . Patient's willingness to quit: Wanting to quit  . Methods to quit smoking discussed: Behavioral modification  . Medication management of smoking session drugs discussed: Not indicated  . Resources provided:  AVS   . Setting quit date not established  . Follow-up arranged 4 months   Time spent counseling the patient: 5 minutes

## 2020-03-30 NOTE — Assessment & Plan Note (Signed)
Eczema in both upper arms we will continue triamcinolone ointment and also recommended good skin moisturizer

## 2020-03-31 ENCOUNTER — Telehealth: Payer: Self-pay

## 2020-03-31 LAB — CBC WITH DIFFERENTIAL/PLATELET
Basophils Absolute: 0.1 10*3/uL (ref 0.0–0.2)
Basos: 1 %
EOS (ABSOLUTE): 0.5 10*3/uL — ABNORMAL HIGH (ref 0.0–0.4)
Eos: 5 %
Hematocrit: 40 % (ref 34.0–46.6)
Hemoglobin: 13.4 g/dL (ref 11.1–15.9)
Immature Grans (Abs): 0 10*3/uL (ref 0.0–0.1)
Immature Granulocytes: 0 %
Lymphocytes Absolute: 2.5 10*3/uL (ref 0.7–3.1)
Lymphs: 30 %
MCH: 31.9 pg (ref 26.6–33.0)
MCHC: 33.5 g/dL (ref 31.5–35.7)
MCV: 95 fL (ref 79–97)
Monocytes Absolute: 0.5 10*3/uL (ref 0.1–0.9)
Monocytes: 6 %
Neutrophils Absolute: 4.8 10*3/uL (ref 1.4–7.0)
Neutrophils: 58 %
Platelets: 379 10*3/uL (ref 150–450)
RBC: 4.2 x10E6/uL (ref 3.77–5.28)
RDW: 11.8 % (ref 11.7–15.4)
WBC: 8.4 10*3/uL (ref 3.4–10.8)

## 2020-03-31 LAB — COMPREHENSIVE METABOLIC PANEL
ALT: 14 IU/L (ref 0–32)
AST: 16 IU/L (ref 0–40)
Albumin/Globulin Ratio: 2.2 (ref 1.2–2.2)
Albumin: 4.3 g/dL (ref 3.8–4.8)
Alkaline Phosphatase: 53 IU/L (ref 44–121)
BUN/Creatinine Ratio: 17 (ref 9–23)
BUN: 14 mg/dL (ref 6–20)
Bilirubin Total: 0.3 mg/dL (ref 0.0–1.2)
CO2: 23 mmol/L (ref 20–29)
Calcium: 9 mg/dL (ref 8.7–10.2)
Chloride: 100 mmol/L (ref 96–106)
Creatinine, Ser: 0.83 mg/dL (ref 0.57–1.00)
Globulin, Total: 2 g/dL (ref 1.5–4.5)
Glucose: 92 mg/dL (ref 65–99)
Potassium: 4.4 mmol/L (ref 3.5–5.2)
Sodium: 138 mmol/L (ref 134–144)
Total Protein: 6.3 g/dL (ref 6.0–8.5)
eGFR: 95 mL/min/{1.73_m2} (ref >59)

## 2020-03-31 LAB — HCV AB W REFLEX TO QUANT PCR: HCV Ab: <0.1 s/co ratio (ref 0.0–0.9)

## 2020-03-31 LAB — THYROID PANEL WITH TSH
Free Thyroxine Index: 1.4 (ref 1.2–4.9)
T3 Uptake Ratio: 21 % — ABNORMAL LOW (ref 24–39)
T4, Total: 6.6 ug/dL (ref 4.5–12.0)
TSH: 2.97 u[IU]/mL (ref 0.450–4.500)

## 2020-03-31 LAB — HCV INTERPRETATION

## 2020-03-31 NOTE — Telephone Encounter (Signed)
-----   Message from Patrick E Wright, MD sent at 03/31/2020  8:31 AM EDT ----- Let patient know hep C neg,  kidney liver normal  blood counts normal,  thyroid normal 

## 2020-03-31 NOTE — Telephone Encounter (Signed)
Attempted to call pt for second time no answer.  

## 2020-03-31 NOTE — Telephone Encounter (Signed)
Attempted to call pt no answer no VM setup to leave message. Will try to call pt back later.

## 2020-03-31 NOTE — Telephone Encounter (Signed)
-----   Message from Storm Frisk, MD sent at 03/31/2020  8:31 AM EDT ----- Let patient know hep C neg,  kidney liver normal  blood counts normal,  thyroid normal

## 2020-04-01 NOTE — Telephone Encounter (Signed)
Spoke with pt regarding lab results. Pt verbalized understanding and voiced no other concerns.

## 2020-04-01 NOTE — Telephone Encounter (Signed)
-----   Message from Patrick E Wright, MD sent at 03/31/2020  8:31 AM EDT ----- Let patient know hep C neg,  kidney liver normal  blood counts normal,  thyroid normal 

## 2020-04-01 NOTE — Telephone Encounter (Signed)
Attempted to call pt again today no answer will try again.

## 2020-04-15 ENCOUNTER — Telehealth: Payer: Self-pay | Admitting: Critical Care Medicine

## 2020-04-15 DIAGNOSIS — F411 Generalized anxiety disorder: Secondary | ICD-10-CM

## 2020-04-15 NOTE — Telephone Encounter (Signed)
Will forward to provider  

## 2020-04-15 NOTE — Telephone Encounter (Signed)
Copied from CRM [id number]. Topic: Referral - Request for Referral >> Apr 15, 2020 12:28 PM Gwenlyn Fudge wrote: Has patient seen PCP for this complaint? Yes.   *If NO, is insurance requiring patient see PCP for this issue before PCP can refer them? Referral for which specialty: Psychiatrist  Preferred provider/office: N/A Reason for referral: Pt states that she was told she would need to be referred out to follow up with anxiety. Please advise.

## 2020-04-17 NOTE — Telephone Encounter (Signed)
Referral sent to psychiatry

## 2020-05-06 ENCOUNTER — Other Ambulatory Visit: Payer: Self-pay

## 2020-05-06 MED FILL — Paroxetine HCl Tab 20 MG: ORAL | 30 days supply | Qty: 30 | Fill #0 | Status: AC

## 2020-05-06 MED FILL — Albuterol Sulfate Inhal Aero 108 MCG/ACT (90MCG Base Equiv): RESPIRATORY_TRACT | 25 days supply | Qty: 18 | Fill #0 | Status: AC

## 2020-05-06 MED FILL — Triamcinolone Acetonide Oint 0.5%: CUTANEOUS | 15 days supply | Qty: 30 | Fill #0 | Status: AC

## 2020-05-06 MED FILL — Hydroxyzine HCl Tab 25 MG: ORAL | 20 days supply | Qty: 60 | Fill #0 | Status: AC

## 2020-05-06 MED FILL — Cetirizine HCl Tab 10 MG: ORAL | 30 days supply | Qty: 30 | Fill #0 | Status: AC

## 2020-06-10 ENCOUNTER — Ambulatory Visit (INDEPENDENT_AMBULATORY_CARE_PROVIDER_SITE_OTHER): Payer: Medicaid Other | Admitting: Obstetrics and Gynecology

## 2020-06-10 ENCOUNTER — Encounter: Payer: Self-pay | Admitting: Obstetrics and Gynecology

## 2020-06-10 ENCOUNTER — Other Ambulatory Visit: Payer: Self-pay

## 2020-06-10 ENCOUNTER — Other Ambulatory Visit (HOSPITAL_COMMUNITY)
Admission: RE | Admit: 2020-06-10 | Discharge: 2020-06-10 | Disposition: A | Discharge status: Home / Self Care | Payer: Medicaid Other | Source: Ambulatory Visit | Attending: Obstetrics and Gynecology | Admitting: Obstetrics and Gynecology

## 2020-06-10 VITALS — BP 130/83 | HR 95 | Ht 60.0 in | Wt 197.4 lb

## 2020-06-10 DIAGNOSIS — Z113 Encounter for screening for infections with a predominantly sexual mode of transmission: Secondary | ICD-10-CM | POA: Insufficient documentation

## 2020-06-10 DIAGNOSIS — Z01419 Encounter for gynecological examination (general) (routine) without abnormal findings: Secondary | ICD-10-CM | POA: Diagnosis present

## 2020-06-10 DIAGNOSIS — Z124 Encounter for screening for malignant neoplasm of cervix: Secondary | ICD-10-CM

## 2020-06-10 NOTE — Progress Notes (Signed)
GYNECOLOGY ANNUAL PREVENTATIVE CARE ENCOUNTER NOTE  History:     [name] [name] is a 35 y.o. G17P1102 female here for a routine annual gynecologic exam.  Current complaints: Small lesion on right vulva.   Denies abnormal vaginal bleeding, discharge, pelvic pain, problems with intercourse or other gynecologic concerns.    Gynecologic History Patient's last menstrual period was 05/16/2020 (approximate). Contraception: tubal ligation Last Pap: approx 7 years ago. No recent results seen in the system  Obstetric History OB History  Gravida Para Term Preterm AB Living  2 2 1 1   2   SAB IAB Ectopic Multiple Live Births          2    # Outcome Date GA Lbr Len/2nd Weight Sex Delivery Anes PTL Lv  2 Term 05/09/13 [redacted]w[redacted]d 04:20 / 00:16 4 lb 14.7 oz (2.23 kg) F Vag-Spont EPI  LIV  1 Preterm 01/23/11 [redacted]w[redacted]d 17:13 / 00:25 5 lb 11 oz (2.58 kg) F Vag-Spont EPI  LIV     Birth Comments: None    Past Medical History:  Diagnosis Date   Admission for sterilization 06/12/2013   Asthma    Breast abscess    Depression    HX of - not on med n, doing well.   Eczema    Family history of anesthesia complication    Grandmother has difficulty waskin up.   Pneumonia    HX only -years ago has had a few times.   Pregnant state, incidental    S/P tubal ligation 06/13/2013   SVD (spontaneous vaginal delivery)    x 2    Past Surgical History:  Procedure Laterality Date   INCISE AND DRAIN ABCESS     left breast x 2   INCISION AND DRAINAGE ABSCESS Left 02/17/2012   Procedure:  DRAINAGEof recurrent breast abscess;  Surgeon: 02/19/2012, MD;  Location: [facility];  Service: General;  Laterality: Left;   LAPAROSCOPIC TUBAL LIGATION Bilateral 06/13/2013   Procedure: LAPAROSCOPIC TUBAL LIGATION;  Surgeon: 08/13/2013, MD;  Location: [facility];  Service: Gynecology;  Laterality: Bilateral;   TUBAL LIGATION      Current Outpatient Medications on File Prior to Visit  Medication Sig Dispense  Refill   albuterol (VENTOLIN HFA) 108 (90 Base) MCG/ACT inhaler INHALE 2 PUFFS INTO THE LUNGS EVERY 6 (SIX) HOURS AS NEEDED FOR WHEEZING OR SHORTNESS OF BREATH. 18 g 1   cetirizine (ZYRTEC) 10 MG tablet TAKE 1 TABLET (10 MG TOTAL) BY MOUTH AT BEDTIME. 30 tablet 4   hydrOXYzine (ATARAX/VISTARIL) 25 MG tablet TAKE 1 TABLET (25 MG TOTAL) BY MOUTH 3 (THREE) TIMES DAILY. FOR ITCHING 60 tablet 2   PARoxetine (PAXIL) 20 MG tablet TAKE 1 TABLET (20 MG TOTAL) BY MOUTH DAILY. 30 tablet 3   triamcinolone ointment (KENALOG) 0.5 % APPLY 1 APPLICATION TOPICALLY 2 (TWO) TIMES DAILY. 180 g 4   [DISCONTINUED] escitalopram (LEXAPRO) 10 MG tablet Take 1 tablet (10 mg total) by mouth daily. (Patient not taking: Reported on 12/01/2019) 30 tablet 1   [DISCONTINUED] famotidine (PEPCID) 20 MG tablet Take 1 tablet (20 mg total) by mouth 2 (two) times daily. 30 tablet 0   [DISCONTINUED] gabapentin (NEURONTIN) 100 MG capsule Take 1 capsule (100 mg total) by mouth 3 (three) times daily. 60 capsule 0   No current facility-administered medications on file prior to visit.    Allergies  Allergen Reactions   Doxycycline Photosensitivity    Social History:  reports that she quit smoking about 17  months ago. Her smoking use included cigarettes. She has a 5.00 pack-year smoking history. She has never used smokeless tobacco. She reports current alcohol use. She reports that she does not use drugs.  Family History  Problem Relation Age of Onset   Healthy Mother    Anxiety disorder Mother    Healthy Father    Stroke Maternal Grandmother    Stroke Paternal Grandfather    Bipolar disorder Neg Hx    Depression Neg Hx     The following portions of the patient's history were reviewed and updated as appropriate: allergies, current medications, past family history, past medical history, past social history, past surgical history and problem list.  Review of Systems Pertinent items noted in HPI and remainder of comprehensive ROS  otherwise negative.  Physical Exam:  BP 130/83   Pulse 95   Ht 5' (1.524 m)   Wt 197 lb 6.4 oz (89.5 kg)   LMP 05/16/2020 (Approximate)   BMI 38.55 kg/m  CONSTITUTIONAL: Well-developed, well-nourished female in no acute distress.  HENT:  Normocephalic, atraumatic, External right and left ear normal. Oropharynx is clear and moist EYES: Conjunctivae and EOM are normal.  NECK: Normal range of motion, supple, no masses.  Normal thyroid.  SKIN: Skin is warm and dry. No rash noted. Not diaphoretic. No erythema. No pallor. MUSCULOSKELETAL: Normal range of motion. No tenderness.  No cyanosis, clubbing, or edema.  2+ distal pulses. NEUROLOGIC: Alert and oriented to person, place, and time. Normal reflexes, muscle tone coordination.  PSYCHIATRIC: Normal mood and affect. Normal behavior. Normal judgment and thought content. CARDIOVASCULAR: Normal heart rate noted, regular rhythm RESPIRATORY: Clear to auscultation bilaterally. Effort and breath sounds normal, no problems with respiration noted. BREASTS: Symmetric in size. No masses, tenderness, skin changes, nipple drainage, or lymphadenopathy bilaterally. Scarring from previous I and D seen on left breast.  Performed in the presence of a chaperone. ABDOMEN: Soft, no distention noted.  No tenderness, rebound or guarding.  PELVIC: Normal appearing external genitalia and urethral meatus;  Small follicular cyst seen on right vulva.  No erythema or drainage. normal appearing vaginal mucosa and cervix.  No abnormal discharge noted.  Pap smear obtained.  Normal uterine size, no other palpable masses, no uterine or adnexal tenderness.  Performed in the presence of a chaperone.   Assessment and Plan:    1. Screening for cervical cancer  - Cytology - PAP( [facility])  2. Screening for STDs (sexually transmitted diseases) Per pt request - Hepatitis B Surface AntiGEN - Hepatitis C Antibody - RPR - HIV Antibody (routine testing w rflx) - Cervicovaginal  ancillary only  3. Women's annual routine gynecological examination Pap taken - Cytology - PAP( [facility])  4. Screening examination for STD (sexually transmitted disease)   Will follow up results of pap smear and manage accordingly. Routine preventative health maintenance measures emphasized. Please refer to After Visit Summary for other counseling recommendations.    F/U in 1 year for next annual exam.  Mariel Aloe, MD, FACOG Obstetrician & Gynecologist, [facility] for [facility], [facility]

## 2020-06-11 LAB — HIV ANTIBODY (ROUTINE TESTING W REFLEX): HIV Screen 4th Generation wRfx: NONREACTIVE

## 2020-06-11 LAB — HEPATITIS C ANTIBODY: Hep C Virus Ab: <0.1 s/co ratio (ref 0.0–0.9)

## 2020-06-11 LAB — CERVICOVAGINAL ANCILLARY ONLY
Chlamydia: NEGATIVE
Comment: NEGATIVE
Comment: NEGATIVE
Comment: NORMAL
Neisseria Gonorrhea: NEGATIVE
Trichomonas: NEGATIVE

## 2020-06-11 LAB — RPR: RPR Ser Ql: NONREACTIVE

## 2020-06-11 LAB — CYTOLOGY - PAP
Comment: NEGATIVE
Diagnosis: NEGATIVE
High risk HPV: NEGATIVE

## 2020-06-11 LAB — HEPATITIS B SURFACE ANTIGEN: Hepatitis B Surface Ag: NEGATIVE

## 2020-06-14 ENCOUNTER — Other Ambulatory Visit: Payer: Self-pay | Admitting: Critical Care Medicine

## 2020-06-14 DIAGNOSIS — J4540 Moderate persistent asthma, uncomplicated: Secondary | ICD-10-CM

## 2020-06-14 NOTE — Telephone Encounter (Signed)
Medication: Rx #: [id number] albuterol (VENTOLIN HFA) 108 (90 Base) MCG/ACT inhaler [[id number]]    Has the patient contacted their pharmacy? YES  (Agent: If no, request that the patient contact the pharmacy for the refill.) (Agent: If yes, when and what did the pharmacy advise?)  Preferred Pharmacy (with phone number or street name): [facility] [address] [place] [address] Phone: [phone] Fax: [phone] Hours: M-F 8:30a-5:30p    Agent: Please be advised that RX refills may take up to 3 business days. We ask that you follow-up with your pharmacy.

## 2020-06-14 NOTE — Telephone Encounter (Signed)
Requested medication (s) are due for refill today: no  Requested medication (s) are on the active medication list:yes    Future visit scheduled: no   Notes to clinic:One inhaler should last at least one month. If the patient is requesting refills earlier, contact the patient to check for uncontrolled symptoms Last filled 03/30/2020 with 18g and 1 refill   Requested Prescriptions  Pending Prescriptions Disp Refills   albuterol (VENTOLIN HFA) 108 (90 Base) MCG/ACT inhaler 18 g 1    Sig: INHALE 2 PUFFS INTO THE LUNGS EVERY 6 (SIX) HOURS AS NEEDED FOR WHEEZING OR SHORTNESS OF BREATH.      Pulmonology:  Beta Agonists Failed - 06/14/2020  3:23 PM      Failed - One inhaler should last at least one month. If the patient is requesting refills earlier, contact the patient to check for uncontrolled symptoms.      Passed - Valid encounter within last 12 months    Recent Outpatient Visits           2 months ago GAD (generalized anxiety disorder)   [facility] Storm Frisk, MD   5 months ago GAD (generalized anxiety disorder)   [facility] [place], [place], [place]   6 months ago GAD (generalized anxiety disorder)   [facility] [place], [place], NP   7 months ago Eczema, unspecified type   [facility] [place], [place], [place]   10 months ago Moderate persistent asthma without complication   [facility] [place], [place], [place]

## 2020-06-15 ENCOUNTER — Other Ambulatory Visit: Payer: Self-pay

## 2020-06-15 MED ORDER — ALBUTEROL SULFATE HFA 108 (90 BASE) MCG/ACT IN AERS
2.0000 | INHALATION_SPRAY | Freq: Four times a day (QID) | RESPIRATORY_TRACT | 0 refills | Status: DC | PRN
  Filled 2020-06-15 – 2020-08-16 (×2): qty 18, 25d supply, fill #0

## 2020-06-15 MED FILL — Albuterol Sulfate Inhal Aero 108 MCG/ACT (90MCG Base Equiv): RESPIRATORY_TRACT | 25 days supply | Qty: 18 | Fill #1 | Status: AC

## 2020-06-29 ENCOUNTER — Ambulatory Visit: Payer: Medicaid Other | Admitting: Critical Care Medicine

## 2020-07-06 ENCOUNTER — Other Ambulatory Visit: Payer: Self-pay

## 2020-07-06 MED FILL — Paroxetine HCl Tab 20 MG: ORAL | 30 days supply | Qty: 30 | Fill #1 | Status: AC

## 2020-08-16 ENCOUNTER — Other Ambulatory Visit: Payer: Self-pay

## 2020-08-16 MED FILL — Paroxetine HCl Tab 20 MG: ORAL | 30 days supply | Qty: 30 | Fill #2 | Status: AC

## 2020-09-01 ENCOUNTER — Other Ambulatory Visit: Payer: Self-pay | Admitting: Critical Care Medicine

## 2020-09-01 ENCOUNTER — Other Ambulatory Visit: Payer: Self-pay

## 2020-09-01 DIAGNOSIS — J4540 Moderate persistent asthma, uncomplicated: Secondary | ICD-10-CM

## 2020-09-01 MED ORDER — ALBUTEROL SULFATE HFA 108 (90 BASE) MCG/ACT IN AERS
2.0000 | INHALATION_SPRAY | Freq: Four times a day (QID) | RESPIRATORY_TRACT | 2 refills | Status: DC | PRN
  Filled 2020-09-01: qty 18, 25d supply, fill #0
  Filled 2020-09-24: qty 18, 25d supply, fill #1
  Filled 2020-10-15: qty 18, 25d supply, fill #2

## 2020-09-02 ENCOUNTER — Other Ambulatory Visit: Payer: Self-pay

## 2020-09-16 ENCOUNTER — Encounter: Payer: Self-pay | Admitting: Emergency Medicine

## 2020-09-16 ENCOUNTER — Ambulatory Visit
Admission: EM | Admit: 2020-09-16 | Discharge: 2020-09-16 | Disposition: A | Discharge status: Home / Self Care | Payer: Medicaid Other | Attending: Internal Medicine | Admitting: Internal Medicine

## 2020-09-16 DIAGNOSIS — S335XXA Sprain of ligaments of lumbar spine, initial encounter: Secondary | ICD-10-CM

## 2020-09-16 DIAGNOSIS — M545 Low back pain, unspecified: Secondary | ICD-10-CM

## 2020-09-16 MED ORDER — PREDNISONE 20 MG PO TABS: 40.0000 mg | ORAL_TABLET | Freq: Every day | ORAL | 0 refills | Status: AC

## 2020-09-16 MED ORDER — KETOROLAC TROMETHAMINE 60 MG/2ML IM SOLN
30.0000 mg | Freq: Once | INTRAMUSCULAR | Status: AC
  Administered 2020-09-16: 30 mg via INTRAMUSCULAR

## 2020-09-16 NOTE — Discharge Instructions (Addendum)
You were given ketorolac injection today in urgent care.  You were also prescribed prednisone steroid to help with inflammation in the back.  Please do not start taking prednisone until 24 hours following ketorolac injection.  Please avoid taking ibuprofen while on prednisone as well.  You may take Tylenol.  Please also alternate ice and heat.

## 2020-09-16 NOTE — ED Triage Notes (Signed)
Patient c/o low back pain for a couple of weeks.  No injury.  Hurts to walk.  Patient has been taken Ibuprofen.

## 2020-09-16 NOTE — ED Provider Notes (Signed)
[facility]    CSN: [id number] Arrival date & time: 09/16/20  1513      History   Chief Complaint Chief Complaint  Patient presents with   Back Pain    HPI [name] [name] is a 35 y.o. female.   Patient presents with left-sided lower back pain that has been present for multiple weeks.  Denies any apparent injury but states that she may have fallen down a few weeks ago after a night out with her friends, but does not remember if this occurred.  Pain is elicited by movement.  Denies any urinary burning, frequency,hematuria, vaginal discharge, fever, abdominal pain.  Has taken ibuprofen without relief of pain.  Denies any numbness or tingling and pain does not radiate down either one of her legs.   Back Pain  Past Medical History:  Diagnosis Date   Admission for sterilization 06/12/2013   Asthma    Breast abscess    Depression    HX of - not on med n, doing well.   Eczema    Family history of anesthesia complication    Grandmother has difficulty waskin up.   Pneumonia    HX only -years ago has had a few times.   Pregnant state, incidental    S/P tubal ligation 06/13/2013   SVD (spontaneous vaginal delivery)    x 2    Patient Active Problem List   Diagnosis Date Noted   Women's annual routine gynecological examination 06/10/2020   Screening examination for STD (sexually transmitted disease) 06/10/2020   Tobacco use disorder 12/07/2014   Major depressive disorder, recurrent episode, moderate (HCC) 01/13/2014   GAD (generalized anxiety disorder) 01/13/2014   S/P tubal ligation 06/13/2013   Allergic conjunctivitis of both eyes and rhinitis 10/14/2012   Asthma 03/01/2006   Eczema 03/01/2006    Past Surgical History:  Procedure Laterality Date   INCISE AND DRAIN ABCESS     left breast x 2   INCISION AND DRAINAGE ABSCESS Left 02/17/2012   Procedure:  DRAINAGEof recurrent breast abscess;  Surgeon: Shelly Rubenstein, MD;  Location: [facility];  Service:  General;  Laterality: Left;   LAPAROSCOPIC TUBAL LIGATION Bilateral 06/13/2013   Procedure: LAPAROSCOPIC TUBAL LIGATION;  Surgeon: Sherian Rein, MD;  Location: [facility];  Service: Gynecology;  Laterality: Bilateral;   TUBAL LIGATION      OB History     Gravida  2   Para  2   Term  1   Preterm  1   AB      Living  2      SAB      IAB      Ectopic      Multiple      Live Births  2            Home Medications    Prior to Admission medications   Medication Sig Start Date End Date Taking? Authorizing Provider  albuterol (VENTOLIN HFA) 108 (90 Base) MCG/ACT inhaler Inhale 2 puffs into the lungs every 6 (six) hours as needed for wheezing or shortness of breath. 09/01/20 09/01/21 Yes Storm Frisk, MD  cetirizine (ZYRTEC) 10 MG tablet TAKE 1 TABLET (10 MG TOTAL) BY MOUTH AT BEDTIME. 03/30/20 03/30/21 Yes Storm Frisk, MD  hydrOXYzine (ATARAX/VISTARIL) 25 MG tablet TAKE 1 TABLET (25 MG TOTAL) BY MOUTH 3 (THREE) TIMES DAILY. FOR ITCHING 03/30/20 03/30/21 Yes Storm Frisk, MD  PARoxetine (PAXIL) 20 MG tablet TAKE 1 TABLET (20 MG TOTAL) BY  MOUTH DAILY. 03/30/20 03/30/21 Yes Storm Frisk, MD  predniSONE (DELTASONE) 20 MG tablet Take 2 tablets (40 mg total) by mouth daily for 5 days. 09/16/20 09/21/20 Yes Lance Muss, FNP  triamcinolone ointment (KENALOG) 0.5 % APPLY 1 APPLICATION TOPICALLY 2 (TWO) TIMES DAILY. 03/30/20 03/30/21 Yes Storm Frisk, MD  escitalopram (LEXAPRO) 10 MG tablet Take 1 tablet (10 mg total) by mouth daily. Patient not taking: Reported on 12/01/2019 11/12/19 12/01/19  Cain Saupe, MD  famotidine (PEPCID) 20 MG tablet Take 1 tablet (20 mg total) by mouth 2 (two) times daily. 01/03/18 06/26/19  Michela Pitcher A, PA-C  gabapentin (NEURONTIN) 100 MG capsule Take 1 capsule (100 mg total) by mouth 3 (three) times daily. 11/21/18 06/26/19  Fayrene Helper, PA-C    Family History Family History  Problem Relation Age of Onset   Healthy Mother     Anxiety disorder Mother    Healthy Father    Stroke Maternal Grandmother    Stroke Paternal Grandfather    Bipolar disorder Neg Hx    Depression Neg Hx     Social History Social History   Tobacco Use   Smoking status: Former    Packs/day: 1.00    Years: 5.00    Pack years: 5.00    Types: Cigarettes    Quit date: 2021    Years since quitting: 1.7   Smokeless tobacco: Never  Vaping Use   Vaping Use: Every day  Substance Use Topics   Alcohol use: Yes    Comment: occasional   Drug use: No     Allergies   Doxycycline   Review of Systems Review of Systems Per HPI  Physical Exam Triage Vital Signs ED Triage Vitals  Enc Vitals Group     BP 09/16/20 1600 136/78     Pulse Rate 09/16/20 1600 94     Resp 09/16/20 1600 18     Temp 09/16/20 1600 98.6 F (37 C)     Temp Source 09/16/20 1600 Oral     SpO2 09/16/20 1600 99 %     Weight 09/16/20 1602 200 lb (90.7 kg)     Height 09/16/20 1602 5' (1.524 m)     Head Circumference --      Peak Flow --      Pain Score 09/16/20 1602 7     Pain Loc --      Pain Edu? --      Excl. in GC? --    No data found.  Updated Vital Signs BP 136/78 (BP Location: Left Arm)   Pulse 94   Temp 98.6 F (37 C) (Oral)   Resp 18   Ht 5' (1.524 m)   Wt 200 lb (90.7 kg)   LMP 08/18/2020   SpO2 99%   BMI 39.06 kg/m   Visual Acuity Right Eye Distance:   Left Eye Distance:   Bilateral Distance:    Right Eye Near:   Left Eye Near:    Bilateral Near:     Physical Exam Constitutional:      Appearance: Normal appearance.  HENT:     Head: Normocephalic and atraumatic.  Eyes:     Extraocular Movements: Extraocular movements intact.     Conjunctiva/sclera: Conjunctivae normal.  Pulmonary:     Effort: Pulmonary effort is normal.  Abdominal:     General: Abdomen is flat. Bowel sounds are normal. There is no distension.     Palpations: Abdomen is soft.     Tenderness: There  is no abdominal tenderness.  Musculoskeletal:      Cervical back: Normal.     Thoracic back: Normal.     Lumbar back: Tenderness present. No bony tenderness. Negative right straight leg raise test and negative left straight leg raise test.     Comments: Tenderness to palpation to left lower lumbar region.  Neurological:     General: No focal deficit present.     Mental Status: She is alert and oriented to person, place, and time. Mental status is at baseline.     Deep Tendon Reflexes: Reflexes are normal and symmetric.  Psychiatric:        Mood and Affect: Mood normal.        Behavior: Behavior normal.        Thought Content: Thought content normal.        Judgment: Judgment normal.     UC Treatments / Results  Labs (all labs ordered are listed, but only abnormal results are displayed) Labs Reviewed - No data to display  EKG   Radiology No results found.  Procedures Procedures (including critical care time)  Medications Ordered in UC Medications  ketorolac (TORADOL) injection 30 mg (30 mg Intramuscular Given 09/16/20 1633)    Initial Impression / Assessment and Plan / UC Course  I have reviewed the triage vital signs and the nursing notes.  Pertinent labs & imaging results that were available during my care of the patient were reviewed by me and considered in my medical decision making (see chart for details).     Ketorolac injection administered in urgent care.  Prednisone steroid prescribed to decrease inflammation in back.  Advised patient to start taking prednisone 24 hours following ketorolac injection and to avoid NSAIDs while taking prednisone.  Patient may take Tylenol.  Patient to alternate ice and heat application as well.  Low suspicion for urinary cause of back pain.  Advised patient to follow-up if pain persists.Discussed strict return precautions. Patient verbalized understanding and is agreeable with plan.  Final Clinical Impressions(s) / UC Diagnoses   Final diagnoses:  Lumbar sprain, initial encounter   Acute left-sided low back pain without sciatica     Discharge Instructions      You were given ketorolac injection today in urgent care.  You were also prescribed prednisone steroid to help with inflammation in the back.  Please do not start taking prednisone until 24 hours following ketorolac injection.  Please avoid taking ibuprofen while on prednisone as well.  You may take Tylenol.  Please also alternate ice and heat.     ED Prescriptions     Medication Sig Dispense Auth. Provider   predniSONE (DELTASONE) 20 MG tablet Take 2 tablets (40 mg total) by mouth daily for 5 days. 10 tablet Lance Muss, FNP      PDMP not reviewed this encounter.   Lance Muss, FNP 09/16/20 [phone]

## 2020-09-24 ENCOUNTER — Other Ambulatory Visit: Payer: Self-pay

## 2020-09-24 MED FILL — Paroxetine HCl Tab 20 MG: ORAL | 30 days supply | Qty: 30 | Fill #3 | Status: AC

## 2020-09-24 MED FILL — Hydroxyzine HCl Tab 25 MG: ORAL | 20 days supply | Qty: 60 | Fill #1 | Status: AC

## 2020-10-15 ENCOUNTER — Ambulatory Visit
Admission: EM | Admit: 2020-10-15 | Discharge: 2020-10-15 | Disposition: A | Discharge status: Home / Self Care | Payer: Medicaid Other | Attending: Physician Assistant | Admitting: Physician Assistant

## 2020-10-15 ENCOUNTER — Ambulatory Visit (INDEPENDENT_AMBULATORY_CARE_PROVIDER_SITE_OTHER): Payer: Self-pay

## 2020-10-15 ENCOUNTER — Other Ambulatory Visit: Payer: Self-pay

## 2020-10-15 DIAGNOSIS — M79671 Pain in right foot: Secondary | ICD-10-CM

## 2020-10-15 MED ORDER — PREDNISONE 20 MG PO TABS: 40.0000 mg | ORAL_TABLET | Freq: Every day | ORAL | 0 refills | Status: AC

## 2020-10-15 NOTE — ED Triage Notes (Signed)
Two weeks h/o intermittent right plantar surface foot pain. Notes tenderness to the touch and pain with ambulation. Has been taking OTC pain meds without relief. No falls or injuries. Denies swelling and bruising.

## 2020-10-15 NOTE — ED Provider Notes (Signed)
[facility]    CSN: [id number] Arrival date & time: 10/15/20  1515      History   Chief Complaint Chief Complaint  Patient presents with   Foot Pain    right    HPI [name] [name] is a 35 y.o. female.   Patient here today for evaluation of pain in her right forefoot that started about 2 weeks ago but worsened significantly yesterday.  She states she is not aware of any injury, and is not sure if she dropped something on her foot but reports weightbearing has been very painful.  She thinks she has also noticed some bruising to the dorsal surface of her forefoot.  She denies any numbness but does endorse some tingling today.  She has tried ibuprofen with minimal relief.   Foot Pain   Past Medical History:  Diagnosis Date   Admission for sterilization 06/12/2013   Asthma    Breast abscess    Depression    HX of - not on med n, doing well.   Eczema    Family history of anesthesia complication    Grandmother has difficulty waskin up.   Pneumonia    HX only -years ago has had a few times.   Pregnant state, incidental    S/P tubal ligation 06/13/2013   SVD (spontaneous vaginal delivery)    x 2    Patient Active Problem List   Diagnosis Date Noted   Women's annual routine gynecological examination 06/10/2020   Screening examination for STD (sexually transmitted disease) 06/10/2020   Tobacco use disorder 12/07/2014   Major depressive disorder, recurrent episode, moderate (HCC) 01/13/2014   GAD (generalized anxiety disorder) 01/13/2014   S/P tubal ligation 06/13/2013   Allergic conjunctivitis of both eyes and rhinitis 10/14/2012   Asthma 03/01/2006   Eczema 03/01/2006    Past Surgical History:  Procedure Laterality Date   INCISE AND DRAIN ABCESS     left breast x 2   INCISION AND DRAINAGE ABSCESS Left 02/17/2012   Procedure:  DRAINAGEof recurrent breast abscess;  Surgeon: Shelly Rubenstein, MD;  Location: [facility];  Service: General;  Laterality:  Left;   LAPAROSCOPIC TUBAL LIGATION Bilateral 06/13/2013   Procedure: LAPAROSCOPIC TUBAL LIGATION;  Surgeon: Sherian Rein, MD;  Location: [facility];  Service: Gynecology;  Laterality: Bilateral;   TUBAL LIGATION      OB History     Gravida  2   Para  2   Term  1   Preterm  1   AB      Living  2      SAB      IAB      Ectopic      Multiple      Live Births  2            Home Medications    Prior to Admission medications   Medication Sig Start Date End Date Taking? Authorizing Provider  predniSONE (DELTASONE) 20 MG tablet Take 2 tablets (40 mg total) by mouth daily with breakfast for 5 days. 10/15/20 10/20/20 Yes Tomi Bamberger, PA-C  albuterol (VENTOLIN HFA) 108 (90 Base) MCG/ACT inhaler Inhale 2 puffs into the lungs every 6 (six) hours as needed for wheezing or shortness of breath. 09/01/20 09/01/21  Storm Frisk, MD  cetirizine (ZYRTEC) 10 MG tablet TAKE 1 TABLET (10 MG TOTAL) BY MOUTH AT BEDTIME. 03/30/20 03/30/21  Storm Frisk, MD  hydrOXYzine (ATARAX/VISTARIL) 25 MG tablet TAKE 1 TABLET (25 MG  TOTAL) BY MOUTH 3 (THREE) TIMES DAILY. FOR ITCHING 03/30/20 03/30/21  Storm Frisk, MD  PARoxetine (PAXIL) 20 MG tablet TAKE 1 TABLET (20 MG TOTAL) BY MOUTH DAILY. 03/30/20 03/30/21  Storm Frisk, MD  triamcinolone ointment (KENALOG) 0.5 % APPLY 1 APPLICATION TOPICALLY 2 (TWO) TIMES DAILY. 03/30/20 03/30/21  Storm Frisk, MD  escitalopram (LEXAPRO) 10 MG tablet Take 1 tablet (10 mg total) by mouth daily. Patient not taking: Reported on 12/01/2019 11/12/19 12/01/19  Cain Saupe, MD  famotidine (PEPCID) 20 MG tablet Take 1 tablet (20 mg total) by mouth 2 (two) times daily. 01/03/18 06/26/19  Michela Pitcher A, PA-C  gabapentin (NEURONTIN) 100 MG capsule Take 1 capsule (100 mg total) by mouth 3 (three) times daily. 11/21/18 06/26/19  Fayrene Helper, PA-C    Family History Family History  Problem Relation Age of Onset   Healthy Mother    Anxiety disorder  Mother    Healthy Father    Stroke Maternal Grandmother    Stroke Paternal Grandfather    Bipolar disorder Neg Hx    Depression Neg Hx     Social History Social History   Tobacco Use   Smoking status: Former    Packs/day: 1.00    Years: 5.00    Pack years: 5.00    Types: Cigarettes    Quit date: 2021    Years since quitting: 1.7   Smokeless tobacco: Never  Vaping Use   Vaping Use: Every day  Substance Use Topics   Alcohol use: Yes    Comment: occasional   Drug use: No     Allergies   Doxycycline   Review of Systems Review of Systems  Constitutional:  Negative for fever.  Eyes:  Negative for discharge and redness.  Musculoskeletal:  Positive for arthralgias.  Skin:  Positive for color change.  Neurological:  Negative for numbness.    Physical Exam Triage Vital Signs ED Triage Vitals  Enc Vitals Group     BP 10/15/20 1523 (!) 145/88     Pulse Rate 10/15/20 1523 91     Resp 10/15/20 1523 18     Temp 10/15/20 1523 97.7 F (36.5 C)     Temp Source 10/15/20 1523 Oral     SpO2 10/15/20 1523 97 %     Weight --      Height --      Head Circumference --      Peak Flow --      Pain Score 10/15/20 1525 7     Pain Loc --      Pain Edu? --      Excl. in GC? --    No data found.  Updated Vital Signs BP (!) 145/88 (BP Location: Left Arm)   Pulse 91   Temp 97.7 F (36.5 C) (Oral)   Resp 18   SpO2 97%      Physical Exam Vitals and nursing note reviewed.  Constitutional:      General: She is not in acute distress.    Appearance: Normal appearance. She is not ill-appearing.  HENT:     Head: Normocephalic and atraumatic.  Eyes:     Conjunctiva/sclera: Conjunctivae normal.  Musculoskeletal:     Comments: Full ROM of right toes and ankle  Skin:    Comments: Minimal bruising to lateral right dorsal forefoot  at 3rd-5th distal metatarsals, TTP noted to same  Neurological:     Mental Status: She is alert.  Psychiatric:  Mood and Affect: Mood  normal.        Behavior: Behavior normal.     UC Treatments / Results  Labs (all labs ordered are listed, but only abnormal results are displayed) Labs Reviewed - No data to display  EKG   Radiology DG Foot Complete Right  Result Date: 10/15/2020 CLINICAL DATA:  Right foot pain when walking for 2 weeks. No specific injury. EXAM: RIGHT FOOT COMPLETE - 3+ VIEW COMPARISON:  None. FINDINGS: The mineralization and alignment are normal. There is no evidence of acute fracture or dislocation. The joint spaces are preserved. No erosive changes or focal soft tissue abnormalities are identified. IMPRESSION: Normal right foot radiographs. Electronically Signed   By: Carey Bullocks [name]   On: 10/15/2020 16:05    Procedures Procedures (including critical care time)  Medications Ordered in UC Medications - No data to display  Initial Impression / Assessment and Plan / UC Course  I have reviewed the triage vital signs and the nursing notes.  Pertinent labs & imaging results that were available during my care of the patient were reviewed by me and considered in my medical decision making (see chart for details).   Xray without fracture. Will treat with prednisone in hopes to improve pain, inflammation. Recommended follow up if no improvement or if symptoms worsen.   Final Clinical Impressions(s) / UC Diagnoses   Final diagnoses:  Right foot pain   Discharge Instructions   None    ED Prescriptions     Medication Sig Dispense Auth. Provider   predniSONE (DELTASONE) 20 MG tablet Take 2 tablets (40 mg total) by mouth daily with breakfast for 5 days. 10 tablet Tomi Bamberger, PA-C      PDMP not reviewed this encounter.   Tomi Bamberger, PA-C 10/15/20 1622

## 2020-11-07 NOTE — Progress Notes (Incomplete)
Established Patient Office Visit  Subjective:  Patient ID: [name] [name], female    DOB: 1985-06-04  Age: 35 y.o. MRN: [id number]  CC: No chief complaint on file.   HPI [name] [name] presents for  Flu pcv 20      Asthma Mild intermittent asthma plan for this patient will be continue ProAir as needed   Eczema Eczema in both upper arms we will continue triamcinolone ointment and also recommended good skin moisturizer   GAD (generalized anxiety disorder) Moderate generalized anxiety disorder with associated depression   We will continue Paxil at 20 mg daily Past Medical History:  Diagnosis Date   Admission for sterilization 06/12/2013   Asthma    Breast abscess    Depression    HX of - not on med n, doing well.   Eczema    Family history of anesthesia complication    Grandmother has difficulty waskin up.   Pneumonia    HX only -years ago has had a few times.   Pregnant state, incidental    S/P tubal ligation 06/13/2013   SVD (spontaneous vaginal delivery)    x 2    Past Surgical History:  Procedure Laterality Date   INCISE AND DRAIN ABCESS     left breast x 2   INCISION AND DRAINAGE ABSCESS Left 02/17/2012   Procedure:  DRAINAGEof recurrent breast abscess;  Surgeon: Harl Bowie, MD;  Location: [facility];  Service: General;  Laterality: Left;   LAPAROSCOPIC TUBAL LIGATION Bilateral 06/13/2013   Procedure: LAPAROSCOPIC TUBAL LIGATION;  Surgeon: Janyth Contes, MD;  Location: [facility];  Service: Gynecology;  Laterality: Bilateral;   TUBAL LIGATION      Family History  Problem Relation Age of Onset   Healthy Mother    Anxiety disorder Mother    Healthy Father    Stroke Maternal Grandmother    Stroke Paternal Grandfather    Bipolar disorder Neg Hx    Depression Neg Hx     Social History   Socioeconomic History   Marital status: Divorced    Spouse name: Not on file   Number of children: Not on file   Years of  education: Not on file   Highest education level: Not on file  Occupational History   Not on file  Tobacco Use   Smoking status: Former    Packs/day: 1.00    Years: 5.00    Pack years: 5.00    Types: Cigarettes    Quit date: 2021    Years since quitting: 1.8   Smokeless tobacco: Never  Vaping Use   Vaping Use: Every day  Substance and Sexual Activity   Alcohol use: Yes    Comment: occasional   Drug use: No   Sexual activity: Yes    Birth control/protection: Surgical  Other Topics Concern   Not on file  Social History Narrative   Lives with boyfriend [name] [name] and his son [name] [name] 2006   Not currently working    Social Determinants of Radio broadcast assistant Strain: Not on file  Food Insecurity: Food Insecurity Present   Worried About Charity fundraiser in the Last Year: Sometimes true   Arboriculturist in the Last Year: Sometimes true  Transportation Needs: No Transportation Needs   Lack of Transportation (Medical): No   Lack of Transportation (Non-Medical): No  Physical Activity: Not on file  Stress: Not on file  Social Connections: Not on file  Intimate Partner Violence: Not on  file    Outpatient Medications Prior to Visit  Medication Sig Dispense Refill   albuterol (VENTOLIN HFA) 108 (90 Base) MCG/ACT inhaler Inhale 2 puffs into the lungs every 6 (six) hours as needed for wheezing or shortness of breath. 18 g 2   cetirizine (ZYRTEC) 10 MG tablet TAKE 1 TABLET (10 MG TOTAL) BY MOUTH AT BEDTIME. 30 tablet 4   hydrOXYzine (ATARAX/VISTARIL) 25 MG tablet TAKE 1 TABLET (25 MG TOTAL) BY MOUTH 3 (THREE) TIMES DAILY. FOR ITCHING 60 tablet 2   PARoxetine (PAXIL) 20 MG tablet TAKE 1 TABLET (20 MG TOTAL) BY MOUTH DAILY. 30 tablet 3   triamcinolone ointment (KENALOG) 0.5 % APPLY 1 APPLICATION TOPICALLY 2 (TWO) TIMES DAILY. 180 g 4   No facility-administered medications prior to visit.    Allergies  Allergen Reactions   Doxycycline  Photosensitivity    ROS Review of Systems    Objective:    Physical Exam  There were no vitals taken for this visit. Wt Readings from Last 3 Encounters:  09/16/20 200 lb (90.7 kg)  06/10/20 197 lb 6.4 oz (89.5 kg)  03/30/20 198 lb 9.6 oz (90.1 kg)     Health Maintenance Due  Topic Date Due   Pneumococcal Vaccine 66-34 Years old (2 - PCV) 05/11/2014   INFLUENZA VACCINE  08/02/2020    There are no preventive care reminders to display for this patient.  Lab Results  Component Value Date   TSH 2.970 03/30/2020   Lab Results  Component Value Date   WBC 8.4 03/30/2020   HGB 13.4 03/30/2020   HCT 40.0 03/30/2020   MCV 95 03/30/2020   PLT 379 03/30/2020   Lab Results  Component Value Date   NA 138 03/30/2020   K 4.4 03/30/2020   CO2 23 03/30/2020   GLUCOSE 92 03/30/2020   BUN 14 03/30/2020   CREATININE 0.83 03/30/2020   BILITOT 0.3 03/30/2020   ALKPHOS 53 03/30/2020   AST 16 03/30/2020   ALT 14 03/30/2020   PROT 6.3 03/30/2020   ALBUMIN 4.3 03/30/2020   CALCIUM 9.0 03/30/2020   ANIONGAP 7 12/28/2017   EGFR 95 03/30/2020   No results found for: CHOL No results found for: HDL No results found for: LDLCALC No results found for: TRIG No results found for: CHOLHDL No results found for: HGBA1C    Assessment & Plan:   Problem List Items Addressed This Visit   None   No orders of the defined types were placed in this encounter.   Follow-up: No follow-ups on file.    Asencion Noble, MD

## 2020-11-08 ENCOUNTER — Ambulatory Visit: Payer: Medicaid Other | Admitting: Critical Care Medicine

## 2020-11-11 ENCOUNTER — Other Ambulatory Visit: Payer: Self-pay | Admitting: Critical Care Medicine

## 2020-11-11 ENCOUNTER — Other Ambulatory Visit: Payer: Self-pay

## 2020-11-11 DIAGNOSIS — F411 Generalized anxiety disorder: Secondary | ICD-10-CM

## 2020-11-11 DIAGNOSIS — J4540 Moderate persistent asthma, uncomplicated: Secondary | ICD-10-CM

## 2020-11-11 MED ORDER — ALBUTEROL SULFATE HFA 108 (90 BASE) MCG/ACT IN AERS
2.0000 | INHALATION_SPRAY | Freq: Four times a day (QID) | RESPIRATORY_TRACT | 2 refills | Status: DC | PRN
  Filled 2020-11-11: qty 18, 25d supply, fill #0
  Filled 2020-12-23: qty 18, 25d supply, fill #1
  Filled 2021-02-01: qty 18, 25d supply, fill #0

## 2020-11-11 MED ORDER — PAROXETINE HCL 20 MG PO TABS
ORAL_TABLET | Freq: Every day | ORAL | 3 refills | Status: DC
  Filled 2020-11-11: qty 30, 30d supply, fill #0

## 2020-11-11 NOTE — Telephone Encounter (Signed)
I called pt and made an appt for her 6 month check/refills with Dr. Shan Levans on 12/29/2020 at 8:30 AM. In office visit.  I gave her a 90 day courtesy refill on her albuterol inhaler and the Paxil 20 mg to last until her appt in Dec.

## 2020-12-23 ENCOUNTER — Other Ambulatory Visit: Payer: Self-pay

## 2020-12-29 ENCOUNTER — Other Ambulatory Visit: Payer: Self-pay

## 2020-12-29 ENCOUNTER — Encounter: Payer: Self-pay | Admitting: Critical Care Medicine

## 2020-12-29 ENCOUNTER — Ambulatory Visit: Payer: Self-pay | Attending: Critical Care Medicine | Admitting: Critical Care Medicine

## 2020-12-29 VITALS — BP 129/88 | HR 107 | Resp 16 | Wt 204.6 lb

## 2020-12-29 DIAGNOSIS — L309 Dermatitis, unspecified: Secondary | ICD-10-CM

## 2020-12-29 DIAGNOSIS — F411 Generalized anxiety disorder: Secondary | ICD-10-CM

## 2020-12-29 DIAGNOSIS — Z87891 Personal history of nicotine dependence: Secondary | ICD-10-CM

## 2020-12-29 MED ORDER — HYDROXYZINE HCL 50 MG PO TABS
50.0000 mg | ORAL_TABLET | Freq: Three times a day (TID) | ORAL | 2 refills | Status: DC | PRN
  Filled 2020-12-29 – 2021-03-15 (×2): qty 90, 30d supply, fill #0
  Filled 2021-06-06: qty 90, 30d supply, fill #1

## 2020-12-29 MED ORDER — PAROXETINE HCL 40 MG PO TABS
40.0000 mg | ORAL_TABLET | Freq: Every day | ORAL | 3 refills | Status: DC
  Filled 2020-12-29 – 2021-02-01 (×2): qty 30, 30d supply, fill #0
  Filled 2021-03-15: qty 30, 30d supply, fill #1
  Filled 2021-06-06 – 2021-06-07 (×2): qty 30, 30d supply, fill #2

## 2020-12-29 MED ORDER — CETIRIZINE HCL 10 MG PO TABS
ORAL_TABLET | Freq: Every day | ORAL | 4 refills | Status: DC
  Filled 2020-12-29: qty 30, fill #0

## 2020-12-29 MED ORDER — PREDNISONE 10 MG PO TABS
ORAL_TABLET | ORAL | 0 refills | Status: DC
  Filled 2020-12-29: qty 20, 5d supply, fill #0

## 2020-12-29 NOTE — Assessment & Plan Note (Signed)
Patient given resources as to how to access [facility] behavioral health clinic on open access days  In the interim we will increase Paxil to 40 mg daily and hydroxyzine to 50 mg 3 times daily as needed

## 2020-12-29 NOTE — Patient Instructions (Signed)
Increase hydroxyzine to 50 mg 3 times daily as needed for anxiety or itching  Increase paroxetine to 40 mg daily for depression and anxiety  Take prednisone 40 mg a day for 5 days and discontinue for eczema  Referral dermatology Dr. Deretha Emory will be made  Go to the [facility] between 8 and 11 AM Monday through Friday if you arrive at 7:45 AM you will be seen sooner for walk-in care for your mental health see handout we gave you  Obtain a good skin moisturizer like Aveeno or cerave for your eczema  Return to see Dr. Delford Field in 4 months  Follow a healthy diet see attached  You declined the flu vaccine and pneumonia vaccine

## 2020-12-29 NOTE — Assessment & Plan Note (Signed)
Generalized eczema is present on the back of the neck anterior chest lower back and legs and arms  This is intensely pruritic in nature  Plan will be to give pulse prednisone 40 mg a day for 5 days and increase hydroxyzine to 50 mg 3 times daily as needed for anxiety and/or itching  Will refer the patient to the family practice dermatology clinic if she does not have the orange card

## 2020-12-29 NOTE — Assessment & Plan Note (Signed)
The patient successfully quit smoking at this time

## 2020-12-29 NOTE — Progress Notes (Signed)
Established Patient Office Visit  Subjective:  Patient ID: [name] [name], female    DOB: [birth year]  Age: 35 y.o. MRN: [id number]  CC:  Chief Complaint  Patient presents with   Medication Management    HPI [name] [name] presents for follow-up of eczema and anxiety.  On arrival today her PHQ 9 is 7 but her GAD-7 is 14.  Patient declined to receive the pneumonia shot or flu shot.  Patient does have the orange card.  She states her eczema has been much worse and is more generalized at this time.  She states the topical Kenalog is of no benefit.  She has been using Vaseline only for moisturization.  Patient is yet to achieve the psychiatry appointment.  On arrival blood pressure is elevated at 146/87.  On recheck it was 129/88.  Patient has no other complaints at this time.  She did have a negative Pap smear in September     Past Medical History:  Diagnosis Date   Admission for sterilization 06/12/2013   Asthma    Breast abscess    Depression    HX of - not on med n, doing well.   Eczema    Family history of anesthesia complication    Grandmother has difficulty waskin up.   Pneumonia    HX only -years ago has had a few times.   Pregnant state, incidental    S/P tubal ligation 06/13/2013   SVD (spontaneous vaginal delivery)    x 2    Past Surgical History:  Procedure Laterality Date   INCISE AND DRAIN ABCESS     left breast x 2   INCISION AND DRAINAGE ABSCESS Left 02/17/2012   Procedure:  DRAINAGEof recurrent breast abscess;  Surgeon: Harl Bowie, MD;  Location: [facility];  Service: General;  Laterality: Left;   LAPAROSCOPIC TUBAL LIGATION Bilateral 06/13/2013   Procedure: LAPAROSCOPIC TUBAL LIGATION;  Surgeon: Janyth Contes, MD;  Location: [facility];  Service: Gynecology;  Laterality: Bilateral;   TUBAL LIGATION      Family History  Problem Relation Age of Onset   Healthy Mother    Anxiety disorder Mother    Healthy Father    Stroke Maternal  Grandmother    Stroke Paternal Grandfather    Bipolar disorder Neg Hx    Depression Neg Hx     Social History   Socioeconomic History   Marital status: Divorced    Spouse name: Not on file   Number of children: Not on file   Years of education: Not on file   Highest education level: Not on file  Occupational History   Not on file  Tobacco Use   Smoking status: Former    Packs/day: 1.00    Years: 5.00    Pack years: 5.00    Types: Cigarettes    Quit date: 2021    Years since quitting: 1.9   Smokeless tobacco: Never  Vaping Use   Vaping Use: Every day  Substance and Sexual Activity   Alcohol use: Yes    Comment: occasional   Drug use: No   Sexual activity: Yes    Birth control/protection: Surgical  Other Topics Concern   Not on file  Social History Narrative   Lives with boyfriend Phillips Grout and his son Lysbeth Galas 2006   Not currently working    Social Determinants of Radio broadcast assistant Strain: Not on [employer] Insecurity: Food Insecurity Present   Worried About [place]  in the Last Year: Sometimes true   Ran Out of Food in the Last Year: Sometimes true  Transportation Needs: No Transportation Needs   Lack of Transportation (Medical): No   Lack of Transportation (Non-Medical): No  Physical Activity: Not on file  Stress: Not on file  Social Connections: Not on file  Intimate Partner Violence: Not on file    Outpatient Medications Prior to Visit  Medication Sig Dispense Refill   albuterol (VENTOLIN HFA) 108 (90 Base) MCG/ACT inhaler Inhale 2 puffs into the lungs every 6 (six) hours as needed for wheezing or shortness of breath. 18 g 2   cetirizine (ZYRTEC) 10 MG tablet TAKE 1 TABLET (10 MG TOTAL) BY MOUTH AT BEDTIME. 30 tablet 4   hydrOXYzine (ATARAX/VISTARIL) 25 MG tablet TAKE 1 TABLET (25 MG TOTAL) BY MOUTH 3 (THREE) TIMES DAILY. FOR ITCHING 60 tablet 2   PARoxetine (PAXIL) 20 MG tablet TAKE 1 TABLET (20 MG TOTAL) BY MOUTH DAILY. 30  tablet 3   triamcinolone ointment (KENALOG) 0.5 % APPLY 1 APPLICATION TOPICALLY 2 (TWO) TIMES DAILY. 180 g 4   No facility-administered medications prior to visit.    Allergies  Allergen Reactions   Doxycycline Photosensitivity    ROS Review of Systems  Constitutional:  Negative for chills, diaphoresis and fever.  HENT:  Negative for congestion, hearing loss, nosebleeds, sore throat and tinnitus.   Eyes:  Negative for photophobia and redness.  Respiratory:  Negative for cough, shortness of breath, wheezing and stridor.   Cardiovascular:  Negative for chest pain, palpitations and leg swelling.  Gastrointestinal:  Negative for abdominal pain, blood in stool, constipation, diarrhea, nausea and vomiting.  Endocrine: Negative for polydipsia.  Genitourinary:  Negative for dysuria, flank pain, frequency, hematuria and urgency.  Musculoskeletal:  Negative for back pain, myalgias and neck pain.  Skin:  Positive for rash.  Allergic/Immunologic: Negative for environmental allergies.  Neurological:  Negative for dizziness, tremors, seizures, weakness and headaches.  Hematological:  Does not bruise/bleed easily.  Psychiatric/Behavioral:  Negative for dysphoric mood, sleep disturbance and suicidal ideas. The patient is nervous/anxious and is hyperactive.      Objective:    Physical Exam Vitals reviewed.  Constitutional:      Appearance: Normal appearance. She is well-developed. She is not diaphoretic.  HENT:     Head: Normocephalic and atraumatic.     Nose: No nasal deformity, septal deviation, mucosal edema or rhinorrhea.     Right Sinus: No maxillary sinus tenderness or frontal sinus tenderness.     Left Sinus: No maxillary sinus tenderness or frontal sinus tenderness.     Mouth/Throat:     Pharynx: No oropharyngeal exudate.  Eyes:     General: No scleral icterus.    Conjunctiva/sclera: Conjunctivae normal.     Pupils: Pupils are equal, round, and reactive to light.  Neck:      Thyroid: No thyromegaly.     Vascular: No carotid bruit or JVD.     Trachea: Trachea normal. No tracheal tenderness or tracheal deviation.  Cardiovascular:     Rate and Rhythm: Normal rate and regular rhythm.     Chest Wall: PMI is not displaced.     Pulses: Normal pulses. No decreased pulses.     Heart sounds: Normal heart sounds, S1 normal and S2 normal. Heart sounds not distant. No murmur heard. No systolic murmur is present.  No diastolic murmur is present.    No friction rub. No gallop. No S3 or S4 sounds.  Pulmonary:  Effort: Pulmonary effort is normal. No tachypnea, accessory muscle usage or respiratory distress.     Breath sounds: Normal breath sounds. No stridor. No decreased breath sounds, wheezing, rhonchi or rales.  Chest:     Chest wall: No tenderness.  Abdominal:     General: Bowel sounds are normal. There is no distension.     Palpations: Abdomen is soft. Abdomen is not rigid.     Tenderness: There is no abdominal tenderness. There is no guarding or rebound.  Musculoskeletal:        General: Normal range of motion.     Cervical back: Normal range of motion and neck supple. No edema, erythema or rigidity. No muscular tenderness. Normal range of motion.  Lymphadenopathy:     Head:     Right side of head: No submental or submandibular adenopathy.     Left side of head: No submental or submandibular adenopathy.     Cervical: No cervical adenopathy.  Skin:    General: Skin is warm and dry.     Coloration: Skin is not pale.     Findings: Rash present.     Nails: There is no clubbing.  Neurological:     Mental Status: She is alert and oriented to person, place, and time.     Sensory: No sensory deficit.  Psychiatric:        Speech: Speech normal.        Behavior: Behavior normal.        Thought Content: Thought content normal.    BP 129/88    Pulse (!) 107    Resp 16    Wt 204 lb 9.6 oz (92.8 kg)    SpO2 98%    BMI 39.96 kg/m  Wt Readings from Last 3 Encounters:   12/29/20 204 lb 9.6 oz (92.8 kg)  09/16/20 200 lb (90.7 kg)  06/10/20 197 lb 6.4 oz (89.5 kg)     There are no preventive care reminders to display for this patient.   There are no preventive care reminders to display for this patient.  Lab Results  Component Value Date   TSH 2.970 03/30/2020   Lab Results  Component Value Date   WBC 8.4 03/30/2020   HGB 13.4 03/30/2020   HCT 40.0 03/30/2020   MCV 95 03/30/2020   PLT 379 03/30/2020   Lab Results  Component Value Date   NA 138 03/30/2020   K 4.4 03/30/2020   CO2 23 03/30/2020   GLUCOSE 92 03/30/2020   BUN 14 03/30/2020   CREATININE 0.83 03/30/2020   BILITOT 0.3 03/30/2020   ALKPHOS 53 03/30/2020   AST 16 03/30/2020   ALT 14 03/30/2020   PROT 6.3 03/30/2020   ALBUMIN 4.3 03/30/2020   CALCIUM 9.0 03/30/2020   ANIONGAP 7 12/28/2017   EGFR 95 03/30/2020   No results found for: CHOL No results found for: HDL No results found for: LDLCALC No results found for: TRIG No results found for: CHOLHDL No results found for: HGBA1C    Assessment & Plan:   Problem List Items Addressed This Visit       Musculoskeletal and Integument   Eczema    Generalized eczema is present on the back of the neck anterior chest lower back and legs and arms  This is intensely pruritic in nature  Plan will be to give pulse prednisone 40 mg a day for 5 days and increase hydroxyzine to 50 mg 3 times daily as needed for anxiety and/or itching  Will refer the patient to the family practice dermatology clinic if she does not have the orange card        Relevant Medications   cetirizine (ZYRTEC) 10 MG tablet   hydrOXYzine (ATARAX) 50 MG tablet   Other Relevant Orders   Ambulatory referral to Dermatology     Other   GAD (generalized anxiety disorder)    Patient given resources as to how to access [facility] behavioral health clinic on open access days  In the interim we will increase Paxil to 40 mg daily and hydroxyzine to 50  mg 3 times daily as needed        Relevant Medications   hydrOXYzine (ATARAX) 50 MG tablet   PARoxetine (PAXIL) 40 MG tablet   Former tobacco use    The patient successfully quit smoking at this time       Meds ordered this encounter  Medications   cetirizine (ZYRTEC) 10 MG tablet    Sig: TAKE 1 TABLET (10 MG TOTAL) BY MOUTH AT BEDTIME.    Dispense:  30 tablet    Refill:  4   hydrOXYzine (ATARAX) 50 MG tablet    Sig: Take 1 tablet (50 mg total) by mouth every 8 (eight) hours as needed for anxiety or itching.    Dispense:  90 tablet    Refill:  2   PARoxetine (PAXIL) 40 MG tablet    Sig: Take 1 tablet (40 mg total) by mouth daily.    Dispense:  30 tablet    Refill:  3   predniSONE (DELTASONE) 10 MG tablet    Sig: Take 4 tablets daily for 5 days then stop    Dispense:  20 tablet    Refill:  0    Follow-up: Return in about 4 months (around 04/29/2021).    Asencion Noble, MD

## 2021-01-10 ENCOUNTER — Other Ambulatory Visit: Payer: Self-pay | Admitting: Critical Care Medicine

## 2021-01-10 ENCOUNTER — Ambulatory Visit: Payer: Self-pay

## 2021-01-10 MED ORDER — PREDNISONE 10 MG PO TABS: ORAL_TABLET | ORAL | 0 refills | Status: DC

## 2021-01-10 NOTE — Telephone Encounter (Signed)
Medication: Rx #: [id number] predniSONE (DELTASONE) 10 MG tablet [id number] - pt reports a flare up for ezema    Has the patient contacted their pharmacy? YES- advised to contact the office (Agent: If no, request that the patient contact the pharmacy for the refill. If patient does not wish to contact the pharmacy document the reason why and proceed with request.) (Agent: If yes, when and what did the pharmacy advise?)  Preferred Pharmacy (with phone number or street name): [facility] [address], [place] [address] Phone: [phone] Fax: [phone] Hours: M-F 7:30a-6:00p   Has the patient been seen for an appointment in the last year OR does the patient have an upcoming appointment? YES Agent: Please be advised that RX refills may take up to 3 business days. We ask that you follow-up with your pharmacy.

## 2021-01-10 NOTE — Telephone Encounter (Signed)
See NT encounter

## 2021-01-10 NOTE — Telephone Encounter (Signed)
Prednisone Rx sent

## 2021-01-10 NOTE — Telephone Encounter (Signed)
°  Chief Complaint: Eczema Symptoms: itching  Frequency: constant Pertinent Negatives: Patient denies  Disposition: [] ED /[] Urgent Care (no appt availability in office) / [] Appointment(In office/virtual)/ []  [facility]/ [] Home Care/ [] Refused Recommended Disposition /[] [facility]/ []  Follow-up with PCP Additional Notes: Pt would like an Rx for prednisone called in. Pt is unable to get an appt. With Derm and is having an eczema flare.    Answer Assessment - Initial Assessment Questions 1. DRUG NAME: "What medicine do you need to have refilled?"     prenisone 2. REFILLS REMAINING: "How many refills are remaining?" (Note: The label on the medicine or pill bottle will show how many refills are remaining. If there are no refills remaining, then a renewal may be needed.)     0 3. EXPIRATION DATE: "What is the expiration date?" (Note: The label states when the prescription will expire, and thus can no longer be refilled.)     0 4. PRESCRIBING HCP: "Who prescribed it?" Reason: If prescribed by specialist, call should be referred to that group.     Dr. and Derm. 5. SYMPTOMS: "Do you have any symptoms?"     Eczema 6. PREGNANCY: "Is there any chance that you are pregnant?" "When was your last menstrual period?"     unknown  Protocols used: Medication Refill and Renewal Call-A-AH

## 2021-01-10 NOTE — Addendum Note (Signed)
Addended by: Shan Levans E on: 01/10/2021 04:16 PM   Modules accepted: Orders

## 2021-01-14 ENCOUNTER — Other Ambulatory Visit: Payer: Self-pay

## 2021-01-27 ENCOUNTER — Ambulatory Visit (INDEPENDENT_AMBULATORY_CARE_PROVIDER_SITE_OTHER): Payer: Self-pay | Admitting: Student

## 2021-01-27 ENCOUNTER — Other Ambulatory Visit: Payer: Self-pay

## 2021-01-27 VITALS — BP 144/86 | HR 105 | Wt 204.2 lb

## 2021-01-27 DIAGNOSIS — L309 Dermatitis, unspecified: Secondary | ICD-10-CM

## 2021-01-27 MED ORDER — CLOBETASOL PROPIONATE 0.05 % EX OINT: 1.0000 "application " | TOPICAL_OINTMENT | Freq: Two times a day (BID) | CUTANEOUS | 0 refills | Status: DC

## 2021-01-27 NOTE — Patient Instructions (Signed)
[name] [name], It is such a joy to take care you! Thank you for coming in today.   As a reminder, here is a recap of what we talked about today:  -I am so sorry that you're dealing with this ongoing issue with your eczema. I think that we have pretty much exhausted what we have to offer and that it is time we reach out for specialist assistance from an allergist. It is possible that you will need to be placed on injectable therapies that we are not able to prescribe. The allergist may also want to do some further testing to look for triggers that could be contributing to your symptoms. - In the meantime, I am prescribing a high-potency steroid that I want you to use liberally on the affected areas twice daily for the next week. - Continue with the changes you've made to your diet, showering habits, etc.   Take care and seek immediate care sooner if you develop any concerns.   Marnee Guarneri, MD [place]

## 2021-01-27 NOTE — Progress Notes (Signed)
° ° °  SUBJECTIVE:   CHIEF COMPLAINT / HPI:   Eczema, Severe:  Patient presents with severe, widespread eczema flare today. She has a long-standing history and has used several different topical steroid preparations with mixed results. Most recently she has been on Triamcinolone 0.5% Ointment with little response. She was seen by her PCP, Dr. Delford Field at [facility] at the end of December and received a 5 day course of prednisone that improved her symptoms, though rash recurred after a few days. She had another course of prednisone earlier this month with similar response. She is concerned because her symptoms have gotten progressively worse as she has gotten older and her rash now involves her neck, chest, and face whereas previously it was localized to flexor surfaces of the arms and legs.  She has made a number of changes to her diet and lifestlye to try and control symptoms, including spacing out showers, avoiding hot water baths, and using anti-allergenic detergents.    OBJECTIVE:   BP (!) 144/86    Pulse (!) 105    Wt 204 lb 3.2 oz (92.6 kg)    LMP  (LMP Unknown)    SpO2 99%    BMI 39.88 kg/m   Derm: Diffuse erythematous patches of the flexor surfaces of BUE and chest. Some blistering noted on chest. Erythema with scale also noted on upper lip. See photos below.       ASSESSMENT/PLAN:   Eczema Unfortunately, patient's eczema has proven very difficult to control, even with triamcinolone 0.5%. Suspect that we have reached the limits of what we are able to offer through our clinic and patient will need specialist consult with allergy/immunology. The progression of her symptoms raises concerns for an autoimmune component to her presentation. She may well need injectable therapies that we are not able to offer in our clinic. She would also likely benefit from trigger testing with allergist.  - Will prescribe clobetasol 0.5% ointment - Recommend PCP place consult to  allergy/immunology for specialist consult     [name] Gibbs, MD [facility]

## 2021-01-27 NOTE — Assessment & Plan Note (Signed)
Unfortunately, patient's eczema has proven very difficult to control, even with triamcinolone 0.5%. Suspect that we have reached the limits of what we are able to offer through our clinic and patient will need specialist consult with allergy/immunology. The progression of her symptoms raises concerns for an autoimmune component to her presentation. She may well need injectable therapies that we are not able to offer in our clinic. She would also likely benefit from trigger testing with allergist.  - Will prescribe clobetasol 0.5% ointment - Recommend PCP place consult to allergy/immunology for specialist consult

## 2021-01-31 ENCOUNTER — Telehealth: Payer: Self-pay | Admitting: Critical Care Medicine

## 2021-01-31 DIAGNOSIS — H1013 Acute atopic conjunctivitis, bilateral: Secondary | ICD-10-CM

## 2021-01-31 DIAGNOSIS — L308 Other specified dermatitis: Secondary | ICD-10-CM

## 2021-01-31 MED ORDER — CLOBETASOL PROPIONATE 0.05 % EX OINT: 1.0000 "application " | TOPICAL_OINTMENT | Freq: Two times a day (BID) | CUTANEOUS | 0 refills | Status: DC

## 2021-01-31 NOTE — Telephone Encounter (Signed)
Called pt no answer, no vm could be left (no vm box set up). Will try again this anfternoon.

## 2021-01-31 NOTE — Addendum Note (Signed)
Addended by: Glori Bickers C on: 01/31/2021 12:12 PM   Modules accepted: Orders

## 2021-01-31 NOTE — Telephone Encounter (Signed)
-----   Message from Alicia Amel, MD sent at 01/27/2021  4:06 PM EST ----- Hi Dr. Delford Field, We saw your patient in our clinic today for her severe eczema. Unfortunately, I believe her symptoms have progressed to the point that she is likely to need specialist services from allergy/immunology. We are sending her home with clobetasol ointment but recommend that you place a referral. I suspect she will need allergy testing at a minimum and would likely be a candidate for Dupixent or other advanced therapy.  Thanks!  Dorothyann Gibbs

## 2021-01-31 NOTE — Telephone Encounter (Signed)
Sending referral to allergy for this pt , pls call her to notify her,  dermatology said she needs allergy eval

## 2021-01-31 NOTE — Progress Notes (Addendum)
Transmission to pharmacy failed for clobetasol ointment on 1/26. Resent electronically and received receipt from pharmacy on 01/31/21 at 1211.  Veronda Prude, RN

## 2021-01-31 NOTE — Telephone Encounter (Signed)
Called pt and she is aware  ?

## 2021-02-01 ENCOUNTER — Other Ambulatory Visit: Payer: Self-pay

## 2021-02-02 ENCOUNTER — Other Ambulatory Visit: Payer: Self-pay

## 2021-02-02 ENCOUNTER — Other Ambulatory Visit: Payer: Self-pay | Admitting: Critical Care Medicine

## 2021-02-02 DIAGNOSIS — L309 Dermatitis, unspecified: Secondary | ICD-10-CM

## 2021-02-24 ENCOUNTER — Ambulatory Visit: Payer: Self-pay | Attending: Physician Assistant | Admitting: Physician Assistant

## 2021-02-24 ENCOUNTER — Other Ambulatory Visit: Payer: Self-pay

## 2021-02-24 VITALS — BP 135/72 | HR 100 | Resp 18 | Ht 60.0 in | Wt 211.0 lb

## 2021-02-24 DIAGNOSIS — Z131 Encounter for screening for diabetes mellitus: Secondary | ICD-10-CM

## 2021-02-24 DIAGNOSIS — R635 Abnormal weight gain: Secondary | ICD-10-CM

## 2021-02-24 DIAGNOSIS — F513 Sleepwalking [somnambulism]: Secondary | ICD-10-CM

## 2021-02-24 DIAGNOSIS — R0683 Snoring: Secondary | ICD-10-CM

## 2021-02-24 DIAGNOSIS — R5383 Other fatigue: Secondary | ICD-10-CM

## 2021-02-24 NOTE — Progress Notes (Signed)
Patient ID: [name] [name], female   DOB: Feb 26, 1985, 36 y.o.   MRN: [id number]     Kellina Dreese, is a 36 y.o. female  [id number]  [id number]  DOB - 03-17-1985  Chief Complaint  Patient presents with   Fatigue   Weight Gain       Subjective:   [name] [name] is a 36 y.o. female here today for fatigue.  Poor sleep at night with sleep walking and her bf says she snores.  When she sleeps on her back, she sometimes stops breathing and wakes up gasping.    Works [employer].  1st shift.  2 kids ages 8&10.  So tired all the times.  Esp in the last 3 months.  Weight 06/2020=197;  today 211.    Periods regular and normal.   No problems updated.  ALLERGIES: Allergies  Allergen Reactions   Doxycycline Photosensitivity    PAST MEDICAL HISTORY: Past Medical History:  Diagnosis Date   Admission for sterilization 06/12/2013   Asthma    Breast abscess    Depression    HX of - not on med n, doing well.   Eczema    Family history of anesthesia complication    Grandmother has difficulty waskin up.   Pneumonia    HX only -years ago has had a few times.   Pregnant state, incidental    S/P tubal ligation 06/13/2013   SVD (spontaneous vaginal delivery)    x 2    MEDICATIONS AT HOME: Prior to Admission medications   Medication Sig Start Date End Date Taking? Authorizing Provider  albuterol (VENTOLIN HFA) 108 (90 Base) MCG/ACT inhaler Inhale 2 puffs into the lungs every 6 (six) hours as needed for wheezing or shortness of breath. 11/11/20 11/11/21 Yes Storm Frisk, MD  cetirizine (ZYRTEC) 10 MG tablet TAKE 1 TABLET (10 MG TOTAL) BY MOUTH AT BEDTIME. 12/29/20 12/29/21 Yes Storm Frisk, MD  hydrOXYzine (ATARAX) 50 MG tablet Take 1 tablet (50 mg total) by mouth every 8 (eight) hours as needed for anxiety or itching. 12/29/20 12/29/21 Yes Storm Frisk, MD  PARoxetine (PAXIL) 40 MG tablet Take 1 tablet (40 mg total) by mouth daily. 12/29/20 12/29/21 Yes Storm Frisk, MD  clobetasol ointment (TEMOVATE) 0.05 % Apply 1 application topically 2 (two) times daily. Patient not taking: Reported on 02/24/2021 01/31/21   Alicia Amel, MD  predniSONE (DELTASONE) 10 MG tablet Take 4 tablets daily for 5 days then stop 01/10/21   Storm Frisk, MD  escitalopram (LEXAPRO) 10 MG tablet Take 1 tablet (10 mg total) by mouth daily. Patient not taking: Reported on 12/01/2019 11/12/19 12/01/19  Cain Saupe, MD  famotidine (PEPCID) 20 MG tablet Take 1 tablet (20 mg total) by mouth 2 (two) times daily. 01/03/18 06/26/19  Michela Pitcher A, PA-C  gabapentin (NEURONTIN) 100 MG capsule Take 1 capsule (100 mg total) by mouth 3 (three) times daily. 11/21/18 06/26/19  Fayrene Helper, PA-C    ROS: Neg HEENT Neg resp Neg cardiac Neg GI Neg GU Neg MS Neg psych Neg neuro  Objective:   Vitals:   02/24/21 0934  BP: 135/72  Pulse: 100  Resp: 18  SpO2: 97%  Weight: 211 lb (95.7 kg)  Height: 5' (1.524 m)   Exam General appearance : Awake, alert, not in any distress. Speech Clear. Not toxic looking;  overweight HEENT: Atraumatic and Normocephalic Neck: Supple, no JVD. No cervical lymphadenopathy.  Chest: Good air entry bilaterally, CTAB.  No rales/rhonchi/wheezing CVS:  S1 S2 regular, no murmurs.  Extremities: B/L Lower Ext shows no edema, both legs are warm to touch Neurology: Awake alert, and oriented X 3, CN II-XII intact, Non focal Skin: No Rash  Data Review No results found for: HGBA1C  Assessment & Plan   1. Weight gain Focus diet on lean proteins and non-starchy vegetables - Vitamin D, 25-hydroxy - Thyroid Panel With TSH - Split night study; Future  2. Other fatigue - Vitamin D, 25-hydroxy - CBC with Differential/Platelet - Thyroid Panel With TSH - Split night study; Future  3. Snoring - Split night study; Future  4. Screening for diabetes mellitus I have had a lengthy discussion and provided education about insulin resistance and the intake of  too much sugar/refined carbohydrates.  I have advised the patient to work at a goal of eliminating sugary drinks, candy, desserts, sweets, refined sugars, processed foods, and white carbohydrates.  The patient expresses understanding.   - Hemoglobin A1c  5. Sleep walking - Split night study; Future    Patient have been counseled extensively about nutrition and exercise. Other issues discussed during this visit include: low cholesterol diet, weight control and daily exercise, foot care, annual eye examinations at Ophthalmology, importance of adherence with medications and regular follow-up. We also discussed long term complications of uncontrolled diabetes and hypertension.   Return in about 4 months (around 06/24/2021) for Dr Delford Field for chronic conditions.  The patient was given clear instructions to go to ER or return to medical center if symptoms don't improve, worsen or new problems develop. The patient verbalized understanding. The patient was told to call to get lab results if they haven't heard anything in the next week.      Georgian Co, PA-C [facility] [place], [place] [phone]   02/24/2021, 10:06 AM

## 2021-02-24 NOTE — Patient Instructions (Signed)
Sleep Apnea ?Sleep apnea affects breathing during sleep. It causes breathing to stop for 10 seconds or more, or to become shallow. People with sleep apnea usually snore loudly. ?It can also increase the risk of: ?Heart attack. ?Stroke. ?Being very overweight (obese). ?Diabetes. ?Heart failure. ?Irregular heartbeat. ?High blood pressure. ?The goal of treatment is to help you breathe normally again. ?What are the causes? ?The most common cause of this condition is a collapsed or blocked airway. ?There are three kinds of sleep apnea: ?Obstructive sleep apnea. This is caused by a blocked or collapsed airway. ?Central sleep apnea. This happens when the brain does not send the right signals to the muscles that control breathing. ?Mixed sleep apnea. This is a combination of obstructive and central sleep apnea. ?What increases the risk? ?Being overweight. ?Smoking. ?Having a small airway. ?Being older. ?Being female. ?Drinking alcohol. ?Taking medicines to calm yourself (sedatives or tranquilizers). ?Having family members with the condition. ?Having a tongue or tonsils that are larger than normal. ?What are the signs or symptoms? ?Trouble staying asleep. ?Loud snoring. ?Headaches in the morning. ?Waking up gasping. ?Dry mouth or sore throat in the morning. ?Being sleepy or tired during the day. ?If you are sleepy or tired during the day, you may also: ?Not be able to focus your mind (concentrate). ?Forget things. ?Get angry a lot and have mood swings. ?Feel sad (depressed). ?Have changes in your personality. ?Have less interest in sex, if you are female. ?Be unable to have an erection, if you are female. ?How is this treated? ? ?Sleeping on your side. ?Using a medicine to get rid of mucus in your nose (decongestant). ?Avoiding the use of alcohol, medicines to help you relax, or certain pain medicines (narcotics). ?Losing weight, if needed. ?Changing your diet. ?Quitting smoking. ?Using a machine to open your airway while you  sleep, such as: ?An oral appliance. This is a mouthpiece that shifts your lower jaw forward. ?A CPAP device. This device blows air through a mask when you breathe out (exhale). ?An EPAP device. This has valves that you put in each nostril. ?A BIPAP device. This device blows air through a mask when you breathe in (inhale) and breathe out. ?Having surgery if other treatments do not work. ?Follow these instructions at home: ?Lifestyle ?Make changes that your doctor recommends. ?Eat a healthy diet. ?Lose weight if needed. ?Avoid alcohol, medicines to help you relax, and some pain medicines. ?Do not smoke or use any products that contain nicotine or tobacco. If you need help quitting, ask your doctor. ?General instructions ?Take over-the-counter and prescription medicines only as told by your doctor. ?If you were given a machine to use while you sleep, use it only as told by your doctor. ?If you are having surgery, make sure to tell your doctor you have sleep apnea. You may need to bring your device with you. ?Keep all follow-up visits. ?Contact a doctor if: ?The machine that you were given to use during sleep bothers you or does not seem to be working. ?You do not get better. ?You get worse. ?Get help right away if: ?Your chest hurts. ?You have trouble breathing in enough air. ?You have an uncomfortable feeling in your back, arms, or stomach. ?You have trouble talking. ?One side of your body feels weak. ?A part of your face is hanging down. ?These symptoms may be an emergency. Get help right away. Call your local emergency services (911 in the [place] ?Do not wait to see if the symptoms   will go away. ?Do not drive yourself to the hospital. ?Summary ?This condition affects breathing during sleep. ?The most common cause is a collapsed or blocked airway. ?The goal of treatment is to help you breathe normally while you sleep. ?This information is not intended to replace advice given to you by your health care provider. Make  sure you discuss any questions you have with your health care provider. ?Document Revised: 07/28/2020 Document Reviewed: 11/28/2019 ?Elsevier Patient Education ? 2022 Elsevier Inc. ? ?

## 2021-02-25 LAB — CBC WITH DIFFERENTIAL/PLATELET
Basophils Absolute: 0.1 10*3/uL (ref 0.0–0.2)
Basos: 1 %
EOS (ABSOLUTE): 0.5 10*3/uL — ABNORMAL HIGH (ref 0.0–0.4)
Eos: 8 %
Hematocrit: 38.8 % (ref 34.0–46.6)
Hemoglobin: 13.2 g/dL (ref 11.1–15.9)
Immature Grans (Abs): 0 10*3/uL (ref 0.0–0.1)
Immature Granulocytes: 0 %
Lymphocytes Absolute: 1.8 10*3/uL (ref 0.7–3.1)
Lymphs: 28 %
MCH: 31.6 pg (ref 26.6–33.0)
MCHC: 34 g/dL (ref 31.5–35.7)
MCV: 93 fL (ref 79–97)
Monocytes Absolute: 0.5 10*3/uL (ref 0.1–0.9)
Monocytes: 8 %
Neutrophils Absolute: 3.6 10*3/uL (ref 1.4–7.0)
Neutrophils: 55 %
Platelets: 395 10*3/uL (ref 150–450)
RBC: 4.18 x10E6/uL (ref 3.77–5.28)
RDW: 12.4 % (ref 11.7–15.4)
WBC: 6.5 10*3/uL (ref 3.4–10.8)

## 2021-02-25 LAB — THYROID PANEL WITH TSH
Free Thyroxine Index: 1.6 (ref 1.2–4.9)
T3 Uptake Ratio: 21 % — ABNORMAL LOW (ref 24–39)
T4, Total: 7.5 ug/dL (ref 4.5–12.0)
TSH: 3.03 u[IU]/mL (ref 0.450–4.500)

## 2021-02-25 LAB — VITAMIN D 25 HYDROXY (VIT D DEFICIENCY, FRACTURES): Vit D, 25-Hydroxy: 16.2 ng/mL — ABNORMAL LOW (ref 30.0–100.0)

## 2021-02-25 LAB — HEMOGLOBIN A1C
Est. average glucose Bld gHb Est-mCnc: 108 mg/dL
Hgb A1c MFr Bld: 5.4 % (ref 4.8–5.6)

## 2021-03-02 ENCOUNTER — Other Ambulatory Visit: Payer: Self-pay

## 2021-03-02 ENCOUNTER — Other Ambulatory Visit: Payer: Self-pay | Admitting: Physician Assistant

## 2021-03-02 DIAGNOSIS — E559 Vitamin D deficiency, unspecified: Secondary | ICD-10-CM

## 2021-03-02 MED ORDER — VITAMIN D (ERGOCALCIFEROL) 1.25 MG (50000 UNIT) PO CAPS
50000.0000 [IU] | ORAL_CAPSULE | ORAL | 0 refills | Status: DC
  Filled 2021-03-02: qty 12, 84d supply, fill #0
  Filled 2021-06-06: qty 4, 28d supply, fill #1

## 2021-03-15 ENCOUNTER — Other Ambulatory Visit: Payer: Self-pay | Admitting: Critical Care Medicine

## 2021-03-15 ENCOUNTER — Other Ambulatory Visit: Payer: Self-pay

## 2021-03-15 DIAGNOSIS — J4540 Moderate persistent asthma, uncomplicated: Secondary | ICD-10-CM

## 2021-03-15 MED ORDER — ALBUTEROL SULFATE HFA 108 (90 BASE) MCG/ACT IN AERS
2.0000 | INHALATION_SPRAY | Freq: Four times a day (QID) | RESPIRATORY_TRACT | 2 refills | Status: DC | PRN
Start: 2021-03-15 — End: 2021-07-28
  Filled 2021-03-15: qty 18, 25d supply, fill #0
  Filled 2021-06-06: qty 18, 25d supply, fill #1
  Filled 2021-07-27: qty 18, 25d supply, fill #2

## 2021-03-15 NOTE — Telephone Encounter (Signed)
Requested Prescriptions  ?Pending Prescriptions Disp Refills  ?? albuterol (VENTOLIN HFA) 108 (90 Base) MCG/ACT inhaler 18 g 2  ?  Sig: Inhale 2 puffs into the lungs every 6 (six) hours as needed for wheezing or shortness of breath.  ?  ? Pulmonology:  Beta Agonists 2 Passed - 03/15/2021 12:10 PM  ?  ?  Passed - Last BP in normal range  ?  BP Readings from Last 1 Encounters:  ?02/24/21 135/72  ?   ?  ?  Passed - Last Heart Rate in normal range  ?  Pulse Readings from Last 1 Encounters:  ?02/24/21 100  ?   ?  ?  Passed - Valid encounter within last 12 months  ?  Recent Outpatient Visits   ?      ? 2 weeks ago Weight gain  ? [place], [place], [place]  ? 2 months ago Eczema, unspecified type  ? [place] Elsie Stain, MD  ? 11 months ago GAD (generalized anxiety disorder)  ? [place] Elsie Stain, MD  ? 1 year ago GAD (generalized anxiety disorder)  ? [place], [place], [place]  ? 1 year ago GAD (generalized anxiety disorder)  ? [place], [place], NP  ?  ?  ? ?  ?  ?  ? ? ?

## 2021-03-20 NOTE — Progress Notes (Deleted)
? ?New Patient Note ? ?RE: [name] [name] MRN: [id number] DOB: 1985-04-20 ?Date of Office Visit: 03/21/2021 ? ?Consult requested by: Storm Frisk, MD ?Primary care provider: Storm Frisk, MD ? ?Chief Complaint: No chief complaint on file. ? ?History of Present Illness: ?I had the pleasure of seeing [name] [name] for initial evaluation at the [facility] on 03/20/2021. She is a 36 y.o. female, who is referred here by Storm Frisk, MD for the evaluation of eczema and allergic rhinoconjunctivitis. ? ?Rash started about *** ago. Mainly occurs on her ***. Describes them as ***. Individual rashes lasts about ***. No ecchymosis upon resolution. Associated symptoms include: ***.  ?Frequency of episodes: ***. ?Suspected triggers are ***. Denies any *** fevers, chills, changes in medications, foods, personal care products or recent infections. She has tried the following therapies: *** with *** benefit. Systemic steroids ***. Currently on ***.  ?Previous work up includes: ***. ?Previous history of rash/hives: ***. ?Patient is up to date with the following cancer screening tests: ***. ? ?She reports symptoms of ***. Symptoms have been going on for *** years. The symptoms are present *** all year around with worsening in ***. Other triggers include exposure to ***. Anosmia: ***. Headache: ***. She has used *** with ***fair improvement in symptoms. Sinus infections: ***. Previous work up includes: ***. ?Previous ENT evaluation: ***. ?Previous sinus imaging: ***. ?History of nasal polyps: ***. ?Last eye exam: ***. ?History of reflux: ***. ? ?We saw your patient in our clinic today for her severe eczema. Unfortunately, I believe her symptoms have progressed to the point that she is likely to need specialist services from allergy/immunology. We are sending her home with clobetasol ointment but recommend that you place a referral. I suspect she will need allergy testing at a minimum and  would likely be a candidate for Dupixent or other advanced therapy.  ?Assessment and Plan: ?[name] [name] is a 36 y.o. female with: ?No problem-specific Assessment & Plan notes found for this encounter. ? ?No follow-ups on file. ? ?No orders of the defined types were placed in this encounter. ? ?Lab Orders  ?No laboratory test(s) ordered today  ? ? ?Other allergy screening: ?Asthma: {Blank single:19197::"yes","no"} ?Rhino conjunctivitis: {Blank single:19197::"yes","no"} ?Food allergy: {Blank single:19197::"yes","no"} ?Medication allergy: {Blank single:19197::"yes","no"} ?Hymenoptera allergy: {Blank single:19197::"yes","no"} ?Urticaria: {Blank single:19197::"yes","no"} ?Eczema:{Blank single:19197::"yes","no"} ?History of recurrent infections suggestive of immunodeficency: {Blank single:19197::"yes","no"} ? ?Diagnostics: ?Spirometry:  ?Tracings reviewed. Her effort: {Blank single:19197::"Good reproducible efforts.","It was hard to get consistent efforts and there is a question as to whether this reflects a maximal maneuver.","Poor effort, data can not be interpreted."} ?FVC: ***L ?FEV1: ***L, ***% predicted ?FEV1/FVC ratio: ***% ?Interpretation: {Blank single:19197::"Spirometry consistent with mild obstructive disease","Spirometry consistent with moderate obstructive disease","Spirometry consistent with severe obstructive disease","Spirometry consistent with possible restrictive disease","Spirometry consistent with mixed obstructive and restrictive disease","Spirometry uninterpretable due to technique","Spirometry consistent with normal pattern","No overt abnormalities noted given today's efforts"}.  ?Please see scanned spirometry results for details. ? ?Skin Testing: {Blank single:19197::"Select foods","Environmental allergy panel","Environmental allergy panel and select foods","Food allergy panel","None","Deferred due to recent antihistamines use"}. ?*** ?Results discussed with patient/family. ? ? ?Past Medical  History: ?Patient Active Problem List  ? Diagnosis Date Noted  ?? Women's annual routine gynecological examination 06/10/2020  ?? Screening examination for STD (sexually transmitted disease) 06/10/2020  ?? Former tobacco use 12/07/2014  ?? Major depressive disorder, recurrent episode, moderate (HCC) 01/13/2014  ?? GAD (generalized anxiety disorder) 01/13/2014  ?? S/P tubal ligation 06/13/2013  ?? Allergic conjunctivitis  of both eyes and rhinitis 10/14/2012  ?? Asthma 03/01/2006  ?? Eczema 03/01/2006  ? ?Past Medical History:  ?Diagnosis Date  ?? Admission for sterilization 06/12/2013  ?? Asthma   ?? Breast abscess   ?? Depression   ? HX of - not on med n, doing well.  ?? Eczema   ?? Family history of anesthesia complication   ? Grandmother has difficulty waskin up.  ?? Pneumonia   ? HX only -years ago has had a few times.  ?? Pregnant state, incidental   ?? S/P tubal ligation 06/13/2013  ?? SVD (spontaneous vaginal delivery)   ? x 2  ? ?Past Surgical History: ?Past Surgical History:  ?Procedure Laterality Date  ?? INCISE AND DRAIN ABCESS    ? left breast x 2  ?? INCISION AND DRAINAGE ABSCESS Left 02/17/2012  ? Procedure:  DRAINAGEof recurrent breast abscess;  Surgeon: Shelly Rubenstein, MD;  Location: [facility] OR;  Service: General;  Laterality: Left;  ?? LAPAROSCOPIC TUBAL LIGATION Bilateral 06/13/2013  ? Procedure: LAPAROSCOPIC TUBAL LIGATION;  Surgeon: Sherian Rein, MD;  Location: [facility];  Service: Gynecology;  Laterality: Bilateral;  ?? TUBAL LIGATION    ? ?Medication List:  ?Current Outpatient Medications  ?Medication Sig Dispense Refill  ?? Vitamin D, Ergocalciferol, (DRISDOL) 1.25 MG (50000 UNIT) CAPS capsule Take 1 capsule (50,000 Units total) by mouth every 7 (seven) days. 16 capsule 0  ?? albuterol (VENTOLIN HFA) 108 (90 Base) MCG/ACT inhaler Inhale 2 puffs into the lungs every 6 (six) hours as needed for wheezing or shortness of breath. 18 g 2  ?? cetirizine (ZYRTEC) 10 MG tablet TAKE 1 TABLET (10 MG  TOTAL) BY MOUTH AT BEDTIME. 30 tablet 4  ?? clobetasol ointment (TEMOVATE) 0.05 % Apply 1 application topically 2 (two) times daily. (Patient not taking: Reported on 02/24/2021) 30 g 0  ?? hydrOXYzine (ATARAX) 50 MG tablet Take 1 tablet (50 mg total) by mouth every 8 (eight) hours as needed for anxiety or itching. 90 tablet 2  ?? PARoxetine (PAXIL) 40 MG tablet Take 1 tablet (40 mg total) by mouth daily. 30 tablet 3  ?? predniSONE (DELTASONE) 10 MG tablet Take 4 tablets daily for 5 days then stop 20 tablet 0  ? ?No current facility-administered medications for this visit.  ? ?Allergies: ?Allergies  ?Allergen Reactions  ?? Doxycycline Photosensitivity  ? ?Social History: ?Social History  ? ?Socioeconomic History  ?? Marital status: Divorced  ?  Spouse name: Not on file  ?? Number of children: Not on file  ?? Years of education: Not on file  ?? Highest education level: Not on file  ?Occupational History  ?? Not on file  ?Tobacco Use  ?? Smoking status: Former  ?  Packs/day: 1.00  ?  Years: 5.00  ?  Pack years: 5.00  ?  Types: Cigarettes  ?  Quit date: 2021  ?  Years since quitting: 2.2  ?? Smokeless tobacco: Never  ?Vaping Use  ?? Vaping Use: Every day  ?Substance and Sexual Activity  ?? Alcohol use: Yes  ?  Comment: occasional  ?? Drug use: No  ?? Sexual activity: Yes  ?  Birth control/protection: Surgical  ?Other Topics Concern  ?? Not on file  ?Social History Narrative  ? Lives with boyfriend Eligah East and his son Sheria Lang 2006  ? Not currently working   ? ?Social Determinants of Health  ? ?Financial Resource Strain: Not on file  ?Food Insecurity: Food Insecurity Present  ?? Worried About  Running Out of Food in the Last Year: Sometimes true  ?? Ran Out of Food in the Last Year: Sometimes true  ?Transportation Needs: No Transportation Needs  ?? Lack of Transportation (Medical): No  ?? Lack of Transportation (Non-Medical): No  ?Physical Activity: Not on file  ?Stress: Not on file  ?Social Connections: Not on  file  ? ?Lives in a ***. ?Smoking: *** ?Occupation: *** ? ?Environmental History: ?Water Damage/mildew in the house: {Blank single:19197::"yes","no"} ?Carpet in the family room: {Blank single:19197::"yes","no"} ?Carp

## 2021-03-21 ENCOUNTER — Other Ambulatory Visit: Payer: Self-pay

## 2021-03-21 ENCOUNTER — Ambulatory Visit: Payer: Self-pay | Admitting: Allergy

## 2021-04-04 ENCOUNTER — Ambulatory Visit (HOSPITAL_BASED_OUTPATIENT_CLINIC_OR_DEPARTMENT_OTHER): Payer: Self-pay | Attending: Physician Assistant | Admitting: Internal Medicine

## 2021-04-04 DIAGNOSIS — R0683 Snoring: Secondary | ICD-10-CM | POA: Insufficient documentation

## 2021-04-04 DIAGNOSIS — F513 Sleepwalking [somnambulism]: Secondary | ICD-10-CM | POA: Insufficient documentation

## 2021-04-04 DIAGNOSIS — R5383 Other fatigue: Secondary | ICD-10-CM | POA: Insufficient documentation

## 2021-04-04 DIAGNOSIS — R635 Abnormal weight gain: Secondary | ICD-10-CM | POA: Insufficient documentation

## 2021-04-06 DIAGNOSIS — R635 Abnormal weight gain: Secondary | ICD-10-CM

## 2021-04-06 NOTE — Procedures (Signed)
? ? ?  Patient Name: [name] [name], [name] [name] ?Study Date: 04/04/2021 ?Gender: Female ?D.O.B: Nov 22, 1985 ?Age (years): 51 ?Referring Provider: Anders Simmonds PA-C ?Height (inches): 60 ?Interpreting Physician: Jetty Duhamel MD, ABSM ?Weight (lbs): 200 ?RPSGT: [name] [name] ?BMI: 39 ?MRN: [id number] ?Neck Size: 15.75 ? ?CLINICAL INFORMATION ?Sleep Study Type: NPSG ?Indication for sleep study: Excessive Daytime Sleepiness, Fatigue, Morning Headaches, Obesity, Sleep walking/talking/parasomnias, Snoring ?Epworth Sleepiness Score: 9 ? ?SLEEP STUDY TECHNIQUE ?As per the AASM Manual for the Scoring of Sleep and Associated Events v2.3 (April 2016) with a hypopnea requiring 4% desaturations. ? ?The channels recorded and monitored were frontal, central and occipital EEG, electrooculogram (EOG), submentalis EMG (chin), nasal and oral airflow, thoracic and abdominal wall motion, anterior tibialis EMG, snore microphone, electrocardiogram, and pulse oximetry. ? ?MEDICATIONS ?Medications self-administered by patient taken the night of the study : none reported ? ?SLEEP ARCHITECTURE ?The study was initiated at 10:14:29 PM and ended at 5:30:41 AM. ? ?Sleep onset time was 24.0 minutes and the sleep efficiency was 79.3%%. The total sleep time was 346 minutes. ? ?Stage REM latency was 271.5 minutes. ? ?The patient spent 15.8%% of the night in stage N1 sleep, 61.0%% in stage N2 sleep, 0.0%% in stage N3 and 23.3% in REM. ? ?Alpha intrusion was absent. ? ?Supine sleep was 37.43%. ? ?RESPIRATORY PARAMETERS ?The overall apnea/hypopnea index (AHI) was 0.9 per hour. There were 0 total apneas, including 0 obstructive, 0 central and 0 mixed apneas. There were 5 hypopneas and 27 RERAs. ? ?The AHI during Stage REM sleep was 2.2 per hour. ? ?AHI while supine was 2.3 per hour. ? ?The mean oxygen saturation was 93.7%. The minimum SpO2 during sleep was 88.0%. ? ?moderate snoring was noted during this study. ? ?CARDIAC DATA ?The 2 lead EKG demonstrated  sinus rhythm. The mean heart rate was 81.5 beats per minute. Other EKG findings include: None. ? ?LEG MOVEMENT DATA ?The total PLMS were 0 with a resulting PLMS index of 0.0. Associated arousal with leg movement index was 0.9 . ? ?IMPRESSIONS ?- No significant obstructive sleep apnea occurred during this study (AHI = 0.9/h). ?- The patient had minimal or no oxygen desaturation during the study (Min O2 = 88.0%). Mean O2 saturation 94%. ?- The patient snored with moderate snoring volume. ?- No cardiac abnormalities were noted during this study. ?- Clinically significant periodic limb movements did not occur during sleep. No significant associated arousals. ? ?DIAGNOSIS ?- Primary Snoring ? ?RECOMMENDATIONS ?- Manage for symptoms and snoring based on clinical judgment. ?- Be careful with alcohol, sedatives and other CNS depressants that may worsen sleep apnea and disrupt normal sleep architecture. ?- Sleep hygiene should be reviewed to assess factors that may improve sleep quality. ?- Weight management and regular exercise should be initiated or continued if appropriate. ? ?[Electronically signed] 04/06/2021 02:06 PM ? ?Jetty Duhamel MD, ABSM ?Diplomate, Biomedical engineer of Sleep Medicine ?NPI: [id number] ? ?  ? ? ? ? ? ? ? ? ? ? ? ? ? ? ? ? ? ? ? ? ? ?[name] [name] ?Diplomate, Biomedical engineer of Sleep Medicine ? ?ELECTRONICALLY SIGNED ON:  04/06/2021, 2:03 PM ?[facility] ?PH: [phone]   FX: [phone] ?ACCREDITED BY THE AMERICAN ACADEMY OF SLEEP MEDICINE ?

## 2021-04-18 ENCOUNTER — Telehealth: Payer: Self-pay | Admitting: Critical Care Medicine

## 2021-04-18 NOTE — Telephone Encounter (Signed)
I tried to call the patient there is no answer ? ?Tell her her sleep study came back completely normal she does have snoring but no significant sleep apnea ? ?Recommendation is continued weight loss minimize alcohol use and consider using Breathe Right nasal strips to help reduce snoring but there is no need for any treatment for sleep-related disorders such as a CPAP machine ?

## 2021-04-19 NOTE — Telephone Encounter (Signed)
Called pt, DOB was confirmed and pt is aware of results ?

## 2021-06-06 ENCOUNTER — Other Ambulatory Visit: Payer: Self-pay

## 2021-06-07 ENCOUNTER — Other Ambulatory Visit: Payer: Self-pay

## 2021-06-08 ENCOUNTER — Other Ambulatory Visit: Payer: Self-pay

## 2021-07-27 ENCOUNTER — Other Ambulatory Visit: Payer: Self-pay

## 2021-07-27 ENCOUNTER — Other Ambulatory Visit: Payer: Self-pay | Admitting: Critical Care Medicine

## 2021-07-27 ENCOUNTER — Other Ambulatory Visit: Payer: Self-pay | Admitting: Physician Assistant

## 2021-07-27 DIAGNOSIS — E559 Vitamin D deficiency, unspecified: Secondary | ICD-10-CM

## 2021-07-27 DIAGNOSIS — L309 Dermatitis, unspecified: Secondary | ICD-10-CM

## 2021-07-27 DIAGNOSIS — F411 Generalized anxiety disorder: Secondary | ICD-10-CM

## 2021-07-28 ENCOUNTER — Encounter: Payer: Self-pay | Admitting: Physician Assistant

## 2021-07-28 ENCOUNTER — Other Ambulatory Visit: Payer: Self-pay

## 2021-07-28 ENCOUNTER — Other Ambulatory Visit: Payer: Self-pay | Admitting: Pharmacist

## 2021-07-28 ENCOUNTER — Ambulatory Visit: Payer: Medicaid Other | Attending: Physician Assistant | Admitting: Physician Assistant

## 2021-07-28 VITALS — BP 148/80 | HR 76 | Ht 60.0 in | Wt 211.2 lb

## 2021-07-28 DIAGNOSIS — R030 Elevated blood-pressure reading, without diagnosis of hypertension: Secondary | ICD-10-CM

## 2021-07-28 DIAGNOSIS — F411 Generalized anxiety disorder: Secondary | ICD-10-CM

## 2021-07-28 DIAGNOSIS — R519 Headache, unspecified: Secondary | ICD-10-CM

## 2021-07-28 DIAGNOSIS — J4540 Moderate persistent asthma, uncomplicated: Secondary | ICD-10-CM

## 2021-07-28 MED ORDER — PAROXETINE HCL 40 MG PO TABS
40.0000 mg | ORAL_TABLET | Freq: Every day | ORAL | 0 refills | Status: DC
  Filled 2021-07-28: qty 25, 25d supply, fill #0
  Filled 2021-07-28: qty 5, 5d supply, fill #0
  Filled 2021-10-02: qty 25, 25d supply, fill #1

## 2021-07-28 MED ORDER — AMLODIPINE BESYLATE 5 MG PO TABS
5.0000 mg | ORAL_TABLET | Freq: Every day | ORAL | 0 refills | Status: DC
Start: 2021-07-28 — End: 2021-11-16
  Filled 2021-07-28: qty 90, 90d supply, fill #0

## 2021-07-28 MED ORDER — ALBUTEROL SULFATE HFA 108 (90 BASE) MCG/ACT IN AERS
2.0000 | INHALATION_SPRAY | Freq: Four times a day (QID) | RESPIRATORY_TRACT | 0 refills | Status: DC | PRN
  Filled 2021-10-02: qty 6.7, 25d supply, fill #0

## 2021-07-28 NOTE — Progress Notes (Signed)
Patient ID: [name] [name], female   DOB: 08-08-1985, 36 y.o.   MRN: [id number]   [name] [name], is a 36 y.o. female  EHU:[id number]  ZCH:[id number]  DOB - December 12, 1985  Chief Complaint  Patient presents with   Headache       Subjective:   [name] [name] is a 36 y.o. female here today for L sided HA in the parietal region for about 1 month now.  BP is always about 150s/80s-90 at home with home monitor.  No vision changes.  No diplopia.  Slow visual response/lag in vision at times when she turns her head quickly  Onset:  1 month ago.   Location:  L parietal area of head Duration: daily Characterization/quality: dull and aching Alleviating/aggravating:  OTC migraine meds with caffeine help Radiation: no Severity:  mild to moderate  No problems updated.  ALLERGIES: Allergies  Allergen Reactions   Doxycycline Photosensitivity    PAST MEDICAL HISTORY: Past Medical History:  Diagnosis Date   Admission for sterilization 06/12/2013   Asthma    Breast abscess    Depression    HX of - not on med n, doing well.   Eczema    Family history of anesthesia complication    Grandmother has difficulty waskin up.   Pneumonia    HX only -years ago has had a few times.   Pregnant state, incidental    S/P tubal ligation 06/13/2013   SVD (spontaneous vaginal delivery)    x 2    MEDICATIONS AT HOME: Prior to Admission medications   Medication Sig Start Date End Date Taking? Authorizing Provider  albuterol (VENTOLIN HFA) 108 (90 Base) MCG/ACT inhaler Inhale 2 puffs into the lungs every 6 (six) hours as needed for wheezing or shortness of breath. 03/15/21 03/15/22 Yes Storm Frisk, MD  amLODipine (NORVASC) 5 MG tablet Take 1 tablet (5 mg total) by mouth daily. 07/28/21  Yes Anders Simmonds, PA-C  cetirizine (ZYRTEC) 10 MG tablet TAKE 1 TABLET (10 MG TOTAL) BY MOUTH AT BEDTIME. 12/29/20 12/29/21 Yes Storm Frisk, MD  hydrOXYzine (ATARAX) 50 MG tablet Take 1 tablet  (50 mg total) by mouth every 8 (eight) hours as needed for anxiety or itching. 12/29/20 12/29/21 Yes Storm Frisk, MD  PARoxetine (PAXIL) 40 MG tablet Take 1 tablet (40 mg total) by mouth daily. 12/29/20 12/29/21 Yes Storm Frisk, MD  Vitamin D, Ergocalciferol, (DRISDOL) 1.25 MG (50000 UNIT) CAPS capsule Take 1 capsule (50,000 Units total) by mouth every 7 (seven) days. 03/02/21  Yes Georgian Co M, PA-C  clobetasol ointment (TEMOVATE) 0.05 % Apply 1 application topically 2 (two) times daily. Patient not taking: Reported on 02/24/2021 01/31/21   Alicia Amel, MD  predniSONE (DELTASONE) 10 MG tablet Take 4 tablets daily for 5 days then stop 01/10/21   Storm Frisk, MD  famotidine (PEPCID) 20 MG tablet Take 1 tablet (20 mg total) by mouth 2 (two) times daily. 01/03/18 06/26/19  Michela Pitcher A, PA-C  gabapentin (NEURONTIN) 100 MG capsule Take 1 capsule (100 mg total) by mouth 3 (three) times daily. 11/21/18 06/26/19  Fayrene Helper, PA-C    ROS: Neg resp Neg cardiac Neg GI Neg GU Neg MS Neg psych  Objective:   Vitals:   07/28/21 1545 07/28/21 1555  BP: (!) 156/83 (!) 148/80  Pulse: 76   SpO2: 98%   Weight: 211 lb 3.2 oz (95.8 kg)   Height: 5' (1.524 m)    Exam General appearance : Awake, alert,  not in any distress. Speech Clear. Not toxic looking HEENT: Atraumatic and Normocephalic, pupils equally reactive to light and accomodation, EOM intact B Neck: Supple, no JVD. No cervical lymphadenopathy.  Chest: Good air entry bilaterally, CTAB.  No rales/rhonchi/wheezing CVS: S1 S2 regular, no murmurs.  Extremities: B/L Lower Ext shows no edema, both legs are warm to touch Neurology: Awake alert, and oriented X 3, CN II-XII intact, Non focal.  Finger to nose/heel to shin =B Skin: No Rash  Data Review Lab Results  Component Value Date   HGBA1C 5.4 02/24/2021    Assessment & Plan   1. Left-sided headache Believe his may be a rebound HA as medications with caffeine are  helping then HA returns.  No red flags on exam.  Reviewed labs from 02/2021  2. Elevated systolic blood pressure reading without diagnosis of hypertension Check BP daily at home and record.  Bring readings to next visit.   - amLODipine (NORVASC) 5 MG tablet; Take 1 tablet (5 mg total) by mouth daily.  Dispense: 90 tablet; Refill: 0    Return in about 1 month (around 08/28/2021) for with me or PCP for elevated PCP/rebound HA.  The patient was given clear instructions to go to ER or return to medical center if symptoms don't improve, worsen or new problems develop. The patient verbalized understanding. The patient was told to call to get lab results if they haven't heard anything in the next week.      Georgian Co, PA-C [facility] [place], [place] [phone]   07/28/2021, 4:03 PM

## 2021-07-28 NOTE — Patient Instructions (Signed)
Analgesic Rebound Headache An analgesic rebound headache, sometimes called a medication overuse headache or a drug-induced headache, is a secondary disorder that is caused by the overuse of pain medicine (analgesic) to treat the original (primary) headache. Any type of primary headache can return as a rebound headache if a person regularly takes analgesics. The types of primary headaches that are commonly associated with rebound headaches include: Migraines. Headaches that are caused by tense muscles in the head and neck area (tension headaches). Headaches that develop and happen again on one side of the head and around the eye (cluster headaches). If rebound headaches continue, they can become long-term, daily headaches. What are the causes? This condition may be caused by frequent use of: Over-the-counter medicines such as aspirin, ibuprofen, and acetaminophen. Sinus-relief medicines and medicines that contain caffeine. Narcotic pain medicines such as codeine and oxycodone. Some prescription migraine medicines. What are the signs or symptoms? The symptoms of a rebound headache are the same as the symptoms of the original headache. Some of the symptoms of specific types of headaches include: Migraine headache Pulsing or throbbing pain on one or both sides of the head. Severe pain that interferes with daily activities. Pain that gets worse with physical activity. Nausea, vomiting, or both. Pain and sensitivity with exposure to bright light, loud noises, or strong smells. Visual changes. Numbness of one or both arms. Tension headache Pressure around the head. Dull, aching head pain. Pain felt over the front and sides of the head. Tenderness in the muscles of the head, neck, and shoulders. Cluster headache Severe pain that begins in or around one eye or temple. Droopy or swollen eyelid, or redness and tearing in the eye on the same side as the pain. One-sided head pain. Nausea. Runny  nose. Sweaty, pale facial skin. Restlessness. How is this diagnosed? This condition is diagnosed by: Reviewing your medical history. This includes the nature of your primary headaches. Reviewing the types of pain medicines that you have been using to treat your primary headaches and how often you take them. How is this treated? This condition may be treated or managed by: Discontinuing frequent use of the analgesic medicine. Doing this may worsen your headaches at first, but the pain should eventually become more manageable, less frequent, and less severe. Seeing a headache specialist. He or she may be able to help you manage your headaches and help make sure there is not another cause of the headaches. Using methods of stress relief, such as acupuncture, counseling, biofeedback, and massage. Talk with your health care provider about which methods might be good for you. Follow these instructions at home: Medicines  Take over-the-counter and prescription medicines only as told by your health care provider. Stop the repeated use of pain medicine as told by your health care provider. Stopping can be difficult. Carefully follow instructions from your health care provider. Lifestyle  Follow a regular sleep schedule. Do not vary the time that you go to bed or the amount that you sleep from day to day. It is important to stay on the same schedule to help prevent headaches. Get 7-9 hours of sleep each night, or the amount recommended by your health care provider. Exercise regularly. Exercise for at least 30 minutes, 5 times each week. Limit or manage stress. Consider stress-relief options such as acupuncture, counseling, biofeedback, and massage. Talk with your health care provider about which methods might be good for you. Do not drink alcohol. Do not use any products that contain nicotine   or tobacco, such as cigarettes, e-cigarettes, and chewing tobacco. If you need help quitting, ask your health  care provider. General instructions Avoid triggers that are known to cause your primary headaches. Keep all follow-up visits as told by your health care provider. This is important. Contact a health care provider if: You continue to experience headaches after following treatments that your health care provider recommended. Get help right away if you have: New headache pain. Headache pain that is different than what you have experienced in the past. Numbness or tingling in your arms or legs. Changes in your speech or vision. Summary An analgesic rebound headache, sometimes called a medication overuse headache or a drug-induced headache, is caused by the overuse of pain medicine (analgesic) to treat the original (primary) headache. Any type of primary headache can return as a rebound headache if a person regularly takes analgesics. The types of primary headaches that are commonly associated with rebound headaches include migraines, tension headaches, and cluster headaches. Analgesic rebound headaches can occur with frequent use of over-the-counter medicines and prescription medicines. Treatment involves stopping the medicine that is being overused. This will improve headache frequency and severity. This information is not intended to replace advice given to you by your health care provider. Make sure you discuss any questions you have with your health care provider. Document Revised: 01/16/2019 Document Reviewed: 01/16/2019 Elsevier Patient Education  2023 [employer] Inc[employer]  

## 2021-07-29 ENCOUNTER — Other Ambulatory Visit: Payer: Self-pay

## 2021-08-04 ENCOUNTER — Other Ambulatory Visit: Payer: Self-pay

## 2021-08-05 ENCOUNTER — Other Ambulatory Visit: Payer: Self-pay

## 2021-08-08 ENCOUNTER — Encounter: Payer: Self-pay | Admitting: Critical Care Medicine

## 2021-08-08 ENCOUNTER — Ambulatory Visit: Payer: Self-pay

## 2021-08-08 ENCOUNTER — Telehealth: Payer: Self-pay | Admitting: Critical Care Medicine

## 2021-08-08 ENCOUNTER — Telehealth (HOSPITAL_BASED_OUTPATIENT_CLINIC_OR_DEPARTMENT_OTHER): Payer: Medicaid Other | Admitting: Critical Care Medicine

## 2021-08-08 DIAGNOSIS — I10 Essential (primary) hypertension: Secondary | ICD-10-CM

## 2021-08-08 DIAGNOSIS — R5383 Other fatigue: Secondary | ICD-10-CM

## 2021-08-08 DIAGNOSIS — E559 Vitamin D deficiency, unspecified: Secondary | ICD-10-CM

## 2021-08-08 DIAGNOSIS — F411 Generalized anxiety disorder: Secondary | ICD-10-CM

## 2021-08-08 DIAGNOSIS — F331 Major depressive disorder, recurrent, moderate: Secondary | ICD-10-CM

## 2021-08-08 NOTE — Patient Instructions (Signed)
Stay on amlodipine  Stay on Paxil  Go to [facility] behavioral health for walk in clinic new patient assessment as below:   [facility] [address] [place], [place] [address] [phone] or [phone] Walk-in urgent care 24/7 for anyone  For [facility] ONLY New patient assessments and therapy walk-ins: Monday and Wednesday 8am-11am First and second Friday 1pm-5pm New patient psychiatry and medication management walk-ins: Mondays, Wednesdays, Thursdays, Fridays 8am-11am No psychiatry walk-ins on Tuesday

## 2021-08-08 NOTE — Progress Notes (Signed)
Acute Office Visit  Subjective:     Patient ID: [name] [name], female    DOB: [birth year], 36 y.o.   MRN: [id number]  Virtual Visit via Video Note  I connected [email or web address] on 08/08/21 [email or web address] by a video enabled telemedicine application and verified that I am speaking with the correct person using two identifiers.   Consent:  I discussed the limitations, risks, security and privacy concerns of performing an evaluation and management service by video visit and the availability of in person appointments. I also discussed with the patient that there may be a patient responsible charge related to this service. The patient expressed understanding and agreed to proceed.  Location of patient: Patient is at home  Location of provider: I am in my office  Persons participating in the televisit with the patient.   No one else on the call Chief complaint of increased depression   HPI Patient is in today for patient is seen by way of a video visit for depression.  Patient states she has had worsening depression over the past month.  She was having headaches and was seen recently started on amlodipine 5 mg daily for elevated blood pressure.  Blood pressure now is in the 120 over 70s.  Patient states she is on the Paxil for anxiety this works well.  However her depression is gotten worse.  She does not drink alcohol smoke or use substances.  She needs follow-up labs.  She works Education officer, environmental houses.  She is sleeping well there is no disruptions there.  The patient is not suicidal   Review of Systems  Constitutional:  Negative for chills, diaphoresis, fever, malaise/fatigue and weight loss.  HENT:  Negative for congestion, hearing loss, nosebleeds, sore throat and tinnitus.   Eyes:  Negative for blurred vision, photophobia and redness.  Respiratory:  Negative for cough, hemoptysis, sputum production, shortness of breath, wheezing and stridor.   Cardiovascular:  Negative for chest pain,  palpitations, orthopnea, claudication, leg swelling and PND.  Gastrointestinal:  Negative for abdominal pain, blood in stool, constipation, diarrhea, heartburn, nausea and vomiting.  Genitourinary:  Negative for dysuria, flank pain, frequency, hematuria and urgency.  Musculoskeletal:  Negative for back pain, falls, joint pain, myalgias and neck pain.  Skin:  Negative for itching and rash.  Neurological:  Negative for dizziness, tingling, tremors, sensory change, speech change, focal weakness, seizures, loss of consciousness, weakness and headaches.  Endo/Heme/Allergies:  Negative for environmental allergies and polydipsia. Does not bruise/bleed easily.  Psychiatric/Behavioral:  Positive for depression. Negative for hallucinations, memory loss, substance abuse and suicidal ideas. The patient is not nervous/anxious and does not have insomnia.        Lost all interest        Objective:    There were no vitals taken for this visit. BP Readings from Last 3 Encounters:  07/28/21 (!) 148/80  02/24/21 135/72  01/27/21 (!) 144/86   Wt Readings from Last 3 Encounters:  07/28/21 211 lb 3.2 oz (95.8 kg)  04/04/21 200 lb (90.7 kg)  02/24/21 211 lb (95.7 kg)      Physical Exam No exam this is a video visit the patient is in no distress on the video No results found for any visits on 08/08/21.      Assessment & Plan:   Problem List Items Addressed This Visit   None Visit Diagnoses     Primary hypertension    -  Primary   Relevant Orders   Comprehensive metabolic panel  CBC with Differential/Platelet   Other fatigue       Relevant Orders   CBC with Differential/Platelet   Thyroid Panel With TSH   Vitamin D deficiency       Relevant Orders   VITAMIN D 25 Hydroxy (Vit-D Deficiency, Fractures)       No orders of the defined types were placed in this encounter.      Follow Up Instructions:  Patient knows and exam will occur in the next month she will also be contacted by  licensed clinical social work and furl to [facility] made I discussed the assessment and treatment plan with the patient. The patient was provided an opportunity to ask questions and all were answered. The patient agreed with the plan and demonstrated an understanding of the instructions.   The patient was advised to call back or seek an in-person evaluation if the symptoms worsen or if the condition fails to improve as anticipated.  I provided 25 minutes of non-face-to-face time during this encounter  including  median intraservice time , review of notes, labs, imaging, medications  and explaining diagnosis and management to the patient .    Shan Levans, MD

## 2021-08-08 NOTE — Telephone Encounter (Signed)
Pt needs urgent OV with you for depression counseling failing rx with SSRI  referred to [facility]  no insurance

## 2021-08-08 NOTE — Assessment & Plan Note (Signed)
No change in Paxil dosing

## 2021-08-08 NOTE — Telephone Encounter (Signed)
  Chief Complaint: depression sx worse last week Symptoms: anxiety. Loss of interest in activities, in bed all of last week Frequency: last week  Pertinent Negatives: Patient denies SI, HI Disposition: [] ED /[] Urgent Care (no appt availability in office) / [x] Appointment(In office/virtual)/ []  [place] [facility]/ [] Home Care/ [] Refused Recommended Disposition /[] [facility] [facility]/ []  Follow-up with PCP Additional Notes: pt asked if related to recently starting amlodipine. Reason for Disposition  Symptoms interfere with work or school  Answer Assessment - Initial Assessment Questions 1. CONCERN: "What happened that made you call today?"     Depression worse last week 2. DEPRESSION SYMPTOM SCREENING: "How are you feeling overall?" (e.g., decreased energy, increased sleeping or difficulty sleeping, difficulty concentrating, feelings of sadness, guilt, hopelessness, or worthlessness)     Anxiety- no energy, loss of interest  3. RISK OF HARM - SUICIDAL IDEATION:  "Do you ever have thoughts of hurting or killing yourself?"  (e.g., yes, no, no but preoccupation with thoughts about death)   - INTENT:  "Do you have thoughts of hurting or killing yourself right NOW?" (e.g., yes, no, N/A)   - PLAN: "Do you have a specific plan for how you would do this?" (e.g., gun, knife, overdose, no plan, N/A)     no 4. RISK OF HARM - HOMICIDAL IDEATION:  "Do you ever have thoughts of hurting or killing someone else?"  (e.g., yes, no, no but preoccupation with thoughts about death)   - INTENT:  "Do you have thoughts of hurting or killing someone right NOW?" (e.g., yes, no, N/A)   - PLAN: "Do you have a specific plan for how you would do this?" (e.g., gun, knife, no plan, N/A)      no 5. FUNCTIONAL IMPAIRMENT: "How have things been going for you overall? Have you had more difficulty than usual doing your normal daily activities?"  (e.g., better, same, worse; self-care, school, work, interactions)     Better  before started amlodipine 07/29/21 6. SUPPORT: "Who is with you now?" "Who do you live with?" "Do you have family or friends who you can talk to?"      home 7. THERAPIST: "Do you have a counselor or therapist? Name?"     no 8. STRESSORS: "Has there been any new stress or recent changes in your life?"     no 9. ALCOHOL USE OR SUBSTANCE USE (DRUG USE): "Do you drink alcohol or use any illegal drugs?"     socially 10. OTHER: "Do you have any other physical symptoms right now?" (e.g., fever)       fatigued 11. PREGNANCY: "Is there any chance you are pregnant?" "When was your last menstrual period?"       N/a  Protocols used: Depression-A-AH

## 2021-08-08 NOTE — Assessment & Plan Note (Signed)
Plan to refer to licensed clinical social work and also refer to [facility] behavioral health  Continue Paxil for now

## 2021-08-12 NOTE — Telephone Encounter (Signed)
[facility] contacted pt due to a referral sent by PCP. Patient has been depressed more lately but has had this issue for years. Patient doesn't have motivation to do anything and hasn't found the right medication for her depression. Patient will benefit from Indvidual therapy. [facility] completed and sent a referral to [facility] for ongoing therapy and support. Pt was advised to call [facility] if she hasn't heard back in two weeks. [facility] will also follow up.

## 2021-09-28 ENCOUNTER — Ambulatory Visit: Payer: Commercial Managed Care - HMO | Attending: Critical Care Medicine | Admitting: Critical Care Medicine

## 2021-09-28 NOTE — Progress Notes (Deleted)
   Acute Office Visit  Subjective:     Patient ID: [name] [name], female    DOB: 1985-06-23, 36 y.o.   MRN: [id number]   HPI 08/08/21 Patient is in today for patient is seen by way of a video visit for depression.  Patient states she has had worsening depression over the past month.  She was having headaches and was seen recently started on amlodipine 5 mg daily for elevated blood pressure.  Blood pressure now is in the 120 over 70s.  Patient states she is on the Paxil for anxiety this works well.  However her depression is gotten worse.  She does not drink alcohol smoke or use substances.  She needs follow-up labs.  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  She is sleeping well there is no disruptions there.  The patient is not suicidal  9/27  Review of Systems  Constitutional:  Negative for chills, diaphoresis, fever, malaise/fatigue and weight loss.  HENT:  Negative for congestion, hearing loss, nosebleeds, sore throat and tinnitus.   Eyes:  Negative for blurred vision, photophobia and redness.  Respiratory:  Negative for cough, hemoptysis, sputum production, shortness of breath, wheezing and stridor.   Cardiovascular:  Negative for chest pain, palpitations, orthopnea, claudication, leg swelling and PND.  Gastrointestinal:  Negative for abdominal pain, blood in stool, constipation, diarrhea, heartburn, nausea and vomiting.  Genitourinary:  Negative for dysuria, flank pain, frequency, hematuria and urgency.  Musculoskeletal:  Negative for back pain, falls, joint pain, myalgias and neck pain.  Skin:  Negative for itching and rash.  Neurological:  Negative for dizziness, tingling, tremors, sensory change, speech change, focal weakness, seizures, loss of consciousness, weakness and headaches.  Endo/Heme/Allergies:  Negative for environmental allergies and polydipsia. Does not bruise/bleed easily.  Psychiatric/Behavioral:  Positive for depression. Negative for hallucinations, memory loss, substance  abuse and suicidal ideas. The patient is not nervous/anxious and does not have insomnia.        Lost all interest        Objective:    There were no vitals taken for this visit. BP Readings from Last 3 Encounters:  07/28/21 (!) 148/80  02/24/21 135/72  01/27/21 (!) 144/86   Wt Readings from Last 3 Encounters:  07/28/21 211 lb 3.2 oz (95.8 kg)  04/04/21 200 lb (90.7 kg)  02/24/21 211 lb (95.7 kg)      Physical Exam No exam this is a video visit the patient is in no distress on the video No results found for any visits on 09/28/21.      Assessment & Plan:   Problem List Items Addressed This Visit   None  No orders of the defined types were placed in this encounter.     Asencion Noble, MD

## 2021-10-03 ENCOUNTER — Other Ambulatory Visit (HOSPITAL_COMMUNITY): Payer: Self-pay

## 2021-11-16 ENCOUNTER — Other Ambulatory Visit: Payer: Self-pay | Admitting: Physician Assistant

## 2021-11-16 ENCOUNTER — Other Ambulatory Visit: Payer: Self-pay | Admitting: Critical Care Medicine

## 2021-11-16 ENCOUNTER — Other Ambulatory Visit (HOSPITAL_COMMUNITY): Payer: Self-pay

## 2021-11-16 DIAGNOSIS — J4540 Moderate persistent asthma, uncomplicated: Secondary | ICD-10-CM

## 2021-11-16 DIAGNOSIS — R030 Elevated blood-pressure reading, without diagnosis of hypertension: Secondary | ICD-10-CM

## 2021-11-16 DIAGNOSIS — L309 Dermatitis, unspecified: Secondary | ICD-10-CM

## 2021-11-16 DIAGNOSIS — F411 Generalized anxiety disorder: Secondary | ICD-10-CM

## 2021-11-16 MED ORDER — PAROXETINE HCL 40 MG PO TABS
40.0000 mg | ORAL_TABLET | Freq: Every day | ORAL | 0 refills | Status: DC
  Filled 2021-11-16: qty 30, 30d supply, fill #0

## 2021-11-16 MED ORDER — HYDROXYZINE HCL 50 MG PO TABS
50.0000 mg | ORAL_TABLET | Freq: Three times a day (TID) | ORAL | 0 refills | Status: DC | PRN
  Filled 2021-11-16: qty 90, 30d supply, fill #0

## 2021-11-16 MED ORDER — AMLODIPINE BESYLATE 5 MG PO TABS
5.0000 mg | ORAL_TABLET | Freq: Every day | ORAL | 0 refills | Status: DC
  Filled 2021-11-16: qty 30, 30d supply, fill #0

## 2021-11-16 MED ORDER — ALBUTEROL SULFATE HFA 108 (90 BASE) MCG/ACT IN AERS
2.0000 | INHALATION_SPRAY | Freq: Four times a day (QID) | RESPIRATORY_TRACT | 0 refills | Status: DC | PRN
  Filled 2021-11-16: qty 6.7, 25d supply, fill #0

## 2021-12-07 ENCOUNTER — Ambulatory Visit
Admission: EM | Admit: 2021-12-07 | Discharge: 2021-12-07 | Disposition: A | Discharge status: Home / Self Care | Payer: Commercial Managed Care - HMO | Attending: Internal Medicine | Admitting: Internal Medicine

## 2021-12-07 ENCOUNTER — Encounter: Payer: Self-pay | Admitting: Emergency Medicine

## 2021-12-07 DIAGNOSIS — J029 Acute pharyngitis, unspecified: Secondary | ICD-10-CM | POA: Diagnosis present

## 2021-12-07 DIAGNOSIS — B349 Viral infection, unspecified: Secondary | ICD-10-CM | POA: Diagnosis present

## 2021-12-07 LAB — POCT RAPID STREP A (OFFICE): Rapid Strep A Screen: NEGATIVE

## 2021-12-07 MED ORDER — ONDANSETRON 4 MG PO TBDP: 4.0000 mg | ORAL_TABLET | Freq: Three times a day (TID) | ORAL | 0 refills | Status: DC | PRN

## 2021-12-07 NOTE — Discharge Instructions (Signed)
It appears that you have a viral illness which should run its course and self resolve with symptomatic treatment as we discussed.  I have prescribed you a nausea medication to take as needed.  Please ensure adequate fluid hydration with clear oral fluids including Gatorade, water, Pedialyte.  Ensure a bland diet.  See attached instructions.  Follow-up with the ER if symptoms persist or worsen or if you are unable to keep any food or fluids down despite nausea medication.

## 2021-12-07 NOTE — ED Triage Notes (Signed)
Pt is present today with abdominal pain, back pain, vomiting, and diarrhea x3 days ago.

## 2021-12-07 NOTE — ED Provider Notes (Addendum)
[facility]    CSN: [id number] Arrival date & time: 12/07/21  4481      History   Chief Complaint Chief Complaint  Patient presents with   Abdominal Pain   Emesis   Diarrhea   Back Pain    HPI [name] [name] is a 36 y.o. female.   Patient presents with abdominal pain, nausea, vomiting, diarrhea that started 3 days ago.  Patient denies any blood in emesis or stool.  Patient reports that she has some lower abdominal cramping that radiates around from her back into her bilateral lower abdomen.  Reports that she has felt feverish but denies any documented fevers.  She also reports that she had some nasal congestion, cough, sore throat that started last week.  Upper respiratory symptoms and cough have now resolved but she is left with a mild sore throat.  Denies any known sick contacts.  Denies any recent unfavorable foods or travel outside the [place].  Denies any associated dysuria, urinary frequency, hematuria.  Patient is not sure of last menstrual cycle as she "does not keep up with it".  Although, denies concern for pregnancy given history of tubal ligation.  Patient has mildly elevated blood pressure reading but reports that she has not taken her blood pressure medication today.  Denies headache, blurred vision, dizziness, chest pain, shortness of breath.   Abdominal Pain Emesis Diarrhea Back Pain   Past Medical History:  Diagnosis Date   Admission for sterilization 06/12/2013   Asthma    Breast abscess    Depression    HX of - not on med n, doing well.   Eczema    Family history of anesthesia complication    Grandmother has difficulty waskin up.   Pneumonia    HX only -years ago has had a few times.   Pregnant state, incidental    S/P tubal ligation 06/13/2013   SVD (spontaneous vaginal delivery)    x 2    Patient Active Problem List   Diagnosis Date Noted   Weight gain 04/04/2021   Women's annual routine gynecological examination  06/10/2020   Screening examination for STD (sexually transmitted disease) 06/10/2020   Former tobacco use 12/07/2014   Major depressive disorder, recurrent episode, moderate (HCC) 01/13/2014   GAD (generalized anxiety disorder) 01/13/2014   S/P tubal ligation 06/13/2013   Allergic conjunctivitis of both eyes and rhinitis 10/14/2012   Asthma 03/01/2006   Eczema 03/01/2006    Past Surgical History:  Procedure Laterality Date   INCISE AND DRAIN ABCESS     left breast x 2   INCISION AND DRAINAGE ABSCESS Left 02/17/2012   Procedure:  DRAINAGEof recurrent breast abscess;  Surgeon: Shelly Rubenstein, MD;  Location: [facility] OR;  Service: General;  Laterality: Left;   LAPAROSCOPIC TUBAL LIGATION Bilateral 06/13/2013   Procedure: LAPAROSCOPIC TUBAL LIGATION;  Surgeon: Sherian Rein, MD;  Location: [facility];  Service: Gynecology;  Laterality: Bilateral;   TUBAL LIGATION      OB History     Gravida  2   Para  2   Term  1   Preterm  1   AB      Living  2      SAB      IAB      Ectopic      Multiple      Live Births  2            Home Medications    Prior to Admission medications  Medication Sig Start Date End Date Taking? Authorizing Provider  ondansetron (ZOFRAN-ODT) 4 MG disintegrating tablet Take 1 tablet (4 mg total) by mouth every 8 (eight) hours as needed for nausea or vomiting. 12/07/21  Yes , Acie Fredrickson, FNP  albuterol (VENTOLIN HFA) 108 (90 Base) MCG/ACT inhaler Inhale 2 puffs into the lungs every 6 (six) hours as needed for wheezing or shortness of breath. 11/16/21   Storm Frisk, MD  amLODipine (NORVASC) 5 MG tablet Take 1 tablet (5 mg total) by mouth daily. 11/16/21   Storm Frisk, MD  cetirizine (ZYRTEC) 10 MG tablet TAKE 1 TABLET (10 MG TOTAL) BY MOUTH AT BEDTIME. 12/29/20 12/29/21  Storm Frisk, MD  hydrOXYzine (ATARAX) 50 MG tablet Take 1 tablet (50 mg total) by mouth every 8 (eight) hours as needed for anxiety or itching. 11/16/21  11/16/22  Storm Frisk, MD  PARoxetine (PAXIL) 40 MG tablet Take 1 tablet (40 mg total) by mouth daily. 11/16/21   Storm Frisk, MD  Vitamin D, Ergocalciferol, (DRISDOL) 1.25 MG (50000 UNIT) CAPS capsule Take 1 capsule (50,000 Units total) by mouth every 7 (seven) days. 03/02/21   Anders Simmonds, PA-C  famotidine (PEPCID) 20 MG tablet Take 1 tablet (20 mg total) by mouth 2 (two) times daily. 01/03/18 06/26/19  Michela Pitcher A, PA-C  gabapentin (NEURONTIN) 100 MG capsule Take 1 capsule (100 mg total) by mouth 3 (three) times daily. 11/21/18 06/26/19  Fayrene Helper, PA-C    Family History Family History  Problem Relation Age of Onset   Healthy Mother    Anxiety disorder Mother    Healthy Father    Stroke Maternal Grandmother    Stroke Paternal Grandfather    Bipolar disorder Neg Hx    Depression Neg Hx     Social History Social History   Tobacco Use   Smoking status: Former    Packs/day: 1.00    Years: 5.00    Total pack years: 5.00    Types: Cigarettes    Quit date: 2021    Years since quitting: 2.9   Smokeless tobacco: Never  Vaping Use   Vaping Use: Every day  Substance Use Topics   Alcohol use: Yes    Comment: occasional   Drug use: No     Allergies   Doxycycline   Review of Systems Review of Systems Per HPI  Physical Exam Triage Vital Signs ED Triage Vitals  Enc Vitals Group     BP 12/07/21 0925 (!) 167/84     Pulse Rate 12/07/21 0925 79     Resp 12/07/21 0925 18     Temp 12/07/21 0925 98.1 F (36.7 C)     Temp src --      SpO2 12/07/21 0925 98 %     Weight --      Height --      Head Circumference --      Peak Flow --      Pain Score 12/07/21 0923 6     Pain Loc --      Pain Edu? --      Excl. in GC? --    No data found.  Updated Vital Signs BP (!) 167/84   Pulse 79   Temp 98.1 F (36.7 C)   Resp 18   SpO2 98%   Visual Acuity Right Eye Distance:   Left Eye Distance:   Bilateral Distance:    Right Eye Near:   Left Eye Near:  Bilateral Near:     Physical Exam Constitutional:      General: She is not in acute distress.    Appearance: Normal appearance. She is not toxic-appearing or diaphoretic.  HENT:     Head: Normocephalic and atraumatic.     Mouth/Throat:     Mouth: Mucous membranes are moist.     Pharynx: Posterior oropharyngeal erythema present. No pharyngeal swelling, oropharyngeal exudate or uvula swelling.     Tonsils: No tonsillar exudate or tonsillar abscesses.  Eyes:     Extraocular Movements: Extraocular movements intact.     Conjunctiva/sclera: Conjunctivae normal.  Cardiovascular:     Rate and Rhythm: Normal rate and regular rhythm.     Pulses: Normal pulses.     Heart sounds: Normal heart sounds.  Pulmonary:     Effort: Pulmonary effort is normal. No respiratory distress.     Breath sounds: Normal breath sounds.  Abdominal:     General: Bowel sounds are normal. There is no distension.     Palpations: Abdomen is soft.     Tenderness: There is no abdominal tenderness.  Skin:    General: Skin is warm and dry.  Neurological:     General: No focal deficit present.     Mental Status: She is alert and oriented to person, place, and time. Mental status is at baseline.  Psychiatric:        Mood and Affect: Mood normal.        Behavior: Behavior normal.        Thought Content: Thought content normal.        Judgment: Judgment normal.      UC Treatments / Results  Labs (all labs ordered are listed, but only abnormal results are displayed) Labs Reviewed  CULTURE, GROUP A STREP [facility])  POCT RAPID STREP A (OFFICE)    EKG   Radiology No results found.  Procedures Procedures (including critical care time)  Medications Ordered in UC Medications - No data to display  Initial Impression / Assessment and Plan / UC Course  I have reviewed the triage vital signs and the nursing notes.  Pertinent labs & imaging results that were available during my care of the patient were reviewed  by me and considered in my medical decision making (see chart for details).     Rapid strep completed given persistent sore throat and associated gastrointestinal symptoms which can be concerning for strep.  It was negative.  Throat culture is pending.  Suspect viral illness as cause of symptoms.  No signs of acute abdomen or dehydration on exam, therefore, do not think that emergent evaluation is necessary at this time.  Will prescribe ondansetron to take as needed.  Patient advised to ensure adequate fluid hydration and bland diet.  UA deferred given no associated urinary symptoms.  Urine pregnancy deferred given history of tubal ligation.  Patient has mildly elevated blood pressure reading but states that she has not taken her blood pressure medication today.  Advised to restart blood pressure medication at previously prescribed dose.  No signs of hypertensive urgency on exam and patient is asymptomatic regarding BP.  Therefore, no need for emergent evaluation. Discussed return and ER precautions.  Patient verbalized understanding and was agreeable with plan.  Final Clinical Impressions(s) / UC Diagnoses   Final diagnoses:  Viral illness  Sore throat     Discharge Instructions      It appears that you have a viral illness which should run its course and self  resolve with symptomatic treatment as we discussed.  I have prescribed you a nausea medication to take as needed.  Please ensure adequate fluid hydration with clear oral fluids including Gatorade, water, Pedialyte.  Ensure a bland diet.  See attached instructions.  Follow-up with the ER if symptoms persist or worsen or if you are unable to keep any food or fluids down despite nausea medication.    ED Prescriptions     Medication Sig Dispense Auth. Provider   ondansetron (ZOFRAN-ODT) 4 MG disintegrating tablet Take 1 tablet (4 mg total) by mouth every 8 (eight) hours as needed for nausea or vomiting. 20 tablet Guys, Acie Fredrickson, [place]       PDMP not reviewed this encounter.   Gustavus Bryant, [place] 12/07/21 1019    Gustavus Bryant, [place] 12/07/21 1020

## 2021-12-10 LAB — CULTURE, GROUP A STREP (THRC)

## 2021-12-23 ENCOUNTER — Ambulatory Visit
Admission: EM | Admit: 2021-12-23 | Discharge: 2021-12-23 | Disposition: A | Discharge status: Home / Self Care | Payer: Commercial Managed Care - HMO | Attending: Physician Assistant | Admitting: Physician Assistant

## 2021-12-23 DIAGNOSIS — L309 Dermatitis, unspecified: Secondary | ICD-10-CM

## 2021-12-23 MED ORDER — METHYLPREDNISOLONE SODIUM SUCC 125 MG IJ SOLR
60.0000 mg | Freq: Once | INTRAMUSCULAR | Status: AC
  Administered 2021-12-23: 60 mg via INTRAMUSCULAR

## 2021-12-23 MED ORDER — PREDNISONE 20 MG PO TABS: 40.0000 mg | ORAL_TABLET | Freq: Every day | ORAL | 0 refills | Status: AC

## 2021-12-23 NOTE — ED Triage Notes (Signed)
[facility] with co of eczema flair up for two months pt has been using a cream but she does not remember the name no relief

## 2021-12-23 NOTE — ED Provider Notes (Signed)
[facility]    CSN: [id number] Arrival date & time: 12/23/21  1617      History   Chief Complaint Chief Complaint  Patient presents with   Rash    HPI [name] [name] is a 36 y.o. female.   Patient here today for evaluation of eczema flare.  She reports for the last 2 months she has had rash that is continued to worsen with time.  She has now developed some rash to her face and eyelids.  She notes when she was younger that eczema flares would come and go without treatment however as she has gotten older she has required oral steroids for treatment.  She notes a few years ago she was able to use a steroid cream topically and that was helpful however this also stopped being effective.  She denies any other symptoms including shortness of breath, trouble swallowing or fever.  The history is provided by the patient.    Past Medical History:  Diagnosis Date   Admission for sterilization 06/12/2013   Asthma    Breast abscess    Depression    HX of - not on med n, doing well.   Eczema    Family history of anesthesia complication    Grandmother has difficulty waskin up.   Pneumonia    HX only -years ago has had a few times.   Pregnant state, incidental    S/P tubal ligation 06/13/2013   SVD (spontaneous vaginal delivery)    x 2    Patient Active Problem List   Diagnosis Date Noted   Weight gain 04/04/2021   Women's annual routine gynecological examination 06/10/2020   Screening examination for STD (sexually transmitted disease) 06/10/2020   Former tobacco use 12/07/2014   Major depressive disorder, recurrent episode, moderate (HCC) 01/13/2014   GAD (generalized anxiety disorder) 01/13/2014   S/P tubal ligation 06/13/2013   Allergic conjunctivitis of both eyes and rhinitis 10/14/2012   Asthma 03/01/2006   Eczema 03/01/2006    Past Surgical History:  Procedure Laterality Date   INCISE AND DRAIN ABCESS     left breast x 2   INCISION AND DRAINAGE  ABSCESS Left 02/17/2012   Procedure:  DRAINAGEof recurrent breast abscess;  Surgeon: Shelly Rubenstein, MD;  Location: [facility] OR;  Service: General;  Laterality: Left;   LAPAROSCOPIC TUBAL LIGATION Bilateral 06/13/2013   Procedure: LAPAROSCOPIC TUBAL LIGATION;  Surgeon: Sherian Rein, MD;  Location: [facility];  Service: Gynecology;  Laterality: Bilateral;   TUBAL LIGATION      OB History     Gravida  2   Para  2   Term  1   Preterm  1   AB      Living  2      SAB      IAB      Ectopic      Multiple      Live Births  2            Home Medications    Prior to Admission medications   Medication Sig Start Date End Date Taking? Authorizing Provider  predniSONE (DELTASONE) 20 MG tablet Take 2 tablets (40 mg total) by mouth daily with breakfast for 5 days. 12/23/21 12/28/21 Yes Tomi Bamberger, PA-C  albuterol (VENTOLIN HFA) 108 (90 Base) MCG/ACT inhaler Inhale 2 puffs into the lungs every 6 (six) hours as needed for wheezing or shortness of breath. 11/16/21   Storm Frisk, MD  amLODipine (NORVASC) 5 MG  tablet Take 1 tablet (5 mg total) by mouth daily. 11/16/21   Storm Frisk, MD  cetirizine (ZYRTEC) 10 MG tablet TAKE 1 TABLET (10 MG TOTAL) BY MOUTH AT BEDTIME. 12/29/20 12/29/21  Storm Frisk, MD  hydrOXYzine (ATARAX) 50 MG tablet Take 1 tablet (50 mg total) by mouth every 8 (eight) hours as needed for anxiety or itching. 11/16/21 11/16/22  Storm Frisk, MD  ondansetron (ZOFRAN-ODT) 4 MG disintegrating tablet Take 1 tablet (4 mg total) by mouth every 8 (eight) hours as needed for nausea or vomiting. 12/07/21   Gustavus Bryant, FNP  PARoxetine (PAXIL) 40 MG tablet Take 1 tablet (40 mg total) by mouth daily. 11/16/21   Storm Frisk, MD  Vitamin D, Ergocalciferol, (DRISDOL) 1.25 MG (50000 UNIT) CAPS capsule Take 1 capsule (50,000 Units total) by mouth every 7 (seven) days. 03/02/21   Anders Simmonds, PA-C  famotidine (PEPCID) 20 MG tablet Take 1  tablet (20 mg total) by mouth 2 (two) times daily. 01/03/18 06/26/19  Michela Pitcher A, PA-C  gabapentin (NEURONTIN) 100 MG capsule Take 1 capsule (100 mg total) by mouth 3 (three) times daily. 11/21/18 06/26/19  Fayrene Helper, PA-C    Family History Family History  Problem Relation Age of Onset   Healthy Mother    Anxiety disorder Mother    Healthy Father    Stroke Maternal Grandmother    Stroke Paternal Grandfather    Bipolar disorder Neg Hx    Depression Neg Hx     Social History Social History   Tobacco Use   Smoking status: Former    Packs/day: 1.00    Years: 5.00    Total pack years: 5.00    Types: Cigarettes    Quit date: 2021    Years since quitting: 2.9   Smokeless tobacco: Never  Vaping Use   Vaping Use: Every day  Substance Use Topics   Alcohol use: Yes    Comment: occasional   Drug use: No     Allergies   Doxycycline   Review of Systems Review of Systems  Constitutional:  Negative for chills and fever.  HENT:  Negative for facial swelling and trouble swallowing.   Eyes:  Negative for discharge and redness.  Respiratory:  Negative for shortness of breath.   Gastrointestinal:  Negative for nausea and vomiting.  Skin:  Positive for rash.     Physical Exam Triage Vital Signs ED Triage Vitals  Enc Vitals Group     BP      Pulse      Resp      Temp      Temp src      SpO2      Weight      Height      Head Circumference      Peak Flow      Pain Score      Pain Loc      Pain Edu?      Excl. in GC?    No data found.  Updated Vital Signs BP 126/81   Pulse 87   Temp 98 F (36.7 C)   Resp 16   SpO2 98%      Physical Exam Vitals and nursing note reviewed.  Constitutional:      General: She is not in acute distress.    Appearance: Normal appearance. She is not ill-appearing.  HENT:     Head: Normocephalic and atraumatic.  Eyes:     Conjunctiva/sclera:  Conjunctivae normal.  Cardiovascular:     Rate and Rhythm: Normal rate.  Pulmonary:      Effort: Pulmonary effort is normal.  Skin:    Findings: Rash (diffuse dry scaling, excoriated maculopapular rash to arms, chest, face (specifically eyelids bilaterally)) present.  Neurological:     Mental Status: She is alert.  Psychiatric:        Mood and Affect: Mood normal.        Behavior: Behavior normal.        Thought Content: Thought content normal.      UC Treatments / Results  Labs (all labs ordered are listed, but only abnormal results are displayed) Labs Reviewed - No data to display  EKG   Radiology No results found.  Procedures Procedures (including critical care time)  Medications Ordered in UC Medications  methylPREDNISolone sodium succinate (SOLU-MEDROL) 125 mg/2 mL injection 60 mg (60 mg Intramuscular Given 12/23/21 1752)    Initial Impression / Assessment and Plan / UC Course  I have reviewed the triage vital signs and the nursing notes.  Pertinent labs & imaging results that were available during my care of the patient were reviewed by me and considered in my medical decision making (see chart for details).    Steroid injection administered in office and prednisone prescribed to start tomorrow.  Recommended follow-up if no gradual improvement or with any further concerns.  Final Clinical Impressions(s) / UC Diagnoses   Final diagnoses:  Eczema, unspecified type   Discharge Instructions   None    ED Prescriptions     Medication Sig Dispense Auth. Provider   predniSONE (DELTASONE) 20 MG tablet Take 2 tablets (40 mg total) by mouth daily with breakfast for 5 days. 10 tablet Tomi Bamberger, PA-C      PDMP not reviewed this encounter.   Tomi Bamberger, PA-C 12/23/21 1842

## 2022-01-13 ENCOUNTER — Ambulatory Visit (INDEPENDENT_AMBULATORY_CARE_PROVIDER_SITE_OTHER): Payer: Commercial Managed Care - HMO | Admitting: Internal Medicine

## 2022-01-13 ENCOUNTER — Encounter: Payer: Self-pay | Admitting: Internal Medicine

## 2022-01-13 ENCOUNTER — Other Ambulatory Visit: Payer: Self-pay

## 2022-01-13 VITALS — BP 126/82 | HR 89 | Temp 98.6°F | Resp 16 | Ht 60.0 in | Wt 215.8 lb

## 2022-01-13 DIAGNOSIS — L2084 Intrinsic (allergic) eczema: Secondary | ICD-10-CM

## 2022-01-13 DIAGNOSIS — J3089 Other allergic rhinitis: Secondary | ICD-10-CM | POA: Diagnosis not present

## 2022-01-13 DIAGNOSIS — L209 Atopic dermatitis, unspecified: Secondary | ICD-10-CM

## 2022-01-13 DIAGNOSIS — J4540 Moderate persistent asthma, uncomplicated: Secondary | ICD-10-CM

## 2022-01-13 DIAGNOSIS — L089 Local infection of the skin and subcutaneous tissue, unspecified: Secondary | ICD-10-CM

## 2022-01-13 DIAGNOSIS — H1045 Other chronic allergic conjunctivitis: Secondary | ICD-10-CM

## 2022-01-13 DIAGNOSIS — Z720 Tobacco use: Secondary | ICD-10-CM

## 2022-01-13 MED ORDER — SULFAMETHOXAZOLE-TRIMETHOPRIM 800-160 MG PO TABS: 1.0000 | ORAL_TABLET | Freq: Two times a day (BID) | ORAL | 0 refills | Status: AC

## 2022-01-13 MED ORDER — MONTELUKAST SODIUM 10 MG PO TABS: 10.0000 mg | ORAL_TABLET | Freq: Every day | ORAL | 5 refills | Status: DC

## 2022-01-13 MED ORDER — DUPILUMAB 300 MG/2ML ~~LOC~~ SOSY
600.0000 mg | PREFILLED_SYRINGE | Freq: Once | SUBCUTANEOUS | Status: AC
  Administered 2022-01-13: 600 mg via SUBCUTANEOUS

## 2022-01-13 MED ORDER — BUDESONIDE-FORMOTEROL FUMARATE 160-4.5 MCG/ACT IN AERO: 2.0000 | INHALATION_SPRAY | Freq: Two times a day (BID) | RESPIRATORY_TRACT | 5 refills | Status: DC

## 2022-01-13 MED ORDER — CLOBETASOL PROPIONATE 0.05 % EX OINT: TOPICAL_OINTMENT | CUTANEOUS | 3 refills | Status: DC

## 2022-01-13 MED ORDER — FLUTICASONE PROPIONATE 50 MCG/ACT NA SUSP: 2.0000 | Freq: Every day | NASAL | 2 refills | Status: DC

## 2022-01-13 MED ORDER — TACROLIMUS 0.1 % EX OINT: TOPICAL_OINTMENT | Freq: Two times a day (BID) | CUTANEOUS | 3 refills | Status: DC

## 2022-01-13 MED ORDER — TRIAMCINOLONE ACETONIDE 0.1 % EX OINT: 1.0000 | TOPICAL_OINTMENT | Freq: Two times a day (BID) | CUTANEOUS | 3 refills | Status: DC

## 2022-01-13 MED ORDER — LEVOCETIRIZINE DIHYDROCHLORIDE 5 MG PO TABS: 5.0000 mg | ORAL_TABLET | Freq: Every evening | ORAL | 5 refills | Status: DC

## 2022-01-13 NOTE — Patient Instructions (Addendum)
Skin testing deferred due to severity of eczema will get labs instead for patient's multiple atopic diseases  Atopic Dermatitis: severe not well controlled  Daily Care For Maintenance (daily and continue even once eczema controlled) - Recommend hypoallergenic hydrating ointment at least twice daily.  This must be done daily for control of flares. (Great options include Vaseline, CeraVe, Aquaphor, Aveeno, Cetaphil, VaniCream, etc) - Recommend avoiding detergents, soaps or lotions with fragrances/dyes, and instead using products which are hypoallergenic, use second rinse cycle when washing clothes -Wear lose breathable clothing, avoid wool -Avoid extremes of humidity - Limit showers/baths to 5 minutes and use luke warm water instead of hot, pat dry following baths, and apply moisturizer - can use steroid creams as detailed below up to twice weekly for prevention of flares. -START Dupixent 300mg  every 2 weeks, loading dose and consent given and obtained today  - Tammy our biologic coordinator will call you regarding approval  - Start Bactrim twice a day for 5 days for secondary skin infection   -we will need to do patch testing in the future for possible underlying contact dermatitis but eczema needs to be better controlled first  -May consider allergy injections AKA allergy shots would like to see how Dupixent works first.  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 - Start Singulair (Montelukast) 10mg  nightly. - Continue over the counter antihistamine daily or daily as needed.   -Your options include Zyrtec (Cetirizine) 10mg , Claritin (Loratadine) 10mg , Allegra (Fexofenadine) 180mg , or Xyzal (Levocetirinze) 5mg   Allergic Conjunctivitis:  - Start Allergy Eye drops: great options include Pataday (Olopatadine) or Zaditor (ketotifen) for eye symptoms daily as needed-both sold over the counter if not covered by insurance.   -Avoid eye drops that say red eye relief as they may contain medications that dry out your eyes.  Moderate Persistent  Asthma:   not well controlled - Breathing test today showed: Inflammation in your lungs that is not fully reversible  PLAN:  - Spacer sample and demonstration provided. - Daily controller medication(s): Singulair 10mg  daily and Symbicort 160/4.49mcg two puffs twice daily with spacer - Prior to physical activity: albuterol 2 puffs 10-15 minutes before physical activity. - Rescue medications: albuterol 4 puffs every 4-6 hours as needed - Changes during respiratory infections or worsening symptoms: Increase Symbicort  to 4 puffs twice daily for TWO WEEKS. - Asthma control goals:  * Full participation in all desired activities (may need albuterol before activity) * Albuterol use two time or less a week on average (not counting use with activity) * Cough interfering with sleep two time or less a month * Oral steroids no more than once a year * No hospitalizations   GERD  -Uncontrolled reflux can worsen upper and lower respiratory disease -Start dietary and lifestyle modifications -May consider acid suppression medications in the future  Follow up:  4 weeks   Thank you so much for letting me partake in your care today.  Don't hesitate to reach  out if you have any additional concerns!  Roney Marion, MD  [facility] [place]

## 2022-01-13 NOTE — Progress Notes (Signed)
Started Dupixent injections today. She received the 600mg  loading dose and will continue 300mg  every 2 weeks. Consent has been signed and all information has been reviewed. No issues after observed wait time.

## 2022-01-13 NOTE — Progress Notes (Signed)
New Patient Note  RE: [name] [name] MRN: [id number] DOB: Oct 20, 1985 Date of Office Visit: 01/13/2022  Consult requested by: Storm Frisk, MD Primary care provider: Storm Frisk, MD  Chief Complaint: Eczema (Severe - all over body - prednisone for flares - no daily preventative )  History of Present Illness: I had the pleasure of seeing [name] [name] for initial evaluation at the [facility] on 01/13/2022. She is a 37 y.o. female, who is referred here by Storm Frisk, MD for the evaluation of asthma and eczema .  History obtained from patient, chart review.  Asthma History:  -Diagnosed at age at age 62.  -Current symptoms include chest tightness, cough, shortness of breath, and wheezing 2-3 daytime symptoms in past month, 4-5 nighttime awakenings in past month Using rescue inhaler daily use  -Limitations to daily activity: mild - 0 ED visits, 0 UC visits and 0 oral steroids in the past year - 7 number of lifetime hospitalizations, 0 number of lifetime intubations.  - Identified Triggers:  rhinitis , exercise, respiratory illness, smoke exposure, and cold air - Up-to-date with  none  vaccines. - History of prior pneumonias: usually gets "walking PNA" yearly, one lifetime hospitalization - History of prior COVID-19 infection: 0 - Smoking exposure: yes  Previous Diagnostics:  - Prior PFTs or spirometry: none to review  - Most Recent AEC (02/24/21): 500 -Most Recent Chest Imaging: CXR on (01/19/21: IMPRESSION: No edema or consolidation - Today's Asthma Control Test: ACT Total Score: 17.   Management:  - Previously used therapies: albuterol.  - Current regimen:  - Maintenance: none  - Rescue: Albuterol 2 puffs q4-6 hrs PRN, not using  prior to exercise  Atopic Dermatitis:  Diagnosed at age as a child, flares mostly face, arms, neck, trunk, feet. Previous therapies tried topical steroids (cannot recall names) but has tried multiple  strength  Current regimen: prednsone a few times per year    Reports use of fragrance/dye free products Identified triggers of flares include stress, sweat, changes in weather, soaps  Sleep is affected  Chronic rhinitis: started as a young child  Symptoms include:  cough , nasal congestion, rhinorrhea, post nasal drainage, sneezing, watery eyes, itchy eyes, and itchy nose  Occurs year-round with seasonal flares Potential triggers: fall season Treatments tried: OTC antihistamines and nasal sprays  Previous allergy testing: no History of reflux/heartburn: yes History of chronic sinusitis or sinus surgery: no Nonallergic triggers:  denies        Assessment and Plan: [name] [name] is a 37 y.o. female with: Intrinsic atopic dermatitis - Plan: Allergens w/Total IgE Area 2  Moderate persistent asthma without complication - Plan: Spirometry with Graph, Allergens w/Total IgE Area 2  Skin infection  Other allergic rhinitis - Plan: Allergens w/Total IgE Area 2  Other chronic allergic conjunctivitis of both eyes - Plan: Allergens w/Total IgE Area 2  Atopic dermatitis, unspecified type Plan: Patient Instructions  Skin testing deferred due to severity of eczema will get labs instead for patient's multiple atopic diseases  Atopic Dermatitis: severe not well controlled  Daily Care For Maintenance (daily and continue even once eczema controlled) - Recommend hypoallergenic hydrating ointment at least twice daily.  This must be done daily for control of flares. (Great options include Vaseline, CeraVe, Aquaphor, Aveeno, Cetaphil, VaniCream, etc) - Recommend avoiding detergents, soaps or lotions with fragrances/dyes, and instead using products which are hypoallergenic, use second rinse cycle when washing clothes -Wear lose breathable clothing, avoid wool -  Avoid extremes of humidity - Limit showers/baths to 5 minutes and use luke warm water instead of hot, pat dry following baths, and apply  moisturizer - can use steroid creams as detailed below up to twice weekly for prevention of flares. -START Dupixent 300mg  every 2 weeks, loading dose and consent given and obtained today  - [name] [name] our biologic coordinator will call you regarding approval  - Start Bactrim twice a day for 5 days for secondary skin infection   -we will need to do patch testing in the future for possible underlying contact dermatitis but eczema needs to be better controlled first  -May consider allergy injections AKA allergy shots would like to see how Dupixent works first.  For Flares:(add this to maintenance therapy if needed for flares) - Clobetasol 0.5% to body for severe flares-apply topically twice daily to red, raised, thickened areas of skin, followed by moisturizer  - Triamcinolone 0.1% to body for moderate flares-apply topically twice daily to red, raised areas of skin, followed by moisturizer - tacrolimus ointment twice a day (can be used for face and body) - Hydroxyzine 25mg  as needed for itching  Chronic  Rhinitis allergic not well controlled -sIgE lab obtained today  - Start Nasal Steroid Spray: Options include Flonase (fluticasone), Nasocort (triamcinolone), Nasonex (mometasome) 1- 2 sprays in each nostril daily (can buy over-the-counter if not covered by insurance)  Best results if used daily. - Start Singulair (Montelukast) 10mg  nightly. - Continue over the counter antihistamine daily or daily as needed.   -Your options include Zyrtec (Cetirizine) 10mg , Claritin (Loratadine) 10mg , Allegra (Fexofenadine) 180mg , or Xyzal (Levocetirinze) 5mg   Allergic Conjunctivitis:  - Start Allergy Eye drops: great options include Pataday (Olopatadine) or Zaditor (ketotifen) for eye symptoms daily as needed-both sold over the counter if not covered by insurance.   -Avoid eye drops that say red eye relief as they may contain medications that dry out your eyes.  Moderate Persistent  Asthma:   not well  controlled - Breathing test today showed: Inflammation in your lungs that is not fully reversible  PLAN:  - Spacer sample and demonstration provided. - Daily controller medication(s): Singulair 10mg  daily and Symbicort 160/4.28mcg two puffs twice daily with spacer - Prior to physical activity: albuterol 2 puffs 10-15 minutes before physical activity. - Rescue medications: albuterol 4 puffs every 4-6 hours as needed - Changes during respiratory infections or worsening symptoms: Increase Symbicort  to 4 puffs twice daily for TWO WEEKS. - Asthma control goals:  * Full participation in all desired activities (may need albuterol before activity) * Albuterol use two time or less a week on average (not counting use with activity) * Cough interfering with sleep two time or less a month * Oral steroids no more than once a year * No hospitalizations   GERD  -Uncontrolled reflux can worsen upper and lower respiratory disease -Start dietary and lifestyle modifications -May consider acid suppression medications in the future  Follow up: 4 weeks   Thank you so much for letting me partake in your care today.  Don't hesitate to reach out if you have any additional concerns!  , MD  [facility] [place]    Meds ordered this encounter  Medications   dupilumab (DUPIXENT) prefilled syringe 600 mg   fluticasone (FLONASE) 50 MCG/ACT nasal spray    Sig: Place 2 sprays into both nostrils daily.    Dispense:  16 g    Refill:  2   levocetirizine (XYZAL)  5 MG tablet    Sig: Take 1 tablet (5 mg total) by mouth every evening.    Dispense:  30 tablet    Refill:  5   montelukast (SINGULAIR) 10 MG tablet    Sig: Take 1 tablet (10 mg total) by mouth at bedtime.    Dispense:  30 tablet    Refill:  5   budesonide-formoterol (SYMBICORT) 160-4.5 MCG/ACT inhaler    Sig: Inhale 2 puffs into the lungs in the morning and at bedtime.    Dispense:  1 each    Refill:  5    clobetasol ointment (TEMOVATE) 0.05 %    Sig: Apply topically twice daily to BODY as needed for SEVERE red, sandpaper and thickened like rash.  Do not use on face, groin or armpits.  Use for up to two weeks at a time.    Dispense:  60 g    Refill:  3   triamcinolone ointment (KENALOG) 0.1 %    Sig: Apply 1 Application topically 2 (two) times daily.    Dispense:  453 g    Refill:  3   tacrolimus (PROTOPIC) 0.1 % ointment    Sig: Apply topically 2 (two) times daily.    Dispense:  100 g    Refill:  3   sulfamethoxazole-trimethoprim (BACTRIM DS) 800-160 MG tablet    Sig: Take 1 tablet by mouth 2 (two) times daily for 5 days.    Dispense:  10 tablet    Refill:  0   Lab Orders         Allergens w/Total IgE Area 2      Other allergy screening: Asthma: yes Rhino conjunctivitis: yes Food allergy: no Medication allergy: yes: doxycycline Hymenoptera allergy: no Urticaria: no Eczema:yes History of recurrent infections suggestive of immunodeficency: no  Diagnostics: Spirometry:  Tracings reviewed. Her effort: Good reproducible efforts. FVC: 2.49L FEV1: 1.92L, 71% predicted FEV1/FVC ratio: 77% Interpretation: Spirometry consistent with possible restrictive disease.  After 4 puffs of albuterol FEV1 increased by 200 cc and 10%, FVC increased by 40 cc and 1.6%.  This is not a significant postbronchodilator response Please see scanned spirometry results for details.  Skin Testing:  deferred due to severity of eczema  .  Results interpreted by myself and discussed with patient/family.   Past Medical History: Patient Active Problem List   Diagnosis Date Noted   Weight gain 04/04/2021   Women's annual routine gynecological examination 06/10/2020   Screening examination for STD (sexually transmitted disease) 06/10/2020   Former tobacco use 12/07/2014   Major depressive disorder, recurrent episode, moderate ([place]) 01/13/2014   GAD (generalized anxiety disorder) 01/13/2014   S/P tubal  ligation 06/13/2013   Allergic conjunctivitis of both eyes and rhinitis 10/14/2012   Asthma 03/01/2006   Eczema 03/01/2006   Past Medical History:  Diagnosis Date   Admission for sterilization 06/12/2013   Asthma    Breast abscess    Depression    HX of - not on med n, doing well.   Eczema    Family history of anesthesia complication    Grandmother has difficulty waskin up.   Pneumonia    HX only -years ago has had a few times.   Pregnant state, incidental    S/P tubal ligation 06/13/2013   SVD (spontaneous vaginal delivery)    x 2   Past Surgical History: Past Surgical History:  Procedure Laterality Date   INCISE AND DRAIN ABCESS     left breast x 2  INCISION AND DRAINAGE ABSCESS Left 02/17/2012   Procedure:  DRAINAGEof recurrent breast abscess;  Surgeon: Harl Bowie, MD;  Location: [facility];  Service: General;  Laterality: Left;   LAPAROSCOPIC TUBAL LIGATION Bilateral 06/13/2013   Procedure: LAPAROSCOPIC TUBAL LIGATION;  Surgeon: Janyth Contes, MD;  Location: [facility];  Service: Gynecology;  Laterality: Bilateral;   TUBAL LIGATION     Medication List:  Current Outpatient Medications  Medication Sig Dispense Refill   albuterol (VENTOLIN HFA) 108 (90 Base) MCG/ACT inhaler Inhale 2 puffs into the lungs every 6 (six) hours as needed for wheezing or shortness of breath. 6.7 g 0   amLODipine (NORVASC) 5 MG tablet Take 1 tablet (5 mg total) by mouth daily. 30 tablet 0   budesonide-formoterol (SYMBICORT) 160-4.5 MCG/ACT inhaler Inhale 2 puffs into the lungs in the morning and at bedtime. 1 each 5   clobetasol ointment (TEMOVATE) 0.05 % Apply topically twice daily to BODY as needed for SEVERE red, sandpaper and thickened like rash.  Do not use on face, groin or armpits.  Use for up to two weeks at a time. 60 g 3   fluticasone (FLONASE) 50 MCG/ACT nasal spray Place 2 sprays into both nostrils daily. 16 g 2   hydrOXYzine (ATARAX) 50 MG tablet Take 1 tablet (50 mg total) by  mouth every 8 (eight) hours as needed for anxiety or itching. 90 tablet 0   levocetirizine (XYZAL) 5 MG tablet Take 1 tablet (5 mg total) by mouth every evening. 30 tablet 5   montelukast (SINGULAIR) 10 MG tablet Take 1 tablet (10 mg total) by mouth at bedtime. 30 tablet 5   ondansetron (ZOFRAN-ODT) 4 MG disintegrating tablet Take 1 tablet (4 mg total) by mouth every 8 (eight) hours as needed for nausea or vomiting. 20 tablet 0   PARoxetine (PAXIL) 40 MG tablet Take 1 tablet (40 mg total) by mouth daily. 30 tablet 0   sulfamethoxazole-trimethoprim (BACTRIM DS) 800-160 MG tablet Take 1 tablet by mouth 2 (two) times daily for 5 days. 10 tablet 0   tacrolimus (PROTOPIC) 0.1 % ointment Apply topically 2 (two) times daily. 100 g 3   triamcinolone ointment (KENALOG) 0.1 % Apply 1 Application topically 2 (two) times daily. 453 g 3   Vitamin D, Ergocalciferol, (DRISDOL) 1.25 MG (50000 UNIT) CAPS capsule Take 1 capsule (50,000 Units total) by mouth every 7 (seven) days. 16 capsule 0   No current facility-administered medications for this visit.   Allergies: Allergies  Allergen Reactions   Doxycycline Photosensitivity   Social History: Social History   Socioeconomic History   Marital status: Divorced    Spouse name: Not on file   Number of children: Not on file   Years of education: Not on file   Highest education level: Not on file  Occupational History   Not on file  Tobacco Use   Smoking status: Former    Packs/day: 1.00    Years: 5.00    Total pack years: 5.00    Types: Cigarettes    Quit date: 2021    Years since quitting: 3.0   Smokeless tobacco: Never  Vaping Use   Vaping Use: Every day  Substance and Sexual Activity   Alcohol use: Yes    Comment: occasional   Drug use: No   Sexual activity: Yes    Birth control/protection: Surgical  Other Topics Concern   Not on file  Social History Narrative   Lives with boyfriend [name] [name] and his son [name] [name]  2006   Not  currently working    Social Determinants of Health   Financial Resource Strain: Not on file  Food Insecurity: Food Insecurity Present (06/10/2020)   Hunger Vital Sign    Worried About Running Out of Food in the Last Year: Sometimes true    Ran Out of Food in the Last Year: Sometimes true  Transportation Needs: No Transportation Needs (06/10/2020)   PRAPARE - Administrator, Civil Service (Medical): No    Lack of Transportation (Non-Medical): No  Physical Activity: Not on file  Stress: Not on file  Social Connections: Not on file   Lives in a single-family home that was built in the 7s.  There are no roaches in the house and best to be on the floor.  There are no dust mite precautions on better pillows.  She is exposed to fumes, chemicals as dust as a housecleaner.  There is no HEPA filter in the home and [place]. Smoking: She is an active vapor and vapes inside her home and in the car Occupation: Works as a Medical laboratory scientific officer History: Immunologist in the house:  Armed forces training and education officer in the family room: no Carpet in the bedroom: no Heating: gas Cooling: window Pet: yes 1 dog and 3 cats with access to bedroom  Family History: Family History  Problem Relation Age of Onset   Healthy Mother    Anxiety disorder Mother    Healthy Father    Stroke Maternal Grandmother    Stroke Paternal Grandfather    Bipolar disorder Neg Hx    Depression Neg Hx      ROS: All others negative except as noted per HPI.   Objective: BP 126/82   Pulse 89   Temp 98.6 F (37 C)   Resp 16   Ht 5' (1.524 m)   Wt 215 lb 12.8 oz (97.9 kg)   SpO2 96%   BMI 42.15 kg/m  Body mass index is 42.15 kg/m.  General Appearance:  Alert, cooperative, no distress, appears stated age  Head:  Normocephalic, without obvious abnormality, atraumatic  Eyes:  Conjunctiva clear, EOM's intact  Nose: Nares normal,  erythematous nasal mucosa, small nasal  passages, no visible anterior polyps, and septum midline  Throat: Lips, tongue normal; teeth and gums normal, pulm and  Neck: Supple, symmetrical  Lungs:   clear to auscultation bilaterally, Respirations unlabored, no coughing  Heart:  regular rate and rhythm and no murmur, Appears well perfused  Extremities: No edema  Skin: Skin color, texture, turgor normal,  eczematous patches on face, arms, back, stomach, legs some with honey colored crusting and excoriation marks, BSA 40% involved   Neurologic: No gross deficits   The plan was reviewed with the patient/family, and all questions/concerned were addressed.  It was my pleasure to see Estee today and participate in her care. Please feel free to contact me with any questions or concerns.  Sincerely,  Ferol Luz, MD Allergy & Immunology  [facility] office: [phone] [facility] office: [phone]

## 2022-01-16 ENCOUNTER — Telehealth: Payer: Self-pay | Admitting: *Deleted

## 2022-01-16 NOTE — Telephone Encounter (Signed)
-----  Message from Roney Marion, MD sent at 01/13/2022  3:51 PM EST ----- I like to start this patient on Dupixent 300 mg every other week.  She has severe atopic dermatitis and is failed topical steroids of varying strengths and has required multiple oral steroids.  Current BSA is 40% Thanks!

## 2022-01-16 NOTE — Telephone Encounter (Signed)
Called patient and advise approval,copay card and submit for Dupixent to [facility]. Instructed patient on dosing, delivery and storage for same

## 2022-01-16 NOTE — Telephone Encounter (Signed)
Thanks

## 2022-01-18 LAB — ALLERGENS W/TOTAL IGE AREA 2
Alternaria Alternata IgE: 0.13 kU/L — AB
Aspergillus Fumigatus IgE: 0.15 kU/L — AB
Bermuda Grass IgE: 22.1 kU/L — AB
Cat Dander IgE: >100 kU/L — AB
Cedar, Mountain IgE: 1.15 kU/L — AB
Cladosporium Herbarum IgE: 0.16 kU/L — AB
Cockroach, German IgE: 2.11 kU/L — AB
Common Silver Birch IgE: 99.6 kU/L — AB
Cottonwood IgE: 0.84 kU/L — AB
D Farinae IgE: >100 kU/L — AB
D Pteronyssinus IgE: >100 kU/L — AB
Dog Dander IgE: >100 kU/L — AB
Elm, American IgE: 4.71 kU/L — AB
IgE (Immunoglobulin E), Serum: 12536 IU/mL — ABNORMAL HIGH (ref 6–495)
Johnson Grass IgE: 0.66 kU/L — AB
Maple/Box Elder IgE: 2.5 kU/L — AB
Mouse Urine IgE: 25.2 kU/L — AB
Oak, White IgE: >100 kU/L — AB
Pecan, Hickory IgE: 43.1 kU/L — AB
Penicillium Chrysogen IgE: 0.16 kU/L — AB
Pigweed, Rough IgE: 0.35 kU/L — AB
Ragweed, Short IgE: 0.64 kU/L — AB
Sheep Sorrel IgE Qn: 0.63 kU/L — AB
Timothy Grass IgE: 59.8 kU/L — AB
White Mulberry IgE: 0.32 kU/L — AB

## 2022-01-18 NOTE — Progress Notes (Signed)
Blood work came back very high positives to dust mite, cat, dog, grass, tree, weed, mold, mouse.  I would like to start allergy injections, but would recommend standard build up.  Can someone let patient know and she if she is interested?  Thanks!

## 2022-01-20 ENCOUNTER — Other Ambulatory Visit: Payer: Self-pay | Admitting: Critical Care Medicine

## 2022-01-20 ENCOUNTER — Other Ambulatory Visit (HOSPITAL_COMMUNITY): Payer: Self-pay

## 2022-01-20 DIAGNOSIS — J4540 Moderate persistent asthma, uncomplicated: Secondary | ICD-10-CM

## 2022-01-20 DIAGNOSIS — L309 Dermatitis, unspecified: Secondary | ICD-10-CM

## 2022-01-20 DIAGNOSIS — R030 Elevated blood-pressure reading, without diagnosis of hypertension: Secondary | ICD-10-CM

## 2022-01-20 DIAGNOSIS — F411 Generalized anxiety disorder: Secondary | ICD-10-CM

## 2022-01-20 MED ORDER — PAROXETINE HCL 40 MG PO TABS
40.0000 mg | ORAL_TABLET | Freq: Every day | ORAL | 0 refills | Status: DC
  Filled 2022-01-20 – 2022-01-30 (×2): qty 30, 30d supply, fill #0

## 2022-01-20 MED ORDER — AMLODIPINE BESYLATE 5 MG PO TABS
5.0000 mg | ORAL_TABLET | Freq: Every day | ORAL | 0 refills | Status: DC
  Filled 2022-01-20 – 2022-03-31 (×4): qty 30, 30d supply, fill #0

## 2022-01-20 MED ORDER — ALBUTEROL SULFATE HFA 108 (90 BASE) MCG/ACT IN AERS
2.0000 | INHALATION_SPRAY | Freq: Four times a day (QID) | RESPIRATORY_TRACT | 0 refills | Status: DC | PRN
  Filled 2022-01-20 – 2022-01-30 (×2): qty 6.7, 25d supply, fill #0

## 2022-01-20 MED ORDER — HYDROXYZINE HCL 50 MG PO TABS
50.0000 mg | ORAL_TABLET | Freq: Three times a day (TID) | ORAL | 0 refills | Status: DC | PRN
  Filled 2022-01-20 – 2022-01-30 (×2): qty 90, 30d supply, fill #0

## 2022-01-21 ENCOUNTER — Other Ambulatory Visit (HOSPITAL_COMMUNITY): Payer: Self-pay

## 2022-01-23 ENCOUNTER — Encounter (HOSPITAL_COMMUNITY): Payer: Self-pay

## 2022-01-23 ENCOUNTER — Other Ambulatory Visit (HOSPITAL_COMMUNITY): Payer: Self-pay

## 2022-01-24 ENCOUNTER — Ambulatory Visit
Admission: EM | Admit: 2022-01-24 | Discharge: 2022-01-24 | Disposition: A | Discharge status: Home / Self Care | Payer: Commercial Managed Care - HMO | Attending: Nurse Practitioner | Admitting: Nurse Practitioner

## 2022-01-24 DIAGNOSIS — Z1152 Encounter for screening for COVID-19: Secondary | ICD-10-CM | POA: Diagnosis not present

## 2022-01-24 DIAGNOSIS — J029 Acute pharyngitis, unspecified: Secondary | ICD-10-CM | POA: Diagnosis present

## 2022-01-24 DIAGNOSIS — B349 Viral infection, unspecified: Secondary | ICD-10-CM | POA: Insufficient documentation

## 2022-01-24 LAB — POCT RAPID STREP A (OFFICE): Rapid Strep A Screen: NEGATIVE

## 2022-01-24 NOTE — ED Triage Notes (Signed)
Pt states that she has a sore throat, fever, and nausea. X3 days

## 2022-01-24 NOTE — ED Provider Notes (Addendum)
[facility]    CSN: [id number] Arrival date & time: 01/24/22  1853      History   Chief Complaint Chief Complaint  Patient presents with   Sore Throat    X3 days Sore throat, fever, and nausea.     HPI [name] [name] is a 37 y.o. female who presents for evaluation of URI symptoms for 4 days. Patient reports associated symptoms of sore throat, nausea, headache, chills with subjective fevers. Denies V/D, cough, congestion, ear pain, shortness of breath, body aches. Patient does have a hx of asthma.  She has an inhaler and has not had to use since symptom onset.  No smoking. No known sick contacts and no recent travel. Pt is not vaccinated for COVID. Pt is not vaccinated for flu this season. Pt has taken Tylenol OTC for symptoms. Pt has no other concerns at this time.    Sore Throat Associated symptoms include headaches.    Past Medical History:  Diagnosis Date   Admission for sterilization 06/12/2013   Asthma    Breast abscess    Depression    HX of - not on med n, doing well.   Eczema    Family history of anesthesia complication    Grandmother has difficulty waskin up.   Pneumonia    HX only -years ago has had a few times.   Pregnant state, incidental    S/P tubal ligation 06/13/2013   SVD (spontaneous vaginal delivery)    x 2    Patient Active Problem List   Diagnosis Date Noted   Weight gain 04/04/2021   Women's annual routine gynecological examination 06/10/2020   Screening examination for STD (sexually transmitted disease) 06/10/2020   Former tobacco use 12/07/2014   Major depressive disorder, recurrent episode, moderate ([place]) 01/13/2014   GAD (generalized anxiety disorder) 01/13/2014   S/P tubal ligation 06/13/2013   Allergic conjunctivitis of both eyes and rhinitis 10/14/2012   Asthma 03/01/2006   Eczema 03/01/2006    Past Surgical History:  Procedure Laterality Date   INCISE AND DRAIN ABCESS     left breast x 2   INCISION AND DRAINAGE  ABSCESS Left 02/17/2012   Procedure:  DRAINAGEof recurrent breast abscess;  Surgeon: Harl Bowie, MD;  Location: [facility];  Service: General;  Laterality: Left;   LAPAROSCOPIC TUBAL LIGATION Bilateral 06/13/2013   Procedure: LAPAROSCOPIC TUBAL LIGATION;  Surgeon: Janyth Contes, MD;  Location: [facility];  Service: Gynecology;  Laterality: Bilateral;   TUBAL LIGATION      OB History     Gravida  2   Para  2   Term  1   Preterm  1   AB      Living  2      SAB      IAB      Ectopic      Multiple      Live Births  2            Home Medications    Prior to Admission medications   Medication Sig Start Date End Date Taking? Authorizing Provider  albuterol (VENTOLIN HFA) 108 (90 Base) MCG/ACT inhaler Inhale 2 puffs into the lungs every 6 (six) hours as needed for wheezing or shortness of breath. 01/20/22  Yes Elsie Stain, MD  amLODipine (NORVASC) 5 MG tablet Take 1 tablet (5 mg total) by mouth daily. Need appointment for refills 01/20/22  Yes Elsie Stain, MD  budesonide-formoterol [facility]) 160-4.5 MCG/ACT inhaler Inhale 2  puffs into the lungs in the morning and at bedtime. 01/13/22  Yes Roney Marion, MD  clobetasol ointment (TEMOVATE) 0.05 % Apply topically twice daily to BODY as needed for SEVERE red, sandpaper and thickened like rash.  Do not use on face, groin or armpits.  Use for up to two weeks at a time. 01/13/22  Yes Roney Marion, MD  fluticasone (FLONASE) 50 MCG/ACT nasal spray Place 2 sprays into both nostrils daily. 01/13/22  Yes Roney Marion, MD  hydrOXYzine (ATARAX) 50 MG tablet Take 1 tablet (50 mg total) by mouth every 8 (eight) hours as needed for anxiety or itching. Need appointment for refills 01/20/22  Yes Elsie Stain, MD  levocetirizine (XYZAL) 5 MG tablet Take 1 tablet (5 mg total) by mouth every evening. 01/13/22  Yes Roney Marion, MD  montelukast (SINGULAIR) 10 MG tablet Take 1 tablet (10 mg total) by mouth at  bedtime. 01/13/22  Yes Roney Marion, MD  ondansetron (ZOFRAN-ODT) 4 MG disintegrating tablet Take 1 tablet (4 mg total) by mouth every 8 (eight) hours as needed for nausea or vomiting. 12/07/21  Yes [place], Hildred Alamin E, FNP  PARoxetine (PAXIL) 40 MG tablet Take 1 tablet (40 mg total) by mouth daily. Make appointment for refills 01/20/22  Yes Elsie Stain, MD  tacrolimus (PROTOPIC) 0.1 % ointment Apply topically 2 (two) times daily. 01/13/22  Yes Roney Marion, MD  triamcinolone ointment (KENALOG) 0.1 % Apply 1 Application topically 2 (two) times daily. 01/13/22  Yes Roney Marion, MD  Vitamin D, Ergocalciferol, (DRISDOL) 1.25 MG (50000 UNIT) CAPS capsule Take 1 capsule (50,000 Units total) by mouth every 7 (seven) days. 03/02/21  Yes Argentina Donovan, PA-C  famotidine (PEPCID) 20 MG tablet Take 1 tablet (20 mg total) by mouth 2 (two) times daily. 01/03/18 06/26/19  Rodell Perna A, PA-C  gabapentin (NEURONTIN) 100 MG capsule Take 1 capsule (100 mg total) by mouth 3 (three) times daily. 11/21/18 06/26/19  Domenic Moras, PA-C    Family History Family History  Problem Relation Age of Onset   Healthy Mother    Anxiety disorder Mother    Healthy Father    Stroke Maternal Grandmother    Stroke Paternal Grandfather    Bipolar disorder Neg Hx    Depression Neg Hx     Social History Social History   Tobacco Use   Smoking status: Former    Packs/day: 1.00    Years: 5.00    Total pack years: 5.00    Types: Cigarettes    Quit date: 2021    Years since quitting: 3.0   Smokeless tobacco: Never  Vaping Use   Vaping Use: Every day  Substance Use Topics   Alcohol use: Yes    Comment: occasional   Drug use: No     Allergies   Doxycycline   Review of Systems Review of Systems  Constitutional:  Positive for chills.       Subjective fevers  HENT:  Positive for sore throat.   Neurological:  Positive for headaches.     Physical Exam Triage Vital Signs ED Triage Vitals  Enc Vitals  Group     BP 01/24/22 1901 134/87     Pulse Rate 01/24/22 1901 92     Resp 01/24/22 1901 18     Temp 01/24/22 1901 99.7 F (37.6 C)     Temp Source 01/24/22 1901 Oral     SpO2 01/24/22 1901 97 %     Weight 01/24/22 1900 220 lb (  99.8 kg)     Height 01/24/22 1900 5' (1.524 m)     Head Circumference --      Peak Flow --      Pain Score 01/24/22 1900 4     Pain Loc --      Pain Edu? --      Excl. in GC? --    No data found.  Updated Vital Signs BP 134/87 (BP Location: Right Arm)   Pulse 92   Temp 99.7 F (37.6 C) (Oral)   Resp 18   Ht 5' (1.524 m)   Wt 220 lb (99.8 kg)   SpO2 97%   BMI 42.97 kg/m   Visual Acuity Right Eye Distance:   Left Eye Distance:   Bilateral Distance:    Right Eye Near:   Left Eye Near:    Bilateral Near:     Physical Exam Vitals and nursing note reviewed.  Constitutional:      General: She is not in acute distress.    Appearance: She is well-developed. She is not ill-appearing.  HENT:     Head: Normocephalic and atraumatic.     Right Ear: Tympanic membrane and ear canal normal.     Left Ear: Tympanic membrane and ear canal normal.     Nose: Congestion present.     Mouth/Throat:     Mouth: Mucous membranes are moist.     Pharynx: Oropharynx is clear. Uvula midline. Posterior oropharyngeal erythema present.     Tonsils: No tonsillar exudate or tonsillar abscesses.  Eyes:     Conjunctiva/sclera: Conjunctivae normal.     Pupils: Pupils are equal, round, and reactive to light.  Cardiovascular:     Rate and Rhythm: Normal rate and regular rhythm.     Heart sounds: Normal heart sounds.  Pulmonary:     Effort: Pulmonary effort is normal.     Breath sounds: Normal breath sounds.  Musculoskeletal:     Cervical back: Normal range of motion and neck supple.  Lymphadenopathy:     Cervical: No cervical adenopathy.  Skin:    General: Skin is warm and dry.  Neurological:     General: No focal deficit present.     Mental Status: She is alert  and oriented to person, place, and time.  Psychiatric:        Mood and Affect: Mood normal.        Behavior: Behavior normal.      UC Treatments / Results  Labs (all labs ordered are listed, but only abnormal results are displayed) Labs Reviewed  POCT RAPID STREP A (OFFICE) - Normal  CULTURE, GROUP A STREP ([facility])  SARS CORONAVIRUS 2 (TAT 6-24 HRS)    EKG   Radiology No results found.  Procedures Procedures (including critical care time)  Medications Ordered in UC Medications - No data to display  Initial Impression / Assessment and Plan / UC Course  I have reviewed the triage vital signs and the nursing notes.  Pertinent labs & imaging results that were available during my care of the patient were reviewed by me and considered in my medical decision making (see chart for details).     Reviewed exam and symptoms with patient.  No red flags on exam Rapid strep negative in clinic will culture COVID PCR and will contact if positive Discussed viral illness and symptomatic treatment Salt water gargles and warm liquids Over-the-counter analgesics as needed Rest and fluids Follow-up with PCP 2 to 3 days for recheck ER  precautions reviewed and patient verbalized understanding Final Clinical Impressions(s) / UC Diagnoses   Final diagnoses:  Sore throat  Viral syndrome     Discharge Instructions      The clinic will contact you if the results of your throat culture are positive Rest and fluids with and salt water gargles and warm liquids Over-the-counter Tylenol or ibuprofen as needed Follow-up with your PCP 2 to 3 days for recheck Please go to the emergency room if you have any worsening symptoms     ED Prescriptions   None    PDMP not reviewed this encounter.   Radford Pax, NP 01/24/22 1921    Radford Pax, NP 01/24/22 [phone]

## 2022-01-24 NOTE — Discharge Instructions (Signed)
The clinic will contact you if the results of your throat culture are positive Rest and fluids with and salt water gargles and warm liquids Over-the-counter Tylenol or ibuprofen as needed Follow-up with your PCP 2 to 3 days for recheck Please go to the emergency room if you have any worsening symptoms

## 2022-01-25 ENCOUNTER — Other Ambulatory Visit: Payer: Self-pay

## 2022-01-25 LAB — SARS CORONAVIRUS 2 (TAT 6-24 HRS): SARS Coronavirus 2: NEGATIVE

## 2022-01-27 LAB — CULTURE, GROUP A STREP (THRC)

## 2022-01-30 ENCOUNTER — Other Ambulatory Visit: Payer: Self-pay

## 2022-02-04 ENCOUNTER — Other Ambulatory Visit: Payer: Self-pay | Admitting: Internal Medicine

## 2022-02-10 ENCOUNTER — Other Ambulatory Visit: Payer: Self-pay

## 2022-02-10 ENCOUNTER — Ambulatory Visit (INDEPENDENT_AMBULATORY_CARE_PROVIDER_SITE_OTHER): Payer: Commercial Managed Care - HMO | Admitting: Internal Medicine

## 2022-02-10 ENCOUNTER — Encounter: Payer: Self-pay | Admitting: Internal Medicine

## 2022-02-10 VITALS — BP 130/86 | HR 96 | Temp 98.4°F | Resp 18

## 2022-02-10 DIAGNOSIS — L2084 Intrinsic (allergic) eczema: Secondary | ICD-10-CM

## 2022-02-10 DIAGNOSIS — J4540 Moderate persistent asthma, uncomplicated: Secondary | ICD-10-CM

## 2022-02-10 DIAGNOSIS — H1045 Other chronic allergic conjunctivitis: Secondary | ICD-10-CM

## 2022-02-10 DIAGNOSIS — J3089 Other allergic rhinitis: Secondary | ICD-10-CM

## 2022-02-10 DIAGNOSIS — Z720 Tobacco use: Secondary | ICD-10-CM

## 2022-02-10 NOTE — Patient Instructions (Addendum)
Atopic Dermatitis: improved  Daily Care For Maintenance (daily and continue even once eczema controlled) - Recommend hypoallergenic hydrating ointment at least twice daily.  This must be done daily for control of flares. (Great options include Vaseline, CeraVe, Aquaphor, Aveeno, Cetaphil, VaniCream, etc) - Recommend avoiding detergents, soaps or lotions with fragrances/dyes, and instead using products which are hypoallergenic, use second rinse cycle when washing clothes -Wear lose breathable clothing, avoid wool -Avoid extremes of humidity - Limit showers/baths to 5 minutes and use luke warm water instead of hot, pat dry following baths, and apply moisturizer - can use steroid creams as detailed below up to twice weekly for prevention of flares. -Conitnue Dupixent 346m every 2 weeks, loading dose and consent given and obtained today    For Flares:(add this to maintenance therapy if needed for flares) - Clobetasol 0.5% to body for severe flares-apply topically twice daily to red, raised, thickened areas of skin, followed by moisturizer  - Triamcinolone 0.1% to body for moderate flares-apply topically twice daily to red, raised areas of skin, followed by moisturizer - tacrolimus ointment twice a day (can be used for face and body) - Hydroxyzine 258mas needed for itching  Chronic  Rhinitis allergic  improved  - continue allergy avoidance to pollens, cat, dog, dust mite, roach   - Continue Nasal Steroid Spray: Options include Flonase (fluticasone), Nasocort (triamcinolone), Nasonex (mometasome) 1- 2 sprays in each nostril daily (can buy over-the-counter if not covered by insurance)  Best results if used daily. - Continue Singulair (Montelukast) 1088mightly. - Continue over the counter antihistamine daily or daily as needed.   -Your options include Zyrtec (Cetirizine) 41m7mlaritin (Loratadine) 41mg51mlegra (Fexofenadine) 180mg,40mXyzal (Levocetirinze) 5mg - 12mormation on allergy injections  given today   Allergic Conjunctivitis:  - Continue Allergy Eye drops: great options include Pataday (Olopatadine) or Zaditor (ketotifen) for eye symptoms daily as needed-both sold over the counter if not covered by insurance.   -Avoid eye drops that say red eye relief as they may contain medications that dry out your eyes.  Moderate Persistent  Asthma: improved  - Breathing test today showed: looked great!  PLAN:  - Spacer sample and demonstration provided. - Daily controller medication(s): Singulair 41mg da6mand Symbicort 160/4.5mcg two32mffs twice daily with spacer - Prior to physical activity: albuterol 2 puffs 10-15 minutes before physical activity. - Rescue medications: albuterol 4 puffs every 4-6 hours as needed - Changes during respiratory infections or worsening symptoms: Increase Symbicort  to 4 puffs twice daily for TWO WEEKS. - Asthma control goals:  * Full participation in all desired activities (may need albuterol before activity) * Albuterol use two time or less a week on average (not counting use with activity) * Cough interfering with sleep two time or less a month * Oral steroids no more than once a year * No hospitalizations   GERD  -Continue dietary and lifestyle modifications  Follow up: 4 months   Thank you so much for letting me partake in your care today.  Don't hesitate to reach out if you have any additional concerns!  Tyrik Stetzer LoRoney Marionergy and Asthma [facility]

## 2022-02-10 NOTE — Progress Notes (Unsigned)
Follow Up Note  RE: [name] [name] MRN: [id number] DOB: August 24, 1985 Date of Office Visit: 02/10/2022  Referring provider: Elsie Stain, MD Primary care provider: Elsie Stain, MD  Chief Complaint: Other (Skin is better )  History of Present Illness: I had the pleasure of seeing [name] [name] for a follow up visit at the [facility] on 02/10/2022. She is a 37 y.o. female, who is being followed for persistent asthma, allergic rhinitis, atopic dermatitis. Her previous allergy office visit was on 01/13/2022 with Dr. Edison Pace. Today is a regular follow up visit.  History obtained from patient, chart review.  At last visit patient had severe uncontrolled atopic disease mostly manifesting as atopic dermatitis.  She is treated for secondary skin infection Bactrim and started on Dupixent.  Skin testing has been deferred due to severity of eczema.  Specific IgE obtained and she was pan sensitized  Pertinent History/Diagnostics:  - Asthma: Mild persistent  - restrictive pattern spirometry (01/13/2022): ratio 77%, 1.92L, 71% FEV1 (pre), 2.12L, 78% FEV1 (post)  -  Total IgE (01/13/22) 12,536  - Allergic Rhinitis:   - sIgE  environmental panel (01/13/22): Trees, grass, weeds, molds, cat, dog, dust mite, mouse, roach - Atopic Dermatitis   - severe, dupixent started 01/13/22  - secondary skin infection treated with bactrim (01/13/22)  - Will need patch testing in future     Assessment and Plan: Jamora is a 37 y.o. female with: No diagnosis found.  *** Plan: Patient Instructions  Atopic Dermatitis: improved  Daily Care For Maintenance (daily and continue even once eczema controlled) - Recommend hypoallergenic hydrating ointment at least twice daily.  This must be done daily for control of flares. (Great options include Vaseline, CeraVe, Aquaphor, Aveeno, Cetaphil, VaniCream, etc) - Recommend avoiding detergents, soaps or lotions with fragrances/dyes, and  instead using products which are hypoallergenic, use second rinse cycle when washing clothes -Wear lose breathable clothing, avoid wool -Avoid extremes of humidity - Limit showers/baths to 5 minutes and use luke warm water instead of hot, pat dry following baths, and apply moisturizer - can use steroid creams as detailed below up to twice weekly for prevention of flares. -Conitnue Dupixent 368m every 2 weeks, loading dose and consent given and obtained today    For Flares:(add this to maintenance therapy if needed for flares) - Clobetasol 0.5% to body for severe flares-apply topically twice daily to red, raised, thickened areas of skin, followed by moisturizer  - Triamcinolone 0.1% to body for moderate flares-apply topically twice daily to red, raised areas of skin, followed by moisturizer - tacrolimus ointment twice a day (can be used for face and body) - Hydroxyzine 277mas needed for itching  Chronic  Rhinitis allergic  improved  - continue allergy avoidance to pollens, cat, dog, dust mite, roach   - Continue Nasal Steroid Spray: Options include Flonase (fluticasone), Nasocort (triamcinolone), Nasonex (mometasome) 1- 2 sprays in each nostril daily (can buy over-the-counter if not covered by insurance)  Best results if used daily. - Continue Singulair (Montelukast) 1043mightly. - Continue over the counter antihistamine daily or daily as needed.   -Your options include Zyrtec (Cetirizine) 21m53mlaritin (Loratadine) 21mg51mlegra (Fexofenadine) 180mg,32mXyzal (Levocetirinze) 5mg - 29mormation on allergy injections given today   Allergic Conjunctivitis:  - Continue Allergy Eye drops: great options include Pataday (Olopatadine) or Zaditor (ketotifen) for eye symptoms daily as needed-both sold over the counter if not covered by insurance.   -Avoid eye drops that  say red eye relief as they may contain medications that dry out your eyes.  Moderate Persistent  Asthma: improved  - Breathing  test today showed: looked   PLAN:  - Spacer sample and demonstration provided. - Daily controller medication(s): Singulair 33m daily and Symbicort 160/4.583m two puffs twice daily with spacer - Prior to physical activity: albuterol 2 puffs 10-15 minutes before physical activity. - Rescue medications: albuterol 4 puffs every 4-6 hours as needed - Changes during respiratory infections or worsening symptoms: Increase Symbicort  to 4 puffs twice daily for TWO WEEKS. - Asthma control goals:  * Full participation in all desired activities (may need albuterol before activity) * Albuterol use two time or less a week on average (not counting use with activity) * Cough interfering with sleep two time or less a month * Oral steroids no more than once a year * No hospitalizations   GERD  -Continue dietary and lifestyle modifications  Follow up: 4 months   Thank you so much for letting me partake in your care today.  Don't hesitate to reach out if you have any additional concerns!  EvRoney MarionMD  [facility] [place] [place]    No orders of the defined types were placed in this encounter.   Lab Orders  No laboratory test(s) ordered today   Diagnostics: Spirometry:  Tracings reviewed. Her effort: Good reproducible efforts. FVC: 2.67 L FEV1: 2.20 L, 81% predicted FEV1/FVC ratio: 82% Interpretation: Spirometry consistent with normal pattern.  Please see scanned spirometry results for details.   Results interpreted by myself during this encounter and discussed with patient/family.   Medication List:  Current Outpatient Medications  Medication Sig Dispense Refill   albuterol (VENTOLIN HFA) 108 (90 Base) MCG/ACT inhaler Inhale 2 puffs into the lungs every 6 (six) hours as needed for wheezing or shortness of breath. 6.7 g 0   amLODipine (NORVASC) 5 MG tablet Take 1 tablet (5 mg total) by mouth daily. Need appointment for refills 30 tablet 0   budesonide-formoterol  (SYMBICORT) 160-4.5 MCG/ACT inhaler Inhale 2 puffs into the lungs in the morning and at bedtime. 1 each 5   clobetasol ointment (TEMOVATE) 0.05 % Apply topically twice daily to BODY as needed for SEVERE red, sandpaper and thickened like rash.  Do not use on face, groin or armpits.  Use for up to two weeks at a time. 60 g 3   DUPIXENT 300 MG/2ML prefilled syringe Inject into the skin.     fluticasone (FLONASE) 50 MCG/ACT nasal spray SPRAY 2 SPRAYS INTO EACH NOSTRIL EVERY DAY 48 mL 1   hydrOXYzine (ATARAX) 50 MG tablet Take 1 tablet (50 mg total) by mouth every 8 (eight) hours as needed for anxiety or itching. Need appointment for refills 90 tablet 0   levocetirizine (XYZAL) 5 MG tablet Take 1 tablet (5 mg total) by mouth every evening. 30 tablet 5   montelukast (SINGULAIR) 10 MG tablet Take 1 tablet (10 mg total) by mouth at bedtime. 30 tablet 5   ondansetron (ZOFRAN-ODT) 4 MG disintegrating tablet Take 1 tablet (4 mg total) by mouth every 8 (eight) hours as needed for nausea or vomiting. 20 tablet 0   PARoxetine (PAXIL) 40 MG tablet Take 1 tablet (40 mg total) by mouth daily. Make appointment for refills 30 tablet 0   tacrolimus (PROTOPIC) 0.1 % ointment Apply topically 2 (two) times daily. 100 g 3   triamcinolone ointment (KENALOG) 0.1 % Apply 1 Application topically 2 (two) times  daily. 453 g 3   Vitamin D, Ergocalciferol, (DRISDOL) 1.25 MG (50000 UNIT) CAPS capsule Take 1 capsule (50,000 Units total) by mouth every 7 (seven) days. 16 capsule 0   No current facility-administered medications for this visit.   Allergies: Allergies  Allergen Reactions   Doxycycline Photosensitivity   I reviewed her past medical history, social history, family history, and environmental history and no significant changes have been reported from her previous visit.  ROS: All others negative except as noted per HPI.   Objective: BP 130/86   Pulse 96   Temp 98.4 F (36.9 C)   Resp 18   SpO2 98%  There is no  height or weight on file to calculate BMI. General Appearance:  Alert, cooperative, no distress, appears stated age  Head:  Normocephalic, without obvious abnormality, atraumatic  Eyes:  Conjunctiva clear, EOM's intact  Nose: Nares normal, {Blank multiple:19196:a:"***","hypertrophic turbinates","normal mucosa","no visible anterior polyps","septum midline"}  Throat: Lips, tongue normal; teeth and gums normal, {Blank multiple:19196:a:"***","normal posterior oropharynx","tonsils 2+","tonsils 3+","no tonsillar exudate","+ cobblestoning"}  Neck: Supple, symmetrical  Lungs:   {Blank multiple:19196:a:"***","clear to auscultation bilaterally","end-expiratory wheezing","wheezing throughout"}, Respirations unlabored, {Blank multiple:19196:a:"***","no coughing","intermittent dry coughing"}  Heart:  {Blank multiple:19196:a:"***","regular rate and rhythm","no murmur"}, Appears well perfused  Extremities: No edema  Skin: Skin color, texture, turgor normal, {Blank multiple:19196:a:""***","no rashes or lesions on visualized portions of skin"}  Neurologic: No gross deficits   Previous notes and tests were reviewed. The plan was reviewed with the patient/family, and all questions/concerned were addressed.  It was my pleasure to see [name] [name] today and participate in her care. Please feel free to contact me with any questions or concerns.  Sincerely,  Roney Marion, MD  Allergy & Immunology  [facility] [place] [facility] Office: [phone]

## 2022-02-14 DIAGNOSIS — J3089 Other allergic rhinitis: Secondary | ICD-10-CM | POA: Insufficient documentation

## 2022-02-14 DIAGNOSIS — Z720 Tobacco use: Secondary | ICD-10-CM | POA: Insufficient documentation

## 2022-02-15 ENCOUNTER — Ambulatory Visit: Payer: Commercial Managed Care - HMO | Admitting: Critical Care Medicine

## 2022-02-15 NOTE — Progress Notes (Deleted)
Acute Office Visit  Subjective:     Patient ID: [name] [name], female    DOB: 1985/06/17, 37 y.o.   MRN: [id number]   HPI 08/2021 Patient is in today for patient is seen by way of a video visit for depression.  Patient states she has had worsening depression over the past month.  She was having headaches and was seen recently started on amlodipine 5 mg daily for elevated blood pressure.  Blood pressure now is in the 120 over 70s.  Patient states she is on the Paxil for anxiety this works well.  However her depression is gotten worse.  She does not drink alcohol smoke or use substances.  She needs follow-up labs.  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  She is sleeping well there is no disruptions there.  The patient is not suicidal  02/15/22    Follow Up Note   RE: [name] [name]       MRN: [id number]        DOB: 15-Sep-1985 Date of Office Visit: 02/10/2022   Referring provider: Elsie Stain, MD Primary care provider: Elsie Stain, MD   Chief Complaint: Other (Skin is better )   History of Present Illness: I had the pleasure of seeing [name] [name] for a follow up visit at the [facility] on 02/14/2022. She is a 37 y.o. female, who is being followed for persistent asthma, allergic rhinitis, atopic dermatitis. Her previous allergy office visit was on 01/13/2022 with Dr. Edison Pace. Today is a regular follow up visit.   History obtained from patient, chart review.  At last visit patient had severe uncontrolled atopic disease mostly manifesting as atopic dermatitis.  She is treated for secondary skin infection Bactrim and started on Dupixent.  Skin testing has been deferred due to severity of eczema.  Specific IgE obtained and she was pan sensitized.   She has received 2 dupisxent injections and has noticed a dramatic decrease in overall itch, lack of new lesions and slow healing of chronic lesions.  Denies any side effects of injection. She is using  tacrolimus daily and has stepped down from clobetasol to triamcinolone for chronic lesions (still applying daily until healed).     Rhinitis is well controlled with flonase, singular and cetrizine daily.  She has not had any ocular symptoms and has not required allergy eye drops.    Denies any cough, wheeze, dyspnea. No albuterol use.  Compliant with all doses of symbicort 117mg 2 puffs daily. No ABX or OCS since last visit.     GERD- contolling with diet.  Rare breakthrough symptoms        Pertinent History/Diagnostics:  - Asthma: Mild persistent             - restrictive pattern spirometry (01/13/2022): ratio 77%, 1.92L, 71% FEV1 (pre), 2.12L, 78% FEV1 (post)             -  Total IgE (01/13/22) 12,536    - Allergic Rhinitis:              - sIgE  environmental panel (01/13/22): Trees, grass, weeds, molds, cat, dog, dust mite, mouse, roach - Atopic Dermatitis              - severe, dupixent started 01/13/22             - secondary skin infection treated with bactrim (01/13/22)             - Will need patch testing  in future        Assessment and Plan: [name] [name] is a 36 y.o. female with: Intrinsic atopic dermatitis   Moderate persistent asthma without complication - Plan: Spirometry with Graph   Other allergic rhinitis   Other chronic allergic conjunctivitis of both eyes   Vapes nicotine containing substance     Plan: Patient Instructions  Atopic Dermatitis: improved  Daily Care For Maintenance (daily and continue even once eczema controlled) - Recommend hypoallergenic hydrating ointment at least twice daily.  This must be done daily for control of flares. (Great options include Vaseline, CeraVe, Aquaphor, Aveeno, Cetaphil, VaniCream, etc) - Recommend avoiding detergents, soaps or lotions with fragrances/dyes, and instead using products which are hypoallergenic, use second rinse cycle when washing clothes -Wear lose breathable clothing, avoid wool -Avoid extremes of humidity -  Limit showers/baths to 5 minutes and use luke warm water instead of hot, pat dry following baths, and apply moisturizer - can use steroid creams as detailed below up to twice weekly for prevention of flares. -Conitnue Dupixent 391m every 2 weeks, loading dose and consent given and obtained today      For Flares:(add this to maintenance therapy if needed for flares) - Clobetasol 0.5% to body for severe flares-apply topically twice daily to red, raised, thickened areas of skin, followed by moisturizer  - Triamcinolone 0.1% to body for moderate flares-apply topically twice daily to red, raised areas of skin, followed by moisturizer - tacrolimus ointment twice a day (can be used for face and body) - Hydroxyzine 272mas needed for itching   Chronic  Rhinitis allergic  improved  - continue allergy avoidance to pollens, cat, dog, dust mite, roach   - Continue Nasal Steroid Spray: Options include Flonase (fluticasone), Nasocort (triamcinolone), Nasonex (mometasome) 1- 2 sprays in each nostril daily (can buy over-the-counter if not covered by insurance)  Best results if used daily. - Continue Singulair (Montelukast) 1083mightly. - Continue over the counter antihistamine daily or daily as needed.   -Your options include Zyrtec (Cetirizine) 63m63mlaritin (Loratadine) 63mg52mlegra (Fexofenadine) 180mg,12mXyzal (Levocetirinze) 5mg - 23mormation on allergy injections given today    Allergic Conjunctivitis:  - Continue Allergy Eye drops: great options include Pataday (Olopatadine) or Zaditor (ketotifen) for eye symptoms daily as needed-both sold over the counter if not covered by insurance.   -Avoid eye drops that say red eye relief as they may contain medications that dry out your eyes.   Moderate Persistent  Asthma: improved  - Breathing test today showed: looked great!   PLAN:  - Spacer sample and demonstration provided. - Daily controller medication(s): Singulair 63mg da38mand Symbicort  160/4.5mcg two44mffs twice daily with spacer - Prior to physical activity: albuterol 2 puffs 10-15 minutes before physical activity. - Rescue medications: albuterol 4 puffs every 4-6 hours as needed - Changes during respiratory infections or worsening symptoms: Increase Symbicort  to 4 puffs twice daily for TWO WEEKS. - Asthma control goals:  * Full participation in all desired activities (may need albuterol before activity) * Albuterol use two time or less a week on average (not counting use with activity) * Cough interfering with sleep two time or less a month * Oral steroids no more than once a year * No hospitalizations    GERD  -Continue dietary and lifestyle modifications   Follow up: 4 months    Thank you so much for letting me partake in your care today.  Don't hesitate to reach out if you  have any additional concerns!   [name] Marion, MD  [facility] [place] [place]        Review of Systems  Constitutional:  Negative for chills, diaphoresis, fever, malaise/fatigue and weight loss.  HENT:  Negative for congestion, hearing loss, nosebleeds, sore throat and tinnitus.   Eyes:  Negative for blurred vision, photophobia and redness.  Respiratory:  Negative for cough, hemoptysis, sputum production, shortness of breath, wheezing and stridor.   Cardiovascular:  Negative for chest pain, palpitations, orthopnea, claudication, leg swelling and PND.  Gastrointestinal:  Negative for abdominal pain, blood in stool, constipation, diarrhea, heartburn, nausea and vomiting.  Genitourinary:  Negative for dysuria, flank pain, frequency, hematuria and urgency.  Musculoskeletal:  Negative for back pain, falls, joint pain, myalgias and neck pain.  Skin:  Negative for itching and rash.  Neurological:  Negative for dizziness, tingling, tremors, sensory change, speech change, focal weakness, seizures, loss of consciousness, weakness and headaches.  Endo/Heme/Allergies:  Negative for  environmental allergies and polydipsia. Does not bruise/bleed easily.  Psychiatric/Behavioral:  Positive for depression. Negative for hallucinations, memory loss, substance abuse and suicidal ideas. The patient is not nervous/anxious and does not have insomnia.        Lost all interest        Objective:    There were no vitals taken for this visit. BP Readings from Last 3 Encounters:  02/10/22 130/86  01/24/22 134/87  01/13/22 126/82   Wt Readings from Last 3 Encounters:  01/24/22 220 lb (99.8 kg)  01/13/22 215 lb 12.8 oz (97.9 kg)  07/28/21 211 lb 3.2 oz (95.8 kg)      Physical Exam No exam this is a video visit the patient is in no distress on the video No results found for any visits on 02/15/22.      Assessment & Plan:   Problem List Items Addressed This Visit   None  No orders of the defined types were placed in this encounter.      Follow Up Instructions:  Patient knows and exam will occur in the next month she will also be contacted by licensed clinical social work and furl to [facility] made I discussed the assessment and treatment plan with the patient. The patient was provided an opportunity to ask questions and all were answered. The patient agreed with the plan and demonstrated an understanding of the instructions.   The patient was advised to call back or seek an in-person evaluation if the symptoms worsen or if the condition fails to improve as anticipated.  I provided 25 minutes of non-face-to-face time during this encounter  including  median intraservice time , review of notes, labs, imaging, medications  and explaining diagnosis and management to the patient .    Asencion Noble, MD

## 2022-03-27 ENCOUNTER — Other Ambulatory Visit (HOSPITAL_COMMUNITY): Payer: Self-pay

## 2022-03-27 ENCOUNTER — Other Ambulatory Visit: Payer: Self-pay | Admitting: Critical Care Medicine

## 2022-03-27 ENCOUNTER — Other Ambulatory Visit: Payer: Self-pay | Admitting: Physician Assistant

## 2022-03-27 ENCOUNTER — Other Ambulatory Visit: Payer: Self-pay

## 2022-03-27 DIAGNOSIS — L309 Dermatitis, unspecified: Secondary | ICD-10-CM

## 2022-03-27 DIAGNOSIS — J4540 Moderate persistent asthma, uncomplicated: Secondary | ICD-10-CM

## 2022-03-27 DIAGNOSIS — F411 Generalized anxiety disorder: Secondary | ICD-10-CM

## 2022-03-27 DIAGNOSIS — E559 Vitamin D deficiency, unspecified: Secondary | ICD-10-CM

## 2022-03-28 ENCOUNTER — Ambulatory Visit
Admission: EM | Admit: 2022-03-28 | Discharge: 2022-03-28 | Payer: Commercial Managed Care - HMO | Attending: Family Medicine | Admitting: Family Medicine

## 2022-03-28 DIAGNOSIS — R42 Dizziness and giddiness: Secondary | ICD-10-CM | POA: Diagnosis not present

## 2022-03-28 DIAGNOSIS — R079 Chest pain, unspecified: Secondary | ICD-10-CM

## 2022-03-28 LAB — POCT FASTING CBG KUC MANUAL ENTRY: POCT Glucose (KUC): 112 mg/dL — AB (ref 70–99)

## 2022-03-28 NOTE — ED Notes (Signed)
Patient is being discharged from the Urgent Care and sent to the Emergency Department via personal vehicle with spouse . Per Provider [facility], patient is in need of higher level of care due to chest pain. Patient is aware and verbalizes understanding of plan of care.   Vitals:   03/28/22 1545  BP: 139/85  Pulse: 92  Resp: 17  Temp: 98.1 F (36.7 C)  SpO2: 98%

## 2022-03-28 NOTE — ED Triage Notes (Signed)
Pt presents with episode of feeling flushed, dizziness, and generalized chest pain today.

## 2022-03-28 NOTE — ED Provider Notes (Signed)
[facility]    CSN: [id number] Arrival date & time: 03/28/22  1535      History   Chief Complaint Chief Complaint  Patient presents with   Chest Pain   Dizziness   Flushed    HPI [name] [name] is a 37 y.o. female.   Patient presents with chest pain that started about 1 to 2 hours prior to arrival to urgent care.  Patient reports that she was having a bowel movement, and the symptoms started subsequently after she stood up.  She denies history of similar symptoms.  Reports chest pain is described as a sharp and achy pain in the left side of her chest and is constant.  Dizziness is also constant but she denies blurred vision, nausea, vomiting.  Patient does report frontal headache as well that started at the same time with symptoms.  Denies any recent falls or head trauma.  Denies history of cardiac problems.  She does report history of hypertension.  She has not had any recent medication changes.  She does report a little bit of shortness of breath.  Reports history of asthma but states that this does not feel similar to asthma exacerbation.   Chest Pain Dizziness   Past Medical History:  Diagnosis Date   Admission for sterilization 06/12/2013   Asthma    Breast abscess    Depression    HX of - not on med n, doing well.   Eczema    Family history of anesthesia complication    Grandmother has difficulty waskin up.   Pneumonia    HX only -years ago has had a few times.   Pregnant state, incidental    S/P tubal ligation 06/13/2013   SVD (spontaneous vaginal delivery)    x 2    Patient Active Problem List   Diagnosis Date Noted   Other allergic rhinitis 02/14/2022   Vapes nicotine containing substance 02/14/2022   Weight gain 04/04/2021   Women's annual routine gynecological examination 06/10/2020   Screening examination for STD (sexually transmitted disease) 06/10/2020   Former tobacco use 12/07/2014   Major depressive disorder, recurrent episode,  moderate ([place]) 01/13/2014   GAD (generalized anxiety disorder) 01/13/2014   S/P tubal ligation 06/13/2013   Conjunctivitis 10/14/2012   Asthma 03/01/2006   Intrinsic atopic dermatitis 03/01/2006    Past Surgical History:  Procedure Laterality Date   INCISE AND DRAIN ABCESS     left breast x 2   INCISION AND DRAINAGE ABSCESS Left 02/17/2012   Procedure:  DRAINAGEof recurrent breast abscess;  Surgeon: Harl Bowie, MD;  Location: [facility];  Service: General;  Laterality: Left;   LAPAROSCOPIC TUBAL LIGATION Bilateral 06/13/2013   Procedure: LAPAROSCOPIC TUBAL LIGATION;  Surgeon: Janyth Contes, MD;  Location: [facility];  Service: Gynecology;  Laterality: Bilateral;   TUBAL LIGATION      OB History     Gravida  2   Para  2   Term  1   Preterm  1   AB      Living  2      SAB      IAB      Ectopic      Multiple      Live Births  2            Home Medications    Prior to Admission medications   Medication Sig Start Date End Date Taking? Authorizing Provider  albuterol (VENTOLIN HFA) 108 (90 Base) MCG/ACT inhaler Inhale 2  puffs into the lungs every 6 (six) hours as needed for wheezing or shortness of breath. 01/20/22   Elsie Stain, MD  amLODipine (NORVASC) 5 MG tablet Take 1 tablet (5 mg total) by mouth daily. Need appointment for refills 01/20/22   Elsie Stain, MD  budesonide-formoterol [facility]) 160-4.5 MCG/ACT inhaler Inhale 2 puffs into the lungs in the morning and at bedtime. 01/13/22   Roney Marion, MD  clobetasol ointment (TEMOVATE) 0.05 % Apply topically twice daily to BODY as needed for SEVERE red, sandpaper and thickened like rash.  Do not use on face, groin or armpits.  Use for up to two weeks at a time. 01/13/22   Roney Marion, MD  DUPIXENT 300 MG/2ML prefilled syringe Inject into the skin. 01/30/22   [provider]  fluticasone (FLONASE) 50 MCG/ACT nasal spray SPRAY 2 SPRAYS INTO EACH NOSTRIL EVERY DAY 02/06/22    Roney Marion, MD  hydrOXYzine (ATARAX) 50 MG tablet Take 1 tablet (50 mg total) by mouth every 8 (eight) hours as needed for anxiety or itching. Need appointment for refills 01/20/22   Elsie Stain, MD  levocetirizine (XYZAL) 5 MG tablet Take 1 tablet (5 mg total) by mouth every evening. 01/13/22   Roney Marion, MD  montelukast (SINGULAIR) 10 MG tablet Take 1 tablet (10 mg total) by mouth at bedtime. 01/13/22   Roney Marion, MD  ondansetron (ZOFRAN-ODT) 4 MG disintegrating tablet Take 1 tablet (4 mg total) by mouth every 8 (eight) hours as needed for nausea or vomiting. 12/07/21   Teodora Medici, FNP  PARoxetine (PAXIL) 40 MG tablet Take 1 tablet (40 mg total) by mouth daily. Make appointment for refills 01/20/22   Elsie Stain, MD  tacrolimus (PROTOPIC) 0.1 % ointment Apply topically 2 (two) times daily. 01/13/22   Roney Marion, MD  triamcinolone ointment (KENALOG) 0.1 % Apply 1 Application topically 2 (two) times daily. 01/13/22   Roney Marion, MD  Vitamin D, Ergocalciferol, (DRISDOL) 1.25 MG (50000 UNIT) CAPS capsule Take 1 capsule (50,000 Units total) by mouth every 7 (seven) days. 03/02/21   Argentina Donovan, PA-C  famotidine (PEPCID) 20 MG tablet Take 1 tablet (20 mg total) by mouth 2 (two) times daily. 01/03/18 06/26/19  Rodell Perna A, PA-C  gabapentin (NEURONTIN) 100 MG capsule Take 1 capsule (100 mg total) by mouth 3 (three) times daily. 11/21/18 06/26/19  Domenic Moras, PA-C    Family History Family History  Problem Relation Age of Onset   Healthy Mother    Anxiety disorder Mother    Healthy Father    Stroke Maternal Grandmother    Stroke Paternal Grandfather    Bipolar disorder Neg Hx    Depression Neg Hx     Social History Social History   Tobacco Use   Smoking status: Former    Packs/day: 1.00    Years: 5.00    Additional pack years: 0.00    Total pack years: 5.00    Types: Cigarettes    Quit date: 2021    Years since quitting: 3.2   Smokeless  tobacco: Never  Vaping Use   Vaping Use: Every day  Substance Use Topics   Alcohol use: Yes    Comment: occasional   Drug use: No     Allergies   Doxycycline   Review of Systems Review of Systems Per HPI  Physical Exam Triage Vital Signs ED Triage Vitals [03/28/22 1545]  Enc Vitals Group     BP 139/85  Pulse Rate 92     Resp 17     Temp 98.1 F (36.7 C)     Temp Source Oral     SpO2 98 %     Weight      Height      Head Circumference      Peak Flow      Pain Score 0     Pain Loc      Pain Edu?      Excl. in [place]?    No data found.  Updated Vital Signs BP 139/85 (BP Location: Left Arm)   Pulse 92   Temp 98.1 F (36.7 C) (Oral)   Resp 17   SpO2 98%   Visual Acuity Right Eye Distance:   Left Eye Distance:   Bilateral Distance:    Right Eye Near:   Left Eye Near:    Bilateral Near:     Physical Exam Constitutional:      General: She is not in acute distress.    Appearance: Normal appearance. She is not toxic-appearing or diaphoretic.  HENT:     Head: Normocephalic and atraumatic.  Cardiovascular:     Rate and Rhythm: Normal rate and regular rhythm.     Pulses: Normal pulses.     Heart sounds: Normal heart sounds.  Pulmonary:     Effort: Pulmonary effort is normal. No respiratory distress.     Breath sounds: Normal breath sounds. No wheezing or rales.  Musculoskeletal:        General: Normal range of motion.     Cervical back: Normal range of motion.  Skin:    General: Skin is warm and dry.  Neurological:     General: No focal deficit present.     Mental Status: She is alert and oriented to person, place, and time. Mental status is at baseline.     Cranial Nerves: Cranial nerves 2-12 are intact.     Sensory: Sensation is intact.     Motor: Motor function is intact.     Coordination: Coordination is intact.     Gait: Gait is intact.  Psychiatric:        Mood and Affect: Mood normal.        Behavior: Behavior normal.      UC  Treatments / Results  Labs (all labs ordered are listed, but only abnormal results are displayed) Labs Reviewed  POCT FASTING CBG [facility] MANUAL ENTRY - Abnormal; Notable for the following components:      Result Value   POCT Glucose ([facility]) 112 (*)    All other components within normal limits    EKG   Radiology No results found.  Procedures Procedures (including critical care time)  Medications Ordered in UC Medications - No data to display  Initial Impression / Assessment and Plan / UC Course  I have reviewed the triage vital signs and the nursing notes.  Pertinent labs & imaging results that were available during my care of the patient were reviewed by me and considered in my medical decision making (see chart for details).     EKG completed that was normal sinus rhythm.  Vital signs are normal which is reassuring.  Advised patient that I do think that she needs more extensive evaluation than can be provided here in urgent care.  She was advised to go to the ER for further evaluation and management.  Patient was agreeable with plan.  Suggested EMS transport but patient declined wishing for her family  member to transport her.  Risks associated with not going by EMS were discussed with patient.  Patient voiced understanding, accepted risks,  and wishes self transport.  Patient left via self transport to go to the ER. Final Clinical Impressions(s) / UC Diagnoses   Final diagnoses:  Chest pain, unspecified type  Dizziness and giddiness     Discharge Instructions      Please go to the emergency department as soon as you leave urgent care for further evaluation and management.     ED Prescriptions   None    PDMP not reviewed this encounter.   Teodora Medici, [place] 03/28/22 [phone]

## 2022-03-28 NOTE — Discharge Instructions (Signed)
Please go to the emergency department as soon as you leave urgent care for further evaluation and management. ?

## 2022-03-29 ENCOUNTER — Other Ambulatory Visit: Payer: Self-pay

## 2022-03-29 ENCOUNTER — Emergency Department (HOSPITAL_COMMUNITY)
Admission: EM | Admit: 2022-03-29 | Discharge: 2022-03-29 | Disposition: A | Discharge status: Home / Self Care | Payer: Commercial Managed Care - HMO | Attending: Emergency Medicine | Admitting: Emergency Medicine

## 2022-03-29 ENCOUNTER — Emergency Department (HOSPITAL_COMMUNITY): Payer: Commercial Managed Care - HMO

## 2022-03-29 ENCOUNTER — Encounter (HOSPITAL_COMMUNITY): Payer: Self-pay

## 2022-03-29 DIAGNOSIS — J45909 Unspecified asthma, uncomplicated: Secondary | ICD-10-CM | POA: Diagnosis not present

## 2022-03-29 DIAGNOSIS — R0789 Other chest pain: Secondary | ICD-10-CM | POA: Insufficient documentation

## 2022-03-29 DIAGNOSIS — Z1152 Encounter for screening for COVID-19: Secondary | ICD-10-CM | POA: Insufficient documentation

## 2022-03-29 DIAGNOSIS — Z79899 Other long term (current) drug therapy: Secondary | ICD-10-CM | POA: Insufficient documentation

## 2022-03-29 DIAGNOSIS — R42 Dizziness and giddiness: Secondary | ICD-10-CM

## 2022-03-29 DIAGNOSIS — I10 Essential (primary) hypertension: Secondary | ICD-10-CM | POA: Diagnosis not present

## 2022-03-29 HISTORY — DX: Essential (primary) hypertension: I10

## 2022-03-29 LAB — BASIC METABOLIC PANEL
Anion gap: 8 (ref 5–15)
BUN: 9 mg/dL (ref 6–20)
CO2: 26 mmol/L (ref 22–32)
Calcium: 9.3 mg/dL (ref 8.9–10.3)
Chloride: 102 mmol/L (ref 98–111)
Creatinine, Ser: 0.76 mg/dL (ref 0.44–1.00)
GFR, Estimated: >60 mL/min (ref >60)
Glucose, Bld: 108 mg/dL — ABNORMAL HIGH (ref 70–99)
Potassium: 4.7 mmol/L (ref 3.5–5.1)
Sodium: 136 mmol/L (ref 135–145)

## 2022-03-29 LAB — RESP PANEL BY RT-PCR (RSV, FLU A&B, COVID)  RVPGX2
Influenza A by PCR: NEGATIVE
Influenza B by PCR: NEGATIVE
Resp Syncytial Virus by PCR: NEGATIVE
SARS Coronavirus 2 by RT PCR: NEGATIVE

## 2022-03-29 LAB — CBC
HCT: 43.3 % (ref 36.0–46.0)
Hemoglobin: 15 g/dL (ref 12.0–15.0)
MCH: 32 pg (ref 26.0–34.0)
MCHC: 34.6 g/dL (ref 30.0–36.0)
MCV: 92.3 fL (ref 80.0–100.0)
Platelets: 380 10*3/uL (ref 150–400)
RBC: 4.69 MIL/uL (ref 3.87–5.11)
RDW: 12.2 % (ref 11.5–15.5)
WBC: 7.4 10*3/uL (ref 4.0–10.5)
nRBC: 0 % (ref 0.0–0.2)

## 2022-03-29 LAB — I-STAT BETA HCG BLOOD, ED (MC, WL, AP ONLY): I-stat hCG, quantitative: <5 m[IU]/mL (ref <5)

## 2022-03-29 LAB — TROPONIN I (HIGH SENSITIVITY)
Troponin I (High Sensitivity): <2 ng/L (ref <18)
Troponin I (High Sensitivity): <2 ng/L (ref <18)

## 2022-03-29 MED ORDER — ACETAMINOPHEN 500 MG PO TABS
1000.0000 mg | ORAL_TABLET | Freq: Once | ORAL | Status: AC
  Administered 2022-03-29: 1000 mg via ORAL
  Filled 2022-03-29: qty 2

## 2022-03-29 MED ORDER — KETOROLAC TROMETHAMINE 15 MG/ML IJ SOLN
15.0000 mg | Freq: Once | INTRAMUSCULAR | Status: AC
  Administered 2022-03-29: 15 mg via INTRAVENOUS
  Filled 2022-03-29: qty 1

## 2022-03-29 NOTE — Discharge Instructions (Signed)
Thank you for coming to [facility] Emergency Department. You were seen for chest pain, dizziness. We did an exam, labs, and imaging, and these showed no acute findings. Please stay well hydrated at home. You can alternate taking Tylenol and ibuprofen as needed for pain. You can take 650mg  tylenol (acetaminophen) every 4-6 hours, and 600 mg ibuprofen 3 times a day.  Please follow up with your primary care provider within 1 week.   Do not hesitate to return to the ED or call 911 if you experience: -Worsening symptoms -Lightheadedness, passing out -Fevers/chills -Anything else that concerns you

## 2022-03-29 NOTE — ED Provider Notes (Signed)
[facility] Provider Note   CSN: [id number] Arrival date & time: 03/29/22  1537     History  Chief Complaint  Patient presents with   Chest Pain    [name] [name] is a 37 y.o. female with asthma, MDD/GAD, obesity, h/o tubal ligation, who presents with CP/SOB.   Pt arrived POV w/ c/o L sided CP that started yesterday along w/ shob. Pt reports she felt "loopy" in the head, dizziness, feeling "out of it," and her face was flushed per her coworker. Pt reports pain was initially stabbing and then pressure and tightness. Pain is intermittent, no alleviating/aggravating factors. Not pleuritic or positional. Currently more discomfort than pain. No h/o similar. A/w headache frontal bilateral, . Pt went to UC yesterday and was advised to come to ED. Pt reports she went home to rest to see if it got better. Pt also c/o body tingly sensation when walking. Denies cough, f/c, flu-like symptoms, abdominal pain, N/V, urinary symptoms, vaginal symptoms. +Diarrhea since Monday, ~5 episodes per day, denies melena/hematochezia. Dizziness worse with sitting/standing. Reports normal PO intake. Denies sick contacts.    Chest Pain      Home Medications Prior to Admission medications   Medication Sig Start Date End Date Taking? Authorizing Provider  albuterol (VENTOLIN HFA) 108 (90 Base) MCG/ACT inhaler Inhale 2 puffs into the lungs every 6 (six) hours as needed for wheezing or shortness of breath. 01/20/22   Elsie Stain, MD  amLODipine (NORVASC) 5 MG tablet Take 1 tablet (5 mg total) by mouth daily. Need appointment for refills 01/20/22   Elsie Stain, MD  budesonide-formoterol [facility]) 160-4.5 MCG/ACT inhaler Inhale 2 puffs into the lungs in the morning and at bedtime. 01/13/22   Roney Marion, MD  clobetasol ointment (TEMOVATE) 0.05 % Apply topically twice daily to BODY as needed for SEVERE red, sandpaper and thickened like rash.  Do not use on  face, groin or armpits.  Use for up to two weeks at a time. 01/13/22   Roney Marion, MD  DUPIXENT 300 MG/2ML prefilled syringe Inject into the skin. 01/30/22   [provider]  fluticasone (FLONASE) 50 MCG/ACT nasal spray SPRAY 2 SPRAYS INTO EACH NOSTRIL EVERY DAY 02/06/22   Roney Marion, MD  hydrOXYzine (ATARAX) 50 MG tablet Take 1 tablet (50 mg total) by mouth every 8 (eight) hours as needed for anxiety or itching. Need appointment for refills 01/20/22   Elsie Stain, MD  levocetirizine (XYZAL) 5 MG tablet Take 1 tablet (5 mg total) by mouth every evening. 01/13/22   Roney Marion, MD  montelukast (SINGULAIR) 10 MG tablet Take 1 tablet (10 mg total) by mouth at bedtime. 01/13/22   Roney Marion, MD  ondansetron (ZOFRAN-ODT) 4 MG disintegrating tablet Take 1 tablet (4 mg total) by mouth every 8 (eight) hours as needed for nausea or vomiting. 12/07/21   Teodora Medici, FNP  PARoxetine (PAXIL) 40 MG tablet Take 1 tablet (40 mg total) by mouth daily. Make appointment for refills 01/20/22   Elsie Stain, MD  tacrolimus (PROTOPIC) 0.1 % ointment Apply topically 2 (two) times daily. 01/13/22   Roney Marion, MD  triamcinolone ointment (KENALOG) 0.1 % Apply 1 Application topically 2 (two) times daily. 01/13/22   Roney Marion, MD  Vitamin D, Ergocalciferol, (DRISDOL) 1.25 MG (50000 UNIT) CAPS capsule Take 1 capsule (50,000 Units total) by mouth every 7 (seven) days. 03/02/21   Argentina Donovan, PA-C  famotidine (PEPCID) 20 MG  tablet Take 1 tablet (20 mg total) by mouth 2 (two) times daily. 01/03/18 06/26/19  Rodell Perna A, PA-C  gabapentin (NEURONTIN) 100 MG capsule Take 1 capsule (100 mg total) by mouth 3 (three) times daily. 11/21/18 06/26/19  Domenic Moras, PA-C      Allergies    Doxycycline    Review of Systems   Review of Systems  Cardiovascular:  Positive for chest pain.   Review of systems Negative for f/c.  A 10 point review of systems was performed and is negative unless  otherwise reported in HPI.  Physical Exam Updated Vital Signs BP (!) 137/90   Pulse 88   Temp 98.3 F (36.8 C) (Oral)   Resp 19   Ht 5' (1.524 m)   Wt 99.8 kg   SpO2 100%   BMI 42.97 kg/m  Physical Exam General: Normal appearing female, lying in bed.  HEENT: PERRLA, EOMI, no nystagmus, Sclera anicteric, MMM, trachea midline.  Cardiology: RRR, no murmurs/rubs/gallops. BL radial and DP pulses equal bilaterally.  Resp: Normal respiratory rate and effort. CTAB, no wheezes, rhonchi, crackles.  Abd: Soft, non-tender, non-distended. No rebound tenderness or guarding.  GU: Deferred. MSK: No peripheral edema or signs of trauma. Extremities without deformity or TTP. No cyanosis or clubbing. Skin: warm, dry. Neuro: A&Ox4, CNs II-XII grossly intact. MAEs. Sensation grossly intact.  Psych: Normal mood and affect.   ED Results / Procedures / Treatments   Labs (all labs ordered are listed, but only abnormal results are displayed) Labs Reviewed  BASIC METABOLIC PANEL - Abnormal; Notable for the following components:      Result Value   Glucose, Bld 108 (*)    All other components within normal limits  RESP PANEL BY RT-PCR (RSV, FLU A&B, COVID)  [id number]  CBC  I-STAT BETA HCG BLOOD, ED ([facility], [facility], [facility] ONLY)  TROPONIN I (HIGH SENSITIVITY)  TROPONIN I (HIGH SENSITIVITY)    EKG EKG Interpretation  Date/Time:  Wednesday March 29 2022 15:45:29 EDT Ventricular Rate:  93 PR Interval:  156 QRS Duration: 74 QT Interval:  362 QTC Calculation: 450 R Axis:   59 Text Interpretation: Normal sinus rhythm with sinus arrhythmia Normal ECG When compared with ECG of 28-Mar-2022 16:13, PREVIOUS ECG IS PRESENT Confirmed by Cindee Lame [phone]) on 03/29/2022 7:12:20 PM  Radiology DG Chest 2 View  Result Date: 03/29/2022 CLINICAL DATA:  Left-sided chest pain and shortness of breath. EXAM: CHEST - 2 VIEW COMPARISON:  October 01, 2017 FINDINGS: The heart size and mediastinal contours are within normal  limits. Both lungs are clear. The visualized skeletal structures are unremarkable. IMPRESSION: No active cardiopulmonary disease. Electronically Signed   By: Virgina Norfolk [name]   On: 03/29/2022 17:01    Procedures Procedures    Medications Ordered in ED Medications  acetaminophen (TYLENOL) tablet 1,000 mg (1,000 mg Oral Given 03/29/22 2002)  ketorolac (TORADOL) 15 MG/ML injection 15 mg (15 mg Intravenous Given 03/29/22 2002)    ED Course/ Medical Decision Making/ A&P                          Medical Decision Making Amount and/or Complexity of Data Reviewed Labs: ordered. Decision-making details documented in ED Course. Radiology: ordered.  Risk OTC drugs. Prescription drug management.    This patient presents to the ED for concern of CP/SOB, this involves an extensive number of treatment options, and is a complaint that carries with it a high risk of complications and  morbidity.  I considered the following differential and admission for this acute, potentially life threatening condition.  However patient is overall very well-appearing and VSS.   MDM:    DDX for chest pain includes but is not limited to:  ACS/arrhythmia,  PE, aortic dissection, PNA, PTX, GERD/PUD/gastritis, or musculoskeletal pain. Very low suspicion for ACS vs aortic dissection given presenting sx. Patient PERCs out for PE, no signs/symptoms of DVT. No c/f dissection. No abdominal pain and no c/f biliary disease. Consider GERD, given known history. For dizziness, consider orthostatic hypotension, electrolyte derangements, dehydration, hypo/hyperglycemia. No nystagmus or FNDs to consider CVA or intracranial abnormality.   Clinical Course as of 03/29/22 2137  Wed Mar 29, 2022  1911 I-stat hCG, quantitative: <5.0 [HN]  1911 Troponin I (High Sensitivity): <2 [HN]  [id number] Basic metabolic panel(!) unremarkable [HN]  1912 CBC wnl [HN]  2036 Troponin I (High Sensitivity): <2 Repeat trop neg [HN]  2119 Resp panel  by RT-PCR (RSV, Flu A&B, Covid) Anterior Nasal Swab Neg [HN]  2135 Patient feeling a bit better, orthostatics were negative. Lab w/u reassuring, PERC negative. Heart score 1. Patient will be DC'd w/ PCP f/u. DC w/ discharge instructions/return precautions. All questions answered to patient's satisfaction.   [HN]    Clinical Course User Index [HN] Audley Hose, MD    Labs: I Ordered, and personally interpreted labs.  The pertinent results include:  those listed above  Imaging Studies ordered: I ordered imaging studies including CXR I independently visualized and interpreted imaging. I agree with the radiologist interpretation  Additional history obtained from boyfriend at bedside, chart review.   Cardiac Monitoring: The patient was maintained on a cardiac monitor.  I personally viewed and interpreted the cardiac monitored which showed an underlying rhythm of: NSR  Reevaluation: After the interventions noted above, I reevaluated the patient and found that they have :improved  Social Determinants of Health: Patient lives independently   Disposition:  DC  Co morbidities that complicate the patient evaluation  Past Medical History:  Diagnosis Date   Admission for sterilization 06/12/2013   Asthma    Breast abscess    Depression    HX of - not on med n, doing well.   Eczema    Family history of anesthesia complication    Grandmother has difficulty waskin up.   Hypertension    Pneumonia    HX only -years ago has had a few times.   Pregnant state, incidental    S/P tubal ligation 06/13/2013   SVD (spontaneous vaginal delivery)    x 2     Medicines Meds ordered this encounter  Medications   acetaminophen (TYLENOL) tablet 1,000 mg   ketorolac (TORADOL) 15 MG/ML injection 15 mg    I have reviewed the patients home medicines and have made adjustments as needed  Problem List / ED Course: Problem List Items Addressed This Visit   None Visit Diagnoses     Atypical  chest pain    -  Primary   Dizziness                       This note was created using dictation software, which may contain spelling or grammatical errors.    Audley Hose, MD 03/29/22 2139

## 2022-03-29 NOTE — ED Triage Notes (Signed)
Pt arrived POV w/ c/o L sided CP that started yesterday along w/ shob. Pt reports she felt "loopy" in the head and her face was flushed per her coworker. Pt reports pain was initially stabbing and then pressure and tightness. Pt reports then she got a headache. Pt went to UC yesterday and was advised to come to ED. Pt reports she went home to rest to see if it got better. Pt reports pain has been intermittent since onset. Pt also c/o body tingly sensation when walking.

## 2022-03-30 ENCOUNTER — Other Ambulatory Visit (HOSPITAL_COMMUNITY): Payer: Self-pay

## 2022-03-31 ENCOUNTER — Other Ambulatory Visit (HOSPITAL_COMMUNITY): Payer: Self-pay

## 2022-03-31 ENCOUNTER — Other Ambulatory Visit: Payer: Self-pay | Admitting: Pharmacist

## 2022-03-31 ENCOUNTER — Other Ambulatory Visit: Payer: Self-pay

## 2022-03-31 ENCOUNTER — Telehealth: Payer: Self-pay | Admitting: Critical Care Medicine

## 2022-03-31 DIAGNOSIS — F411 Generalized anxiety disorder: Secondary | ICD-10-CM

## 2022-03-31 DIAGNOSIS — L309 Dermatitis, unspecified: Secondary | ICD-10-CM

## 2022-03-31 DIAGNOSIS — J4540 Moderate persistent asthma, uncomplicated: Secondary | ICD-10-CM

## 2022-03-31 MED ORDER — HYDROXYZINE HCL 50 MG PO TABS: 50.0000 mg | ORAL_TABLET | Freq: Three times a day (TID) | ORAL | 0 refills | Status: DC | PRN

## 2022-03-31 MED ORDER — ALBUTEROL SULFATE HFA 108 (90 BASE) MCG/ACT IN AERS: 2.0000 | INHALATION_SPRAY | Freq: Four times a day (QID) | RESPIRATORY_TRACT | 0 refills | Status: DC | PRN

## 2022-03-31 MED ORDER — PAROXETINE HCL 40 MG PO TABS: 40.0000 mg | ORAL_TABLET | Freq: Every day | ORAL | 0 refills | Status: DC

## 2022-03-31 NOTE — Telephone Encounter (Signed)
Pt called in to reschedule her appt to 06/15/22 for her medication refill.  Pt is wanting to know if her PCP will be able to call in her medications until her appt.     albuterol (VENTOLIN HFA) 108 (90 Base) MCG/ACT inhaler  hydrOXYzine (ATARAX) 50 MG tablet  PARoxetine (PAXIL) 40 MG tablet     [id number] - , [place] - [place]. Phone: [phone]  Fax: [phone]

## 2022-03-31 NOTE — Telephone Encounter (Signed)
Sent to last until upcoming appt.

## 2022-04-06 ENCOUNTER — Other Ambulatory Visit: Payer: Self-pay | Admitting: Critical Care Medicine

## 2022-04-06 DIAGNOSIS — L309 Dermatitis, unspecified: Secondary | ICD-10-CM

## 2022-04-06 NOTE — Telephone Encounter (Signed)
Refused Atarax 50 mg.  This is a duplicate request.

## 2022-04-22 ENCOUNTER — Other Ambulatory Visit: Payer: Self-pay | Admitting: Critical Care Medicine

## 2022-04-22 DIAGNOSIS — J4540 Moderate persistent asthma, uncomplicated: Secondary | ICD-10-CM

## 2022-04-24 ENCOUNTER — Other Ambulatory Visit (HOSPITAL_COMMUNITY): Payer: Self-pay

## 2022-04-24 ENCOUNTER — Other Ambulatory Visit: Payer: Self-pay | Admitting: Critical Care Medicine

## 2022-04-24 DIAGNOSIS — F411 Generalized anxiety disorder: Secondary | ICD-10-CM

## 2022-04-24 DIAGNOSIS — R030 Elevated blood-pressure reading, without diagnosis of hypertension: Secondary | ICD-10-CM

## 2022-04-24 DIAGNOSIS — J4540 Moderate persistent asthma, uncomplicated: Secondary | ICD-10-CM

## 2022-04-24 DIAGNOSIS — L309 Dermatitis, unspecified: Secondary | ICD-10-CM

## 2022-04-24 MED ORDER — AMLODIPINE BESYLATE 5 MG PO TABS
5.0000 mg | ORAL_TABLET | Freq: Every day | ORAL | 0 refills | Status: DC
Start: 2022-04-24 — End: 2022-06-15
  Filled 2022-04-24: qty 30, 30d supply, fill #0

## 2022-04-24 NOTE — Telephone Encounter (Signed)
Requested Prescriptions  Pending Prescriptions Disp Refills   albuterol (VENTOLIN HFA) 108 (90 Base) MCG/ACT inhaler [Pharmacy Med Name: ALBUTEROL HFA (PROAIR) INHALER] 8.5 each 1    Sig: TAKE 2 PUFFS BY MOUTH EVERY 6 HOURS AS NEEDED FOR WHEEZE OR SHORTNESS OF BREATH     Pulmonology:  Beta Agonists 2 Passed - 04/22/2022  3:48 AM      Passed - Last BP in normal range    BP Readings from Last 1 Encounters:  03/29/22 128/75         Passed - Last Heart Rate in normal range    Pulse Readings from Last 1 Encounters:  03/29/22 80         Passed - Valid encounter within last 12 months    Recent Outpatient Visits           8 months ago Primary hypertension   [place] [facility] Storm Frisk, MD   9 months ago Left-sided headache   [place] [place], [place], [place]   1 year ago Weight gain   [facility] [place], [place], [place]   1 year ago Eczema, unspecified type   [facility] Storm Frisk, MD   2 years ago GAD (generalized anxiety disorder)   [facility] Storm Frisk, MD       Future Appointments             In 1 month D[place] Charlcie Cradle, MD [facility]   In 1 month Ferol Luz, MD  [facility]

## 2022-04-25 ENCOUNTER — Other Ambulatory Visit: Payer: Self-pay

## 2022-04-26 ENCOUNTER — Other Ambulatory Visit (HOSPITAL_COMMUNITY): Payer: Self-pay

## 2022-04-26 MED ORDER — HYDROXYZINE HCL 50 MG PO TABS
50.0000 mg | ORAL_TABLET | Freq: Three times a day (TID) | ORAL | 0 refills | Status: DC | PRN
Start: 2022-04-26 — End: 2022-06-15
  Filled 2022-04-26: qty 90, 30d supply, fill #0

## 2022-04-26 MED ORDER — ALBUTEROL SULFATE HFA 108 (90 BASE) MCG/ACT IN AERS
2.0000 | INHALATION_SPRAY | Freq: Four times a day (QID) | RESPIRATORY_TRACT | 0 refills | Status: DC | PRN
Start: 2022-04-26 — End: 2022-05-26
  Filled 2022-04-26: qty 6.7, 25d supply, fill #0

## 2022-04-26 MED ORDER — PAROXETINE HCL 40 MG PO TABS
40.0000 mg | ORAL_TABLET | Freq: Every day | ORAL | 0 refills | Status: DC
Start: 2022-04-26 — End: 2022-06-15
  Filled 2022-04-26: qty 90, 90d supply, fill #0

## 2022-04-27 ENCOUNTER — Other Ambulatory Visit (HOSPITAL_COMMUNITY): Payer: Self-pay

## 2022-04-27 ENCOUNTER — Other Ambulatory Visit: Payer: Self-pay

## 2022-05-26 ENCOUNTER — Other Ambulatory Visit: Payer: Self-pay | Admitting: Critical Care Medicine

## 2022-05-26 DIAGNOSIS — J4540 Moderate persistent asthma, uncomplicated: Secondary | ICD-10-CM

## 2022-05-26 MED ORDER — ALBUTEROL SULFATE HFA 108 (90 BASE) MCG/ACT IN AERS: 2.0000 | INHALATION_SPRAY | Freq: Four times a day (QID) | RESPIRATORY_TRACT | 0 refills | Status: DC | PRN

## 2022-05-31 ENCOUNTER — Ambulatory Visit
Admission: EM | Admit: 2022-05-31 | Discharge: 2022-05-31 | Disposition: A | Discharge status: Home / Self Care | Payer: Commercial Managed Care - HMO | Attending: Emergency Medicine | Admitting: Emergency Medicine

## 2022-05-31 DIAGNOSIS — J4521 Mild intermittent asthma with (acute) exacerbation: Secondary | ICD-10-CM | POA: Diagnosis not present

## 2022-05-31 DIAGNOSIS — J069 Acute upper respiratory infection, unspecified: Secondary | ICD-10-CM | POA: Diagnosis not present

## 2022-05-31 MED ORDER — PREDNISONE 20 MG PO TABS: 40.0000 mg | ORAL_TABLET | Freq: Every day | ORAL | 0 refills | Status: DC

## 2022-05-31 MED ORDER — BENZONATATE 100 MG PO CAPS: 100.0000 mg | ORAL_CAPSULE | Freq: Three times a day (TID) | ORAL | 0 refills | Status: DC

## 2022-05-31 MED ORDER — PROMETHAZINE-DM 6.25-15 MG/5ML PO SYRP: 5.0000 mL | ORAL_SOLUTION | Freq: Four times a day (QID) | ORAL | 0 refills | Status: DC | PRN

## 2022-05-31 MED ORDER — AZITHROMYCIN 250 MG PO TABS: 250.0000 mg | ORAL_TABLET | Freq: Every day | ORAL | 0 refills | Status: DC

## 2022-05-31 NOTE — ED Triage Notes (Signed)
Pt reports since last Thursday she has had a cough that is making her throat scratchy. . She had a fever and says sxs are effecting her asthma. Pt has used her inhaler but no relief.

## 2022-05-31 NOTE — Discharge Instructions (Signed)
Today you are being treated for inflammation to your upper airways likely due to a viral illness  Take azithromycin prophylactically to prevent bacteria from entering the airways and causing complications of illness  Starting  tomorrow begin use of prednisone every morning with food for 5 days to help reduce inflammation   May use Tessalon pill every 8 hours as needed to help calm your coughing  May use promethazine DM for coughing and additional comfort, be mindful this medication may make you drowsy  In addition:  Maintaining adequate hydration may help to thin secretions and soothe the respiratory mucosa   Warm Liquids- Ingestion of warm liquids may have a soothing effect on the respiratory mucosa, increase the flow of nasal mucus, and loosen respiratory secretions, making them easier to remove  May try honey (2.5 to 5 mL [0.5 to 1 teaspoon]) can be given straight or diluted in liquid (juice). Corn syrup may be substituted if honey is not available.

## 2022-05-31 NOTE — ED Provider Notes (Signed)
[facility]    CSN: [id number] Arrival date & time: 05/31/22  1654      History   Chief Complaint No chief complaint on file.   HPI [name] [name] is a 37 y.o. female.   Patient presents for evaluation of fever, chills, nasal congestion, rhinorrhea, sore throat, hoarseness, cough shortness of breath and wheezing present for 6 days.  Cough became productive today with green sputum, experiencing shortness of breath at rest.  Fever and chills have resolved.  Decreased appetite but tolerating some food and fluids.  No known sick contacts prior.  Has attempted use of ibuprofen and albuterol and inhaler, increased usage from baseline has not used at least 4-5 times within a day.  History of asthma.  Former smoker.      Past Medical History:  Diagnosis Date   Admission for sterilization 06/12/2013   Asthma    Breast abscess    Depression    HX of - not on med n, doing well.   Eczema    Family history of anesthesia complication    Grandmother has difficulty waskin up.   Hypertension    Pneumonia    HX only -years ago has had a few times.   Pregnant state, incidental    S/P tubal ligation 06/13/2013   SVD (spontaneous vaginal delivery)    x 2    Patient Active Problem List   Diagnosis Date Noted   Other allergic rhinitis 02/14/2022   Vapes nicotine containing substance 02/14/2022   Weight gain 04/04/2021   Women's annual routine gynecological examination 06/10/2020   Screening examination for STD (sexually transmitted disease) 06/10/2020   Former tobacco use 12/07/2014   Major depressive disorder, recurrent episode, moderate (HCC) 01/13/2014   GAD (generalized anxiety disorder) 01/13/2014   S/P tubal ligation 06/13/2013   Conjunctivitis 10/14/2012   Asthma 03/01/2006   Intrinsic atopic dermatitis 03/01/2006    Past Surgical History:  Procedure Laterality Date   INCISE AND DRAIN ABCESS     left breast x 2   INCISION AND DRAINAGE ABSCESS Left  02/17/2012   Procedure:  DRAINAGEof recurrent breast abscess;  Surgeon: Shelly Rubenstein, MD;  Location: [facility];  Service: General;  Laterality: Left;   LAPAROSCOPIC TUBAL LIGATION Bilateral 06/13/2013   Procedure: LAPAROSCOPIC TUBAL LIGATION;  Surgeon: Sherian Rein, MD;  Location: [facility]S;  Service: Gynecology;  Laterality: Bilateral;   TUBAL LIGATION      OB History     Gravida  2   Para  2   Term  1   Preterm  1   AB      Living  2      SAB      IAB      Ectopic      Multiple      Live Births  2            Home Medications    Prior to Admission medications   Medication Sig Start Date End Date Taking? Authorizing Provider  albuterol (VENTOLIN HFA) 108 (90 Base) MCG/ACT inhaler Inhale 2 puffs into the lungs every 6 (six) hours as needed for wheezing or shortness of breath. 05/26/22   Storm Frisk, MD  amLODipine (NORVASC) 5 MG tablet Take 1 tablet (5 mg total) by mouth daily. Need appointment for refills 04/24/22   Storm Frisk, MD  budesonide-formoterol [facility]) 160-4.5 MCG/ACT inhaler Inhale 2 puffs into the lungs in the morning and at bedtime. 01/13/22   Ferol Luz,  MD  clobetasol ointment (TEMOVATE) 0.05 % Apply topically twice daily to BODY as needed for SEVERE red, sandpaper and thickened like rash.  Do not use on face, groin or armpits.  Use for up to two weeks at a time. 01/13/22   Ferol Luz, MD  DUPIXENT 300 MG/2ML prefilled syringe Inject into the skin. 01/30/22   [provider]  fluticasone (FLONASE) 50 MCG/ACT nasal spray SPRAY 2 SPRAYS INTO EACH NOSTRIL EVERY DAY 02/06/22   Ferol Luz, MD  hydrOXYzine (ATARAX) 50 MG tablet Take 1 tablet (50 mg total) by mouth every 8 (eight) hours as needed for anxiety or itching. Must keep appt in June for additional refills. 04/26/22   Hoy Register, MD  levocetirizine (XYZAL) 5 MG tablet Take 1 tablet (5 mg total) by mouth every evening. 01/13/22   Ferol Luz, MD   montelukast (SINGULAIR) 10 MG tablet Take 1 tablet (10 mg total) by mouth at bedtime. 01/13/22   Ferol Luz, MD  ondansetron (ZOFRAN-ODT) 4 MG disintegrating tablet Take 1 tablet (4 mg total) by mouth every 8 (eight) hours as needed for nausea or vomiting. 12/07/21   Gustavus Bryant, FNP  PARoxetine (PAXIL) 40 MG tablet Take 1 tablet (40 mg total) by mouth daily. Must keep appt in June for additional refills. 04/26/22   Hoy Register, MD  tacrolimus (PROTOPIC) 0.1 % ointment Apply topically 2 (two) times daily. 01/13/22   Ferol Luz, MD  triamcinolone ointment (KENALOG) 0.1 % Apply 1 Application topically 2 (two) times daily. 01/13/22   Ferol Luz, MD  Vitamin D, Ergocalciferol, (DRISDOL) 1.25 MG (50000 UNIT) CAPS capsule Take 1 capsule (50,000 Units total) by mouth every 7 (seven) days. 03/02/21   Anders Simmonds, PA-C  famotidine (PEPCID) 20 MG tablet Take 1 tablet (20 mg total) by mouth 2 (two) times daily. 01/03/18 06/26/19  Michela Pitcher A, PA-C  gabapentin (NEURONTIN) 100 MG capsule Take 1 capsule (100 mg total) by mouth 3 (three) times daily. 11/21/18 06/26/19  Fayrene Helper, PA-C    Family History Family History  Problem Relation Age of Onset   Healthy Mother    Anxiety disorder Mother    Healthy Father    Stroke Maternal Grandmother    Stroke Paternal Grandfather    Bipolar disorder Neg Hx    Depression Neg Hx     Social History Social History   Tobacco Use   Smoking status: Former    Packs/day: 1.00    Years: 5.00    Additional pack years: 0.00    Total pack years: 5.00    Types: Cigarettes    Quit date: 2021    Years since quitting: 3.4   Smokeless tobacco: Never  Vaping Use   Vaping Use: Every day  Substance Use Topics   Alcohol use: Yes    Comment: occasional   Drug use: No     Allergies   Doxycycline   Review of Systems Review of Systems  Constitutional:  Positive for chills and fever. Negative for activity change, appetite change, diaphoresis,  fatigue and unexpected weight change.  HENT:  Positive for congestion, rhinorrhea, sore throat and voice change. Negative for dental problem, drooling, ear discharge, ear pain, facial swelling, hearing loss, mouth sores, nosebleeds, postnasal drip, sinus pressure, sinus pain, sneezing, tinnitus and trouble swallowing.   Respiratory:  Positive for cough, shortness of breath and wheezing. Negative for apnea, choking, chest tightness and stridor.   Cardiovascular: Negative.   Gastrointestinal: Negative.   Skin: Negative.  Neurological: Negative.      Physical Exam Triage Vital Signs ED Triage Vitals  Enc Vitals Group     BP 05/31/22 1703 126/75     Pulse Rate 05/31/22 1703 (!) 106     Resp 05/31/22 1703 20     Temp 05/31/22 1703 98 F (36.7 C)     Temp Source 05/31/22 1703 Oral     SpO2 --      Weight --      Height --      Head Circumference --      Peak Flow --      Pain Score 05/31/22 1706 2     Pain Loc --      Pain Edu? --      Excl. in GC? --    No data found.  Updated Vital Signs BP 126/75 (BP Location: Left Arm)   Pulse (!) 106   Temp 98 F (36.7 C) (Oral)   Resp 20   LMP 05/17/2022 (Within Weeks)   Visual Acuity Right Eye Distance:   Left Eye Distance:   Bilateral Distance:    Right Eye Near:   Left Eye Near:    Bilateral Near:     Physical Exam Constitutional:      Appearance: Normal appearance.  HENT:     Right Ear: Tympanic membrane, ear canal and external ear normal.     Left Ear: Tympanic membrane, ear canal and external ear normal.     Nose: Congestion and rhinorrhea present.     Mouth/Throat:     Mouth: Mucous membranes are moist.     Pharynx: No posterior oropharyngeal erythema.  Eyes:     Extraocular Movements: Extraocular movements intact.  Cardiovascular:     Rate and Rhythm: Normal rate and regular rhythm.     Pulses: Normal pulses.     Heart sounds: Normal heart sounds.  Pulmonary:     Effort: Pulmonary effort is normal.      Breath sounds: Normal breath sounds.  Skin:    General: Skin is warm and dry.  Neurological:     General: No focal deficit present.     Mental Status: She is alert and oriented to person, place, and time. Mental status is at baseline.      UC Treatments / Results  Labs (all labs ordered are listed, but only abnormal results are displayed) Labs Reviewed - No data to display  EKG   Radiology No results found.  Procedures Procedures (including critical care time)  Medications Ordered in UC Medications - No data to display  Initial Impression / Assessment and Plan / UC Course  I have reviewed the triage vital signs and the nursing notes.  Pertinent labs & imaging results that were available during my care of the patient were reviewed by me and considered in my medical decision making (see chart for details).  Acute upper respiratory infection, mild intermittent asthma with acute exacerbation  Etiology is most likely viral exacerbating asthma, discussed with patient, vital signs are stable and she is in no signs of distress nontoxic-appearing, lungs are clear but diminished therefore imaging deferred, low suspicion for pneumonia, Z-Pak prescribed prophylactically for treatment of exacerbation prescribed prednisone burst, Promethazine DM and Tessalon, endorses medication on inhaler and does not need refill, advised continued use as needed, may use additional over-the-counter medications with follow-up with urgent care as needed if symptoms persist or worsen Final Clinical Impressions(s) / UC Diagnoses   Final diagnoses:  None  Discharge Instructions   None    ED Prescriptions   None    PDMP not reviewed this encounter.   Valinda Hoar, NP 05/31/22 1734

## 2022-06-12 NOTE — Progress Notes (Unsigned)
Established Office Visit  Subjective:     Patient ID: [name] [name], female    DOB: 06-06-1985, 37 y.o.   MRN: [id number]    HPI 05/25/22  Patient is in today for patient is seen by way of a video visit for depression.  Patient states she has had worsening depression over the past month.  She was having headaches and was seen recently started on amlodipine 5 mg daily for elevated blood pressure.  Blood pressure now is in the 120 over 70s.  Patient states she is on the Paxil for anxiety this works well.  However her depression is gotten worse.  She does not drink alcohol smoke or use substances.  She needs follow-up labs.  She works Education officer, environmental houses.  She is sleeping well there is no disruptions there.  The patient is not suicidal  06/15/22 Patient seen in return follow-up last visit was a video visit.  Patient's mental health is much improved no longer having anxiety breath.  Her eczema has improved since she has been placed on 2 packs of allergy and asthma.  She needs her vitamin D level rechecked.   Review of Systems  Constitutional:  Negative for chills, diaphoresis, fever, malaise/fatigue and weight loss.  HENT:  Negative for congestion, hearing loss, nosebleeds, sore throat and tinnitus.   Eyes:  Negative for blurred vision, photophobia and redness.  Respiratory:  Negative for cough, hemoptysis, sputum production, shortness of breath, wheezing and stridor.   Cardiovascular:  Negative for chest pain, palpitations, orthopnea, claudication, leg swelling and PND.  Gastrointestinal:  Negative for abdominal pain, blood in stool, constipation, diarrhea, heartburn, nausea and vomiting.  Genitourinary:  Negative for dysuria, flank pain, frequency, hematuria and urgency.  Musculoskeletal:  Negative for back pain, falls, joint pain, myalgias and neck pain.  Skin:  Negative for itching and rash.  Neurological:  Negative for dizziness, tingling, tremors, sensory change, speech change, focal  weakness, seizures, loss of consciousness, weakness and headaches.  Endo/Heme/Allergies:  Negative for environmental allergies and polydipsia. Does not bruise/bleed easily.  Psychiatric/Behavioral:  Negative for depression, hallucinations, memory loss, substance abuse and suicidal ideas. The patient is not nervous/anxious and does not have insomnia.         Objective:    BP 124/85 (BP Location: Left Arm, Patient Position: Sitting, Cuff Size: Large)   Pulse (!) 106   Temp 98.4 F (36.9 C) (Oral)   Ht 5' (1.524 m)   Wt 221 lb (100.2 kg)   LMP 05/17/2022 (Within Weeks)   SpO2 96%   BMI 43.16 kg/m  BP Readings from Last 3 Encounters:  06/15/22 124/85  05/31/22 126/75  03/29/22 128/75   Wt Readings from Last 3 Encounters:  06/15/22 221 lb (100.2 kg)  03/29/22 220 lb (99.8 kg)  01/24/22 220 lb (99.8 kg)      Physical Exam Vitals reviewed.  Constitutional:      Appearance: Normal appearance. She is well-developed. She is not diaphoretic.  HENT:     Head: Normocephalic and atraumatic.     Nose: No nasal deformity, septal deviation, mucosal edema or rhinorrhea.     Right Sinus: No maxillary sinus tenderness or frontal sinus tenderness.     Left Sinus: No maxillary sinus tenderness or frontal sinus tenderness.     Mouth/Throat:     Pharynx: No oropharyngeal exudate.  Eyes:     General: No scleral icterus.    Conjunctiva/sclera: Conjunctivae normal.     Pupils: Pupils are equal, round, and reactive  to light.  Neck:     Thyroid: No thyromegaly.     Vascular: No carotid bruit or JVD.     Trachea: Trachea normal. No tracheal tenderness or tracheal deviation.  Cardiovascular:     Rate and Rhythm: Normal rate and regular rhythm.     Chest Wall: PMI is not displaced.     Pulses: Normal pulses. No decreased pulses.     Heart sounds: Normal heart sounds, S1 normal and S2 normal. Heart sounds not distant. No murmur heard.    No systolic murmur is present.     No diastolic murmur  is present.     No friction rub. No gallop. No S3 or S4 sounds.  Pulmonary:     Effort: No tachypnea, accessory muscle usage or respiratory distress.     Breath sounds: No stridor. No decreased breath sounds, wheezing, rhonchi or rales.  Chest:     Chest wall: No tenderness.  Abdominal:     General: Bowel sounds are normal. There is no distension.     Palpations: Abdomen is soft. Abdomen is not rigid.     Tenderness: There is no abdominal tenderness. There is no guarding or rebound.  Musculoskeletal:        General: Normal range of motion.     Cervical back: Normal range of motion and neck supple. No edema, erythema or rigidity. No muscular tenderness. Normal range of motion.  Lymphadenopathy:     Head:     Right side of head: No submental or submandibular adenopathy.     Left side of head: No submental or submandibular adenopathy.     Cervical: No cervical adenopathy.  Skin:    General: Skin is warm and dry.     Coloration: Skin is not pale.     Findings: No rash.     Nails: There is no clubbing.  Neurological:     Mental Status: She is alert and oriented to person, place, and time.     Sensory: No sensory deficit.  Psychiatric:        Speech: Speech normal.        Behavior: Behavior normal.    No exam this is a video visit the patient is in no distress on the video No results found for any visits on 06/15/22.      Assessment & Plan:   Problem List Items Addressed This Visit       Cardiovascular and Mediastinum    Continue amlodipine is at goal      Primary hypertension - Primary   Relevant Medications   amLODipine (NORVASC) 5 MG tablet     Respiratory    Asthma much improved management per allergy        Musculoskeletal and Integument   Intrinsic atopic dermatitis    Improved on dupixent      Relevant Medications   hydrOXYzine (ATARAX) 50 MG tablet     Other   Major depressive disorder, recurrent episode, moderate (HCC)    Improved on Paxil       Relevant Medications   hydrOXYzine (ATARAX) 50 MG tablet   PARoxetine (PAXIL) 40 MG tablet   GAD (generalized anxiety disorder)   Relevant Medications   hydrOXYzine (ATARAX) 50 MG tablet   PARoxetine (PAXIL) 40 MG tablet   Other Visit Diagnoses     Eczema, unspecified type       Relevant Medications   hydrOXYzine (ATARAX) 50 MG tablet   Vitamin D deficiency  Relevant Medications   Vitamin D, Ergocalciferol, (DRISDOL) 1.25 MG (50000 UNIT) CAPS capsule   Other Relevant Orders   VITAMIN D 25 Hydroxy (Vit-D Deficiency, Fractures)      Meds ordered this encounter  Medications   amLODipine (NORVASC) 5 MG tablet    Sig: Take 1 tablet (5 mg total) by mouth daily. Need appointment for refills    Dispense:  30 tablet    Refill:  0    Please make PCP appt for more refills.   hydrOXYzine (ATARAX) 50 MG tablet    Sig: Take 1 tablet (50 mg total) by mouth every 8 (eight) hours as needed for anxiety or itching. Must keep appt in June for additional refills.    Dispense:  90 tablet    Refill:  0   PARoxetine (PAXIL) 40 MG tablet    Sig: Take 1 tablet (40 mg total) by mouth daily. Must keep appt in June for additional refills.    Dispense:  90 tablet    Refill:  2   Vitamin D, Ergocalciferol, (DRISDOL) 1.25 MG (50000 UNIT) CAPS capsule    Sig: Take 1 capsule (50,000 Units total) by mouth every 7 (seven) days.    Dispense:  16 capsule    Refill:  2       Follow Up Instructions:  Patient knows and exam will occur in the next month she will also be contacted by licensed clinical social work and furl to [facility] made I discussed the assessment and treatment plan with the patient. The patient was provided an opportunity to ask questions and all were answered. The patient agreed with the plan and demonstrated an understanding of the instructions.   The patient was advised to call back or seek an in-person evaluation if the symptoms worsen or if the condition fails to improve as  anticipated.  I provided 25 minutes of non-face-to-face time during this encounter  including  median intraservice time , review of notes, labs, imaging, medications  and explaining diagnosis and management to the patient .    Shan Levans, MD

## 2022-06-15 ENCOUNTER — Encounter: Payer: Self-pay | Admitting: Critical Care Medicine

## 2022-06-15 ENCOUNTER — Other Ambulatory Visit: Payer: Self-pay | Admitting: Critical Care Medicine

## 2022-06-15 ENCOUNTER — Ambulatory Visit: Payer: Commercial Managed Care - HMO | Attending: Critical Care Medicine | Admitting: Critical Care Medicine

## 2022-06-15 VITALS — BP 124/85 | HR 106 | Temp 98.4°F | Ht 60.0 in | Wt 221.0 lb

## 2022-06-15 DIAGNOSIS — E559 Vitamin D deficiency, unspecified: Secondary | ICD-10-CM

## 2022-06-15 DIAGNOSIS — F411 Generalized anxiety disorder: Secondary | ICD-10-CM

## 2022-06-15 DIAGNOSIS — I10 Essential (primary) hypertension: Secondary | ICD-10-CM | POA: Diagnosis not present

## 2022-06-15 DIAGNOSIS — F331 Major depressive disorder, recurrent, moderate: Secondary | ICD-10-CM

## 2022-06-15 DIAGNOSIS — L309 Dermatitis, unspecified: Secondary | ICD-10-CM | POA: Diagnosis not present

## 2022-06-15 DIAGNOSIS — L2084 Intrinsic (allergic) eczema: Secondary | ICD-10-CM

## 2022-06-15 MED ORDER — VITAMIN D (ERGOCALCIFEROL) 1.25 MG (50000 UNIT) PO CAPS: 50000.0000 [IU] | ORAL_CAPSULE | ORAL | 2 refills | Status: DC

## 2022-06-15 MED ORDER — AMLODIPINE BESYLATE 5 MG PO TABS: 5.0000 mg | ORAL_TABLET | Freq: Every day | ORAL | 0 refills | Status: DC

## 2022-06-15 MED ORDER — PAROXETINE HCL 40 MG PO TABS: 40.0000 mg | ORAL_TABLET | Freq: Every day | ORAL | 2 refills | Status: DC

## 2022-06-15 MED ORDER — HYDROXYZINE HCL 50 MG PO TABS
50.0000 mg | ORAL_TABLET | Freq: Three times a day (TID) | ORAL | 0 refills | Status: DC | PRN
Start: 2022-06-15 — End: 2022-06-29

## 2022-06-15 NOTE — Telephone Encounter (Signed)
Can we initiate a PA for this or is this a mistake?

## 2022-06-15 NOTE — Assessment & Plan Note (Addendum)
Improved on dupixent

## 2022-06-15 NOTE — Assessment & Plan Note (Signed)
Asthma much improved management per allergy

## 2022-06-15 NOTE — Assessment & Plan Note (Signed)
Continue amlodipine is at goal

## 2022-06-15 NOTE — Patient Instructions (Addendum)
Labs: vitamin D level  All medications refilled  Return Dr Delford Field 6 months

## 2022-06-15 NOTE — Assessment & Plan Note (Signed)
Improved on Paxil

## 2022-06-16 ENCOUNTER — Ambulatory Visit: Payer: Commercial Managed Care - HMO | Admitting: Internal Medicine

## 2022-06-16 ENCOUNTER — Other Ambulatory Visit: Payer: Self-pay

## 2022-06-16 ENCOUNTER — Telehealth: Payer: Self-pay

## 2022-06-16 LAB — VITAMIN D 25 HYDROXY (VIT D DEFICIENCY, FRACTURES): Vit D, 25-Hydroxy: 29.2 ng/mL — ABNORMAL LOW (ref 30.0–100.0)

## 2022-06-16 NOTE — Progress Notes (Signed)
Let patient know her vitamin D is still low she needs to get the vitamin D weekly capsule I prescribed yesterday filled and take it once a week

## 2022-06-16 NOTE — Telephone Encounter (Signed)
Thank you friend!

## 2022-06-16 NOTE — Telephone Encounter (Signed)
Pt was called and is aware of results, DOB was confirmed.  ?

## 2022-06-16 NOTE — Telephone Encounter (Signed)
-----   Message from Storm Frisk, MD sent at 06/16/2022  6:20 AM EDT ----- Let patient know her vitamin D is still low she needs to get the vitamin D weekly capsule I prescribed yesterday filled and take it once a week

## 2022-06-29 ENCOUNTER — Other Ambulatory Visit: Payer: Self-pay | Admitting: Critical Care Medicine

## 2022-06-29 DIAGNOSIS — L309 Dermatitis, unspecified: Secondary | ICD-10-CM

## 2022-07-16 ENCOUNTER — Other Ambulatory Visit: Payer: Self-pay | Admitting: Critical Care Medicine

## 2022-07-16 DIAGNOSIS — I10 Essential (primary) hypertension: Secondary | ICD-10-CM

## 2022-11-11 ENCOUNTER — Other Ambulatory Visit: Payer: Self-pay | Admitting: Critical Care Medicine

## 2022-11-17 ENCOUNTER — Other Ambulatory Visit: Payer: Self-pay

## 2022-11-17 ENCOUNTER — Ambulatory Visit: Payer: Managed Care, Other (non HMO) | Admitting: Internal Medicine

## 2022-11-17 ENCOUNTER — Encounter: Payer: Self-pay | Admitting: Internal Medicine

## 2022-11-17 VITALS — BP 120/80 | HR 99 | Temp 98.3°F | Resp 18 | Ht 60.75 in | Wt 218.6 lb

## 2022-11-17 DIAGNOSIS — Z720 Tobacco use: Secondary | ICD-10-CM

## 2022-11-17 DIAGNOSIS — J3089 Other allergic rhinitis: Secondary | ICD-10-CM

## 2022-11-17 DIAGNOSIS — J4540 Moderate persistent asthma, uncomplicated: Secondary | ICD-10-CM

## 2022-11-17 DIAGNOSIS — L2084 Intrinsic (allergic) eczema: Secondary | ICD-10-CM | POA: Diagnosis not present

## 2022-11-17 DIAGNOSIS — H1045 Other chronic allergic conjunctivitis: Secondary | ICD-10-CM

## 2022-11-17 MED ORDER — DUPILUMAB 300 MG/2ML ~~LOC~~ SOSY
300.0000 mg | PREFILLED_SYRINGE | Freq: Once | SUBCUTANEOUS | Status: AC
  Administered 2022-11-17: 300 mg via SUBCUTANEOUS

## 2022-11-17 NOTE — Patient Instructions (Addendum)
Atopic Dermatitis: set back with recent lapse in dupixent  Daily Care For Maintenance (daily and continue even once eczema controlled) - Recommend hypoallergenic hydrating ointment at least twice daily.  This must be done daily for control of flares. (Great options include Vaseline, CeraVe, Aquaphor, Aveeno, Cetaphil, VaniCream, etc) - Recommend avoiding detergents, soaps or lotions with fragrances/dyes, and instead using products which are hypoallergenic, use second rinse cycle when washing clothes -Wear lose breathable clothing, avoid wool -Avoid extremes of humidity - Limit showers/baths to 5 minutes and use luke warm water instead of hot, pat dry following baths, and apply moisturizer - can use steroid creams as detailed below up to twice weekly for prevention of flares. -Restart Dupixent 300mg  every 2 weeks, catch-up dose given today in clinic -For any additional issues with Dupixent approval, shipment or insurance issues please contact Tammy VonCannon at her [place] office [phone]   For Flares:(add this to maintenance therapy if needed for flares) - Clobetasol 0.5% to body for severe flares-apply topically twice daily to red, raised, thickened areas of skin, followed by moisturizer  - Triamcinolone 0.1% to body for moderate flares-apply topically twice daily to red, raised areas of skin, followed by moisturizer - tacrolimus ointment twice a day (can be used for face and body) - Hydroxyzine 25mg  as needed for itching  Chronic  Rhinitis allergic  improved  - continue allergy avoidance to pollens, cat, dog, dust mite, roach   - Continue Flonase 1 spray per nostril twice daily as needed.  Aim upward and outward - Continue cetirizine 10 mg daily as needed - Information on allergy injections given today   Allergic Conjunctivitis:  - Continue Allergy Eye drops: great options include Pataday (Olopatadine) or Zaditor (ketotifen) for eye symptoms daily as needed-both sold over the  counter if not covered by insurance.   -Avoid eye drops that say red eye relief as they may contain medications that dry out your eyes.  Moderate Persistent  Asthma: controlled  - Breathing test today showed: looked great!  PLAN:  - Spacer sample and demonstration provided. - Daily controller medication(s):  none needed  - Prior to physical activity: albuterol 2 puffs 10-15 minutes before physical activity. - Rescue medications: albuterol 4 puffs every 4-6 hours as needed - Changes during respiratory infections or worsening symptoms: Increase Symbicort  to 4 puffs twice daily for TWO WEEKS. - Asthma control goals:  * Full participation in all desired activities (may need albuterol before activity) * Albuterol use two time or less a week on average (not counting use with activity) * Cough interfering with sleep two time or less a month * Oral steroids no more than once a year * No hospitalizations   GERD  -Continue dietary and lifestyle modifications  Follow up: 6 months   Thank you so much for letting me partake in your care today.  Don't hesitate to reach out if you have any additional concerns!  Ferol Luz, MD  [facility] [place] [place]

## 2022-11-17 NOTE — Progress Notes (Signed)
Immunotherapy   Patient Details  Name: [name] [name] MRN: [id number] Date of Birth: 09-19-1985  11/17/2022  [name] [name]   Dupixent Frequency: Every 2 Weeks Epi-Pen: Not Required  Consent signed and patient instructions given. Patient had some insurance issues and has been without her Dupxient. She received a sample today in office until things get situated and she will resume self admin at home again. Patient received 2mL in the [facility] and did not have to wait in office per our Biologic Protocol.   [name] [name] 11/17/2022, 12:20 PM

## 2022-11-17 NOTE — Progress Notes (Signed)
Follow Up Note  RE: [name] [name] MRN: [id number] DOB: 1985/09/30 Date of Office Visit: 11/17/2022  Referring provider: Storm Frisk, MD Primary care provider: Storm Frisk, MD  Chief Complaint: Medication Management  History of Present Illness: I had the pleasure of seeing [name] [name] for a follow up visit at the [facility] on 11/17/2022. She is a 37 y.o. female, who is being followed for persistent asthma, allergic rhinitis, atopic dermatitis. Her previous allergy office visit was on 02/10/2022 with Dr. Marlynn Perking. Today is a regular follow up visit.  History obtained from patient, chart review.  Last visit eczema noticed improved and she is continued on Dupixent, clobetasol, triamcinolone, tacrolimus, Flonase, Singulair, Zyrtec.  Asthma is also well-controlled she is only needing albuterol 2 times per week.  Unsure when she stopped her symbicort and montelukast.    Reported initial approach to Dupixent tacrolimus twice a day and triamcinolone as needed.  However due to insurance issues there is a delay ultimately this left her with no products for the past 3 months.  She had a significant flare in July after stopping Dupixent, which subsequently improved and then now started to worsen on her face.  She is continues tacrolimus twice daily current facial flares.  She questions whether she needs a loading dose again given duration  Rhinitis is well controlled  cetrizine daily.  She has not had any breakthrough nasal symptoms.  She does have Flonase which she can add on at home but she has not needed. she has not had any ocular symptoms and has not required allergy eye drops.    GERD- contolling with diet.  Rare breakthrough symptoms     Pertinent History/Diagnostics:  - Asthma: Mild persistent  - restrictive pattern spirometry (01/13/2022): ratio 77%, 1.92L, 71% FEV1 (pre), 2.12L, 78% FEV1 (post)  -  Total IgE (01/13/22) 12,536  - Allergic  Rhinitis:   - sIgE  environmental panel (01/13/22): Trees, grass, weeds, molds, cat, dog, dust mite, mouse, roach - Atopic Dermatitis   - severe, dupixent started 01/13/22  - secondary skin infection treated with bactrim (01/13/22)  - Will need patch testing in future     Assessment and Plan: [name] [name] is a 37 y.o. female with: Intrinsic atopic dermatitis  Moderate persistent asthma without complication - Plan: Spirometry with Graph  Other allergic rhinitis  Other chronic allergic conjunctivitis of both eyes  Vapes nicotine containing substance   Plan: Patient Instructions  Atopic Dermatitis: set back with recent lapse in dupixent  Daily Care For Maintenance (daily and continue even once eczema controlled) - Recommend hypoallergenic hydrating ointment at least twice daily.  This must be done daily for control of flares. (Great options include Vaseline, CeraVe, Aquaphor, Aveeno, Cetaphil, VaniCream, etc) - Recommend avoiding detergents, soaps or lotions with fragrances/dyes, and instead using products which are hypoallergenic, use second rinse cycle when washing clothes -Wear lose breathable clothing, avoid wool -Avoid extremes of humidity - Limit showers/baths to 5 minutes and use luke warm water instead of hot, pat dry following baths, and apply moisturizer - can use steroid creams as detailed below up to twice weekly for prevention of flares. -Restart Dupixent 300mg  every 2 weeks, catch-up dose given today in clinic -For any additional issues with Dupixent approval, shipment or insurance issues please contact Tammy VonCannon at her [place] office [phone]   For Flares:(add this to maintenance therapy if needed for flares) - Clobetasol 0.5% to body for severe flares-apply topically twice daily  to red, raised, thickened areas of skin, followed by moisturizer  - Triamcinolone 0.1% to body for moderate flares-apply topically twice daily to red, raised areas of skin, followed by  moisturizer - tacrolimus ointment twice a day (can be used for face and body) - Hydroxyzine 25mg  as needed for itching  Chronic  Rhinitis allergic  improved  - continue allergy avoidance to pollens, cat, dog, dust mite, roach   - Continue Flonase 1 spray per nostril twice daily as needed.  Aim upward and outward - Continue cetirizine 10 mg daily as needed - Information on allergy injections given today   Allergic Conjunctivitis:  - Continue Allergy Eye drops: great options include Pataday (Olopatadine) or Zaditor (ketotifen) for eye symptoms daily as needed-both sold over the counter if not covered by insurance.   -Avoid eye drops that say red eye relief as they may contain medications that dry out your eyes.  Moderate Persistent  Asthma: controlled  - Breathing test today showed: looked great!  PLAN:  - Spacer sample and demonstration provided. - Daily controller medication(s):  none needed  - Prior to physical activity: albuterol 2 puffs 10-15 minutes before physical activity. - Rescue medications: albuterol 4 puffs every 4-6 hours as needed - Changes during respiratory infections or worsening symptoms: Increase Symbicort  to 4 puffs twice daily for TWO WEEKS. - Asthma control goals:  * Full participation in all desired activities (may need albuterol before activity) * Albuterol use two time or less a week on average (not counting use with activity) * Cough interfering with sleep two time or less a month * Oral steroids no more than once a year * No hospitalizations   GERD  -Continue dietary and lifestyle modifications  Follow up: 6 months   Thank you so much for letting me partake in your care today.  Don't hesitate to reach out if you have any additional concerns!  Ferol Luz, MD  [facility] [place] [place]   Meds ordered this encounter  Medications   dupilumab (DUPIXENT) prefilled syringe 300 mg    Lab Orders  No laboratory test(s) ordered  today   Diagnostics: ACT 21/25   Medication List:  Current Outpatient Medications  Medication Sig Dispense Refill   albuterol (VENTOLIN HFA) 108 (90 Base) MCG/ACT inhaler Inhale 2 puffs into the lungs every 6 (six) hours as needed for wheezing or shortness of breath. 18 g 0   amLODipine (NORVASC) 5 MG tablet Take 1 tablet (5 mg total) by mouth daily. 90 tablet 1   clobetasol ointment (TEMOVATE) 0.05 % Apply topically twice daily to BODY as needed for SEVERE red, sandpaper and thickened like rash.  Do not use on face, groin or armpits.  Use for up to two weeks at a time. 60 g 3   DUPIXENT 300 MG/2ML prefilled syringe Inject into the skin.     hydrOXYzine (ATARAX) 50 MG tablet Take 1 tablet (50 mg total) by mouth every 8 (eight) hours as needed. 270 tablet 1   PARoxetine (PAXIL) 40 MG tablet Take 1 tablet (40 mg total) by mouth daily. Must keep appt in June for additional refills. 90 tablet 2   Vitamin D, Ergocalciferol, (DRISDOL) 1.25 MG (50000 UNIT) CAPS capsule Take 1 capsule (50,000 Units total) by mouth every 7 (seven) days. 16 capsule 2   No current facility-administered medications for this visit.   Allergies: Allergies  Allergen Reactions   Doxycycline Photosensitivity   I reviewed her past medical history, social  history, family history, and environmental history and no significant changes have been reported from her previous visit.  ROS: All others negative except as noted per HPI.   Objective: BP 120/80 (BP Location: Right Arm, Patient Position: Sitting, Cuff Size: Normal)   Pulse 99   Temp 98.3 F (36.8 C) (Temporal)   Resp 18   Ht 5' 0.75" (1.543 m)   Wt 218 lb 9.6 oz (99.2 kg)   SpO2 96%   BMI 41.64 kg/m  Body mass index is 41.64 kg/m. General Appearance:  Alert, cooperative, no distress, appears stated age  Head:  Normocephalic, without obvious abnormality, atraumatic  Eyes:  Conjunctiva clear, EOM's intact  Nose: Nares normal,   Throat: Lips, tongue normal;  teeth and gums normal,   Neck: Supple, symmetrical  Lungs:   clear to auscultation bilaterally, Respirations unlabored, no coughing  Heart:  regular rate and rhythm and no murmur, Appears well perfused  Extremities: No edema  Skin: Skin color, texture, turgor normal,  eczematous patches on eyelids and eyebrows  Neurologic: No gross deficits   Previous notes and tests were reviewed. The plan was reviewed with the patient/family, and all questions/concerned were addressed.  It was my pleasure to see [name] [name] today and participate in her care. Please feel free to contact me with any questions or concerns.  Sincerely,  Ferol Luz, MD  Allergy & Immunology  [facility] Office: [phone]

## 2022-12-20 ENCOUNTER — Other Ambulatory Visit: Payer: Self-pay | Admitting: Critical Care Medicine

## 2023-01-08 ENCOUNTER — Other Ambulatory Visit: Payer: Self-pay | Admitting: Internal Medicine

## 2023-01-09 ENCOUNTER — Ambulatory Visit: Payer: Commercial Managed Care - HMO | Attending: Critical Care Medicine | Admitting: Nurse Practitioner

## 2023-01-09 ENCOUNTER — Ambulatory Visit: Payer: Commercial Managed Care - HMO | Admitting: Critical Care Medicine

## 2023-01-09 ENCOUNTER — Encounter: Payer: Self-pay | Admitting: Nurse Practitioner

## 2023-01-09 VITALS — BP 136/80 | HR 100 | Ht 60.75 in | Wt 218.6 lb

## 2023-01-09 DIAGNOSIS — M79672 Pain in left foot: Secondary | ICD-10-CM

## 2023-01-09 DIAGNOSIS — J069 Acute upper respiratory infection, unspecified: Secondary | ICD-10-CM | POA: Diagnosis not present

## 2023-01-09 DIAGNOSIS — I10 Essential (primary) hypertension: Secondary | ICD-10-CM

## 2023-01-09 MED ORDER — AMLODIPINE BESYLATE 5 MG PO TABS: 5.0000 mg | ORAL_TABLET | Freq: Every day | ORAL | 1 refills | Status: DC

## 2023-01-09 MED ORDER — MELOXICAM 7.5 MG PO TABS: 7.5000 mg | ORAL_TABLET | Freq: Every day | ORAL | 3 refills | Status: DC

## 2023-01-09 MED ORDER — PROMETHAZINE-DM 6.25-15 MG/5ML PO SYRP: 5.0000 mL | ORAL_SOLUTION | Freq: Four times a day (QID) | ORAL | 0 refills | Status: DC | PRN

## 2023-01-09 NOTE — Patient Instructions (Signed)
[facility] at [facility] Address: [address] [place], [place], [place] [address] Phone: [phone]

## 2023-01-09 NOTE — Progress Notes (Signed)
 Assessment & Plan:  Jacalyn was seen today for medical management of chronic issues and achilles tendinitis.  Diagnoses and all orders for this visit:  Primary hypertension BP well controlled -     CMP14+EGFR -     amLODipine  (NORVASC ) 5 MG tablet; Take 1 tablet (5 mg total) by mouth daily.  Left foot pain RICE -     Ambulatory referral to Podiatry -     meloxicam  (MOBIC ) 7.5 MG tablet; Take 1 tablet (7.5 mg total) by mouth daily.  URI with cough and congestion -     promethazine -dextromethorphan  (PROMETHAZINE -DM) 6.25-15 MG/5ML syrup; Take 5 mLs by mouth 4 (four) times daily as needed for cough.    Patient has been counseled on age-appropriate routine health concerns for screening and prevention. These are reviewed and up-to-date. Referrals have been placed accordingly. Immunizations are up-to-date or declined.    Subjective:   Chief Complaint  Patient presents with   Medical Management of Chronic Issues   Achilles tendinitis    Left foot pain.    [name] [name] 38 y.o. female presents to office today with complaints of left foot pain and for follow up to HTN.  Ankle pain Onset of the symptoms was several months ago. Inciting event: none known. Current symptoms include  achilles pain .  Aggravating symptoms: standing and walking. Patient's overall course: symptoms have progressed to a point and plateaued. Patient has had no prior ankle problems. Previous visits for this problem: none.  Evaluation to date: none.  Treatment to date: OTC analgesics which are not very effective.    Blood pressure is well controlled with amlodipine  5 mg daily.  BP Readings from Last 3 Encounters:  01/09/23 136/80  11/17/22 120/80  06/15/22 124/85    Upper Respiratory Infection: Patient complains of symptoms of a URI. Symptoms include congestion and productive cough . Onset of symptoms was several days ago, gradually improving since that time.      Review of Systems   Constitutional:  Negative for fever, malaise/fatigue and weight loss.  HENT: Negative.  Negative for nosebleeds.   Eyes: Negative.  Negative for blurred vision, double vision and photophobia.  Respiratory:  Positive for cough. Negative for shortness of breath.   Cardiovascular: Negative.  Negative for chest pain, palpitations and leg swelling.  Gastrointestinal: Negative.  Negative for heartburn, nausea and vomiting.  Musculoskeletal:  Positive for joint pain. Negative for myalgias.  Neurological: Negative.  Negative for dizziness, focal weakness, seizures and headaches.  Psychiatric/Behavioral: Negative.  Negative for suicidal ideas.     Past Medical History:  Diagnosis Date   Admission for sterilization 06/12/2013   Asthma    Breast abscess    Depression    HX of - not on med n, doing well.   Eczema    Family history of anesthesia complication    Grandmother has difficulty waskin up.   Hypertension    Pneumonia    HX only -years ago has had a few times.   Pregnant state, incidental    S/P tubal ligation 06/13/2013   SVD (spontaneous vaginal delivery)    x 2    Past Surgical History:  Procedure Laterality Date   INCISE AND DRAIN ABCESS     left breast x 2   INCISION AND DRAINAGE ABSCESS Left 02/17/2012   Procedure:  DRAINAGEof recurrent breast abscess;  Surgeon: Vicenta DELENA Poli, MD;  Location: [facility];  Service: General;  Laterality: Left;   LAPAROSCOPIC TUBAL LIGATION Bilateral 06/13/2013  Procedure: LAPAROSCOPIC TUBAL LIGATION;  Surgeon: Ezzie Buba, MD;  Location: [facility];  Service: Gynecology;  Laterality: Bilateral;   TUBAL LIGATION      Family History  Problem Relation Age of Onset   Healthy Mother    Anxiety disorder Mother    Healthy Father    Stroke Maternal Grandmother    Stroke Paternal Grandfather    Bipolar disorder Neg Hx    Depression Neg Hx     Social History Reviewed with no changes to be made today.   Outpatient Medications Prior to  Visit  Medication Sig Dispense Refill   albuterol  (VENTOLIN  HFA) 108 (90 Base) MCG/ACT inhaler TAKE 2 PUFFS BY MOUTH EVERY 6 HOURS AS NEEDED FOR WHEEZE OR SHORTNESS OF BREATH 8.5 each 0   clobetasol  ointment (TEMOVATE ) 0.05 % Apply topically twice daily to BODY as needed for SEVERE red, sandpaper and thickened like rash.  Do not use on face, groin or armpits.  Use for up to two weeks at a time. 60 g 3   DUPIXENT  300 MG/2ML prefilled syringe INJECT 300 MG UNDER THE SKIN EVERY 2 WEEKS 4 mL 11   hydrOXYzine  (ATARAX ) 50 MG tablet Take 1 tablet (50 mg total) by mouth every 8 (eight) hours as needed. 270 tablet 1   PARoxetine  (PAXIL ) 40 MG tablet Take 1 tablet (40 mg total) by mouth daily. Must keep appt in June for additional refills. 90 tablet 2   Vitamin D , Ergocalciferol , (DRISDOL ) 1.25 MG (50000 UNIT) CAPS capsule Take 1 capsule (50,000 Units total) by mouth every 7 (seven) days. 16 capsule 2   amLODipine  (NORVASC ) 5 MG tablet Take 1 tablet (5 mg total) by mouth daily. 90 tablet 1   No facility-administered medications prior to visit.    Allergies  Allergen Reactions   Doxycycline  Photosensitivity       Objective:    BP 136/80 (BP Location: Left Arm, Patient Position: Sitting, Cuff Size: Normal)   Pulse 100   Ht 5' 0.75 (1.543 m)   Wt 218 lb 9.6 oz (99.2 kg)   LMP 01/02/2023 (Approximate)   BMI 41.64 kg/m  Wt Readings from Last 3 Encounters:  01/09/23 218 lb 9.6 oz (99.2 kg)  11/17/22 218 lb 9.6 oz (99.2 kg)  06/15/22 221 lb (100.2 kg)    Physical Exam Vitals and nursing note reviewed.  Constitutional:      Appearance: She is well-developed.  HENT:     Head: Normocephalic and atraumatic.  Cardiovascular:     Rate and Rhythm: Normal rate and regular rhythm.     Heart sounds: Normal heart sounds. No murmur heard.    No friction rub. No gallop.  Pulmonary:     Effort: Pulmonary effort is normal. No tachypnea or respiratory distress.     Breath sounds: Normal breath sounds.  No decreased breath sounds, wheezing, rhonchi or rales.  Chest:     Chest wall: No tenderness.  Abdominal:     General: Bowel sounds are normal.     Palpations: Abdomen is soft.  Musculoskeletal:        General: Normal range of motion.     Cervical back: Normal range of motion.     Left foot: Tenderness present.       Feet:  Feet:     Left foot:     Skin integrity: No ulcer or blister.  Skin:    General: Skin is warm and dry.  Neurological:     Mental Status: She is alert  and oriented to person, place, and time.     Coordination: Coordination normal.  Psychiatric:        Behavior: Behavior normal. Behavior is cooperative.        Thought Content: Thought content normal.        Judgment: Judgment normal.          Patient has been counseled extensively about nutrition and exercise as well as the importance of adherence with medications and regular follow-up. The patient was given clear instructions to go to ER or return to medical center if symptoms don't improve, worsen or new problems develop. The patient verbalized understanding.   Follow-up: Return if symptoms worsen or fail to improve.   Haze LELON Servant, FNP-BC [facility] [place], [place] [phone]   01/09/2023, 5:02 PM

## 2023-01-10 ENCOUNTER — Telehealth: Payer: Self-pay

## 2023-01-10 LAB — CMP14+EGFR
ALT: 80 [IU]/L — ABNORMAL HIGH (ref 0–32)
AST: 68 [IU]/L — ABNORMAL HIGH (ref 0–40)
Albumin: 4.3 g/dL (ref 3.9–4.9)
Alkaline Phosphatase: 91 [IU]/L (ref 44–121)
BUN/Creatinine Ratio: 8 — ABNORMAL LOW (ref 9–23)
BUN: 6 mg/dL (ref 6–20)
Bilirubin Total: <0.2 mg/dL (ref 0.0–1.2)
CO2: 25 mmol/L (ref 20–29)
Calcium: 9.6 mg/dL (ref 8.7–10.2)
Chloride: 99 mmol/L (ref 96–106)
Creatinine, Ser: 0.79 mg/dL (ref 0.57–1.00)
Globulin, Total: 2.1 g/dL (ref 1.5–4.5)
Glucose: 111 mg/dL — ABNORMAL HIGH (ref 70–99)
Potassium: 4 mmol/L (ref 3.5–5.2)
Sodium: 141 mmol/L (ref 134–144)
Total Protein: 6.4 g/dL (ref 6.0–8.5)
eGFR: 99 mL/min/{1.73_m2} (ref >59)

## 2023-01-10 NOTE — Telephone Encounter (Signed)
 Copied from CRM [id number]. Topic: General - Other >> Jan 10, 2023 10:41 AM [name] [name] wrote: Reason for CRM: The patient would ike to be contacted by a member of staff when possible to review their recent labs from 01/09/23  Please contact the patient further when available

## 2023-01-10 NOTE — Telephone Encounter (Signed)
 Response to patient sent through mychart.

## 2023-01-11 ENCOUNTER — Telehealth: Payer: Self-pay | Admitting: Nurse Practitioner

## 2023-01-11 NOTE — Telephone Encounter (Signed)
 Pt is calling in requesting her lab results. Please follow up with pt.

## 2023-01-11 NOTE — Telephone Encounter (Signed)
 Awaiting Provider interpretation of labs. Patient made aware of this on 01/10/2023 via my chart message.

## 2023-01-12 NOTE — Telephone Encounter (Signed)
 LAB NOTES SENT

## 2023-01-17 ENCOUNTER — Ambulatory Visit (INDEPENDENT_AMBULATORY_CARE_PROVIDER_SITE_OTHER): Payer: Commercial Managed Care - HMO | Admitting: Podiatry

## 2023-01-17 ENCOUNTER — Ambulatory Visit (INDEPENDENT_AMBULATORY_CARE_PROVIDER_SITE_OTHER): Payer: Commercial Managed Care - HMO

## 2023-01-17 DIAGNOSIS — M7662 Achilles tendinitis, left leg: Secondary | ICD-10-CM

## 2023-01-17 DIAGNOSIS — M65272 Calcific tendinitis, left ankle and foot: Secondary | ICD-10-CM

## 2023-01-17 MED ORDER — TRIAMCINOLONE ACETONIDE 10 MG/ML IJ SUSP
10.0000 mg | Freq: Once | INTRAMUSCULAR | Status: AC
  Administered 2023-01-17: 10 mg via INTRA_ARTICULAR

## 2023-01-17 MED ORDER — TRIAMCINOLONE ACETONIDE 10 MG/ML IJ SUSP: 10.0000 mg | Freq: Once | INTRAMUSCULAR | Status: DC

## 2023-01-17 NOTE — Progress Notes (Signed)
 Subjective:   Patient ID: [name] [name], female   DOB: 38 y.o.   MRN: [id number]   HPI Patient presents stating she is getting a lot of pain in the back of her left heel and has been present now for about 3 months.  States she does not remember injury she does do housework is on her feet a lot does not currently smoke moderate obesity likes to be active   Review of Systems  All other systems reviewed and are negative.       Objective:  Physical Exam Vitals and nursing note reviewed.  Constitutional:      Appearance: She is well-developed.  Pulmonary:     Effort: Pulmonary effort is normal.  Musculoskeletal:        General: Normal range of motion.  Skin:    General: Skin is warm.  Neurological:     Mental Status: She is alert.     Neurovascular status intact muscle strength found to be adequate range of motion adequate with the patient noted to have inflammation and pain of the posterior aspect of the left heel with fluid buildup around the area.  It is mostly lateral with the medial and central under good control no equinus noted and good digital perfusion     Assessment:  Acute Achilles tendinitis left lateral side painful when pressed     Plan:  H&P reviewed condition went ahead today did sterile prep after explaining the chances for rupture and other pathology and carefully injected this lateral side 3 mg dexamethasone  Kenalog  5 mg Xylocaine  and then applied heel lift and gave her instructions to purchase an air fracture walker that will be much less expensive on [employer].  Reappoint as symptoms indicate may require more aggressive approach  X-rays indicate no signs of spur or other bony pathology associated with condition

## 2023-01-17 NOTE — Patient Instructions (Signed)

## 2023-02-05 ENCOUNTER — Other Ambulatory Visit: Payer: Self-pay | Admitting: Internal Medicine

## 2023-02-09 ENCOUNTER — Encounter: Payer: Self-pay | Admitting: Internal Medicine

## 2023-02-09 ENCOUNTER — Ambulatory Visit (INDEPENDENT_AMBULATORY_CARE_PROVIDER_SITE_OTHER): Payer: Commercial Managed Care - HMO | Admitting: Internal Medicine

## 2023-02-09 ENCOUNTER — Other Ambulatory Visit: Payer: Self-pay

## 2023-02-09 VITALS — BP 118/82 | HR 61 | Temp 98.2°F | Resp 18

## 2023-02-09 DIAGNOSIS — L2084 Intrinsic (allergic) eczema: Secondary | ICD-10-CM

## 2023-02-09 DIAGNOSIS — Z720 Tobacco use: Secondary | ICD-10-CM

## 2023-02-09 DIAGNOSIS — J3089 Other allergic rhinitis: Secondary | ICD-10-CM | POA: Diagnosis not present

## 2023-02-09 DIAGNOSIS — L309 Dermatitis, unspecified: Secondary | ICD-10-CM

## 2023-02-09 DIAGNOSIS — H1045 Other chronic allergic conjunctivitis: Secondary | ICD-10-CM | POA: Diagnosis not present

## 2023-02-09 DIAGNOSIS — J4530 Mild persistent asthma, uncomplicated: Secondary | ICD-10-CM | POA: Diagnosis not present

## 2023-02-09 MED ORDER — TACROLIMUS 0.1 % EX OINT: TOPICAL_OINTMENT | CUTANEOUS | 5 refills | Status: DC

## 2023-02-09 MED ORDER — FLUTICASONE PROPIONATE 50 MCG/ACT NA SUSP: 1.0000 | Freq: Two times a day (BID) | NASAL | 5 refills | Status: DC | PRN

## 2023-02-09 MED ORDER — AIRSUPRA 90-80 MCG/ACT IN AERO: 2.0000 | INHALATION_SPRAY | RESPIRATORY_TRACT | 1 refills | Status: DC | PRN

## 2023-02-09 MED ORDER — CETIRIZINE HCL 10 MG PO TABS: 10.0000 mg | ORAL_TABLET | Freq: Every day | ORAL | 5 refills | Status: DC | PRN

## 2023-02-09 MED ORDER — HYDROXYZINE HCL 50 MG PO TABS: 50.0000 mg | ORAL_TABLET | Freq: Three times a day (TID) | ORAL | 1 refills | Status: DC | PRN

## 2023-02-09 MED ORDER — CLOBETASOL PROPIONATE 0.05 % EX OINT: TOPICAL_OINTMENT | CUTANEOUS | 3 refills | Status: AC

## 2023-02-09 MED ORDER — TRIAMCINOLONE ACETONIDE 0.1 % EX OINT: TOPICAL_OINTMENT | CUTANEOUS | 5 refills | Status: DC

## 2023-02-09 NOTE — Patient Instructions (Signed)
 Atopic Dermatitis: mild flare  Daily Care For Maintenance (daily and continue even once eczema controlled) - Recommend hypoallergenic hydrating ointment at least twice daily.  This must be done daily for control of flares. (Great options include Vaseline, CeraVe, Aquaphor, Aveeno, Cetaphil, VaniCream, etc) - Recommend avoiding detergents, soaps or lotions with fragrances/dyes, and instead using products which are hypoallergenic, use second rinse cycle when washing clothes -Wear lose breathable clothing, avoid wool -Avoid extremes of humidity - Limit showers/baths to 5 minutes and use luke warm water instead of hot, pat dry following baths, and apply moisturizer - can use steroid creams as detailed below up to twice weekly for prevention of flares.  Continue Dupixent  injection for now  We will get screening labs for jak inhibitors  Restart topicals for flares as below    For Flares:(add this to maintenance therapy if needed for flares) - Clobetasol  0.5% to body for severe flares-apply topically twice daily to red, raised, thickened areas of skin, followed by moisturizer  - Triamcinolone  0.1% to body for moderate flares-apply topically twice daily to red, raised areas of skin, followed by moisturizer - tacrolimus  ointment twice a day (can be used for face and body) - Hydroxyzine  25mg  as needed for itching  Chronic  Rhinitis allergic  improved  - continue allergy avoidance to pollens, cat, dog, dust mite, roach   - Continue Flonase  1 spray per nostril twice daily as needed.  Aim upward and outward - Continue cetirizine  10 mg daily as needed - Information on allergy injections given today   Allergic Conjunctivitis:  - Continue Allergy Eye drops: great options include Pataday  (Olopatadine ) or Zaditor (ketotifen) for eye symptoms daily as needed-both sold over the counter if not covered by insurance.   -Avoid eye drops that say red eye relief as they may contain medications that dry out your  eyes.  Moderate Persistent  Asthma: moderately well controlled   PLAN:  - Spacer sample and demonstration provided. - Daily controller medication(s):  none needed  - Rescue medications:  Air supra 2 puffs every 4-6 hours as needed for cough, wheeze, dyspnea   -copay card and sample provided   - Asthma control goals:  * Full participation in all desired activities (may need albuterol  before activity) * Albuterol  use two time or less a week on average (not counting use with activity) * Cough interfering with sleep two time or less a month * Oral steroids no more than once a year * No hospitalizations   GERD  -Continue dietary and lifestyle modifications  Follow up: In [facility] at 4pm on February 26th (Wednesday )   Thank you so much for letting me partake in your care today.  Don't hesitate to reach out if you have any additional concerns!  Hargis Springer, MD  [facility] [place], [place]

## 2023-02-09 NOTE — Progress Notes (Signed)
 FOLLOW UP Date of Service/Encounter:  02/13/23  Subjective:  [name] [name] (DOB: Nov 02, 1985) is a 38 y.o. female who [facility] in re-evaluation of the following: atopic dermatitis, asthma, rhinitis  History obtained from: chart review and patient and daughter .  For Review, LV was on 11/17/22  with Dr. Lorin seen for routine follow-up. See below for summary of history and diagnostics.   ----------------------------------------------------- Pertinent History/Diagnostics:  - Asthma: Mild persistent             - restrictive pattern spirometry (01/13/2022): ratio 77%, 1.92L, 71% FEV1 (pre), 2.12L, 78% FEV1 (post)             -  Total IgE (01/13/22) 12,536    - Allergic Rhinitis:              - sIgE  environmental panel (01/13/22): Trees, grass, weeds, molds, cat, dog, dust mite, mouse, roach - Atopic Dermatitis              - severe, dupixent  started 01/13/22             - secondary skin infection treated with bactrim  (01/13/22)             - Will need patch testing in future  Today presents for follow-up. Discussed the use of AI scribe software for clinical note transcription with the patient, who gave verbal consent to proceed.  History of Present Illness   [name] [name] is a 38 year old female with eczema who presents with concerns about the effectiveness of Dupixent .  Dupixent , which she has been using for her eczema, seems to be less effective over time. Initially, it cleared up her symptoms, but her face and neck have recently broken out again. She has not been using any topical treatments recently, which may contribute to the current flare.  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    She is open to starting a jak inhibitor if it allows for better control.   She is currently administering Dupixent  injections at home and recently picked up a sample  due to her prescription being expired. She is due for her next injection on February 12th.   In regards to asthma, it is well controlled.  Using air supra rarely. No OCS/ED/ABS since last visit.   Rhinitis is well controlled with flonase  and cetirizine .  She may be interested in allergy injections in the future.    GERD controlled with diet.         All medications reviewed by clinical staff and updated in chart. No new pertinent medical or surgical history except as noted in HPI.  ROS: All others negative except as noted per HPI.   Objective:  BP 118/82 (BP Location: Right Arm, Patient Position: Sitting, Cuff Size: Normal)   Pulse 61   Temp 98.2 F (36.8 C) (Temporal)   Resp 18   SpO2 96%  There is no height or weight on file to calculate BMI. Physical Exam: General Appearance:  Alert, cooperative, no distress, appears stated age  Head:  Normocephalic, without obvious abnormality, atraumatic  Eyes:  Conjunctiva clear, EOM's intact  Ears EACs normal bilaterally  Nose: Nares normal, hypertrophic turbinates, normal mucosa, no visible anterior polyps, and septum midline  Throat: Lips, tongue normal; teeth and gums normal, normal posterior oropharynx  Neck: Supple, symmetrical  Lungs:   clear to auscultation bilaterally,  Respirations unlabored, no coughing  Heart:  regular rate and rhythm and no murmur, Appears well perfused  Extremities: No edema  Skin: erythematous, dry patches scattered on NECK, HANDS   Neurologic: No gross deficits   Labs:  Lab Orders         QuantiFERON-TB Gold Plus         Viral Hepatitis HBV, HCV         Lipid Panel w/o Chol/HDL Ratio         CBC With Diff/Platelet         Interpretation:      Spirometry:  Tracings reviewed. Her effort: Good reproducible efforts. FVC: 2.05L FEV1: 1.85L, 67% predicted FEV1/FVC ratio: 90% Interpretation: Spirometry consistent with mixed obstructive and restrictive disease.  Please see scanned spirometry results  for details.    Assessment/Plan   Atopic Dermatitis: mild flare  Daily Care For Maintenance (daily and continue even once eczema controlled) - Recommend hypoallergenic hydrating ointment at least twice daily.  This must be done daily for control of flares. (Great options include Vaseline, CeraVe, Aquaphor, Aveeno, Cetaphil, VaniCream, etc) - Recommend avoiding detergents, soaps or lotions with fragrances/dyes, and instead using products which are hypoallergenic, use second rinse cycle when washing clothes -Wear lose breathable clothing, avoid wool -Avoid extremes of humidity - Limit showers/baths to 5 minutes and use luke warm water instead of hot, pat dry following baths, and apply moisturizer - can use steroid creams as detailed below up to twice weekly for prevention of flares.  Continue Dupixent  injection for now  We will get screening labs for jak inhibitors  Restart topicals for flares as below    For Flares:(add this to maintenance therapy if needed for flares) - Clobetasol  0.5% to body for severe flares-apply topically twice daily to red, raised, thickened areas of skin, followed by moisturizer  - Triamcinolone  0.1% to body for moderate flares-apply topically twice daily to red, raised areas of skin, followed by moisturizer - tacrolimus  ointment twice a day (can be used for face and body) - Hydroxyzine  25mg  as needed for itching  Chronic  Rhinitis allergic  improved  - continue allergy avoidance to pollens, cat, dog, dust mite, roach   - Continue Flonase  1 spray per nostril twice daily as needed.  Aim upward and outward - Continue cetirizine  10 mg daily as needed - Information on allergy injections given today   Allergic Conjunctivitis:  - Continue Allergy Eye drops: great options include Pataday  (Olopatadine ) or Zaditor (ketotifen) for eye symptoms daily as needed-both sold over the counter if not covered by insurance.   -Avoid eye drops that say red eye relief as they may  contain medications that dry out your eyes.  Moderate Persistent  Asthma: moderately well controlled   PLAN:  - Spacer sample and demonstration provided. - Daily controller medication(s): none needed  - Rescue medications: Air supra 2 puffs every 4-6 hours as needed for cough, wheeze, dyspnea   -copay card and sample provided   - Asthma control goals:  * Full participation in all desired activities (may need albuterol  before activity) * Albuterol  use two time or less a week on average (not counting use with activity) * Cough interfering with sleep two time or less a month * Oral steroids no more than once a year * No hospitalizations   GERD  -Continue dietary and lifestyle modifications  Follow up: In [facility] [place] on February 26th (Wednesday )   Other:     Thank  you so much for letting me partake in your care today.  Don't hesitate to reach out if you have any additional concerns!  Hargis Springer, MD  [facility] [place], [place]

## 2023-02-16 LAB — QUANTIFERON-TB GOLD PLUS
QuantiFERON Nil Value: 0.22 [IU]/mL
QuantiFERON TB1 Ag Value: 0.2 [IU]/mL
QuantiFERON TB2 Ag Value: 0.31 [IU]/mL

## 2023-02-16 LAB — LIPID PANEL W/O CHOL/HDL RATIO
Cholesterol, Total: 171 mg/dL (ref 100–199)
HDL: 46 mg/dL (ref >39)
LDL Chol Calc (NIH): 81 mg/dL (ref 0–99)
Triglycerides: 265 mg/dL — ABNORMAL HIGH (ref 0–149)
VLDL Cholesterol Cal: 44 mg/dL — ABNORMAL HIGH (ref 5–40)

## 2023-02-16 LAB — CBC WITH DIFF/PLATELET
Basophils Absolute: 0.1 10*3/uL (ref 0.0–0.2)
Basos: 1 %
EOS (ABSOLUTE): 0.5 10*3/uL — ABNORMAL HIGH (ref 0.0–0.4)
Eos: 6 %
Hematocrit: 42.5 % (ref 34.0–46.6)
Hemoglobin: 13.8 g/dL (ref 11.1–15.9)
Immature Grans (Abs): 0 10*3/uL (ref 0.0–0.1)
Immature Granulocytes: 0 %
Lymphocytes Absolute: 2 10*3/uL (ref 0.7–3.1)
Lymphs: 25 %
MCH: 31.7 pg (ref 26.6–33.0)
MCHC: 32.5 g/dL (ref 31.5–35.7)
MCV: 98 fL — ABNORMAL HIGH (ref 79–97)
Monocytes Absolute: 0.4 10*3/uL (ref 0.1–0.9)
Monocytes: 6 %
Neutrophils Absolute: 5 10*3/uL (ref 1.4–7.0)
Neutrophils: 62 %
Platelets: 378 10*3/uL (ref 150–450)
RBC: 4.36 x10E6/uL (ref 3.77–5.28)
RDW: 12.4 % (ref 11.7–15.4)
WBC: 8 10*3/uL (ref 3.4–10.8)

## 2023-02-16 LAB — VIRAL HEPATITIS HBV, HCV
HCV Ab: NONREACTIVE
Hep B Core Total Ab: NEGATIVE
Hep B Surface Ab, Qual: REACTIVE
Hepatitis B Surface Ag: NEGATIVE

## 2023-02-16 LAB — HCV INTERPRETATION

## 2023-02-24 ENCOUNTER — Other Ambulatory Visit: Payer: Self-pay | Admitting: Critical Care Medicine

## 2023-02-26 NOTE — Telephone Encounter (Signed)
 Recently prescribed air supra

## 2023-02-28 ENCOUNTER — Ambulatory Visit: Payer: Commercial Managed Care - HMO | Admitting: Internal Medicine

## 2023-02-28 ENCOUNTER — Other Ambulatory Visit: Payer: Self-pay | Admitting: Critical Care Medicine

## 2023-02-28 DIAGNOSIS — F411 Generalized anxiety disorder: Secondary | ICD-10-CM

## 2023-03-28 ENCOUNTER — Encounter: Payer: Self-pay | Admitting: Allergy

## 2023-03-28 ENCOUNTER — Ambulatory Visit (INDEPENDENT_AMBULATORY_CARE_PROVIDER_SITE_OTHER): Admitting: Allergy

## 2023-03-28 ENCOUNTER — Other Ambulatory Visit: Payer: Self-pay

## 2023-03-28 ENCOUNTER — Ambulatory Visit: Admitting: Internal Medicine

## 2023-03-28 VITALS — BP 138/88 | HR 95 | Temp 98.4°F | Wt 226.9 lb

## 2023-03-28 DIAGNOSIS — R748 Abnormal levels of other serum enzymes: Secondary | ICD-10-CM | POA: Diagnosis not present

## 2023-03-28 DIAGNOSIS — J3089 Other allergic rhinitis: Secondary | ICD-10-CM

## 2023-03-28 DIAGNOSIS — L309 Dermatitis, unspecified: Secondary | ICD-10-CM | POA: Diagnosis not present

## 2023-03-28 DIAGNOSIS — E782 Mixed hyperlipidemia: Secondary | ICD-10-CM

## 2023-03-28 DIAGNOSIS — J4530 Mild persistent asthma, uncomplicated: Secondary | ICD-10-CM

## 2023-03-28 MED ORDER — TRIAMCINOLONE ACETONIDE 0.1 % EX OINT: 1.0000 | TOPICAL_OINTMENT | Freq: Two times a day (BID) | CUTANEOUS | 2 refills | Status: AC | PRN

## 2023-03-28 MED ORDER — HYDROCORTISONE 2.5 % EX OINT: TOPICAL_OINTMENT | Freq: Two times a day (BID) | CUTANEOUS | 2 refills | Status: AC | PRN

## 2023-03-28 MED ORDER — TRIAMCINOLONE ACETONIDE 0.1 % EX CREA: 1.0000 | TOPICAL_CREAM | Freq: Every day | CUTANEOUS | 1 refills | Status: DC

## 2023-03-28 MED ORDER — VTAMA 1 % EX CREA: 1.0000 | TOPICAL_CREAM | Freq: Every day | CUTANEOUS | 3 refills | Status: DC | PRN

## 2023-03-28 NOTE — Progress Notes (Signed)
 Follow Up Note  RE: [name] [name] MRN: [id number] DOB: 30-Sep-1985 Date of Office Visit: 03/28/2023  Referring provider: Storm Frisk, MD Primary care provider: Storm Frisk, MD  Chief Complaint: Eczema (Wants to discuss Rinvoq  due to dupixent not being as affective.)  History of Present Illness: I had the pleasure of seeing [name] [name] for a [facility]  on 03/28/2023. She is a 38 y.o. female, who is being followed for eczema, asthma and allergic rhino conjunctivitis. Her previous allergy office visit was on 02/09/2023 with Dr. Marlynn Perking. Today is a new complaint visit of eczema not controlled .  Discussed the use of AI scribe software for clinical note transcription with the patient, who gave verbal consent to proceed.    She has a history of eczema, previously managed with Dupixent for about a year, which initially provided relief but became less effective over time. Her last Dupixent injection was in February, and she has not had any since. Eczema primarily affects her neck and face, and she uses Protopic cream once a day, which has not been effective. She also uses triamcinolone and clobetasol but is aware that clobetasol is not for facial use. She uses Dove body wash and Eucerin lotion, and her laundry detergent is All Free and Clear. Stress and sweating exacerbate her eczema, and she has experienced infections in the past requiring antibiotics. She takes hydroxyzine 50 mg as needed for itching and Zyrtec daily for allergies. No asthma or reflux symptoms currently.   Her liver enzymes were elevated in January. She does drink alcohol at times.  Recent blood work showed elevated triglycerides. She is not currently on any cholesterol medications.     Patient recently dyed her hair and unsure if that flares her eczema. She also got tattoos in the past to cover up her eczema scarring on the arms.  Assessment and Plan: Tia  is a 38 y.o. female with: Severe eczema Chronic eczema flare-up on neck, face, and other areas. Dupixent lost efficacy. Protopic ineffective.  Reviewed 2025 labs - elevated triglycerides which in itself would not raise concerns in starting a jak2 oral inhibitor but she had elevation in her LFTs which is new compared to 2 years ago which I would be hesitant in starting a jak2 oral inhibitor medication. Medications: Only apply to affected areas that are "rough and red" Face: hydrocortisone 2.5% ointment twice daily as needed to affected areas (up to 7 days in a row); stop when clear and can restart as needed for flares. Use Vtama cream once a day as needed on eczema flares. This is a nonsteroidal cream that can be used anywhere on the body. Sample given.  Prescription sent to [facility] mail order pharmacy - they will contact you regarding shipping and cost.  If not covered let us know. Body:  Use triamcinolone 0.1% ointment twice a day as needed for rash flares. Do not use on the face, neck, armpits or groin area. Do not use more than 3 weeks in a row.  Moisturizer: Triamcinolone-Eucerin once day. For more than twice a day use the following: Aquaphor, Vaseline, Cerave, Cetaphil, Eucerin, Vanicream.  Itching: Take Zyrtec 10mg  in the morning Take hydroxyzine 50mg  1 hour before bed Keep track of rashes and take pictures. Write down what you had done/eaten during flares.  See below for proper skin care. Avoid hair dyes. Use fragrance free and dye free products. No dryer sheets or fabric softener.  Will start paperwork for [facility] - which is a different injectable for eczema. Handout given. 60mg  loading dose then 30mg  every 4 weeks.   Elevated liver enzymes Elevated cholesterol with high triglycerides Follow up with PCP regarding abnormal liver enzymes and elevated triglycerides.   Other allergic rhinitis Controlled. Use Flonase (fluticasone) nasal spray 1-2 sprays per nostril once a day  as needed for nasal congestion.  Nasal saline spray (i.e., Simply Saline) or nasal saline lavage (i.e., NeilMed) is recommended as needed and prior to medicated nasal sprays. Use over the counter antihistamines such as Zyrtec (cetirizine), Claritin (loratadine), Allegra (fexofenadine), or Xyzal (levocetirizine) daily as needed. May take twice a day during allergy flares. May switch antihistamines every few months.  Mild persistent asthma without complication Stable. May use Airsupra rescue inhaler 2 puffs every 4 to 6 hours as needed for shortness of breath, chest tightness, coughing, and wheezing. Do not use more than 12 puffs in 24 hours. May use Airsupra rescue inhaler 2 puffs 5 to 15 minutes prior to strenuous physical activities. Rinse mouth after each use.  Monitor frequency of use - if you need to use it more than twice per week on a consistent basis let us know.   Return in about 2 months (around 05/28/2023).  Meds ordered this encounter  Medications   hydrocortisone 2.5 % ointment    Sig: Apply topically 2 (two) times daily as needed (okay to use on the face/neck. Don't use more than 7 days in a row.).    Dispense:  30 g    Refill:  2   triamcinolone ointment (KENALOG) 0.1 %    Sig: Apply 1 Application topically 2 (two) times daily as needed (rash flare). Do not use on the face, neck, armpits or groin area. Do not use more than 3 weeks in a row.    Dispense:  30 g    Refill:  2   triamcinolone cream (KENALOG) 0.1 %    Sig: Apply 1 Application topically daily. Use as moisturizer.    Dispense:  453 g    Refill:  1    Please mix 1:1 with Eucerin   Tapinarof (VTAMA) 1 % CREA    Sig: Apply 1 Application topically daily as needed (eczema spots).    Dispense:  60 g    Refill:  3    [phone]   Lab Orders  No laboratory test(s) ordered today    Diagnostics: None.    Medication List:  Current Outpatient Medications  Medication Sig Dispense Refill   AIRSUPRA 90-80 MCG/ACT  AERO Inhale 2 puffs into the lungs every 4 (four) hours as needed. 10.7 g 1   albuterol (VENTOLIN HFA) 108 (90 Base) MCG/ACT inhaler TAKE 2 PUFFS BY MOUTH EVERY 6 HOURS AS NEEDED FOR WHEEZE OR SHORTNESS OF BREATH 8.5 each 0   amLODipine (NORVASC) 5 MG tablet Take 1 tablet (5 mg total) by mouth daily. 90 tablet 1   cetirizine (ZYRTEC) 10 MG tablet Take 1 tablet (10 mg total) by mouth daily as needed for allergies. 30 tablet 5   clobetasol ointment (TEMOVATE) 0.05 % Apply topically twice daily to BODY as needed for SEVERE red, sandpaper and thickened like rash.  Do not use on face, groin or armpits.  Use for up to two weeks at a time. 60 g 3   fluticasone (FLONASE) 50 MCG/ACT nasal spray Place 1 spray into both nostrils 2 (two) times daily as needed for allergies or rhinitis. 16 g 5  hydrocortisone 2.5 % ointment Apply topically 2 (two) times daily as needed (okay to use on the face/neck. Don't use more than 7 days in a row.). 30 g 2   hydrOXYzine (ATARAX) 50 MG tablet Take 1 tablet (50 mg total) by mouth every 8 (eight) hours as needed. 270 tablet 1   levocetirizine (XYZAL) 5 MG tablet TAKE 1 TABLET BY MOUTH EVERY DAY IN THE EVENING 90 tablet 0   meloxicam (MOBIC) 7.5 MG tablet Take 1 tablet (7.5 mg total) by mouth daily. 30 tablet 3   PARoxetine (PAXIL) 40 MG tablet Take 1 tablet (40 mg total) by mouth daily. 90 tablet 1   promethazine-dextromethorphan (PROMETHAZINE-DM) 6.25-15 MG/5ML syrup Take 5 mLs by mouth 4 (four) times daily as needed for cough. 240 mL 0   Tapinarof (VTAMA) 1 % CREA Apply 1 Application topically daily as needed (eczema spots). 60 g 3   triamcinolone cream (KENALOG) 0.1 % Apply 1 Application topically daily. Use as moisturizer. 453 g 1   triamcinolone ointment (KENALOG) 0.1 % Apply 1 Application topically 2 (two) times daily as needed (rash flare). Do not use on the face, neck, armpits or groin area. Do not use more than 3 weeks in a row. 30 g 2   Vitamin D, Ergocalciferol,  (DRISDOL) 1.25 MG (50000 UNIT) CAPS capsule Take 1 capsule (50,000 Units total) by mouth every 7 (seven) days. 16 capsule 2   No current facility-administered medications for this visit.   Allergies: Allergies  Allergen Reactions   Doxycycline Photosensitivity   I reviewed her past medical history, social history, family history, and environmental history and no significant changes have been reported from her previous visit.  Review of Systems  Constitutional:  Negative for appetite change, chills, fever and unexpected weight change.  HENT:  Negative for congestion and rhinorrhea.   Eyes:  Negative for itching.  Respiratory:  Negative for cough, chest tightness, shortness of breath and wheezing.   Cardiovascular:  Negative for chest pain.  Gastrointestinal:  Negative for abdominal pain.  Genitourinary:  Negative for difficulty urinating.  Skin:  Positive for rash.  Neurological:  Negative for headaches.    Objective: BP 138/88   Pulse 95   Temp 98.4 F (36.9 C)   Wt 226 lb 14.4 oz (102.9 kg)   SpO2 97%   BMI 43.23 kg/m  Body mass index is 43.23 kg/m. Physical Exam Vitals and nursing note reviewed.  Constitutional:      Appearance: Normal appearance. She is well-developed.  HENT:     Head: Normocephalic and atraumatic.     Right Ear: Tympanic membrane and external ear normal.     Left Ear: Tympanic membrane and external ear normal.     Nose: Nose normal.     Mouth/Throat:     Mouth: Mucous membranes are moist.     Pharynx: Oropharynx is clear.  Eyes:     Conjunctiva/sclera: Conjunctivae normal.  Cardiovascular:     Rate and Rhythm: Normal rate and regular rhythm.     Heart sounds: Normal heart sounds. No murmur heard.    No friction rub. No gallop.  Pulmonary:     Effort: Pulmonary effort is normal.     Breath sounds: Normal breath sounds. No wheezing, rhonchi or rales.  Musculoskeletal:     Cervical back: Neck supple.  Skin:    General: Skin is warm.      Findings: Rash present.     Comments: Eczematous patches throughout the neck. Not infected.  Milder patches on upper extremities b/l.  Neurological:     Mental Status: She is alert and oriented to person, place, and time.  Psychiatric:        Behavior: Behavior normal.    Previous notes and tests were reviewed. The plan was reviewed with the patient/family, and all questions/concerned were addressed.  It was my pleasure to see Breyonna today and participate in her care. Please feel free to contact me with any questions or concerns.  Sincerely,  Wyline Mood, DO Allergy & Immunology  [facility] office: [phone] [facility] office: [phone]

## 2023-03-28 NOTE — Patient Instructions (Addendum)
 Severe Eczema Medications: Only apply to affected areas that are "rough and red" Face: hydrocortisone 2.5% ointment twice daily as needed to affected areas (up to 7 days in a row); stop when clear and can restart as needed for flares. Use Vtama cream once a day as needed on eczema flares. This is a nonsteroidal cream that can be used anywhere on the body. Sample given.  Prescription sent to [facility] mail order pharmacy - they will contact you regarding shipping and cost.  If not covered let us know. Body:  Use triamcinolone 0.1% ointment twice a day as needed for rash flares. Do not use on the face, neck, armpits or groin area. Do not use more than 3 weeks in a row.  Moisturizer: Triamcinolone-Eucerin once day. For more than twice a day use the following: Aquaphor, Vaseline, Cerave, Cetaphil, Eucerin, Vanicream.  Itching: Take Zyrtec 10mg  in the morning Take hydroxyzine 50mg  1 hour before bed  Keep track of rashes and take pictures. Write down what you had done/eaten during flares.  See below for proper skin care. Use fragrance free and dye free products. No dryer sheets or fabric softener.    Will start paperwork for [facility] - which is a different injectable for eczema. Tammy will be in touch with you regarding coverage.  Avoid hair dyes.  Follow up with PCP regarding abnormal liver enzymes and elevated triglycerides.   Other allergic rhinitis Use Flonase (fluticasone) nasal spray 1-2 sprays per nostril once a day as needed for nasal congestion.  Nasal saline spray (i.e., Simply Saline) or nasal saline lavage (i.e., NeilMed) is recommended as needed and prior to medicated nasal sprays. Use over the counter antihistamines such as Zyrtec (cetirizine), Claritin (loratadine), Allegra (fexofenadine), or Xyzal (levocetirizine) daily as needed. May take twice a day during allergy flares. May switch antihistamines every few months.  Asthma May use Airsupra rescue inhaler 2 puffs every 4  to 6 hours as needed for shortness of breath, chest tightness, coughing, and wheezing. Do not use more than 12 puffs in 24 hours. May use Airsupra rescue inhaler 2 puffs 5 to 15 minutes prior to strenuous physical activities. Rinse mouth after each use.  Monitor frequency of use - if you need to use it more than twice per week on a consistent basis let us know.   Follow up in 2 months or sooner if needed.  Skin care recommendations  Bath time: Always use lukewarm water. AVOID very hot or cold water. Keep bathing time to 5-10 minutes. Do NOT use bubble bath. Use a mild soap and use just enough to wash the dirty areas. Do NOT scrub skin vigorously.  After bathing, pat dry your skin with a towel. Do NOT rub or scrub the skin.  Moisturizers and prescriptions:  ALWAYS apply moisturizers immediately after bathing (within 3 minutes). This helps to lock-in moisture. Use the moisturizer several times a day over the whole body. Good summer moisturizers include: Aveeno, CeraVe, Cetaphil. Good winter moisturizers include: Aquaphor, Vaseline, Cerave, Cetaphil, Eucerin, Vanicream. When using moisturizers along with medications, the moisturizer should be applied about one hour after applying the medication to prevent diluting effect of the medication or moisturize around where you applied the medications. When not using medications, the moisturizer can be continued twice daily as maintenance.  Laundry and clothing: Avoid laundry products with added color or perfumes. Use unscented hypo-allergenic laundry products such as Tide free, Cheer free & gentle, and All free and clear.  If the skin still seems  dry or sensitive, you can try double-rinsing the clothes. Avoid tight or scratchy clothing such as wool. Do not use fabric softeners or dyer sheets.

## 2023-03-30 ENCOUNTER — Ambulatory Visit
Admission: EM | Admit: 2023-03-30 | Discharge: 2023-03-30 | Disposition: A | Discharge status: Home / Self Care | Attending: Family Medicine | Admitting: Family Medicine

## 2023-03-30 DIAGNOSIS — J069 Acute upper respiratory infection, unspecified: Secondary | ICD-10-CM

## 2023-03-30 DIAGNOSIS — J4521 Mild intermittent asthma with (acute) exacerbation: Secondary | ICD-10-CM | POA: Diagnosis not present

## 2023-03-30 MED ORDER — PREDNISONE 20 MG PO TABS: 40.0000 mg | ORAL_TABLET | Freq: Every day | ORAL | 0 refills | Status: AC

## 2023-03-30 MED ORDER — ONDANSETRON 4 MG PO TBDP: 4.0000 mg | ORAL_TABLET | Freq: Three times a day (TID) | ORAL | 0 refills | Status: DC | PRN

## 2023-03-30 MED ORDER — BENZONATATE 100 MG PO CAPS: 100.0000 mg | ORAL_CAPSULE | Freq: Three times a day (TID) | ORAL | 0 refills | Status: DC | PRN

## 2023-03-30 NOTE — ED Triage Notes (Signed)
"  I started yesterday with vomiting, then sore throat, body aches, now today with cough (asthma flare-up), no fever known".

## 2023-03-30 NOTE — ED Provider Notes (Signed)
 [facility]    CSN: [id number] Arrival date & time: 03/30/23  1847      History   Chief Complaint Chief Complaint  Patient presents with   Asthma Exacerbation   Pain    HPI [name] [name] is a 38 y.o. female.   HPI Here for congestion and cough and wheezing and nausea/ vomiting and diarrhea.  Symptoms began on March 26, first with vomiting.  She has not thrown up since but continues to be nauseated, though the nausea has improved some also.  She has had nasal congestion and cough and has been wheezing.  She is having about 4 or 5 episodes of loose stools a day.  No blood in any output.  No fever or chills.  She is allergic to doxycycline  She is not sure when her last period was, but has had her tubes tied  Past Medical History:  Diagnosis Date   Admission for sterilization 06/12/2013   Asthma    Breast abscess    Depression    HX of - not on med n, doing well.   Eczema    Family history of anesthesia complication    Grandmother has difficulty waskin up.   Hypertension    Pneumonia    HX only -years ago has had a few times.   Pregnant state, incidental    S/P tubal ligation 06/13/2013   SVD (spontaneous vaginal delivery)    x 2    Patient Active Problem List   Diagnosis Date Noted   Primary hypertension 06/15/2022   Other allergic rhinitis 02/14/2022   Vapes nicotine containing substance 02/14/2022   Weight gain 04/04/2021   Women's annual routine gynecological examination 06/10/2020   Screening examination for STD (sexually transmitted disease) 06/10/2020   Former tobacco use 12/07/2014   Major depressive disorder, recurrent episode, moderate (HCC) 01/13/2014   GAD (generalized anxiety disorder) 01/13/2014   S/P tubal ligation 06/13/2013   Conjunctivitis 10/14/2012   Asthma 03/01/2006   Intrinsic atopic dermatitis 03/01/2006    Past Surgical History:  Procedure Laterality Date   INCISE AND DRAIN ABCESS     left breast x 2    INCISION AND DRAINAGE ABSCESS Left 02/17/2012   Procedure:  DRAINAGEof recurrent breast abscess;  Surgeon: Shelly Rubenstein, MD;  Location: [facility];  Service: General;  Laterality: Left;   LAPAROSCOPIC TUBAL LIGATION Bilateral 06/13/2013   Procedure: LAPAROSCOPIC TUBAL LIGATION;  Surgeon: Sherian Rein, MD;  Location: [facility];  Service: Gynecology;  Laterality: Bilateral;   TUBAL LIGATION      OB History     Gravida  2   Para  2   Term  1   Preterm  1   AB      Living  2      SAB      IAB      Ectopic      Multiple      Live Births  2            Home Medications    Prior to Admission medications   Medication Sig Start Date End Date Taking? Authorizing Provider  AIRSUPRA 90-80 MCG/ACT AERO Inhale 2 puffs into the lungs every 4 (four) hours as needed. 02/09/23  Yes Ferol Luz, MD  albuterol (VENTOLIN HFA) 108 (90 Base) MCG/ACT inhaler TAKE 2 PUFFS BY MOUTH EVERY 6 HOURS AS NEEDED FOR WHEEZE OR SHORTNESS OF BREATH 12/20/22  Yes Storm Frisk, MD  amLODipine (NORVASC) 5 MG tablet Take  1 tablet (5 mg total) by mouth daily. 01/09/23  Yes Claiborne Rigg, NP  benzonatate (TESSALON) 100 MG capsule Take 1 capsule (100 mg total) by mouth 3 (three) times daily as needed for cough. 03/30/23  Yes Zenia Resides, MD  cetirizine (ZYRTEC) 10 MG tablet Take 1 tablet (10 mg total) by mouth daily as needed for allergies. 02/09/23  Yes Ferol Luz, MD  fluticasone (FLONASE) 50 MCG/ACT nasal spray Place 1 spray into both nostrils 2 (two) times daily as needed for allergies or rhinitis. 02/09/23  Yes Ferol Luz, MD  hydrOXYzine (ATARAX) 50 MG tablet Take 1 tablet (50 mg total) by mouth every 8 (eight) hours as needed. 02/09/23  Yes Ferol Luz, MD  levocetirizine (XYZAL) 5 MG tablet TAKE 1 TABLET BY MOUTH EVERY DAY IN THE EVENING 02/05/23  Yes Ferol Luz, MD  meloxicam (MOBIC) 7.5 MG tablet Take 1 tablet (7.5 mg total) by mouth daily. 01/09/23  Yes Claiborne Rigg, NP  ondansetron (ZOFRAN-ODT) 4 MG disintegrating tablet Take 1 tablet (4 mg total) by mouth every 8 (eight) hours as needed for nausea or vomiting. 03/30/23  Yes Zenia Resides, MD  PARoxetine (PAXIL) 40 MG tablet Take 1 tablet (40 mg total) by mouth daily. 02/28/23  Yes Hoy Register, MD  predniSONE (DELTASONE) 20 MG tablet Take 2 tablets (40 mg total) by mouth daily with breakfast for 5 days. 03/30/23 04/04/23 Yes Zenia Resides, MD  clobetasol ointment (TEMOVATE) 0.05 % Apply topically twice daily to BODY as needed for SEVERE red, sandpaper and thickened like rash.  Do not use on face, groin or armpits.  Use for up to two weeks at a time. 02/09/23   Ferol Luz, MD  hydrocortisone 2.5 % ointment Apply topically 2 (two) times daily as needed (okay to use on the face/neck. Don't use more than 7 days in a row.). 03/28/23   Ellamae Sia, DO  Tapinarof (VTAMA) 1 % CREA Apply 1 Application topically daily as needed (eczema spots). 03/28/23   Ellamae Sia, DO  triamcinolone cream (KENALOG) 0.1 % Apply 1 Application topically daily. Use as moisturizer. 03/28/23   Ellamae Sia, DO  triamcinolone ointment (KENALOG) 0.1 % Apply 1 Application topically 2 (two) times daily as needed (rash flare). Do not use on the face, neck, armpits or groin area. Do not use more than 3 weeks in a row. 03/28/23   Ellamae Sia, DO  Vitamin D, Ergocalciferol, (DRISDOL) 1.25 MG (50000 UNIT) CAPS capsule Take 1 capsule (50,000 Units total) by mouth every 7 (seven) days. 06/15/22   Storm Frisk, MD  famotidine (PEPCID) 20 MG tablet Take 1 tablet (20 mg total) by mouth 2 (two) times daily. 01/03/18 06/26/19  Michela Pitcher A, PA-C  gabapentin (NEURONTIN) 100 MG capsule Take 1 capsule (100 mg total) by mouth 3 (three) times daily. 11/21/18 06/26/19  Fayrene Helper, PA-C    Family History Family History  Problem Relation Age of Onset   Healthy Mother    Anxiety disorder Mother    Healthy Father    Stroke Maternal Grandmother     Stroke Paternal Grandfather    Bipolar disorder Neg Hx    Depression Neg Hx     Social History Social History   Tobacco Use   Smoking status: Former    Current packs/day: 0.00    Average packs/day: 1 pack/day for 5.0 years (5.0 ttl pk-yrs)    Types: Cigarettes    Start date: 2016  Quit date: 2021    Years since quitting: 4.2   Smokeless tobacco: Never  Vaping Use   Vaping status: Every Day   Substances: Nicotine, Flavoring  Substance Use Topics   Alcohol use: Yes    Comment: occasional   Drug use: No     Allergies   Doxycycline   Review of Systems Review of Systems   Physical Exam Triage Vital Signs ED Triage Vitals  Encounter Vitals Group     BP 03/30/23 1907 110/77     Systolic BP Percentile --      Diastolic BP Percentile --      Pulse Rate 03/30/23 1907 62     Resp 03/30/23 1907 20     Temp 03/30/23 1907 98.1 F (36.7 C)     Temp Source 03/30/23 1907 Oral     SpO2 03/30/23 1907 96 %     Weight 03/30/23 1903 220 lb (99.8 kg)     Height 03/30/23 1903 5' (1.524 m)     Head Circumference --      Peak Flow --      Pain Score 03/30/23 1900 5     Pain Loc --      Pain Education --      Exclude from Growth Chart --    No data found.  Updated Vital Signs BP 110/77 (BP Location: Left Arm)   Pulse 62   Temp 98.1 F (36.7 C) (Oral)   Resp 20   Ht 5' (1.524 m)   Wt 99.8 kg   LMP  (Exact Date)   SpO2 96%   BMI 42.97 kg/m   Visual Acuity Right Eye Distance:   Left Eye Distance:   Bilateral Distance:    Right Eye Near:   Left Eye Near:    Bilateral Near:     Physical Exam Vitals reviewed.  Constitutional:      General: She is not in acute distress.    Appearance: She is not ill-appearing, toxic-appearing or diaphoretic.  HENT:     Right Ear: Tympanic membrane and ear canal normal.     Left Ear: Tympanic membrane and ear canal normal.     Nose: Congestion present.     Mouth/Throat:     Mouth: Mucous membranes are moist.     Comments:  There is  mucous in the oropharynx. Eyes:     Extraocular Movements: Extraocular movements intact.     Conjunctiva/sclera: Conjunctivae normal.     Pupils: Pupils are equal, round, and reactive to light.  Cardiovascular:     Rate and Rhythm: Normal rate and regular rhythm.     Heart sounds: No murmur heard. Pulmonary:     Effort: No respiratory distress.     Breath sounds: No stridor. No rhonchi or rales.     Comments: There is some expiratory wheezing heard more anteriorly than posteriorly, but good air movement. Chest:     Chest wall: No tenderness.  Musculoskeletal:     Cervical back: Neck supple.  Lymphadenopathy:     Cervical: No cervical adenopathy.  Skin:    Capillary Refill: Capillary refill takes less than 2 seconds.     Coloration: Skin is not jaundiced or pale.  Neurological:     General: No focal deficit present.     Mental Status: She is alert and oriented to person, place, and time.  Psychiatric:        Behavior: Behavior normal.      UC Treatments / Results  Labs (all labs ordered are listed, but only abnormal results are displayed) Labs Reviewed  SARS CORONAVIRUS 2 (TAT 6-24 HRS)    EKG   Radiology No results found.  Procedures Procedures (including critical care time)  Medications Ordered in UC Medications - No data to display  Initial Impression / Assessment and Plan / UC Course  I have reviewed the triage vital signs and the nursing notes.  Pertinent labs & imaging results that were available during my care of the patient were reviewed by me and considered in my medical decision making (see chart for details).     She has an albuterol inhaler at home.  Prednisone is sent in for asthma exacerbation as her test was.  Also Zofran as the nausea. COVID swab is done and if positive she is a candidate for Paxlovid.  Her last EGFR in January of this year was 43 and she has asthma and elevated BMI. Final Clinical Impressions(s) / UC Diagnoses    Final diagnoses:  Viral URI  Mild intermittent asthma with acute exacerbation     Discharge Instructions      Take prednisone 20 mg--2 daily for 5 days  Take benzonatate 100 mg, 1 tab every 8 hours as needed for cough.  Ondansetron dissolved in the mouth every 8 hours as needed for nausea or vomiting. Clear liquids(water, gatorade/pedialyte, ginger ale/sprite, chicken broth/soup) and bland things(crackers/toast, rice, potato, bananas) to eat. Avoid acidic foods like lemon/lime/orange/tomato, and avoid greasy/spicy foods.   You have been swabbed for COVID, and the test will result in the next 24 hours. Our staff will call you if positive. If the COVID test is positive, you should quarantine until you are fever free for 24 hours and you are starting to feel better, and then take added precautions for the next 5 days, such as physical distancing/wearing a mask and good hand hygiene/washing.  If your MyChart sends you a message that you have a positive COVID test, please call in to our facility tomorrow.  We do not have a callback nurse on weekends.      ED Prescriptions     Medication Sig Dispense Auth. Provider   benzonatate (TESSALON) 100 MG capsule Take 1 capsule (100 mg total) by mouth 3 (three) times daily as needed for cough. 21 capsule Zenia Resides, MD   ondansetron (ZOFRAN-ODT) 4 MG disintegrating tablet Take 1 tablet (4 mg total) by mouth every 8 (eight) hours as needed for nausea or vomiting. 10 tablet Zenia Resides, MD   predniSONE (DELTASONE) 20 MG tablet Take 2 tablets (40 mg total) by mouth daily with breakfast for 5 days. 10 tablet Marlinda Mike Janace Aris, MD      PDMP not reviewed this encounter.   Zenia Resides, MD 03/30/23 Ernestina Columbia

## 2023-03-30 NOTE — Discharge Instructions (Addendum)
 Take prednisone 20 mg--2 daily for 5 days  Take benzonatate 100 mg, 1 tab every 8 hours as needed for cough.  Ondansetron dissolved in the mouth every 8 hours as needed for nausea or vomiting. Clear liquids(water, gatorade/pedialyte, ginger ale/sprite, chicken broth/soup) and bland things(crackers/toast, rice, potato, bananas) to eat. Avoid acidic foods like lemon/lime/orange/tomato, and avoid greasy/spicy foods.   You have been swabbed for COVID, and the test will result in the next 24 hours. Our staff will call you if positive. If the COVID test is positive, you should quarantine until you are fever free for 24 hours and you are starting to feel better, and then take added precautions for the next 5 days, such as physical distancing/wearing a mask and good hand hygiene/washing.  If your MyChart sends you a message that you have a positive COVID test, please call in to our facility tomorrow.  We do not have a callback nurse on weekends.

## 2023-03-31 LAB — SARS CORONAVIRUS 2 (TAT 6-24 HRS): SARS Coronavirus 2: NEGATIVE

## 2023-04-02 ENCOUNTER — Encounter: Payer: Self-pay | Admitting: *Deleted

## 2023-04-02 ENCOUNTER — Telehealth: Payer: Self-pay | Admitting: *Deleted

## 2023-04-02 NOTE — Telephone Encounter (Signed)
 Called patient and advised approval, copay card and submit to [facility] for [facility] with instructions on 1st injection in clinic for instruction

## 2023-04-02 NOTE — Telephone Encounter (Signed)
-----   Message from Ellamae Sia sent at 03/28/2023  5:02 PM EDT ----- Please start PA for [facility] for AD. 60mg  loading dose then 30mg  every 4 weeks. Failed Dupixent. Thank you.

## 2023-04-04 ENCOUNTER — Other Ambulatory Visit: Payer: Self-pay | Admitting: Critical Care Medicine

## 2023-04-18 ENCOUNTER — Ambulatory Visit: Payer: Self-pay

## 2023-04-25 ENCOUNTER — Encounter: Payer: Self-pay | Admitting: Emergency Medicine

## 2023-04-25 ENCOUNTER — Ambulatory Visit: Admission: EM | Admit: 2023-04-25 | Discharge: 2023-04-25 | Disposition: A | Discharge status: Home / Self Care

## 2023-04-25 DIAGNOSIS — S93491A Sprain of other ligament of right ankle, initial encounter: Secondary | ICD-10-CM

## 2023-04-25 DIAGNOSIS — S99921A Unspecified injury of right foot, initial encounter: Secondary | ICD-10-CM

## 2023-04-25 NOTE — ED Provider Notes (Signed)
 [facility]    CSN: [id number] Arrival date & time: 04/25/23  1721      History   Chief Complaint Chief Complaint  Patient presents with   Foot Pain   Knee Pain    HPI [name] [name] is a 38 y.o. female.   Patient presents to clinic over concern of right foot and ankle pain that has been present for the past 3 days.  She initially fell inside and then fell outside on concrete, twisting her foot inward both times.  Immediately after the second fall she had pain and then pain with standing.  She has been weightbearing over the past 3 days, but has spent the past 2 days in bed resting.  Has been taking ibuprofen .  Some knee discomfort as well.  Did not hit her knee, twisted her knee, no swelling or bruising.  Is ambulatory.  Reports swelling has improved greatly since Monday.  The history is provided by the patient and medical records.  Foot Pain  Knee Pain   Past Medical History:  Diagnosis Date   Admission for sterilization 06/12/2013   Asthma    Breast abscess    Depression    HX of - not on med n, doing well.   Eczema    Family history of anesthesia complication    Grandmother has difficulty waskin up.   Hypertension    Pneumonia    HX only -years ago has had a few times.   Pregnant state, incidental    S/P tubal ligation 06/13/2013   SVD (spontaneous vaginal delivery)    x 2    Patient Active Problem List   Diagnosis Date Noted   Primary hypertension 06/15/2022   Other allergic rhinitis 02/14/2022   Vapes nicotine  containing substance 02/14/2022   Weight gain 04/04/2021   Women's annual routine gynecological examination 06/10/2020   Screening examination for STD (sexually transmitted disease) 06/10/2020   Former tobacco use 12/07/2014   Major depressive disorder, recurrent episode, moderate (HCC) 01/13/2014   GAD (generalized anxiety disorder) 01/13/2014   S/P tubal ligation 06/13/2013   Conjunctivitis 10/14/2012   Asthma 03/01/2006    Intrinsic atopic dermatitis 03/01/2006    Past Surgical History:  Procedure Laterality Date   INCISE AND DRAIN ABCESS     left breast x 2   INCISION AND DRAINAGE ABSCESS Left 02/17/2012   Procedure:  DRAINAGEof recurrent breast abscess;  Surgeon: Rogena Class, MD;  Location: [facility];  Service: General;  Laterality: Left;   LAPAROSCOPIC TUBAL LIGATION Bilateral 06/13/2013   Procedure: LAPAROSCOPIC TUBAL LIGATION;  Surgeon: Margaretmary Shaver, MD;  Location: [facility];  Service: Gynecology;  Laterality: Bilateral;   TUBAL LIGATION      OB History     Gravida  2   Para  2   Term  1   Preterm  1   AB      Living  2      SAB      IAB      Ectopic      Multiple      Live Births  2            Home Medications    Prior to Admission medications   Medication Sig Start Date End Date Taking? Authorizing Provider  NEMLUVIO 30 MG SQ injection Inject into the skin. 04/25/23  Yes [provider]  tacrolimus  (PROTOPIC ) 0.1 % ointment Apply 1 Application topically 2 (two) times daily. 04/04/23  Yes [provider]  AIRSUPRA  90-80 MCG/ACT AERO Inhale 2 puffs into the lungs every 4 (four) hours as needed. 02/09/23   Orelia Binet, MD  albuterol  (VENTOLIN  HFA) 108 (90 Base) MCG/ACT inhaler TAKE 2 PUFFS BY MOUTH EVERY 6 HOURS AS NEEDED FOR WHEEZE OR SHORTNESS OF BREATH 04/05/23   Newlin, Enobong, MD  amLODipine  (NORVASC ) 5 MG tablet Take 1 tablet (5 mg total) by mouth daily. 01/09/23   Fleming, Zelda W, NP  benzonatate  (TESSALON ) 100 MG capsule Take 1 capsule (100 mg total) by mouth 3 (three) times daily as needed for cough. 03/30/23   Ann Keto, MD  cetirizine  (ZYRTEC ) 10 MG tablet Take 1 tablet (10 mg total) by mouth daily as needed for allergies. 02/09/23   Orelia Binet, MD  clobetasol  ointment (TEMOVATE ) 0.05 % Apply topically twice daily to BODY as needed for SEVERE red, sandpaper and thickened like rash.  Do not use on face, groin or armpits.  Use  for up to two weeks at a time. 02/09/23   Orelia Binet, MD  fluticasone  (FLONASE ) 50 MCG/ACT nasal spray Place 1 spray into both nostrils 2 (two) times daily as needed for allergies or rhinitis. 02/09/23   Orelia Binet, MD  hydrocortisone  2.5 % ointment Apply topically 2 (two) times daily as needed (okay to use on the face/neck. Don't use more than 7 days in a row.). 03/28/23   Trudy Fusi, DO  hydrOXYzine  (ATARAX ) 50 MG tablet Take 1 tablet (50 mg total) by mouth every 8 (eight) hours as needed. 02/09/23   Orelia Binet, MD  levocetirizine (XYZAL ) 5 MG tablet TAKE 1 TABLET BY MOUTH EVERY DAY IN THE EVENING 02/05/23   Orelia Binet, MD  meloxicam  (MOBIC ) 7.5 MG tablet Take 1 tablet (7.5 mg total) by mouth daily. 01/09/23   Fleming, Zelda W, NP  ondansetron  (ZOFRAN -ODT) 4 MG disintegrating tablet Take 1 tablet (4 mg total) by mouth every 8 (eight) hours as needed for nausea or vomiting. 03/30/23   Ann Keto, MD  PARoxetine  (PAXIL ) 40 MG tablet Take 1 tablet (40 mg total) by mouth daily. 02/28/23   Newlin, Enobong, MD  Tapinarof  (VTAMA ) 1 % CREA Apply 1 Application topically daily as needed (eczema spots). 03/28/23   Trudy Fusi, DO  triamcinolone  cream (KENALOG ) 0.1 % Apply 1 Application topically daily. Use as moisturizer. 03/28/23   Trudy Fusi, DO  triamcinolone  ointment (KENALOG ) 0.1 % Apply 1 Application topically 2 (two) times daily as needed (rash flare). Do not use on the face, neck, armpits or groin area. Do not use more than 3 weeks in a row. 03/28/23   Trudy Fusi, DO  Vitamin D , Ergocalciferol , (DRISDOL ) 1.25 MG (50000 UNIT) CAPS capsule Take 1 capsule (50,000 Units total) by mouth every 7 (seven) days. 06/15/22   Vernell Goldsmith, MD  famotidine  (PEPCID ) 20 MG tablet Take 1 tablet (20 mg total) by mouth 2 (two) times daily. 01/03/18 06/26/19  Fawze, Mina A, PA-C  gabapentin  (NEURONTIN ) 100 MG capsule Take 1 capsule (100 mg total) by mouth 3 (three) times daily. 11/21/18 06/26/19  Debbra Fairy, PA-C    Family History Family History  Problem Relation Age of Onset   Healthy Mother    Anxiety disorder Mother    Healthy Father    Stroke Maternal Grandmother    Stroke Paternal Grandfather    Bipolar disorder Neg Hx    Depression Neg Hx     Social History Social History   Tobacco Use  Smoking status: Former    Current packs/day: 0.00    Average packs/day: 1 pack/day for 5.0 years (5.0 ttl pk-yrs)    Types: Cigarettes    Start date: 2016    Quit date: 2021    Years since quitting: 4.3   Smokeless tobacco: Never  Vaping Use   Vaping status: Every Day   Substances: Nicotine , Flavoring  Substance Use Topics   Alcohol  use: Yes    Comment: occasional   Drug use: No     Allergies   Doxycycline    Review of Systems Review of Systems  Per HPI  Physical Exam Triage Vital Signs ED Triage Vitals  Encounter Vitals Group     BP 04/25/23 1752 (!) 142/88     Systolic BP Percentile --      Diastolic BP Percentile --      Pulse Rate 04/25/23 1752 (!) 106     Resp 04/25/23 1752 18     Temp 04/25/23 1752 98.2 F (36.8 C)     Temp Source 04/25/23 1752 Oral     SpO2 04/25/23 1752 97 %     Weight 04/25/23 1752 220 lb 0.3 oz (99.8 kg)     Height --      Head Circumference --      Peak Flow --      Pain Score 04/25/23 1749 3     Pain Loc --      Pain Education --      Exclude from Growth Chart --    No data found.  Updated Vital Signs BP (!) 142/88   Pulse (!) 106   Temp 98.2 F (36.8 C) (Oral)   Resp 18   Wt 220 lb 0.3 oz (99.8 kg)   SpO2 97%   BMI 42.97 kg/m   Visual Acuity Right Eye Distance:   Left Eye Distance:   Bilateral Distance:    Right Eye Near:   Left Eye Near:    Bilateral Near:     Physical Exam Vitals and nursing note reviewed.  Constitutional:      Appearance: Normal appearance.  HENT:     Head: Normocephalic and atraumatic.     Right Ear: External ear normal.     Left Ear: External ear normal.     Nose: Nose normal.      Mouth/Throat:     Mouth: Mucous membranes are moist.  Eyes:     Conjunctiva/sclera: Conjunctivae normal.  Cardiovascular:     Rate and Rhythm: Normal rate.     Pulses: Normal pulses.          Dorsalis pedis pulses are 2+ on the right side.       Posterior tibial pulses are 2+ on the right side.  Pulmonary:     Effort: Pulmonary effort is normal. No respiratory distress.  Musculoskeletal:        General: Swelling, tenderness and signs of injury present.       Feet:  Skin:    General: Skin is warm and dry.     Capillary Refill: Capillary refill takes less than 2 seconds.  Neurological:     General: No focal deficit present.     Mental Status: She is alert.      UC Treatments / Results  Labs (all labs ordered are listed, but only abnormal results are displayed) Labs Reviewed - No data to display  EKG   Radiology No results found.  Procedures Procedures (including critical care time)  Medications Ordered  in UC Medications - No data to display  Initial Impression / Assessment and Plan / UC Course  I have reviewed the triage vital signs and the nursing notes.  Pertinent labs & imaging results that were available during my care of the patient were reviewed by me and considered in my medical decision making (see chart for details).  Vitals in triage reviewed, patient is hemodynamically stable.  Right foot with bruising along metatarsals, mild discomfort to palpation along fifth metatarsal and along lateral ankle.  Reports swelling is improved.  Brisk capillary refill in all toes, pedal pulses are 2+.  Discussed ideally, would like imaging to rule out any fractures.  Reassuring she is ambulatory.  Due to lack of imaging at facility, offered outpatient imaging, patient declined and would like Ace wrap.  Comfortable with this plan as patient is weightbearing.  Encouraged orthopedic follow-up in the next week if no improvement to the bruising, pain or swelling.  Plan of  care, follow-up care return precautions given, no questions at this time.     Final Clinical Impressions(s) / UC Diagnoses   Final diagnoses:  Sprain of anterior talofibular ligament of right ankle, initial encounter  Foot injury, right, initial encounter     Discharge Instructions      Please rest, ice, compress and elevate your foot.  800 mg of ibuprofen  every 8 hours can help with pain and inflammation.  Keep the Ace wrap on it to help with swelling.  If your pain does not improve over the next week, return to clinic or follow-up with an orthopedic for imaging such as x-ray.    ED Prescriptions   None    PDMP not reviewed this encounter.   Harlow Lighter, Akia Montalban  N, FNP 04/25/23 1830

## 2023-04-25 NOTE — ED Triage Notes (Addendum)
 Pt presents with right ankle pain from falling down the stairs twice on Monday. Pt also complains of knee pain and toe pain on right leg and foot.

## 2023-04-25 NOTE — Discharge Instructions (Signed)
 Please rest, ice, compress and elevate your foot.  800 mg of ibuprofen  every 8 hours can help with pain and inflammation.  Keep the Ace wrap on it to help with swelling.  If your pain does not improve over the next week, return to clinic or follow-up with an orthopedic for imaging such as x-ray.

## 2023-04-26 DIAGNOSIS — M79671 Pain in right foot: Secondary | ICD-10-CM | POA: Insufficient documentation

## 2023-04-26 DIAGNOSIS — M25579 Pain in unspecified ankle and joints of unspecified foot: Secondary | ICD-10-CM | POA: Insufficient documentation

## 2023-05-01 ENCOUNTER — Ambulatory Visit
Admission: EM | Admit: 2023-05-01 | Discharge: 2023-05-01 | Disposition: A | Discharge status: Home / Self Care | Attending: Family Medicine | Admitting: Family Medicine

## 2023-05-01 DIAGNOSIS — G44201 Tension-type headache, unspecified, intractable: Secondary | ICD-10-CM | POA: Diagnosis not present

## 2023-05-01 MED ORDER — DEXAMETHASONE SODIUM PHOSPHATE 10 MG/ML IJ SOLN
10.0000 mg | Freq: Once | INTRAMUSCULAR | Status: AC
  Administered 2023-05-01: 10 mg via INTRAMUSCULAR

## 2023-05-01 MED ORDER — ONDANSETRON HCL 4 MG PO TABS: 4.0000 mg | ORAL_TABLET | Freq: Three times a day (TID) | ORAL | 0 refills | Status: DC | PRN

## 2023-05-01 MED ORDER — KETOROLAC TROMETHAMINE 30 MG/ML IJ SOLN
30.0000 mg | Freq: Once | INTRAMUSCULAR | Status: AC
  Administered 2023-05-01: 30 mg via INTRAMUSCULAR

## 2023-05-01 MED ORDER — METOCLOPRAMIDE HCL 5 MG/ML IJ SOLN
10.0000 mg | Freq: Once | INTRAMUSCULAR | Status: AC
  Administered 2023-05-01: 10 mg via INTRAMUSCULAR

## 2023-05-01 MED ORDER — TIZANIDINE HCL 4 MG PO TABS: 4.0000 mg | ORAL_TABLET | Freq: Four times a day (QID) | ORAL | 0 refills | Status: DC | PRN

## 2023-05-01 MED ORDER — NAPROXEN 375 MG PO TABS: 375.0000 mg | ORAL_TABLET | Freq: Two times a day (BID) | ORAL | 0 refills | Status: DC
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
## 2023-06-01 ENCOUNTER — Ambulatory Visit: Payer: Managed Care, Other (non HMO) | Admitting: Internal Medicine

## 2023-06-01 ENCOUNTER — Encounter: Payer: Self-pay | Admitting: Internal Medicine

## 2023-06-01 VITALS — BP 124/76 | HR 101 | Resp 16 | Ht 60.75 in | Wt 225.0 lb

## 2023-06-01 DIAGNOSIS — L309 Dermatitis, unspecified: Secondary | ICD-10-CM

## 2023-06-01 DIAGNOSIS — J3089 Other allergic rhinitis: Secondary | ICD-10-CM

## 2023-06-01 DIAGNOSIS — E782 Mixed hyperlipidemia: Secondary | ICD-10-CM

## 2023-06-01 DIAGNOSIS — J4530 Mild persistent asthma, uncomplicated: Secondary | ICD-10-CM | POA: Diagnosis not present

## 2023-06-01 DIAGNOSIS — H1045 Other chronic allergic conjunctivitis: Secondary | ICD-10-CM

## 2023-06-01 DIAGNOSIS — R748 Abnormal levels of other serum enzymes: Secondary | ICD-10-CM

## 2023-06-01 NOTE — Progress Notes (Signed)
 FOLLOW UP Date of Service/Encounter:  06/01/23  Subjective:  [name] [name] (DOB: [birth year]) is a 38 y.o. female who [facility] in re-evaluation of the following: severe eczema, asthma, rhinitis  History obtained from: chart review and patient.  For Review, LV was on 03/28/23  with Dr. Burdette Carolin seen for routine follow-up. See below for summary of history and diagnostics.   Therapeutic plans/changes recommended: started on nemluvio.  Was not a candidate for Jak inhibitors due to elevated triglycerides and liver enzymes.  Today presents for follow-up. Discussed the use of AI scribe software for clinical note transcription with the patient, who gave verbal consent to proceed.  History of Present Illness [name] [name] is a 38 year old female with eczema and asthma who presents for follow-up on her current treatment regimen.  She has a history of eczema and was previously on Dupixent , which she felt was losing its effectiveness. She was then prescribed Nemluvio at last visit and vtama  for flares on face, which has effectively managed her symptoms with a reduction in itching and rashes. She has not experienced significant side effects, although she has noticed dry skin in her ears, which is a new symptom. No itching in her ears.   Regarding her asthma, she was previously on Dupixent , which helped manage both her eczema and asthma. Since switching to Nemluvio, which is not approved for asthma, she has not noticed a significant increase in asthma symptoms. She continues to use albuterol  as needed, approximately every other day, and is also using Flonase  and Zyrtec  for upper airway symptoms, which are well-controlled.  She is currently using Vtama  cream, which she finds effective for her skin condition. No itching or rashes have been reported since starting Nemluvio. She reports dry skin in her ears but no itching. No significant increase in asthma  symptoms since switching from Dupixent  to Nimluvio.     All medications reviewed by clinical staff and updated in chart. No new pertinent medical or surgical history except as noted in HPI.  ROS: All others negative except as noted per HPI.   Objective:  BP 124/76   Pulse (!) 101   Resp 16   Ht 5' 0.75" (1.543 m)   Wt 225 lb (102.1 kg)   SpO2 98%   BMI 42.86 kg/m  Body mass index is 42.86 kg/m. Physical Exam: General Appearance:  Alert, cooperative, no distress, appears stated age  Head:  Normocephalic, without obvious abnormality, atraumatic  Eyes:  Conjunctiva clear, EOM's intact  Ears Flaking on external ear, no eczematous patches , normal TMs bilaterally, and ear tubes present bilaterally without exudate  Nose: Nares normal, normal mucosa, no visible anterior polyps, and septum midline  Throat: Lips, tongue normal; teeth and gums normal, normal posterior oropharynx  Neck: Supple, symmetrical  Lungs:   clear to auscultation bilaterally, Respirations unlabored, no coughing  Heart:  regular rate and rhythm and no murmur, Appears well perfused  Extremities: No edema  Skin: Mild xerosis of ears  and no rashes or lesions on visualized portions of skin  Neurologic: No gross deficits   Labs:  Lab Orders  No laboratory test(s) ordered today    Spirometry:  Tracings reviewed. Her effort: Good reproducible efforts. FVC: 2.60L FEV1: 2.12L, 77% predicted FEV1/FVC ratio: 82% Interpretation: Spirometry consistent with possible restrictive disease. - at baseline  Please see scanned spirometry results for details.   Assessment/Plan   Patient Instructions  Severe Eczema Medications: Continue nemluvio injections  every 4 weeks  Only apply to affected areas that are "rough and red" Face: hydrocortisone  2.5% ointment twice daily as needed to affected areas (up to 7 days in a row); stop when clear and can restart as needed for flares. Use Vtama  cream once a day as needed on  eczema flares.   Body:  Use triamcinolone  0.1% ointment twice a day as needed for rash flares. Do not use on the face, neck, armpits or groin area. Do not use more than 3 weeks in a row.  Moisturizer: Triamcinolone -Eucerin once day. For more than twice a day use the following: Aquaphor, Vaseline, Cerave, Cetaphil, Eucerin, Vanicream.  Itching: Take Zyrtec  10mg  in the morning Take hydroxyzine  50mg  1 hour before bed  Keep track of rashes and take pictures. Write down what you had done/eaten during flares.  See below for proper skin care. Use fragrance free and dye free products. No dryer sheets or fabric softener.    Avoid hair dyes.  Follow up with PCP regarding abnormal liver enzymes and elevated triglycerides.   Other allergic rhinitis Use Flonase  (fluticasone ) nasal spray 1-2 sprays per nostril once a day as needed for nasal congestion.  Nasal saline spray (i.e., Simply Saline) or nasal saline lavage (i.e., NeilMed) is recommended as needed and prior to medicated nasal sprays. Use over the counter antihistamines such as Zyrtec  (cetirizine ), Claritin  (loratadine ), Allegra (fexofenadine), or Xyzal  (levocetirizine) daily as needed. May take twice a day during allergy flares. May switch antihistamines every few months.  Asthma Breathing test looked great!  May use Airsupra  rescue inhaler 2 puffs every 4 to 6 hours as needed for shortness of breath, chest tightness, coughing, and wheezing. Do not use more than 12 puffs in 24 hours. May use Airsupra  rescue inhaler 2 puffs 5 to 15 minutes prior to strenuous physical activities. Rinse mouth after each use.  Monitor frequency of use - if you need to use it more than twice per week on a consistent basis let us  know.   Follow up in 6 months or sooner if needed.  Skin care recommendations  Bath time: Always use lukewarm water. AVOID very hot or cold water. Keep bathing time to 5-10 minutes. Do NOT use bubble bath. Use a mild soap and use  just enough to wash the dirty areas. Do NOT scrub skin vigorously.  After bathing, pat dry your skin with a towel. Do NOT rub or scrub the skin.  Moisturizers and prescriptions:  ALWAYS apply moisturizers immediately after bathing (within 3 minutes). This helps to lock-in moisture. Use the moisturizer several times a day over the whole body. Good summer moisturizers include: Aveeno, CeraVe, Cetaphil. Good winter moisturizers include: Aquaphor, Vaseline, Cerave, Cetaphil, Eucerin, Vanicream. When using moisturizers along with medications, the moisturizer should be applied about one hour after applying the medication to prevent diluting effect of the medication or moisturize around where you applied the medications. When not using medications, the moisturizer can be continued twice daily as maintenance.  Laundry and clothing: Avoid laundry products with added color or perfumes. Use unscented hypo-allergenic laundry products such as Tide free, Cheer free & gentle, and All free and clear.  If the skin still seems dry or sensitive, you can try double-rinsing the clothes. Avoid tight or scratchy clothing such as wool. Do not use fabric softeners or dyer sheets.  Other:    Thank you so much for letting me partake in your care today.  Don't hesitate to reach out if you have any additional  concerns!  Orelia Binet, MD  [facility] [place], [place]

## 2023-06-01 NOTE — Patient Instructions (Addendum)
 Severe Eczema Medications: Continue nemluvio injections every 4 weeks  Only apply to affected areas that are "rough and red" Face: hydrocortisone  2.5% ointment twice daily as needed to affected areas (up to 7 days in a row); stop when clear and can restart as needed for flares. Use Vtama  cream once a day as needed on eczema flares.   Body:  Use triamcinolone  0.1% ointment twice a day as needed for rash flares. Do not use on the face, neck, armpits or groin area. Do not use more than 3 weeks in a row.  Moisturizer: Triamcinolone -Eucerin once day. For more than twice a day use the following: Aquaphor, Vaseline, Cerave, Cetaphil, Eucerin, Vanicream.  Itching: Take Zyrtec  10mg  in the morning Take hydroxyzine  50mg  1 hour before bed  Keep track of rashes and take pictures. Write down what you had done/eaten during flares.  See below for proper skin care. Use fragrance free and dye free products. No dryer sheets or fabric softener.    Avoid hair dyes.  Follow up with PCP regarding abnormal liver enzymes and elevated triglycerides.   Other allergic rhinitis Use Flonase  (fluticasone ) nasal spray 1-2 sprays per nostril once a day as needed for nasal congestion.  Nasal saline spray (i.e., Simply Saline) or nasal saline lavage (i.e., NeilMed) is recommended as needed and prior to medicated nasal sprays. Use over the counter antihistamines such as Zyrtec  (cetirizine ), Claritin  (loratadine ), Allegra (fexofenadine), or Xyzal  (levocetirizine) daily as needed. May take twice a day during allergy flares. May switch antihistamines every few months.  Asthma Breathing test looked great!  May use Airsupra  rescue inhaler 2 puffs every 4 to 6 hours as needed for shortness of breath, chest tightness, coughing, and wheezing. Do not use more than 12 puffs in 24 hours. May use Airsupra  rescue inhaler 2 puffs 5 to 15 minutes prior to strenuous physical activities. Rinse mouth after each use.  Monitor frequency  of use - if you need to use it more than twice per week on a consistent basis let us  know.   Follow up in 6 months or sooner if needed.  Skin care recommendations  Bath time: Always use lukewarm water. AVOID very hot or cold water. Keep bathing time to 5-10 minutes. Do NOT use bubble bath. Use a mild soap and use just enough to wash the dirty areas. Do NOT scrub skin vigorously.  After bathing, pat dry your skin with a towel. Do NOT rub or scrub the skin.  Moisturizers and prescriptions:  ALWAYS apply moisturizers immediately after bathing (within 3 minutes). This helps to lock-in moisture. Use the moisturizer several times a day over the whole body. Good summer moisturizers include: Aveeno, CeraVe, Cetaphil. Good winter moisturizers include: Aquaphor, Vaseline, Cerave, Cetaphil, Eucerin, Vanicream. When using moisturizers along with medications, the moisturizer should be applied about one hour after applying the medication to prevent diluting effect of the medication or moisturize around where you applied the medications. When not using medications, the moisturizer can be continued twice daily as maintenance.  Laundry and clothing: Avoid laundry products with added color or perfumes. Use unscented hypo-allergenic laundry products such as Tide free, Cheer free & gentle, and All free and clear.  If the skin still seems dry or sensitive, you can try double-rinsing the clothes. Avoid tight or scratchy clothing such as wool. Do not use fabric softeners or dyer sheets.

## 2023-06-11 ENCOUNTER — Encounter: Payer: Self-pay | Admitting: Podiatry

## 2023-06-11 ENCOUNTER — Ambulatory Visit (INDEPENDENT_AMBULATORY_CARE_PROVIDER_SITE_OTHER): Admitting: Podiatry

## 2023-06-11 VITALS — Ht 60.75 in | Wt 225.0 lb

## 2023-06-11 DIAGNOSIS — M7662 Achilles tendinitis, left leg: Secondary | ICD-10-CM

## 2023-06-11 MED ORDER — TRIAMCINOLONE ACETONIDE 10 MG/ML IJ SUSP
10.0000 mg | Freq: Once | INTRAMUSCULAR | Status: AC
  Administered 2023-06-11: 10 mg via INTRA_ARTICULAR

## 2023-06-13 NOTE — Progress Notes (Signed)
 Subjective:   Patient ID: [name] [name], female   DOB: 38 y.o.   MRN: [id number]   HPI Patient states I was doing well but recently I have started to get a lot of pain in my ankle again and I was very active and I cannot do anything with this now and I am hoping for 1 more injection   ROS      Objective:  Physical Exam  Neurovascular status intact with inflammation pain to the posterior heel left at the insertion of the Achilles mostly on the lateral side central medial not involved     Assessment:  Achilles tendinitis left localized with no indication of other pathology lateral side     Plan:  H&P reviewed I discussed the importance of immobilization that eventually is gena require surgery if it does not get better.  She is willing to accept the risk of another injection understands chances for rupture and today I went ahead did sterile prep injected the lateral side 3 mg dexamethasone  Kenalog  5 mg Xylocaine  applied sterile dressing and reappoint to recheck continue immobilization during the recovery.

## 2023-06-21 ENCOUNTER — Ambulatory Visit
Admission: EM | Admit: 2023-06-21 | Discharge: 2023-06-21 | Disposition: A | Discharge status: Home / Self Care | Attending: Physician Assistant | Admitting: Physician Assistant

## 2023-06-21 ENCOUNTER — Encounter: Payer: Self-pay | Admitting: Emergency Medicine

## 2023-06-21 DIAGNOSIS — B349 Viral infection, unspecified: Secondary | ICD-10-CM | POA: Diagnosis not present

## 2023-06-21 LAB — POCT INFLUENZA A/B
Influenza A, POC: NEGATIVE
Influenza B, POC: NEGATIVE

## 2023-06-21 LAB — POC SARS CORONAVIRUS 2 AG -  ED: SARS Coronavirus 2 Ag: NEGATIVE

## 2023-06-21 NOTE — ED Provider Notes (Signed)
 [facility]    CSN: [id number] Arrival date & time: 06/21/23  1408      History   Chief Complaint Chief Complaint  Patient presents with   Emesis   Generalized Body Aches   Diarrhea    HPI [name] [name] is a 38 y.o. female.   Patient presents today for evaluation of bodyaches, vomiting, diarrhea that started 4 days ago.  She reports that the body aches are bothering her the most.  She has had some congestion.  She denies any shortness of breath or dizziness.  She did note fever initially but this is resolved.  The history is provided by the patient.    Past Medical History:  Diagnosis Date   Admission for sterilization 06/12/2013   Asthma    Breast abscess    Depression    HX of - not on med n, doing well.   Eczema    Family history of anesthesia complication    Grandmother has difficulty waskin up.   Hypertension    Pneumonia    HX only -years ago has had a few times.   Pregnant state, incidental    S/P tubal ligation 06/13/2013   SVD (spontaneous vaginal delivery)    x 2    Patient Active Problem List   Diagnosis Date Noted   Primary hypertension 06/15/2022   Other allergic rhinitis 02/14/2022   Vapes nicotine  containing substance 02/14/2022   Weight gain 04/04/2021   Women's annual routine gynecological examination 06/10/2020   Screening examination for STD (sexually transmitted disease) 06/10/2020   Former tobacco use 12/07/2014   Major depressive disorder, recurrent episode, moderate (HCC) 01/13/2014   GAD (generalized anxiety disorder) 01/13/2014   S/P tubal ligation 06/13/2013   Conjunctivitis 10/14/2012   Asthma 03/01/2006   Intrinsic atopic dermatitis 03/01/2006    Past Surgical History:  Procedure Laterality Date   INCISE AND DRAIN ABCESS     left breast x 2   INCISION AND DRAINAGE ABSCESS Left 02/17/2012   Procedure:  DRAINAGEof recurrent breast abscess;  Surgeon: Rogena Class, MD;  Location: [facility] OR;  Service:  General;  Laterality: Left;   LAPAROSCOPIC TUBAL LIGATION Bilateral 06/13/2013   Procedure: LAPAROSCOPIC TUBAL LIGATION;  Surgeon: Margaretmary Shaver, MD;  Location: [facility];  Service: Gynecology;  Laterality: Bilateral;   TUBAL LIGATION      OB History     Gravida  2   Para  2   Term  1   Preterm  1   AB      Living  2      SAB      IAB      Ectopic      Multiple      Live Births  2            Home Medications    Prior to Admission medications   Medication Sig Start Date End Date Taking? Authorizing Provider  albuterol  (VENTOLIN  HFA) 108 (90 Base) MCG/ACT inhaler TAKE 2 PUFFS BY MOUTH EVERY 6 HOURS AS NEEDED FOR WHEEZE OR SHORTNESS OF BREATH 04/05/23   Newlin, Enobong, MD  amLODipine  (NORVASC ) 5 MG tablet Take 1 tablet (5 mg total) by mouth daily. 01/09/23   Fleming, Zelda W, NP  cetirizine  (ZYRTEC ) 10 MG tablet Take 1 tablet (10 mg total) by mouth daily as needed for allergies. 02/09/23   Orelia Binet, MD  clobetasol  ointment (TEMOVATE ) 0.05 % Apply topically twice daily to BODY as needed for SEVERE red, sandpaper and  thickened like rash.  Do not use on face, groin or armpits.  Use for up to two weeks at a time. 02/09/23   Orelia Binet, MD  fluticasone  (FLONASE ) 50 MCG/ACT nasal spray Place 1 spray into both nostrils 2 (two) times daily as needed for allergies or rhinitis. 02/09/23   Orelia Binet, MD  hydrocortisone  2.5 % ointment Apply topically 2 (two) times daily as needed (okay to use on the face/neck. Don't use more than 7 days in a row.). 03/28/23   Trudy Fusi, DO  hydrOXYzine  (ATARAX ) 50 MG tablet Take 1 tablet (50 mg total) by mouth every 8 (eight) hours as needed. 02/09/23   Orelia Binet, MD  levocetirizine (XYZAL ) 5 MG tablet TAKE 1 TABLET BY MOUTH EVERY DAY IN THE EVENING 02/05/23   Orelia Binet, MD  meloxicam  (MOBIC ) 7.5 MG tablet Take 1 tablet (7.5 mg total) by mouth daily. 01/09/23   Fleming, Zelda W, NP  naproxen  (NAPROSYN ) 375 MG tablet Take 1  tablet (375 mg total) by mouth 2 (two) times daily. 05/01/23   Buena Carmine, NP  NEMLUVIO 30 MG SQ injection Inject into the skin. 04/25/23   [provider]  ondansetron  (ZOFRAN ) 4 MG tablet Take 1 tablet (4 mg total) by mouth every 8 (eight) hours as needed for nausea or vomiting. 05/01/23   Buena Carmine, NP  ondansetron  (ZOFRAN -ODT) 4 MG disintegrating tablet Take 1 tablet (4 mg total) by mouth every 8 (eight) hours as needed for nausea or vomiting. 03/30/23   Ann Keto, MD  PARoxetine  (PAXIL ) 40 MG tablet Take 1 tablet (40 mg total) by mouth daily. 02/28/23   Newlin, Enobong, MD  Tapinarof  (VTAMA ) 1 % CREA Apply 1 Application topically daily as needed (eczema spots). 03/28/23   Trudy Fusi, DO  triamcinolone  ointment (KENALOG ) 0.1 % Apply 1 Application topically 2 (two) times daily as needed (rash flare). Do not use on the face, neck, armpits or groin area. Do not use more than 3 weeks in a row. 03/28/23   Trudy Fusi, DO  Vitamin D , Ergocalciferol , (DRISDOL ) 1.25 MG (50000 UNIT) CAPS capsule Take 1 capsule (50,000 Units total) by mouth every 7 (seven) days. 06/15/22   Vernell Goldsmith, MD  famotidine  (PEPCID ) 20 MG tablet Take 1 tablet (20 mg total) by mouth 2 (two) times daily. 01/03/18 06/26/19  Fawze, Mina A, PA-C  gabapentin  (NEURONTIN ) 100 MG capsule Take 1 capsule (100 mg total) by mouth 3 (three) times daily. 11/21/18 06/26/19  Debbra Fairy, PA-C    Family History Family History  Problem Relation Age of Onset   Healthy Mother    Anxiety disorder Mother    Healthy Father    Stroke Maternal Grandmother    Stroke Paternal Grandfather    Bipolar disorder Neg Hx    Depression Neg Hx     Social History Social History   Tobacco Use   Smoking status: Former    Current packs/day: 0.00    Average packs/day: 1 pack/day for 5.0 years (5.0 ttl pk-yrs)    Types: Cigarettes    Start date: 2016    Quit date: 2021    Years since quitting: 4.4    Passive exposure: Past    Smokeless tobacco: Never  Vaping Use   Vaping status: Every Day   Substances: Nicotine , Flavoring  Substance Use Topics   Alcohol  use: Yes    Comment: occasional   Drug use: No     Allergies  Doxycycline    Review of Systems Review of Systems  Constitutional:  Negative for chills and fever.  HENT:  Positive for congestion and sore throat. Negative for ear pain.   Eyes:  Negative for discharge and redness.  Respiratory:  Positive for cough. Negative for shortness of breath and wheezing.   Gastrointestinal:  Positive for diarrhea, nausea and vomiting. Negative for abdominal pain.     Physical Exam Triage Vital Signs ED Triage Vitals  Encounter Vitals Group     BP      Girls Systolic BP Percentile      Girls Diastolic BP Percentile      Boys Systolic BP Percentile      Boys Diastolic BP Percentile      Pulse      Resp      Temp      Temp src      SpO2      Weight      Height      Head Circumference      Peak Flow      Pain Score      Pain Loc      Pain Education      Exclude from Growth Chart    No data found.  Updated Vital Signs BP 121/88 (BP Location: Left Arm)   Pulse 86   Temp 98.2 F (36.8 C) (Oral)   Resp 18   Wt 225 lb 1.4 oz (102.1 kg)   LMP  (LMP Unknown)   SpO2 97%   BMI 42.88 kg/m   Visual Acuity Right Eye Distance:   Left Eye Distance:   Bilateral Distance:    Right Eye Near:   Left Eye Near:    Bilateral Near:     Physical Exam Vitals and nursing note reviewed.  Constitutional:      General: She is not in acute distress.    Appearance: Normal appearance. She is not ill-appearing.  HENT:     Head: Normocephalic and atraumatic.     Nose: Congestion present.     Mouth/Throat:     Mouth: Mucous membranes are moist.     Pharynx: No oropharyngeal exudate or posterior oropharyngeal erythema.   Eyes:     Conjunctiva/sclera: Conjunctivae normal.    Cardiovascular:     Rate and Rhythm: Normal rate and regular rhythm.      Heart sounds: Normal heart sounds. No murmur heard. Pulmonary:     Effort: Pulmonary effort is normal. No respiratory distress.     Breath sounds: Normal breath sounds. No wheezing, rhonchi or rales.   Skin:    General: Skin is warm and dry.   Neurological:     Mental Status: She is alert.   Psychiatric:        Mood and Affect: Mood normal.        Thought Content: Thought content normal.      UC Treatments / Results  Labs (all labs ordered are listed, but only abnormal results are displayed) Labs Reviewed  POCT INFLUENZA A/B - Normal  POC SARS CORONAVIRUS 2 AG -  ED - Normal    EKG   Radiology No results found.  Procedures Procedures (including critical care time)  Medications Ordered in UC Medications - No data to display  Initial Impression / Assessment and Plan / UC Course  I have reviewed the triage vital signs and the nursing notes.  Pertinent labs & imaging results that were available during my care of the patient were  reviewed by me and considered in my medical decision making (see chart for details).    Rapid COVID and flu screening negative.  Suspect other viral etiology of symptoms and recommended continued monitoring, symptomatic treatment, increase fluids and rest.  Advised follow-up if no gradual improvement or with any further concerns.  Final Clinical Impressions(s) / UC Diagnoses   Final diagnoses:  Viral syndrome   Discharge Instructions   None    ED Prescriptions   None    PDMP not reviewed this encounter.   Vernestine Gondola, PA-C 06/21/23 1517

## 2023-06-21 NOTE — ED Triage Notes (Signed)
 Pt presents c/o the sxs listed x 4 days. Pt reports the emesis and diarrhea started on Monday and Tuesday the body aches started. Pt denies SOB and dizziness.

## 2023-07-11 ENCOUNTER — Telehealth: Payer: Self-pay | Admitting: Critical Care Medicine

## 2023-07-11 NOTE — Telephone Encounter (Signed)
 Called pt to confirm appt. Pt's phone was having issues but did communicate appt details with pt.

## 2023-07-13 ENCOUNTER — Ambulatory Visit: Admitting: Nurse Practitioner

## 2023-08-27 ENCOUNTER — Ambulatory Visit: Admitting: Podiatry

## 2023-08-29 ENCOUNTER — Ambulatory Visit (INDEPENDENT_AMBULATORY_CARE_PROVIDER_SITE_OTHER)

## 2023-08-29 ENCOUNTER — Ambulatory Visit (INDEPENDENT_AMBULATORY_CARE_PROVIDER_SITE_OTHER): Admitting: Podiatry

## 2023-08-29 ENCOUNTER — Encounter: Payer: Self-pay | Admitting: Podiatry

## 2023-08-29 VITALS — Wt 225.0 lb

## 2023-08-29 DIAGNOSIS — M7662 Achilles tendinitis, left leg: Secondary | ICD-10-CM

## 2023-08-29 NOTE — Progress Notes (Signed)
 Subjective:   Patient ID: [name] [name], female   DOB: 38 y.o.   MRN: [id number]   HPI Patient states she is still having severe pain in her left Achilles tendon at insertion and the treatments done so far have not been successful and it has been going on for around 10 months   ROS      Objective:  Physical Exam  Neurovascular status intact with continued inflammation at the insertion of the Achilles left with significant equinus condition     Assessment:  Achilles tendinitis left with chronic inflammation at insertion with equinus condition     Plan:  H&P reviewed x-rays taken reviewed discussed that this may ultimately require surgery and reviewed with another physician with consideration for gastroc recession and possible removal of partial bone posterior left heel.  At this point we are ordering an MRI to better understand the posterior pathology that is present and this is scheduled for her explained to her and she will be seen back when we get results  X-rays were difficult to detect whether or not there may be a bonus its 2-dimensional and hard to make a complete determination

## 2023-09-07 ENCOUNTER — Ambulatory Visit

## 2023-09-07 DIAGNOSIS — L309 Dermatitis, unspecified: Secondary | ICD-10-CM | POA: Diagnosis not present

## 2023-09-07 MED ORDER — DUPILUMAB 300 MG/2ML ~~LOC~~ SOSY
300.0000 mg | PREFILLED_SYRINGE | Freq: Once | SUBCUTANEOUS | Status: AC
  Administered 2023-09-07: 300 mg via SUBCUTANEOUS

## 2023-09-07 NOTE — Progress Notes (Signed)
 Patient came in today because there was some issues with defective devices with nemluvio. Dr. Lorin authorized sample of dupixent  to be given since she has been so long without medication. Manufactured has been notified of the issues. She will reach back out to follow up if she has not heard anything from us  or from the manufacturer in 4 weeks as directed.

## 2023-09-13 ENCOUNTER — Telehealth: Payer: Self-pay | Admitting: Podiatry

## 2023-09-13 NOTE — Telephone Encounter (Signed)
 That was faxed over yesterday.

## 2023-09-13 NOTE — Telephone Encounter (Signed)
 Hi, my name is Leotis and I'm calling on behalf of patient [name] [name], DOB: Jul 25, 1985. We're just trying to obtain a copy of the MRI order so we can get her scheduled for that. If you could get that faxed over to us  at [phone]. Or if you have any questions, our phone number is [phone]

## 2023-09-19 ENCOUNTER — Ambulatory Visit: Payer: Worker's Compensation

## 2023-09-19 DIAGNOSIS — M7662 Achilles tendinitis, left leg: Secondary | ICD-10-CM

## 2023-09-19 DIAGNOSIS — M62462 Contracture of muscle, left lower leg: Secondary | ICD-10-CM | POA: Diagnosis not present

## 2023-09-19 DIAGNOSIS — M7732 Calcaneal spur, left foot: Secondary | ICD-10-CM | POA: Diagnosis not present

## 2023-09-19 NOTE — Progress Notes (Signed)
 Subjective:  Patient ID: [name] [name], female    DOB: Sep 12, 1985,  MRN: [id number]  Chief Complaint  Patient presents with   Foot Pain    Follow up achilles tendonitis left - states no improvement, MRI done on Saturday-has report    38 y.o. female presents with the above complaint. Case worker here with her.  Patient has had pain to her left Achilles tendon at the insertion for multiple months since she reported a injury at work that occurred in April.  This involved falling down 2 flights of stairs.  She has attempted conservative treatments including immobilization, injections.  These have not been helpful.  Review of Systems: Negative except as noted in the HPI. Denies N/V/F/Ch.  Past Medical History:  Diagnosis Date   Admission for sterilization 06/12/2013   Asthma    Breast abscess    Depression    HX of - not on med n, doing well.   Eczema    Family history of anesthesia complication    Grandmother has difficulty waskin up.   Hypertension    Pneumonia    HX only -years ago has had a few times.   Pregnant state, incidental    S/P tubal ligation 06/13/2013   SVD (spontaneous vaginal delivery)    x 2    Current Outpatient Medications:    albuterol  (VENTOLIN  HFA) 108 (90 Base) MCG/ACT inhaler, TAKE 2 PUFFS BY MOUTH EVERY 6 HOURS AS NEEDED FOR WHEEZE OR SHORTNESS OF BREATH, Disp: 25.5 g, Rfl: 1   cetirizine  (ZYRTEC ) 10 MG tablet, Take 1 tablet (10 mg total) by mouth daily as needed for allergies., Disp: 30 tablet, Rfl: 5   clobetasol  ointment (TEMOVATE ) 0.05 %, Apply topically twice daily to BODY as needed for SEVERE red, sandpaper and thickened like rash.  Do not use on face, groin or armpits.  Use for up to two weeks at a time., Disp: 60 g, Rfl: 3   hydrocortisone  2.5 % ointment, Apply topically 2 (two) times daily as needed (okay to use on the face/neck. Don't use more than 7 days in a row.)., Disp: 30 g, Rfl: 2   hydrOXYzine  (ATARAX ) 50 MG tablet, Take 1 tablet (50  mg total) by mouth every 8 (eight) hours as needed., Disp: 270 tablet, Rfl: 1   NEMLUVIO 30 MG SQ injection, Inject into the skin., Disp: , Rfl:    triamcinolone  ointment (KENALOG ) 0.1 %, Apply 1 Application topically 2 (two) times daily as needed (rash flare). Do not use on the face, neck, armpits or groin area. Do not use more than 3 weeks in a row., Disp: 30 g, Rfl: 2   Vitamin D , Ergocalciferol , (DRISDOL ) 1.25 MG (50000 UNIT) CAPS capsule, Take 1 capsule (50,000 Units total) by mouth every 7 (seven) days., Disp: 16 capsule, Rfl: 2  Social History   Tobacco Use  Smoking Status Former   Current packs/day: 0.00   Average packs/day: 1 pack/day for 5.0 years (5.0 ttl pk-yrs)   Types: Cigarettes   Start date: 2016   Quit date: 2021   Years since quitting: 4.7   Passive exposure: Past  Smokeless Tobacco Never    Allergies  Allergen Reactions   Doxycycline  Photosensitivity   Objective:  There were no vitals filed for this visit. There is no height or weight on file to calculate BMI. Constitutional Well developed. Well nourished. Oriented to person, place, and time.  Vascular Dorsalis pedis pulses palpable bilaterally. Posterior tibial pulses palpable bilaterally. Capillary refill normal to all  digits.  No cyanosis or clubbing noted. Pedal hair growth normal.  Neurologic Normal speech. Epicritic sensation to light touch grossly present bilaterally. Negative tinel sign at tarsal tunnel bilaterally.   Dermatologic Skin texture and turgor are within normal limits.  No open wounds. No skin lesions.  Musculoskeletal: 5 out of 5 muscle strength to all major pedal muscle groups.  Rectus foot shape.  Significant pain to palpation at insertion of Achilles tendon.  No pain to palpation at the mid substance.  She does have gastrocnemius equinus with ankle unable to get to 90 with knee extended, able to reach 90 with knee flexed.   Radiographs: Previously taken x-rays of the left foot were  reviewed today.  These do demonstrate enlarged posterior superior aspect of the calcaneus.  There is no frank spurring of the posterior heel.  MRI report from outside facility was reviewed today.  Impression: 1.  Mild tendinosis of the Achilles tendon without tear 2.  Mild retrocalcaneal bursitis 3.  Short segment split tear peroneus brevis/longus 4.  Mild tenosynovitis of tibialis posterior 5.  Chronic scarring and attenuation of the ATFL  Assessment:   1. Exostosis of left posterior calcaneus   2. Gastrocnemius equinus, left   3. Achilles tendinitis, left leg    Plan:  - Patient was evaluated and treated and all questions answered.  Achilles tendinitis, Haglund's deformity, gastrocnemius equinus of the left -Discussed with patient conservative versus surgical treatment options, at this point the patient has failed conservative treatment.  She agrees with this and has been dealing with the pain for over 5 months.  She is interested in surgical intervention.   - Discussed postoperative recovery with patient and caseworker.  Patient will be out of work for at least 10 to 12 weeks pending recovery before she is okay to ambulate in standard shoe gear.  - Based on her pathology and previous imaging, recommend gastrocnemius recession, secondary Achilles repair with reattachment, calcaneal ostectomy.  Consider PT after skin heals.   Informed surgical risk consent was reviewed and read aloud to the patient.  I reviewed the imaging.  I have discussed my findings with the patient in great detail.  I have discussed all risks including but not limited to infection, stiffness, scarring, limp, disability, deformity, damage to blood vessels and nerves, numbness, poor healing, need for braces, arthritis, chronic pain, amputation, and death.  All benefits and realistic expectations discussed in great detail.  I have made no promises as to the outcome.  I have provided realistic expectations.  I have offered the  patient a 2nd opinion, which they have declined and assured me they preferred to proceed despite the risks.  Planned procedures: Left Gastrocnemius recession, secondary Achilles repair with reattachment, calcaneal ostectomy  Post-operative weight bearing status: NWB in post-operative splint.   VTE risk assessment/need for prophylaxis:  Aspirin 81mg  BID x 21 days or until weightbearing   Post-operative pain management: Oxycodone /acetaminophen  5/325  Patient risk factors that were discussed: nicotine  smoking, cessation was discussed  Follow-up after surgery  Prentice Ovens, DPM AACFAS [facility] Trained Podiatric Surgeon [facility]

## 2023-09-19 NOTE — Patient Instructions (Signed)
 Following a work injury in April, patient has developed Achilles tendinosis with insertional pain.  She has attempted all conservative treatments and failed these treatments.  We have discussed and we will proceed with surgical intervention to address the pathology.  This will likely keep her out of work for 8 to 12 weeks following the surgery due to an inability to ambulate as she recovers.  Her maximal improvement is estimated at 6 to 12 months following surgery

## 2023-09-20 ENCOUNTER — Encounter: Payer: Self-pay | Admitting: Podiatry

## 2023-09-26 ENCOUNTER — Telehealth: Payer: Self-pay

## 2023-09-26 NOTE — Telephone Encounter (Signed)
 Called pt to get scheduled for surgery and she is stating its thru workmans comp and they are wanting her to get a second opinion.   She will call back when she is able to get surgery scheduled thru workmans comp

## 2023-10-08 ENCOUNTER — Other Ambulatory Visit: Payer: Self-pay | Admitting: *Deleted

## 2023-10-08 MED ORDER — NEMLUVIO 30 MG ~~LOC~~ AUIJ: 30.0000 mg | AUTO-INJECTOR | SUBCUTANEOUS | 0 refills | Status: DC

## 2023-10-08 NOTE — Telephone Encounter (Signed)
 Galderma request rx to [facility] due to pen failure X 2 for replacement doses

## 2023-10-12 ENCOUNTER — Other Ambulatory Visit: Payer: Self-pay | Admitting: Critical Care Medicine

## 2023-10-12 DIAGNOSIS — E559 Vitamin D deficiency, unspecified: Secondary | ICD-10-CM

## 2023-10-30 ENCOUNTER — Encounter: Payer: Self-pay | Admitting: Emergency Medicine

## 2023-10-30 ENCOUNTER — Ambulatory Visit: Admission: EM | Admit: 2023-10-30 | Discharge: 2023-10-30 | Disposition: A | Discharge status: Home / Self Care

## 2023-10-30 DIAGNOSIS — R5383 Other fatigue: Secondary | ICD-10-CM | POA: Diagnosis not present

## 2023-10-30 DIAGNOSIS — J111 Influenza due to unidentified influenza virus with other respiratory manifestations: Secondary | ICD-10-CM

## 2023-10-30 DIAGNOSIS — R52 Pain, unspecified: Secondary | ICD-10-CM

## 2023-10-30 MED ORDER — CYCLOBENZAPRINE HCL 10 MG PO TABS: 10.0000 mg | ORAL_TABLET | Freq: Every evening | ORAL | 0 refills | Status: DC

## 2023-10-30 NOTE — ED Triage Notes (Signed)
 Pt presents c/o URI x 6 days. Pt states,  Ever since last 11-08-23 I have felt like death. I took a home test last Friday and tested positive for Flu A/B. I've used fluids, rest and Ibuprofen  to control the sxs but the sxs are not improving. I couldn't even make it to work yesterday.  Pt denies emesis and diarrhea.

## 2023-10-30 NOTE — Discharge Instructions (Signed)
 Try the muscle relaxer flexeril  at bedtime. Be cautious as this may make you drowsy. Alternate ibuprofen  and tylenol  every 4 hours for body aches Drink LOTS of water and fluids to stay hydrated Allow 3-4 more days for noticeable improvement. Please return if needed

## 2023-10-30 NOTE — ED Provider Notes (Signed)
 [facility]    CSN: [id number] Arrival date & time: 10/30/23  1641      History   Chief Complaint Chief Complaint  Patient presents with   URI    HPI [name] [name] is a 38 y.o. female.  Symptoms started 6 days ago on Wednesday 10/22 Tactile fever, body aches, nasal congestion, cough  Tested positive for both flu A and flu B on Friday 10/24 The body aches and fatigue have persisted   No further fevers No chest pain, tightness, shortness of breath, wheezing  Not having abdominal pain, nausea/vomiting, diarrhea, rash, sore throat   Has used ibuprofen    Past Medical History:  Diagnosis Date   Admission for sterilization 06/12/2013   Asthma    Breast abscess    Depression    HX of - not on med n, doing well.   Eczema    Family history of anesthesia complication    Grandmother has difficulty waskin up.   Hypertension    Pneumonia    HX only -years ago has had a few times.   Pregnant state, incidental    S/P tubal ligation 06/13/2013   SVD (spontaneous vaginal delivery)    x 2    Patient Active Problem List   Diagnosis Date Noted   Acute ankle pain 04/26/2023   Pain in right foot 04/26/2023   Other allergic rhinitis 02/14/2022   Vapes nicotine  containing substance 02/14/2022   Weight gain 04/04/2021   Former tobacco use 12/07/2014   Major depressive disorder, recurrent episode, moderate (HCC) 01/13/2014   GAD (generalized anxiety disorder) 01/13/2014   S/P tubal ligation 06/13/2013   Asthma 03/01/2006   Intrinsic atopic dermatitis 03/01/2006    Past Surgical History:  Procedure Laterality Date   INCISE AND DRAIN ABCESS     left breast x 2   INCISION AND DRAINAGE ABSCESS Left 02/17/2012   Procedure:  DRAINAGEof recurrent breast abscess;  Surgeon: Vicenta DELENA Poli, MD;  Location: [facility];  Service: General;  Laterality: Left;   LAPAROSCOPIC TUBAL LIGATION Bilateral 06/13/2013   Procedure: LAPAROSCOPIC TUBAL LIGATION;  Surgeon: Ezzie Buba, MD;  Location: [facility];  Service: Gynecology;  Laterality: Bilateral;   TUBAL LIGATION      OB History     Gravida  2   Para  2   Term  1   Preterm  1   AB      Living  2      SAB      IAB      Ectopic      Multiple      Live Births  2            Home Medications    Prior to Admission medications   Medication Sig Start Date End Date Taking? Authorizing Provider  azithromycin  (ZITHROMAX ) 250 MG tablet Oral 01/16/14  Yes [provider]  cyclobenzaprine  (FLEXERIL ) 10 MG tablet Take 1 tablet (10 mg total) by mouth at bedtime. 10/30/23  Yes Jenavie Stanczak, [place], PA-C  FLUoxetine  (PROZAC ) 20 MG capsule Oral 01/16/14  Yes [provider]  HYDROcodone -acetaminophen  (NORCO/VICODIN) 5-325 MG tablet Oral 01/16/14  Yes [provider]  hydrOXYzine  (VISTARIL ) 25 MG capsule Oral 01/16/14  Yes [provider]  ibuprofen  (ADVIL ) 800 MG tablet Oral 01/16/14  Yes [provider]  loratadine  (CLARITIN ) 10 MG tablet Oral 01/16/14  Yes [provider]  metroNIDAZOLE (FLAGYL) 500 MG tablet Oral 01/16/14  Yes [provider]  mometasone -formoterol  (DULERA ) 200-5  MCG/ACT AERO Inhalation 01/16/14  Yes [provider]  montelukast  (SINGULAIR ) 10 MG tablet Oral 01/16/14  Yes [provider]  oxyCODONE -acetaminophen  (PERCOCET/ROXICET) 5-325 MG tablet Oral 01/16/14  Yes [provider]  albuterol  (VENTOLIN  HFA) 108 (90 Base) MCG/ACT inhaler TAKE 2 PUFFS BY MOUTH EVERY 6 HOURS AS NEEDED FOR WHEEZE OR SHORTNESS OF BREATH 04/05/23   Newlin, Enobong, MD  cetirizine  (ZYRTEC ) 10 MG tablet Take 1 tablet (10 mg total) by mouth daily as needed for allergies. 02/09/23   Lorin Norris, MD  clobetasol  ointment (TEMOVATE ) 0.05 % Apply topically twice daily to BODY as needed for SEVERE red, sandpaper and thickened like rash.  Do not use on face, groin or armpits.  Use for up to two weeks at a time. 02/09/23   Lorin Norris, MD  hydrocortisone  2.5 % ointment Apply topically 2 (two) times daily as needed (okay to use on the face/neck. Don't use more than 7 days in a row.). 03/28/23   Luke Orlan HERO, DO  hydrOXYzine  (ATARAX ) 50 MG tablet Take 1 tablet (50 mg total) by mouth every 8 (eight) hours as needed. 02/09/23   Lorin Norris, MD  NEMLUVIO 30 MG SQ injection Inject 30 mg into the skin every 28 (twenty-eight) days. 10/08/23   Luke Orlan HERO, DO  triamcinolone  ointment (KENALOG ) 0.1 % Apply 1 Application topically 2 (two) times daily as needed (rash flare). Do not use on the face, neck, armpits or groin area. Do not use more than 3 weeks in a row. 03/28/23   Luke Orlan HERO, DO  Vitamin D , Ergocalciferol , (DRISDOL ) 1.25 MG (50000 UNIT) CAPS capsule Take 1 capsule (50,000 Units total) by mouth every 7 (seven) days. 06/15/22   Brien Belvie BRAVO, MD  famotidine  (PEPCID ) 20 MG tablet Take 1 tablet (20 mg total) by mouth 2 (two) times daily. 01/03/18 06/26/19  Meryle Ip A, PA-C  gabapentin  (NEURONTIN ) 100 MG capsule Take 1 capsule (100 mg total) by mouth 3 (three) times daily. 11/21/18 06/26/19  Nivia Colon, PA-C    Family History Family History  Problem Relation Age of Onset   Healthy Mother    Anxiety disorder Mother    Healthy Father    Stroke Maternal Grandmother    Stroke Paternal Grandfather    Bipolar disorder Neg Hx    Depression Neg Hx     Social History Social History   Tobacco Use   Smoking status: Former    Current packs/day: 0.00    Average packs/day: 1 pack/day for 5.0 years (5.0 ttl pk-yrs)    Types: Cigarettes    Start date: 2016    Quit date: 2021    Years since quitting: 4.8    Passive exposure: Past   Smokeless tobacco: Never  Vaping Use   Vaping status: Every Day   Substances: Nicotine , Flavoring  Substance Use Topics   Alcohol  use: Yes    Comment: occasional   Drug use: No     Allergies   Doxycycline    Review of Systems Review of Systems As per HPI  Physical Exam Triage  Vital Signs ED Triage Vitals  Encounter Vitals Group     BP 10/30/23 1727 (!) 142/83     Girls Systolic BP Percentile --      Girls Diastolic BP Percentile --      Boys Systolic BP Percentile --      Boys Diastolic BP Percentile --      Pulse Rate 10/30/23 1727 (!) 102  Resp 10/30/23 1727 18     Temp 10/30/23 1727 98.3 F (36.8 C)     Temp Source 10/30/23 1727 Oral     SpO2 10/30/23 1727 96 %     Weight 10/30/23 1727 225 lb 1.4 oz (102.1 kg)     Height --      Head Circumference --      Peak Flow --      Pain Score 10/30/23 1726 10     Pain Loc --      Pain Education --      Exclude from Growth Chart --    No data found.  Updated Vital Signs BP (!) 142/83 (BP Location: Left Arm)   Pulse (!) 102   Temp 98.3 F (36.8 C) (Oral)   Resp 18   Wt 225 lb 1.4 oz (102.1 kg)   SpO2 96%   BMI 42.88 kg/m   HR 92  Physical Exam Vitals and nursing note reviewed.  Constitutional:      Appearance: She is not ill-appearing.  HENT:     Right Ear: Tympanic membrane and ear canal normal.     Left Ear: Tympanic membrane and ear canal normal.     Nose: No congestion or rhinorrhea.     Mouth/Throat:     Mouth: Mucous membranes are moist.     Pharynx: Oropharynx is clear. No posterior oropharyngeal erythema.  Eyes:     Conjunctiva/sclera: Conjunctivae normal.  Cardiovascular:     Rate and Rhythm: Normal rate and regular rhythm.     Pulses: Normal pulses.     Heart sounds: Normal heart sounds.  Pulmonary:     Effort: Pulmonary effort is normal. No respiratory distress.     Breath sounds: Normal breath sounds. No wheezing or rales.  Abdominal:     Palpations: Abdomen is soft.     Tenderness: There is no abdominal tenderness.  Musculoskeletal:     Cervical back: Normal range of motion.  Lymphadenopathy:     Cervical: No cervical adenopathy.  Skin:    General: Skin is warm and dry.  Neurological:     Mental Status: She is alert and oriented to person, place, and time.       UC Treatments / Results  Labs (all labs ordered are listed, but only abnormal results are displayed) Labs Reviewed - No data to display  EKG  Radiology No results found.  Procedures Procedures   Medications Ordered in UC Medications - No data to display  Initial Impression / Assessment and Plan / UC Course  I have reviewed the triage vital signs and the nursing notes.  Pertinent labs & imaging results that were available during my care of the patient were reviewed by me and considered in my medical decision making (see chart for details).  6 days into influenza A and B Body aches and fatigue are main symptoms Discussed continuing supportive care for several more days. Alternate ibu and tylenol , try flexeril  at bedtime, importance of increased fluids A note for work is provided Return precautions   Had tizanidine  in the past that made her feel unsafe  Final Clinical Impressions(s) / UC Diagnoses   Final diagnoses:  Body aches  Other fatigue  Influenza     Discharge Instructions      Try the muscle relaxer flexeril  at bedtime. Be cautious as this may make you drowsy. Alternate ibuprofen  and tylenol  every 4 hours for body aches Drink LOTS of water and fluids to stay hydrated  Allow 3-4 more days for noticeable improvement. Please return if needed    ED Prescriptions     Medication Sig Dispense Auth. Provider   cyclobenzaprine  (FLEXERIL ) 10 MG tablet Take 1 tablet (10 mg total) by mouth at bedtime. 10 tablet Jeremi Losito, [place], PA-C      PDMP not reviewed this encounter.   Jeryl [place]berry, [place] 10/30/23 [id number]

## 2023-11-05 ENCOUNTER — Ambulatory Visit: Admission: EM | Admit: 2023-11-05 | Discharge: 2023-11-05 | Disposition: A | Discharge status: Home / Self Care

## 2023-11-05 ENCOUNTER — Encounter: Payer: Self-pay | Admitting: Emergency Medicine

## 2023-11-05 DIAGNOSIS — L239 Allergic contact dermatitis, unspecified cause: Secondary | ICD-10-CM | POA: Diagnosis not present

## 2023-11-05 MED ORDER — CETIRIZINE HCL 10 MG PO TABS: 10.0000 mg | ORAL_TABLET | Freq: Every day | ORAL | 0 refills | Status: DC

## 2023-11-05 MED ORDER — DEXAMETHASONE SOD PHOSPHATE PF 10 MG/ML IJ SOLN
10.0000 mg | Freq: Once | INTRAMUSCULAR | Status: AC
  Administered 2023-11-05: 10 mg via INTRAMUSCULAR

## 2023-11-05 MED ORDER — CETIRIZINE HCL 10 MG PO TABS
10.0000 mg | ORAL_TABLET | Freq: Every day | ORAL | Status: DC
  Administered 2023-11-05: 10 mg via ORAL

## 2023-11-05 MED ORDER — PREDNISONE 20 MG PO TABS: 40.0000 mg | ORAL_TABLET | Freq: Every day | ORAL | 0 refills | Status: AC

## 2023-11-05 NOTE — ED Provider Notes (Signed)
 [facility]  Note:  This document was prepared using Conservation officer, historic buildings and may include unintentional dictation errors.  MRN: [id number] DOB: 1985-02-05  Subjective:   [name] [name] is a 38 y.o. female presenting for facial swelling, redness, rash since Friday.  Patient denies any exposure to any chemicals, change in soaps detergents, facial cream, food, medications, plant exposures.  Patient denies any past history of allergic reaction with similar presentation.  Denies any itching or fever.  Has been taking Benadryl  with minimal to no improvement.  Patient reports she did test positive for flu A and flu B 2 weeks ago and was unsure if this may be related symptom.  Patient denies any vision changes or severe pain to the face.  Rash appears around bilateral eyes and cheeks, no respiratory involvement.   Current Facility-Administered Medications:    cetirizine  (ZYRTEC ) tablet 10 mg, 10 mg, Oral, Daily, Aidenjames Heckmann B, NP, 10 mg at 11/05/23 1647  Current Outpatient Medications:    albuterol  (VENTOLIN  HFA) 108 (90 Base) MCG/ACT inhaler, TAKE 2 PUFFS BY MOUTH EVERY 6 HOURS AS NEEDED FOR WHEEZE OR SHORTNESS OF BREATH, Disp: 25.5 g, Rfl: 1   amLODipine  (NORVASC ) 5 MG tablet, Take 5 mg by mouth daily., Disp: , Rfl:    cetirizine  (ZYRTEC ) 10 MG tablet, Take 1 tablet (10 mg total) by mouth daily., Disp: 30 tablet, Rfl: 0   clobetasol  ointment (TEMOVATE ) 0.05 %, Apply topically twice daily to BODY as needed for SEVERE red, sandpaper and thickened like rash.  Do not use on face, groin or armpits.  Use for up to two weeks at a time., Disp: 60 g, Rfl: 3   ibuprofen  (ADVIL ) 800 MG tablet, Oral, Disp: , Rfl:    predniSONE  (DELTASONE ) 20 MG tablet, Take 2 tablets (40 mg total) by mouth daily for 5 days., Disp: 10 tablet, Rfl: 0   albuterol  (PROVENTIL ) (2.5 MG/3ML) 0.083% nebulizer solution, Inhalation (Patient not taking: Reported on 11/05/2023), Disp: , Rfl:     azithromycin  (ZITHROMAX ) 250 MG tablet, Oral (Patient not taking: Reported on 11/05/2023), Disp: , Rfl:    cyclobenzaprine  (FLEXERIL ) 10 MG tablet, Take 1 tablet (10 mg total) by mouth at bedtime. (Patient not taking: Reported on 11/05/2023), Disp: 10 tablet, Rfl: 0   FLUoxetine  (PROZAC ) 20 MG capsule, Oral (Patient not taking: Reported on 11/05/2023), Disp: , Rfl:    HYDROcodone -acetaminophen  (NORCO/VICODIN) 5-325 MG tablet, Oral (Patient not taking: Reported on 11/05/2023), Disp: , Rfl:    hydrocortisone  2.5 % ointment, Apply topically 2 (two) times daily as needed (okay to use on the face/neck. Don't use more than 7 days in a row.). (Patient not taking: Reported on 11/05/2023), Disp: 30 g, Rfl: 2   hydrOXYzine  (ATARAX ) 50 MG tablet, Take 1 tablet (50 mg total) by mouth every 8 (eight) hours as needed. (Patient not taking: Reported on 11/05/2023), Disp: 270 tablet, Rfl: 1   hydrOXYzine  (VISTARIL ) 25 MG capsule, Oral (Patient not taking: Reported on 11/05/2023), Disp: , Rfl:    metroNIDAZOLE (FLAGYL) 500 MG tablet, Oral (Patient not taking: Reported on 11/05/2023), Disp: , Rfl:    mometasone -formoterol  (DULERA ) 200-5 MCG/ACT AERO, Inhalation (Patient not taking: Reported on 11/05/2023), Disp: , Rfl:    montelukast  (SINGULAIR ) 10 MG tablet, Oral (Patient not taking: Reported on 11/05/2023), Disp: , Rfl:    NEMLUVIO 30 MG SQ injection, Inject 30 mg into the skin every 28 (twenty-eight) days., Disp: 2 each, Rfl: 0   oxyCODONE -acetaminophen  (PERCOCET/ROXICET) 5-325 MG tablet, Oral (Patient  not taking: Reported on 11/05/2023), Disp: , Rfl:    terbinafine  (LAMISIL ) 250 MG tablet, Oral (Patient not taking: Reported on 11/05/2023), Disp: , Rfl:    triamcinolone  ointment (KENALOG ) 0.1 %, Apply 1 Application topically 2 (two) times daily as needed (rash flare). Do not use on the face, neck, armpits or groin area. Do not use more than 3 weeks in a row., Disp: 30 g, Rfl: 2   Vitamin D , Ergocalciferol , (DRISDOL ) 1.25 MG  (50000 UNIT) CAPS capsule, Take 1 capsule (50,000 Units total) by mouth every 7 (seven) days. (Patient not taking: Reported on 11/05/2023), Disp: 16 capsule, Rfl: 2   Allergies  Allergen Reactions   Doxycycline  Photosensitivity    Other Reaction(s): Unknown    Past Medical History:  Diagnosis Date   Admission for sterilization 06/12/2013   Asthma    Breast abscess    Depression    HX of - not on med n, doing well.   Eczema    Family history of anesthesia complication    Grandmother has difficulty waskin up.   Hypertension    Pneumonia    HX only -years ago has had a few times.   Pregnant state, incidental    S/P tubal ligation 06/13/2013   SVD (spontaneous vaginal delivery)    x 2     Past Surgical History:  Procedure Laterality Date   INCISE AND DRAIN ABCESS     left breast x 2   INCISION AND DRAINAGE ABSCESS Left 02/17/2012   Procedure:  DRAINAGEof recurrent breast abscess;  Surgeon: Vicenta DELENA Poli, MD;  Location: [facility];  Service: General;  Laterality: Left;   LAPAROSCOPIC TUBAL LIGATION Bilateral 06/13/2013   Procedure: LAPAROSCOPIC TUBAL LIGATION;  Surgeon: Ezzie Buba, MD;  Location: [facility];  Service: Gynecology;  Laterality: Bilateral;   TUBAL LIGATION      Family History  Problem Relation Age of Onset   Healthy Mother    Anxiety disorder Mother    Healthy Father    Stroke Maternal Grandmother    Stroke Paternal Grandfather    Bipolar disorder Neg Hx    Depression Neg Hx     Social History   Tobacco Use   Smoking status: Former    Current packs/day: 0.00    Average packs/day: 1 pack/day for 5.0 years (5.0 ttl pk-yrs)    Types: Cigarettes    Start date: 2016    Quit date: 2021    Years since quitting: 4.8    Passive exposure: Past   Smokeless tobacco: Never  Vaping Use   Vaping status: Every Day   Substances: Nicotine , Flavoring  Substance Use Topics   Alcohol  use: Yes    Comment: occasional   Drug use: No    ROS Refer to HPI for  ROS details.  Objective:   Vitals: BP 139/87 (BP Location: Left Arm)   Pulse (!) 110   Temp 98.2 F (36.8 C) (Oral)   Resp 17   SpO2 95%   Physical Exam Vitals and nursing note reviewed.  Constitutional:      General: She is not in acute distress.    Appearance: Normal appearance. She is not ill-appearing.  HENT:     Head: Normocephalic.   Cardiovascular:     Rate and Rhythm: Normal rate.  Pulmonary:     Effort: Pulmonary effort is normal. No respiratory distress.  Skin:    General: Skin is warm and dry.     Capillary Refill: Capillary refill takes less than  2 seconds.     Findings: Erythema and rash present. Rash is papular and urticarial.  Neurological:     General: No focal deficit present.     Mental Status: She is alert and oriented to person, place, and time.  Psychiatric:        Mood and Affect: Mood normal.        Behavior: Behavior normal.     Procedures  No results found for this or any previous visit (from the past 24 hours).  No results found.   Assessment and Plan :     Discharge Instructions       1. Allergic dermatitis (Primary) - cetirizine  (ZYRTEC ) tablet 10 mg given in UC for acute facial swelling and rash - dexamethasone  (DECADRON ) IM injection 10 mg given in UC for acute facial swelling and rash - cetirizine  (ZYRTEC ) 10 MG tablet; Take 1 tablet (10 mg total) by mouth daily.  Dispense: 30 tablet; Refill: 0 - predniSONE  (DELTASONE ) 20 MG tablet; Take 2 tablets (40 mg total) by mouth daily for 5 days.  Dispense: 10 tablet; Refill: 0 -Continue to monitor symptoms for any change in severity if there is any escalation of current symptoms or development of new symptoms follow-up in ER for further evaluation and management.       Oneal Biglow B Takiesha Mcdevitt   Mourad Cwikla, [place] B, [place] 11/05/23 1656

## 2023-11-05 NOTE — ED Triage Notes (Signed)
 Triage pt briefly in lobby. No breathing concerns or difficulties. Facial swelling with rash since Friday evening. Pulse oximetry 98%.

## 2023-11-05 NOTE — Discharge Instructions (Signed)
  1. Allergic dermatitis (Primary) - cetirizine  (ZYRTEC ) tablet 10 mg given in UC for acute facial swelling and rash - dexamethasone  (DECADRON ) IM injection 10 mg given in UC for acute facial swelling and rash - cetirizine  (ZYRTEC ) 10 MG tablet; Take 1 tablet (10 mg total) by mouth daily.  Dispense: 30 tablet; Refill: 0 - predniSONE  (DELTASONE ) 20 MG tablet; Take 2 tablets (40 mg total) by mouth daily for 5 days.  Dispense: 10 tablet; Refill: 0 -Continue to monitor symptoms for any change in severity if there is any escalation of current symptoms or development of new symptoms follow-up in ER for further evaluation and management.

## 2023-11-05 NOTE — ED Notes (Signed)
 Declined waiting period post-injection due to having other steriods in the past.

## 2023-11-05 NOTE — ED Triage Notes (Signed)
 Pt reports waking up with generalized facial swelling on Friday (10/31) morning. Denies new soaps, detergents, creams, foods, meds, plants, animals, etc. No hx of similar symptoms. Pt reports over the last day her face has developed redness and pressure-like pain. No difficulty breathing. Denies itching and fevers. Has taken benadryl  every day with no relief or changes.   Pt reports she was positive for flu A and flu B 2 weeks ago, unsure if that could be related.

## 2023-12-16 ENCOUNTER — Other Ambulatory Visit: Payer: Self-pay | Admitting: Internal Medicine

## 2023-12-19 ENCOUNTER — Other Ambulatory Visit: Payer: Self-pay | Admitting: Nurse Practitioner

## 2023-12-19 DIAGNOSIS — M79672 Pain in left foot: Secondary | ICD-10-CM

## 2024-01-28 ENCOUNTER — Other Ambulatory Visit: Payer: Self-pay | Admitting: Family Medicine

## 2024-01-29 ENCOUNTER — Telehealth: Payer: Self-pay | Admitting: Critical Care Medicine

## 2024-01-29 NOTE — Telephone Encounter (Signed)
 Pt confirmed appt

## 2024-01-30 ENCOUNTER — Encounter: Payer: Self-pay | Admitting: Nurse Practitioner

## 2024-01-30 ENCOUNTER — Ambulatory Visit: Payer: Self-pay | Attending: Nurse Practitioner | Admitting: Nurse Practitioner

## 2024-01-30 VITALS — BP 157/98 | HR 107 | Ht 60.75 in | Wt 220.2 lb

## 2024-01-30 DIAGNOSIS — R000 Tachycardia, unspecified: Secondary | ICD-10-CM | POA: Diagnosis not present

## 2024-01-30 DIAGNOSIS — I10 Essential (primary) hypertension: Secondary | ICD-10-CM | POA: Diagnosis not present

## 2024-01-30 DIAGNOSIS — R197 Diarrhea, unspecified: Secondary | ICD-10-CM | POA: Diagnosis not present

## 2024-01-30 DIAGNOSIS — R7989 Other specified abnormal findings of blood chemistry: Secondary | ICD-10-CM | POA: Diagnosis not present

## 2024-01-30 DIAGNOSIS — R630 Anorexia: Secondary | ICD-10-CM

## 2024-01-30 MED ORDER — HYDROCHLOROTHIAZIDE 25 MG PO TABS: 25.0000 mg | ORAL_TABLET | Freq: Every day | ORAL | 3 refills | Status: AC

## 2024-01-30 NOTE — Progress Notes (Signed)
 "  Assessment & Plan:  [name] [name] was seen today for hypertension and decreased appetite.  Diagnoses and all orders for this visit:  Primary hypertension START HCTZ -     Thyroid Panel With TSH -     CMP14+EGFR -     hydrochlorothiazide  (HYDRODIURIL ) 25 MG tablet; Take 1 tablet (25 mg total) by mouth daily.  Tachycardia HR 100's on BP monitor EKG showed NSR -     Thyroid Panel With TSH -     CBC with Differential -     EKG 12-Lead  Abnormal CBC -     CBC with Differential  Frequent diarrhea -     Ambulatory referral to Gastroenterology  Decreased appetite -     Ambulatory referral to Gastroenterology    Patient has been counseled on age-appropriate routine health concerns for screening and prevention. These are reviewed and up-to-date. Referrals have been placed accordingly. Immunizations are up-to-date or declined.    Subjective:   Chief Complaint  Patient presents with   Hypertension   Decreased appetite    [name] [name] 39 y.o. female presents to office today for decreased appetite and diarrhea.    HTN Blood pressure is elevated today. She reports similar readings at home. She has a history of hypertension and was previously on amlodipine , which she stopped about four months ago due to side effects such as feeling 'crappy' and irritable.  She denies excessive use of her inhaler and does not consume caffeine regularly.  BP Readings from Last 3 Encounters:  01/30/24 (!) 157/98  11/05/23 139/87  10/30/23 (!) 142/83    She has experienced a significant decrease in appetite since before Thanksgiving, with no prior history of similar issues. She has constant nausea and frequent episodes of dry heaving. If she eats, the food often comes back up, and she is unable to eat a full meal, managing only three or four bites before feeling unable to continue. This has become embarrassing for her in social settings. She has diarrhea, occurring several times a day. 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 Advised to change her diet, but was told there is no definitive way to diagnose IBS. She denies constipation and has no specific dietary triggers to diarrhea such as lactose or gluten.  She mentions a past use of acid reflux medication about a decade ago but has not been on any PPIs or H 2 Antagonists. She is not currently taking any OTC supplements or probiotics for her symptoms.  She has a history of anxiety and depression, which she feels are currently under control. She denies taking any medication for anxiety at present.    Review of Systems  Constitutional:  Negative for fever, malaise/fatigue and weight loss.  HENT: Negative.  Negative for nosebleeds.   Eyes: Negative.  Negative for blurred vision, double vision and photophobia.  Respiratory: Negative.  Negative for cough and shortness of breath.   Cardiovascular: Negative.  Negative for chest pain, palpitations and leg swelling.  Gastrointestinal:  Positive for diarrhea, nausea and vomiting. Negative for heartburn.  Musculoskeletal: Negative.  Negative for myalgias.  Neurological: Negative.  Negative for dizziness, focal weakness, seizures and headaches.  Psychiatric/Behavioral: Negative.  Negative for suicidal ideas.     Past Medical History:  Diagnosis Date   Admission for sterilization 06/12/2013   Asthma    Breast abscess    Depression    HX of - not on med n, doing  well.   Eczema    Family history of anesthesia complication    Grandmother has difficulty waskin up.   Hypertension    Pneumonia    HX only -years ago has had a few times.   Pregnant state, incidental    S/P tubal ligation 06/13/2013   SVD (spontaneous vaginal delivery)    x 2    Past Surgical History:  Procedure Laterality Date   INCISE AND DRAIN ABCESS     left breast x 2   INCISION AND DRAINAGE ABSCESS Left 02/17/2012   Procedure:  DRAINAGEof recurrent breast abscess;  Surgeon:  Vicenta DELENA Poli, MD;  Location: [facility] OR;  Service: General;  Laterality: Left;   LAPAROSCOPIC TUBAL LIGATION Bilateral 06/13/2013   Procedure: LAPAROSCOPIC TUBAL LIGATION;  Surgeon: Ezzie Buba, MD;  Location: [facility];  Service: Gynecology;  Laterality: Bilateral;   TUBAL LIGATION      Family History  Problem Relation Age of Onset   Healthy Mother    Anxiety disorder Mother    Healthy Father    Stroke Maternal Grandmother    Stroke Paternal Grandfather    Bipolar disorder Neg Hx    Depression Neg Hx     Social History Reviewed with no changes to be made today.   Outpatient Medications Prior to Visit  Medication Sig Dispense Refill   albuterol  (VENTOLIN  HFA) 108 (90 Base) MCG/ACT inhaler TAKE 2 PUFFS BY MOUTH EVERY 6 HOURS AS NEEDED FOR WHEEZE OR SHORTNESS OF BREATH 8.5 each 0   clobetasol  ointment (TEMOVATE ) 0.05 % Apply topically twice daily to BODY as needed for SEVERE red, sandpaper and thickened like rash.  Do not use on face, groin or armpits.  Use for up to two weeks at a time. 60 g 3   hydrocortisone  2.5 % ointment Apply topically 2 (two) times daily as needed (okay to use on the face/neck. Don't use more than 7 days in a row.). 30 g 2   triamcinolone  ointment (KENALOG ) 0.1 % Apply 1 Application topically 2 (two) times daily as needed (rash flare). Do not use on the face, neck, armpits or groin area. Do not use more than 3 weeks in a row. 30 g 2   amLODipine  (NORVASC ) 5 MG tablet Take 5 mg by mouth daily.     albuterol  (PROVENTIL ) (2.5 MG/3ML) 0.083% nebulizer solution Inhalation (Patient not taking: Reported on 11/05/2023)     azithromycin  (ZITHROMAX ) 250 MG tablet Oral (Patient not taking: Reported on 11/05/2023)     cetirizine  (ZYRTEC ) 10 MG tablet Take 1 tablet (10 mg total) by mouth daily. 30 tablet 0   cyclobenzaprine  (FLEXERIL ) 10 MG tablet Take 1 tablet (10 mg total) by mouth at bedtime. (Patient not taking: Reported on 11/05/2023) 10 tablet 0   FLUoxetine  (PROZAC )  20 MG capsule Oral (Patient not taking: Reported on 11/05/2023)     HYDROcodone -acetaminophen  (NORCO/VICODIN) 5-325 MG tablet Oral (Patient not taking: Reported on 11/05/2023)     hydrOXYzine  (ATARAX ) 50 MG tablet Take 1 tablet (50 mg total) by mouth every 8 (eight) hours as needed. (Patient not taking: Reported on 11/05/2023) 270 tablet 1   hydrOXYzine  (VISTARIL ) 25 MG capsule Oral (Patient not taking: Reported on 11/05/2023)     ibuprofen  (ADVIL ) 800 MG tablet Oral     metroNIDAZOLE (FLAGYL) 500 MG tablet Oral (Patient not taking: Reported on 11/05/2023)     mometasone -formoterol  (DULERA ) 200-5 MCG/ACT AERO Inhalation (Patient not taking: Reported on 11/05/2023)     montelukast  (SINGULAIR ) 10 MG  tablet Oral (Patient not taking: Reported on 11/05/2023)     NEMLUVIO  30 MG SQ injection Inject 30 mg into the skin every 28 (twenty-eight) days. 2 each 0   oxyCODONE -acetaminophen  (PERCOCET/ROXICET) 5-325 MG tablet Oral (Patient not taking: Reported on 11/05/2023)     terbinafine  (LAMISIL ) 250 MG tablet Oral (Patient not taking: Reported on 11/05/2023)     Vitamin D , Ergocalciferol , (DRISDOL ) 1.25 MG (50000 UNIT) CAPS capsule Take 1 capsule (50,000 Units total) by mouth every 7 (seven) days. (Patient not taking: Reported on 11/05/2023) 16 capsule 2   No facility-administered medications prior to visit.    Allergies[1]     Objective:    BP (!) 157/98 (BP Location: Left Arm, Patient Position: Sitting, Cuff Size: Normal)   Pulse (!) 107   Ht 5' 0.75 (1.543 m)   Wt 220 lb 3.2 oz (99.9 kg)   LMP 01/14/2024 (Approximate)   SpO2 98%   BMI 41.95 kg/m  Wt Readings from Last 3 Encounters:  01/30/24 220 lb 3.2 oz (99.9 kg)  10/30/23 225 lb 1.4 oz (102.1 kg)  08/29/23 225 lb (102.1 kg)    Physical Exam Vitals and nursing note reviewed.  Constitutional:      Appearance: She is well-developed.  HENT:     Head: Normocephalic and atraumatic.  Cardiovascular:     Rate and Rhythm: Normal rate and regular  rhythm.     Heart sounds: Normal heart sounds. No murmur heard.    No friction rub. No gallop.  Pulmonary:     Effort: Pulmonary effort is normal. No tachypnea or respiratory distress.     Breath sounds: Normal breath sounds. No decreased breath sounds, wheezing, rhonchi or rales.  Chest:     Chest wall: No tenderness.  Musculoskeletal:        General: Normal range of motion.     Cervical back: Normal range of motion.  Skin:    General: Skin is warm and dry.  Neurological:     Mental Status: She is alert and oriented to person, place, and time.     Coordination: Coordination normal.  Psychiatric:        Behavior: Behavior normal. Behavior is cooperative.        Thought Content: Thought content normal.        Judgment: Judgment normal.          Patient has been counseled extensively about nutrition and exercise as well as the importance of adherence with medications and regular follow-up. The patient was given clear instructions to go to ER or return to medical center if symptoms don't improve, worsen or new problems develop. The patient verbalized understanding.   Follow-up: Return in about 3 weeks (around 02/20/2024) for BP recheck video visit or office visit. Patient choice.   Haze LELON Servant, FNP-BC [facility] [place], [place] [phone]   01/30/2024, 4:16 PM     [1]  Allergies Allergen Reactions   Doxycycline  Photosensitivity    Other Reaction(s): Unknown   "

## 2024-01-30 NOTE — Progress Notes (Signed)
 Severe loss of appetite.  Nausea and vomiting.  Symptoms started be fore thanksgiving.

## 2024-01-31 LAB — CMP14+EGFR
ALT: 154 [IU]/L — ABNORMAL HIGH (ref 0–32)
AST: 134 [IU]/L — ABNORMAL HIGH (ref 0–40)
Albumin: 4.4 g/dL (ref 3.9–4.9)
Alkaline Phosphatase: 94 [IU]/L (ref 41–116)
BUN/Creatinine Ratio: 8 — ABNORMAL LOW (ref 9–23)
BUN: 6 mg/dL (ref 6–20)
Bilirubin Total: 0.6 mg/dL (ref 0.0–1.2)
CO2: 25 mmol/L (ref 20–29)
Calcium: 9.5 mg/dL (ref 8.7–10.2)
Chloride: 95 mmol/L — ABNORMAL LOW (ref 96–106)
Creatinine, Ser: 0.78 mg/dL (ref 0.57–1.00)
Globulin, Total: 2.4 g/dL (ref 1.5–4.5)
Glucose: 116 mg/dL — ABNORMAL HIGH (ref 70–99)
Potassium: 4.3 mmol/L (ref 3.5–5.2)
Sodium: 137 mmol/L (ref 134–144)
Total Protein: 6.8 g/dL (ref 6.0–8.5)
eGFR: 100 mL/min/{1.73_m2}

## 2024-01-31 LAB — CBC WITH DIFFERENTIAL/PLATELET
Basophils Absolute: 0.1 10*3/uL (ref 0.0–0.2)
Basos: 1 %
EOS (ABSOLUTE): 0.6 10*3/uL — ABNORMAL HIGH (ref 0.0–0.4)
Eos: 6 %
Hematocrit: 45.9 % (ref 34.0–46.6)
Hemoglobin: 15.6 g/dL (ref 11.1–15.9)
Immature Grans (Abs): 0 10*3/uL (ref 0.0–0.1)
Immature Granulocytes: 0 %
Lymphocytes Absolute: 1.8 10*3/uL (ref 0.7–3.1)
Lymphs: 18 %
MCH: 34.7 pg — ABNORMAL HIGH (ref 26.6–33.0)
MCHC: 34 g/dL (ref 31.5–35.7)
MCV: 102 fL — ABNORMAL HIGH (ref 79–97)
Monocytes Absolute: 0.6 10*3/uL (ref 0.1–0.9)
Monocytes: 6 %
Neutrophils Absolute: 6.8 10*3/uL (ref 1.4–7.0)
Neutrophils: 69 %
Platelets: 342 10*3/uL (ref 150–450)
RBC: 4.5 x10E6/uL (ref 3.77–5.28)
RDW: 12.9 % (ref 11.7–15.4)
WBC: 9.8 10*3/uL (ref 3.4–10.8)

## 2024-01-31 LAB — THYROID PANEL WITH TSH
Free Thyroxine Index: 1.4 (ref 1.2–4.9)
T3 Uptake Ratio: 22 % — ABNORMAL LOW (ref 24–39)
T4, Total: 6.4 ug/dL (ref 4.5–12.0)
TSH: 5.3 u[IU]/mL — ABNORMAL HIGH (ref 0.450–4.500)

## 2024-02-04 ENCOUNTER — Ambulatory Visit: Payer: Self-pay | Admitting: Nurse Practitioner

## 2024-02-04 DIAGNOSIS — R748 Abnormal levels of other serum enzymes: Secondary | ICD-10-CM

## 2024-02-08 ENCOUNTER — Ambulatory Visit: Payer: Self-pay

## 2024-02-08 ENCOUNTER — Other Ambulatory Visit: Payer: Self-pay

## 2024-02-08 ENCOUNTER — Ambulatory Visit: Admitting: Internal Medicine

## 2024-02-08 ENCOUNTER — Encounter: Payer: Self-pay | Admitting: Internal Medicine

## 2024-02-08 VITALS — BP 122/86 | HR 112 | Temp 98.1°F | Resp 18

## 2024-02-08 DIAGNOSIS — J3089 Other allergic rhinitis: Secondary | ICD-10-CM

## 2024-02-08 DIAGNOSIS — L2389 Allergic contact dermatitis due to other agents: Secondary | ICD-10-CM

## 2024-02-08 DIAGNOSIS — L309 Dermatitis, unspecified: Secondary | ICD-10-CM

## 2024-02-08 DIAGNOSIS — H1045 Other chronic allergic conjunctivitis: Secondary | ICD-10-CM

## 2024-02-08 DIAGNOSIS — J4530 Mild persistent asthma, uncomplicated: Secondary | ICD-10-CM

## 2024-02-08 MED ORDER — LEBRIKIZUMAB-LBKZ 250 MG/2ML ~~LOC~~ SOAJ
250.0000 mg | Freq: Once | SUBCUTANEOUS | Status: AC
  Administered 2024-02-08: 250 mg via SUBCUTANEOUS

## 2024-02-08 NOTE — Progress Notes (Unsigned)
 "  FOLLOW UP Date of Service/Encounter:  02/08/24  Subjective:  [name] [name] (DOB: 02/17/1985) is a 39 y.o. female who [facility] in re-evaluation of the following: severe eczema, asthma, rhinitis  History obtained from: chart review and patient.  For Review, LV was on5/30/25 with Dr. Lorin seen for routine follow-up. See below for summary of history and diagnostics.    Therapeutic plans/changes recommended: FEV1: 2.12L, 77% predicted  Today presents for follow-up. Discussed the use of AI scribe software for clinical note transcription with the patient, who gave verbal consent to proceed.  History of Present Illness [name] [name] is a 39 year old female with eczema and asthma who presents for follow-up on her current treatment regimen.  She has a history of eczema and was previously on Dupixent , which she felt was losing its effectiveness. She was then prescribed Nemluvio  at last visit and vtama  for flares on face, which initially controlled her symptoms with a reduction in itching and rashes.However she continued to have issues with nemluvios device (needles not coming out, medicine not fully dispensed).  Due to device delivery failure she feels like nemluvio  has not helped.  She would like to switch back to Dupixent  OR other biologic.  She continues to use triamcinolone  daily.  Flaring on face, feet, trunk and hands.  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  She has had to stop using Mr. Clean due to blistering rash on hands.  Feels  like other products may be contributing to eczema as well   Asthma has been well controlled. Has not need air supra. No OCS/ED/UC visits since last viist.   Rhinitis has been well controlled. No needing zyrtec  of flonase  at all due to lack of nasal symptoms         All medications reviewed by clinical staff and updated in  chart. No new pertinent medical or surgical history except as noted in HPI.  ROS: All others negative except as noted per HPI.   Objective:  BP 122/86 (BP Location: Left Arm, Patient Position: Sitting, Cuff Size: Large)   Pulse (!) 112   Temp 98.1 F (36.7 C) (Temporal)   Resp 18   LMP 01/14/2024 (Approximate)   SpO2 95%  There is no height or weight on file to calculate BMI. Physical Exam: General Appearance:  Alert, cooperative, no distress, appears stated age  Head:  Normocephalic, without obvious abnormality, atraumatic  Eyes:  Conjunctiva clear, EOM's intact  Ears Flaking on external ear, no eczematous patches , normal TMs bilaterally, and ear tubes present bilaterally without exudate  Nose: Nares normal, normal mucosa, no visible anterior polyps, and septum midline  Throat: Lips, tongue normal; teeth and gums normal, normal posterior oropharynx  Neck: Supple, symmetrical  Lungs:   clear to auscultation bilaterally, Respirations unlabored, no coughing  Heart:  regular rate and rhythm and no murmur, Appears well perfused  Extremities: No edema  Skin: Mild xerosis of ears  and no rashes or lesions on visualized portions of skin  Neurologic: No gross deficits   Labs:  Lab Orders  No laboratory test(s) ordered today    Spirometry:  Tracings reviewed. Her effort: Good reproducible efforts. FVC: 2.56L FEV1: 2.03L, 74% predicted FEV1/FVC ratio: 79% Interpretation: Spirometry consistent with possible restrictive disease. - at baseline  Please see scanned spirometry results for details.   Assessment/Plan   Patient Instructions  Severe Eczema Medications: START ebglyss : 500mg  today given as initial loading dose, and week 2, then 250mg  every 2 weeks until 16 weeks.  Can switch to every 4 weeks if cleared then   Topicals:  Face: Only apply to affected areas that are rough and red Face: hydrocortisone  2.5% ointment twice daily as needed to affected areas (up to 7 days in  a row); stop when clear and can restart as needed for flares. Use Vtama  cream once a day as needed on eczema flares.   Body:  Use triamcinolone  0.1% ointment twice a day as needed for rash flares. Do not use on the face, neck, armpits or groin area. Do not use more than 3 weeks in a row.  Moisturizer: Triamcinolone -Eucerin once day. For more than twice a day use the following: Aquaphor, Vaseline, Cerave, Cetaphil, Eucerin, Vanicream.  Itching: Take Zyrtec  10mg  in the morning Take hydroxyzine  50mg  1 hour before bed  Keep track of rashes and take pictures. Write down what you had done/eaten during flares.  See below for proper skin care. Use fragrance free and dye free products. No dryer sheets or fabric softener.    Avoid hair dyes.  We need to do patch testing once skin is better controlled   Other allergic rhinitis, well controlled  Use Flonase  (fluticasone ) nasal spray 1-2 sprays per nostril once a day as needed for nasal congestion.  Nasal saline spray (i.e., Simply Saline) or nasal saline lavage (i.e., NeilMed) is recommended as needed and prior to medicated nasal sprays. Use over the counter antihistamines such as Zyrtec  (cetirizine ), Claritin  (loratadine ), Allegra (fexofenadine), or Xyzal  (levocetirizine) daily as needed. May take twice a day during allergy flares. May switch antihistamines every few months.  Asthma, well controlled  Breathing test looked great!  May use Airsupra  rescue inhaler 2 puffs every 4 to 6 hours as needed for shortness of breath, chest tightness, coughing, and wheezing. Do not use more than 12 puffs in 24 hours. May use Airsupra  rescue inhaler 2 puffs 5 to 15 minutes prior to strenuous physical activities. Rinse mouth after each use.  Monitor frequency of use - if you need to use it more than twice per week on a consistent basis let us  know.    Call in 3 months: if skin is clear we need to schedule patch testing.  If skin is not clear I want to see you  in clinic   Patches are best placed on Monday with return to office on Wednesday and Friday of same week for readings.  Patches once placed should not get wet.  You do not have to stop any medications for patch testing but should not be on oral prednisone . You can schedule a patch testing visit when convenient for your schedule.    Skin care recommendations  Bath time: Always use lukewarm water. AVOID very hot or cold water. Keep bathing time to 5-10 minutes. Do NOT use bubble bath. Use a mild soap and use just enough to wash the dirty areas. Do NOT scrub skin vigorously.  After bathing, pat dry your skin with a towel. Do NOT rub or scrub the skin.  Moisturizers and prescriptions:  ALWAYS apply moisturizers immediately after bathing (within 3 minutes). This helps to lock-in moisture. Use the moisturizer several times a day over the whole body. Good summer moisturizers include: Aveeno, CeraVe, Cetaphil. Good winter moisturizers include: Aquaphor, Vaseline, Cerave, Cetaphil, Eucerin, Vanicream. When using moisturizers along with medications, the moisturizer should be applied about one hour  after applying the medication to prevent diluting effect of the medication or moisturize around where you applied the medications. When not using medications, the moisturizer can be continued twice daily as maintenance.  Laundry and clothing: Avoid laundry products with added color or perfumes. Use unscented hypo-allergenic laundry products such as Tide free, Cheer free & gentle, and All free and clear.  If the skin still seems dry or sensitive, you can try double-rinsing the clothes. Avoid tight or scratchy clothing such as wool. Do not use fabric softeners or dyer sheets.  Other:    Thank you so much for letting me partake in your care today.  Don't hesitate to reach out if you have any additional concerns!  Hargis Springer, MD  [facility] [place], [place]        "

## 2024-02-08 NOTE — Patient Instructions (Addendum)
 Severe Eczema Medications: START ebglyss : 500mg  today given as initial loading dose, and week 2, then 250mg  every 2 weeks until 16 weeks.  Can switch to every 4 weeks if cleared then   Topicals:  Face: Only apply to affected areas that are rough and red Face: hydrocortisone  2.5% ointment twice daily as needed to affected areas (up to 7 days in a row); stop when clear and can restart as needed for flares. Use Vtama  cream once a day as needed on eczema flares.   Body:  Use triamcinolone  0.1% ointment twice a day as needed for rash flares. Do not use on the face, neck, armpits or groin area. Do not use more than 3 weeks in a row.  Moisturizer: Triamcinolone -Eucerin once day. For more than twice a day use the following: Aquaphor, Vaseline, Cerave, Cetaphil, Eucerin, Vanicream.  Itching: Take Zyrtec  10mg  in the morning Take hydroxyzine  50mg  1 hour before bed  Keep track of rashes and take pictures. Write down what you had done/eaten during flares.  See below for proper skin care. Use fragrance free and dye free products. No dryer sheets or fabric softener.    Avoid hair dyes.  We need to do patch testing once skin is better controlled   Other allergic rhinitis, well controlled  Use Flonase  (fluticasone ) nasal spray 1-2 sprays per nostril once a day as needed for nasal congestion.  Nasal saline spray (i.e., Simply Saline) or nasal saline lavage (i.e., NeilMed) is recommended as needed and prior to medicated nasal sprays. Use over the counter antihistamines such as Zyrtec  (cetirizine ), Claritin  (loratadine ), Allegra (fexofenadine), or Xyzal  (levocetirizine) daily as needed. May take twice a day during allergy flares. May switch antihistamines every few months.  Asthma, well controlled  Breathing test looked great!  May use Airsupra  rescue inhaler 2 puffs every 4 to 6 hours as needed for shortness of breath, chest tightness, coughing, and wheezing. Do not use more than 12 puffs in 24  hours. May use Airsupra  rescue inhaler 2 puffs 5 to 15 minutes prior to strenuous physical activities. Rinse mouth after each use.  Monitor frequency of use - if you need to use it more than twice per week on a consistent basis let us  know.    Call in 3 months: if skin is clear we need to schedule patch testing.  If skin is not clear I want to see you in clinic   Patches are best placed on Monday with return to office on Wednesday and Friday of same week for readings.  Patches once placed should not get wet.  You do not have to stop any medications for patch testing but should not be on oral prednisone . You can schedule a patch testing visit when convenient for your schedule.    Skin care recommendations  Bath time: Always use lukewarm water. AVOID very hot or cold water. Keep bathing time to 5-10 minutes. Do NOT use bubble bath. Use a mild soap and use just enough to wash the dirty areas. Do NOT scrub skin vigorously.  After bathing, pat dry your skin with a towel. Do NOT rub or scrub the skin.  Moisturizers and prescriptions:  ALWAYS apply moisturizers immediately after bathing (within 3 minutes). This helps to lock-in moisture. Use the moisturizer several times a day over the whole body. Good summer moisturizers include: Aveeno, CeraVe, Cetaphil. Good winter moisturizers include: Aquaphor, Vaseline, Cerave, Cetaphil, Eucerin, Vanicream. When using moisturizers along with medications, the moisturizer should be applied about one hour after  applying the medication to prevent diluting effect of the medication or moisturize around where you applied the medications. When not using medications, the moisturizer can be continued twice daily as maintenance.  Laundry and clothing: Avoid laundry products with added color or perfumes. Use unscented hypo-allergenic laundry products such as Tide free, Cheer free & gentle, and All free and clear.  If the skin still seems dry or sensitive, you can try  double-rinsing the clothes. Avoid tight or scratchy clothing such as wool. Do not use fabric softeners or dyer sheets.

## 2024-02-20 ENCOUNTER — Ambulatory Visit: Payer: Self-pay | Admitting: Nurse Practitioner

## 2024-02-27 ENCOUNTER — Ambulatory Visit: Admitting: Nurse Practitioner
